# Patient Record
Sex: Female | Born: 1995
Health system: Southern US, Community
[De-identification: ages and names within clinical notes are randomized; demographics above are authoritative.]

## PROBLEM LIST (undated history)

## (undated) ENCOUNTER — Inpatient Hospital Stay (HOSPITAL_COMMUNITY): Payer: Self-pay

## (undated) ENCOUNTER — Ambulatory Visit (HOSPITAL_COMMUNITY): Admission: EM | Payer: No Typology Code available for payment source

## (undated) DIAGNOSIS — D649 Anemia, unspecified: Secondary | ICD-10-CM

## (undated) DIAGNOSIS — R87629 Unspecified abnormal cytological findings in specimens from vagina: Secondary | ICD-10-CM

## (undated) DIAGNOSIS — F329 Major depressive disorder, single episode, unspecified: Secondary | ICD-10-CM

## (undated) DIAGNOSIS — F1910 Other psychoactive substance abuse, uncomplicated: Secondary | ICD-10-CM

## (undated) DIAGNOSIS — F913 Oppositional defiant disorder: Secondary | ICD-10-CM

## (undated) DIAGNOSIS — F4310 Post-traumatic stress disorder, unspecified: Secondary | ICD-10-CM

## (undated) DIAGNOSIS — F419 Anxiety disorder, unspecified: Secondary | ICD-10-CM

## (undated) DIAGNOSIS — J302 Other seasonal allergic rhinitis: Secondary | ICD-10-CM

## (undated) DIAGNOSIS — F32A Depression, unspecified: Secondary | ICD-10-CM

## (undated) DIAGNOSIS — A630 Anogenital (venereal) warts: Secondary | ICD-10-CM

## (undated) HISTORY — DX: Major depressive disorder, single episode, unspecified: F32.9

## (undated) HISTORY — PX: DENTAL SURGERY: SHX609

## (undated) HISTORY — DX: Oppositional defiant disorder: F91.3

## (undated) HISTORY — DX: Unspecified abnormal cytological findings in specimens from vagina: R87.629

## (undated) HISTORY — DX: Anogenital (venereal) warts: A63.0

## (undated) HISTORY — DX: Anxiety disorder, unspecified: F41.9

## (undated) HISTORY — DX: Anemia, unspecified: D64.9

## (undated) HISTORY — DX: Depression, unspecified: F32.A

## (undated) NOTE — *Deleted (*Deleted)
NURSING PROGRESS NOTE  Mahlet Jergens [id number] Admission Data: 07/05/2020 12:54 AM Attending Provider: Earl Lagos, MD WUJ:[id number], Gennie Alma, MD (Inactive) Code Status: Full  Gabrielle Nguyen is a 51 y.o. female patient admitted from ED:  -No acute distress noted.  -No complaints of shortness of breath.  -No complaints of chest pain.   Cardiac Monitoring: Placed on a progressive monitor Cardiac monitor yields:sinus tachycardia.  Blood pressure 116/77, pulse 96, temperature 98.7 F (37.1 C), temperature source Oral, resp. rate (!) 23, height 5\' 7"  (1.702 m), weight 57.2 kg, SpO2 98 %, unknown if currently breastfeeding.   IV Fluids:  IV in place, occlusive dsg intact without redness, IV cath forearm right, condition patent and no redness lactated Ringer's.   Allergies:  Patient has no known allergies.  Past Medical History:   has a past medical history of Anemia, Anxiety, Anxiety, Asthma, Depression, HPV (human papilloma virus) anogenital infection, Oppositional defiant disorder, Seasonal allergies, and Vaginal Pap smear, abnormal.  Past Surgical History:   has a past surgical history that includes Dental surgery.  Social History:   reports that she quit smoking about 7 years ago. She has never used smokeless tobacco. She reports that she does not drink alcohol and does not use drugs.  Skin: intac  Patient/Family orientated to room. Information packet given to patient/family. Admission inpatient armband information verified with patient/family to include name and date of birth and placed on patient arm. Side rails up x 2, fall assessment and education completed with patient/family. Patient/family able to verbalize understanding of risk associated with falls and verbalized understanding to call for assistance before getting out of bed. Call light within reach. Patient/family able to voice and demonstrate understanding of unit orientation instructions.    Will continue to evaluate  and treat per MD orders.

---
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
## 2013-07-04 ENCOUNTER — Encounter (HOSPITAL_COMMUNITY): Payer: Self-pay | Admitting: Emergency Medicine

## 2013-07-04 ENCOUNTER — Emergency Department (HOSPITAL_COMMUNITY): Payer: BC Managed Care – PPO

## 2013-07-04 ENCOUNTER — Emergency Department (HOSPITAL_COMMUNITY)
Admission: EM | Admit: 2013-07-04 | Discharge: 2013-07-04 | Disposition: A | Discharge status: Home / Self Care | Payer: BC Managed Care – PPO | Attending: Emergency Medicine | Admitting: Emergency Medicine

## 2013-07-04 DIAGNOSIS — Y929 Unspecified place or not applicable: Secondary | ICD-10-CM | POA: Insufficient documentation

## 2013-07-04 DIAGNOSIS — Z79899 Other long term (current) drug therapy: Secondary | ICD-10-CM | POA: Insufficient documentation

## 2013-07-04 DIAGNOSIS — Z862 Personal history of diseases of the blood and blood-forming organs and certain disorders involving the immune mechanism: Secondary | ICD-10-CM | POA: Insufficient documentation

## 2013-07-04 DIAGNOSIS — J45909 Unspecified asthma, uncomplicated: Secondary | ICD-10-CM | POA: Insufficient documentation

## 2013-07-04 DIAGNOSIS — X58XXXA Exposure to other specified factors, initial encounter: Secondary | ICD-10-CM | POA: Insufficient documentation

## 2013-07-04 DIAGNOSIS — R5381 Other malaise: Secondary | ICD-10-CM | POA: Insufficient documentation

## 2013-07-04 DIAGNOSIS — IMO0002 Reserved for concepts with insufficient information to code with codable children: Secondary | ICD-10-CM | POA: Insufficient documentation

## 2013-07-04 DIAGNOSIS — R569 Unspecified convulsions: Secondary | ICD-10-CM

## 2013-07-04 DIAGNOSIS — R6883 Chills (without fever): Secondary | ICD-10-CM | POA: Insufficient documentation

## 2013-07-04 DIAGNOSIS — F411 Generalized anxiety disorder: Secondary | ICD-10-CM | POA: Insufficient documentation

## 2013-07-04 DIAGNOSIS — F17200 Nicotine dependence, unspecified, uncomplicated: Secondary | ICD-10-CM | POA: Insufficient documentation

## 2013-07-04 DIAGNOSIS — R209 Unspecified disturbances of skin sensation: Secondary | ICD-10-CM | POA: Insufficient documentation

## 2013-07-04 DIAGNOSIS — R51 Headache: Secondary | ICD-10-CM | POA: Insufficient documentation

## 2013-07-04 DIAGNOSIS — Y939 Activity, unspecified: Secondary | ICD-10-CM | POA: Insufficient documentation

## 2013-07-04 HISTORY — DX: Unspecified convulsions: R56.9

## 2013-07-04 LAB — CBC WITH DIFFERENTIAL/PLATELET
Basophils Absolute: 0.1 10*3/uL (ref 0.0–0.1)
Eosinophils Relative: 3 % (ref 0–5)
Lymphocytes Relative: 14 % (ref 12–46)
Lymphs Abs: 1 10*3/uL (ref 0.7–4.0)
MCV: 75 fL — ABNORMAL LOW (ref 78.0–100.0)
Neutro Abs: 5.7 10*3/uL (ref 1.7–7.7)
Neutrophils Relative %: 75 % (ref 43–77)
Platelets: 187 10*3/uL (ref 150–400)
RBC: 4.64 MIL/uL (ref 3.87–5.11)
WBC: 7.6 10*3/uL (ref 4.0–10.5)

## 2013-07-04 LAB — COMPREHENSIVE METABOLIC PANEL
ALT: 9 U/L (ref 0–35)
AST: 17 U/L (ref 0–37)
Alkaline Phosphatase: 46 U/L (ref 39–117)
CO2: 21 mEq/L (ref 19–32)
Chloride: 103 mEq/L (ref 96–112)
GFR calc Af Amer: >90 mL/min (ref >90)
GFR calc non Af Amer: >90 mL/min (ref >90)
Glucose, Bld: 81 mg/dL (ref 70–99)
Potassium: 3.6 mEq/L (ref 3.5–5.1)
Sodium: 137 mEq/L (ref 135–145)

## 2013-07-04 LAB — URINALYSIS, ROUTINE W REFLEX MICROSCOPIC
Bilirubin Urine: NEGATIVE
Glucose, UA: NEGATIVE mg/dL
Ketones, ur: 15 mg/dL — AB
Nitrite: NEGATIVE
Specific Gravity, Urine: 1.025 (ref 1.005–1.030)
Urobilinogen, UA: 0.2 mg/dL (ref 0.0–1.0)
pH: 5 (ref 5.0–8.0)

## 2013-07-04 LAB — ETHANOL: Alcohol, Ethyl (B): <11 mg/dL (ref 0–11)

## 2013-07-04 LAB — RAPID URINE DRUG SCREEN, HOSP PERFORMED: Barbiturates: NOT DETECTED

## 2013-07-04 LAB — URINE MICROSCOPIC-ADD ON

## 2013-07-04 LAB — PREGNANCY, URINE: Preg Test, Ur: NEGATIVE

## 2013-07-04 MED ORDER — SODIUM CHLORIDE 0.9 % IV BOLUS (SEPSIS)
1000.0000 mL | Freq: Once | INTRAVENOUS | Status: AC
  Administered 2013-07-04: 1000 mL via INTRAVENOUS

## 2013-07-04 MED ORDER — LORAZEPAM 2 MG/ML IJ SOLN
1.0000 mg | Freq: Once | INTRAMUSCULAR | Status: AC
  Administered 2013-07-04: 1 mg via INTRAVENOUS
  Filled 2013-07-04: qty 1

## 2013-07-04 NOTE — ED Notes (Signed)
Discharge instructions reviewed with pt. Pt verbalized understanding.   

## 2013-07-04 NOTE — ED Provider Notes (Signed)
CSN: [id number]     Arrival date & time 07/04/13  1450 History   First MD Initiated Contact with Patient 07/04/13 1458     Chief Complaint  Patient presents with  . Seizures   (Consider location/radiation/quality/duration/timing/severity/associated sxs/prior Treatment) The history is provided by the patient and a parent. No language interpreter was used.  Kessler Kopinski is an 17 year old female with past history of asthma, anxiety, depression presenting to the emergency department with allegedly seizure that occurred this afternoon. Patient was brought in by EMS, mother and father accompanied the patient. As per mother's report, reported that while patient and mother were out getting her nails done the patient reported that she felt to the "weird" and had to sit down. Mother reported that as a child was sitting down, mother reported that she noticed that her child started to get stiff in the body and started to convulse. Mother reported that as she was calling the patient's name the patient was not responding, reported that the patient was looking at the mother but not recognizing her. Mother reported that the convulsions and shakes last for approximately 2 minutes, no more than 3 minutes. Mother reported that patient had a post-ictal phase, reported that patient was disoriented, reported that as the patient was on the gerny she tried to buckle herself up. Patient reported that all she is remembers is going to sit down and then being in the EMS bed. Mother reported that patient is currently in her proper state of mind while in the ED setting, mother reported that patient is more like herself. Patient reported that she experienced a similar episode a couple of months ago where she was seen and assessed at [facility] and diagnosed with an anxiety attack. Patient reported that she does smoke marijuana, but has not smoked in a couple of days. Patient reported that she occasionally drinks beer. Patient reported  that she used to be on klonopin for many years, TID, but reported that she no longer has taken the medication in a couple of weeks - reported that she was taken off of it. Patient reported that she is now experiencing a headache, tingling sensations all over her body, and generalized weakness. Reported mid tongue numbness where she bit her tongue. Denied fall, head injuries, chest pain, shortness of breath, difficulty breathing, visual changes, nausea, vomiting, diarrhea, abdominal pain, fever, chills, sweats, neck pain, neck stiffness, cough, nasal congestion. PCP Dr. Regan Lemming    Mother reported that she had one episode of seizure when she was younger, was never diagnosed with epilepsy.  Past Medical History  Diagnosis Date  . Asthma   . Anxiety   . Oppositional defiant disorder   . Depression   . Seasonal allergies   . Anxiety   . Anemia    History reviewed. No pertinent past surgical history. Family History  Problem Relation Age of Onset  . Bipolar disorder Father   . Alcohol abuse Father   . Drug abuse Father   . Anxiety disorder Maternal Grandmother    History  Substance Use Topics  . Smoking status: Current Every Day Smoker  . Smokeless tobacco: Never Used  . Alcohol Use: 2.4 oz/week    4 Shots of liquor per week   OB History   Grav Para Term Preterm Abortions TAB SAB Ect Mult Living   0 0 0 0 0 0 0 0 0 0      Review of Systems  Constitutional: Positive for chills. Negative for fever.  Eyes:  Negative for visual disturbance.  Respiratory: Negative for chest tightness and shortness of breath.   Cardiovascular: Negative for chest pain.  Gastrointestinal: Negative for nausea, vomiting and abdominal pain.  Musculoskeletal: Negative for back pain, neck pain and neck stiffness.  Neurological: Positive for seizures, weakness and headaches.  All other systems reviewed and are negative.    Allergies  Review of patient's allergies indicates no known allergies.  Home  Medications   Current Outpatient Rx  Name  Route  Sig  Dispense  Refill  . acetaminophen (TYLENOL) 325 MG tablet   Oral   Take 650 mg by mouth every 6 (six) hours as needed. For headache          . albuterol (PROVENTIL HFA;VENTOLIN HFA) 108 (90 BASE) MCG/ACT inhaler   Inhalation   Inhale 2 puffs into the lungs every 6 (six) hours as needed. For shortness of breath          . albuterol (PROVENTIL) (2.5 MG/3ML) 0.083% nebulizer solution   Nebulization   Take 6 mLs (5 mg total) by nebulization every 6 (six) hours as needed for wheezing.   75 mL   12   . clonazePAM (KLONOPIN) 0.5 MG tablet   Oral   Take 0.5 mg by mouth 3 (three) times daily as needed for anxiety.         . diphenhydrAMINE (SOMINEX) 25 MG tablet   Oral   Take 25 mg by mouth at bedtime as needed for itching or sleep.         . drospirenone-ethinyl estradiol (YAZ,GIANVI,LORYNA) 3-0.02 MG tablet   Oral   Take 1 tablet by mouth daily.   1 Package   3   . hydrocortisone cream 1 %   Topical   Apply 1 application topically as needed. For itching/dry skin         . ibuprofen (ADVIL,MOTRIN) 200 MG tablet   Oral   Take 600 mg by mouth every 6 (six) hours as needed. For pain/fever           BP 105/54  Pulse 86  Temp(Src) 98.1 F (36.7 C) (Oral)  Resp 16  SpO2 99%  LMP 06/03/2013 Physical Exam  Nursing note and vitals reviewed. Constitutional: She is oriented to person, place, and time. She appears well-developed and well-nourished. No distress.  HENT:  Head: Normocephalic and atraumatic.  Mouth/Throat: Oropharynx is clear and moist. No oropharyngeal exudate.  Abrasion noted to the left side of tongue, tongue trauma identified  Eyes: Conjunctivae and EOM are normal. Pupils are equal, round, and reactive to light. Right eye exhibits no discharge. Left eye exhibits no discharge.  Neck: Normal range of motion. Neck supple. No tracheal deviation present.  Negative neck stiffness Negative nuchal  rigidity Negative cervical lymphadenopathy Full range of motion without difficulty Negative pain upon palpation to C-spine  Cardiovascular: Normal rate, regular rhythm and normal heart sounds.  Exam reveals no friction rub.   No murmur heard. Pulses:      Radial pulses are 2+ on the right side, and 2+ on the left side.       Dorsalis pedis pulses are 2+ on the right side, and 2+ on the left side.  Pulmonary/Chest: Effort normal and breath sounds normal. No respiratory distress. She has no wheezes. She has no rales.  Abdominal: Soft. Bowel sounds are normal. She exhibits no distension. There is no tenderness. There is no rebound.  Musculoskeletal: Normal range of motion.  Full range of motion to upper  and lower extremities bilaterally with no difficulty identified  Lymphadenopathy:    She has no cervical adenopathy.  Neurological: She is alert and oriented to person, place, and time. No cranial nerve deficit or sensory deficit. She exhibits normal muscle tone. Coordination normal. GCS eye subscore is 4. GCS verbal subscore is 5. GCS motor subscore is 6.  Cranial nerves II through XII grossly intact Strength 5+/5+ to upper and lower extremities bilaterally with resistance applied, equal distribution identified Sensation intact with differentiation to sharp and dull touch to upper and lower extremities bilaterally Follows commands Responds to questions appropriately Patient knows where she is, who she is, you, president, state, city, etc.  Patient is aware as to what went on today  Skin: Skin is warm and dry. No rash noted. She is not diaphoretic. No erythema.  Psychiatric: She has a normal mood and affect. Her behavior is normal. Thought content normal.    ED Course  Procedures (including critical care time)  This provider reviewed patient's chart. There are no charts identifying patient being assessed at [facility] for alleged seizure. Patient has been seen in behavioral health hospital  numerous times.  7:09 PM This provider had a long discussion with the family and patient regarding labs and imaging results. Discussed with patient regarding recreational medications that can lead to seizure-like activity if used can cause complications. Discussed with family regarding calcification on the CT scan - discussed that this is chronic and that this is less likely leading to the cause of the seizure. Discussed with patient to stay away from recreational drugs since she is petite can react with her body and lead to seizures. Recommended patient to follow-up with neurology and for an MRI and EEG to be performed.  Labs Review Labs Reviewed  CBC WITH DIFFERENTIAL - Abnormal; Notable for the following:    Hemoglobin 11.3 (*)    HCT 34.8 (*)    MCV 75.0 (*)    MCH 24.4 (*)    RDW 19.5 (*)    All other components within normal limits  URINE RAPID DRUG SCREEN (HOSP PERFORMED) - Abnormal; Notable for the following:    Benzodiazepines POSITIVE (*)    Amphetamines POSITIVE (*)    Tetrahydrocannabinol POSITIVE (*)    All other components within normal limits  URINALYSIS, ROUTINE W REFLEX MICROSCOPIC - Abnormal; Notable for the following:    APPearance CLOUDY (*)    Ketones, ur 15 (*)    Protein, ur 100 (*)    All other components within normal limits  URINE MICROSCOPIC-ADD ON - Abnormal; Notable for the following:    Squamous Epithelial / LPF MANY (*)    All other components within normal limits  COMPREHENSIVE METABOLIC PANEL  ETHANOL  PREGNANCY, URINE  GLUCOSE, CAPILLARY   Imaging Review No results found.  Results for orders placed during the hospital encounter of 07/04/13  CBC WITH DIFFERENTIAL      Result Value Range   WBC 7.6  4.0 - 10.5 K/uL   RBC 4.64  3.87 - 5.11 MIL/uL   Hemoglobin 11.3 (*) 12.0 - 15.0 g/dL   HCT 16.1 (*) 09.6 - 04.5 %   MCV 75.0 (*) 78.0 - 100.0 fL   MCH 24.4 (*) 26.0 - 34.0 pg   MCHC 32.5  30.0 - 36.0 g/dL   RDW 40.9 (*) 81.1 - 91.4 %   Platelets  187  150 - 400 K/uL   Neutrophils Relative % 75  43 - 77 %  Neutro Abs 5.7  1.7 - 7.7 K/uL   Lymphocytes Relative 14  12 - 46 %   Lymphs Abs 1.0  0.7 - 4.0 K/uL   Monocytes Relative 8  3 - 12 %   Monocytes Absolute 0.6  0.1 - 1.0 K/uL   Eosinophils Relative 3  0 - 5 %   Eosinophils Absolute 0.2  0.0 - 0.7 K/uL   Basophils Relative 1  0 - 1 %   Basophils Absolute 0.1  0.0 - 0.1 K/uL  COMPREHENSIVE METABOLIC PANEL      Result Value Range   Sodium 137  135 - 145 mEq/L   Potassium 3.6  3.5 - 5.1 mEq/L   Chloride 103  96 - 112 mEq/L   CO2 21  19 - 32 mEq/L   Glucose, Bld 81  70 - 99 mg/dL   BUN 14  6 - 23 mg/dL   Creatinine, Ser 4.54  0.50 - 1.10 mg/dL   Calcium 9.7  8.4 - 09.8 mg/dL   Total Protein 7.5  6.0 - 8.3 g/dL   Albumin 4.2  3.5 - 5.2 g/dL   AST 17  0 - 37 U/L   ALT 9  0 - 35 U/L   Alkaline Phosphatase 46  39 - 117 U/L   Total Bilirubin 0.3  0.3 - 1.2 mg/dL   GFR calc non Af Amer >90  >90 mL/min   GFR calc Af Amer >90  >90 mL/min  URINE RAPID DRUG SCREEN (HOSP PERFORMED)      Result Value Range   Opiates NONE DETECTED  NONE DETECTED   Cocaine NONE DETECTED  NONE DETECTED   Benzodiazepines POSITIVE (*) NONE DETECTED   Amphetamines POSITIVE (*) NONE DETECTED   Tetrahydrocannabinol POSITIVE (*) NONE DETECTED   Barbiturates NONE DETECTED  NONE DETECTED  ETHANOL      Result Value Range   Alcohol, Ethyl (B) <11  0 - 11 mg/dL  PREGNANCY, URINE      Result Value Range   Preg Test, Ur NEGATIVE  NEGATIVE  URINALYSIS, ROUTINE W REFLEX MICROSCOPIC      Result Value Range   Color, Urine YELLOW  YELLOW   APPearance CLOUDY (*) CLEAR   Specific Gravity, Urine 1.025  1.005 - 1.030   pH 5.0  5.0 - 8.0   Glucose, UA NEGATIVE  NEGATIVE mg/dL   Hgb urine dipstick NEGATIVE  NEGATIVE   Bilirubin Urine NEGATIVE  NEGATIVE   Ketones, ur 15 (*) NEGATIVE mg/dL   Protein, ur 119 (*) NEGATIVE mg/dL   Urobilinogen, UA 0.2  0.0 - 1.0 mg/dL   Nitrite NEGATIVE  NEGATIVE   Leukocytes, UA  NEGATIVE  NEGATIVE  URINE MICROSCOPIC-ADD ON      Result Value Range   Squamous Epithelial / LPF MANY (*) RARE   Bacteria, UA RARE  RARE  GLUCOSE, CAPILLARY      Result Value Range   Glucose-Capillary 82  70 - 99 mg/dL    EKG Interpretation     Ventricular Rate:  95 PR Interval:  147 QRS Duration: 99 QT Interval:  365 QTC Calculation: 459 R Axis:   -3 Text Interpretation:  Sinus rhythm No Injury No ectopy            MDM   1. Seizures    Patient presenting to the ED with alleged first time seizures. As per mother, patient was fine getting nails done and seizure episode occurred suddenly - where patient was at first stiff and  then had convulsions - as per mother. Mother reported that it lasted no longer than 3 minutes - reported that patient had a post-ictal phase afterwards. Patient reported that she had an episode a little while ago and stated that she was diagnosed as having an anxiety attack. Alert and oriented. Cranial nerves grossly intact. Strength 5+/5+ to upper and lower extremities bilaterally with equal distribution. Full ROM to upper and lower extremities bilaterally. Negative neurological deficits noted. Small abrasion to the right side of the tongue secondary to biting. Eye exam unremarkable.  EKG negative ischemic findings noted. CBG 82. Urine pregnancy negative. Urinalysis negative for infection - negative nitrites, negative leukocytes. CBC negative elevation in WBC - negative leukocytosis. Trend seen in low Hgb and Hct. CMP negative findings. Alcohol negative elevation. Chest xray no cardiopulmonary disease noted. Urine drug screen noted positive benzos, amphetamines, and cannabis. CT head suspected intra-axial left occipital brain calcification - no surrounding edema - possibly old finding. Discussed this with attending physician - attending reported that nothing acute, that okay for patient to be discharged and for patient to be followed with a MRI. Patient did  not have any seizures while in the ED setting. Patient controlled and doing well while in the ED setting. Labs and imaging unremarkable. Patient cleared for discharge. Discharged patient with Neurology follow-up - discussed with patient to get MRI and EEG performed. Discussed with patient to rest and stay hydrated. Discussed with patient to avoid recreational medications/drugs outside for these can stimulate and cause seizures. Discussed with patient to continue to monitor symptoms and if symptoms are to worsen or change to report back to the ED - strict return instructions given. Patient agreed to plan of care, understood, all questions answered.      Raymon Mutton, PA-C 07/07/13 [phone]

## 2013-07-04 NOTE — ED Notes (Signed)
Pt was at salon ans had a seizure that lasted about 2 minutes. Pt sts hx of seizure 3 months ago. Pt post ictal. Was a little confused about where she was. CBG 123. BP 122/70. Pulse 112. 100% on 2 L.

## 2013-07-04 NOTE — ED Notes (Signed)
Return from radiology

## 2013-07-04 NOTE — ED Notes (Signed)
CBG is 82. 

## 2013-07-04 NOTE — ED Notes (Signed)
Pt reports that appx 3 months ago she may have had a seizure. States she was seen in an ED, unsure of where, but recommended to follow up with [facility]. Pt has hx of anxiety, was taking rx Klonopin but was dx from it by PCP dt falsifying a rx. Pt's mom witnessed seizure today. States pt was standing when she became rigid. Mother guided pt to floor where pt started having "jerking" movement to all 4 extremities, tachypnea, and eye deviation. Pt now AO x4, slight drowsy. Injury to tongue noted. PERRLA.

## 2013-07-05 LAB — GLUCOSE, CAPILLARY: Glucose-Capillary: 82 mg/dL (ref 70–99)

## 2013-07-12 NOTE — ED Provider Notes (Signed)
Medical screening examination/treatment/procedure(s) were conducted as a shared visit with non-physician practitioner(s) and myself.  I personally evaluated the patient during the encounter.  EKG Interpretation     Ventricular Rate:  95 PR Interval:  147 QRS Duration: 99 QT Interval:  365 QTC Calculation: 459 R Axis:   -3 Text Interpretation:  Sinus rhythm No Injury No ectopy            Patient seen and evaluated. Has been very eloquently and classically describes a seizure with postictal period. Has normal exam here. Unprovoked seizure. Normal evaluation here. Plan will be seizure precautions. Discussed with her about not driving for clearance. Neurology followup.  All patient and husband's questions were answered.  Roney Marion, MD 07/12/13 [phone]

## 2014-02-08 ENCOUNTER — Emergency Department (HOSPITAL_COMMUNITY): Payer: BC Managed Care – PPO

## 2014-02-08 ENCOUNTER — Encounter (HOSPITAL_COMMUNITY): Payer: Self-pay | Admitting: Emergency Medicine

## 2014-02-08 ENCOUNTER — Emergency Department (HOSPITAL_COMMUNITY)
Admission: EM | Admit: 2014-02-08 | Discharge: 2014-02-08 | Disposition: A | Discharge status: Home / Self Care | Payer: BC Managed Care – PPO | Attending: Emergency Medicine | Admitting: Emergency Medicine

## 2014-02-08 DIAGNOSIS — Z862 Personal history of diseases of the blood and blood-forming organs and certain disorders involving the immune mechanism: Secondary | ICD-10-CM | POA: Insufficient documentation

## 2014-02-08 DIAGNOSIS — J159 Unspecified bacterial pneumonia: Secondary | ICD-10-CM | POA: Insufficient documentation

## 2014-02-08 DIAGNOSIS — F411 Generalized anxiety disorder: Secondary | ICD-10-CM | POA: Insufficient documentation

## 2014-02-08 DIAGNOSIS — J189 Pneumonia, unspecified organism: Secondary | ICD-10-CM

## 2014-02-08 DIAGNOSIS — F329 Major depressive disorder, single episode, unspecified: Secondary | ICD-10-CM | POA: Insufficient documentation

## 2014-02-08 DIAGNOSIS — Z79899 Other long term (current) drug therapy: Secondary | ICD-10-CM | POA: Insufficient documentation

## 2014-02-08 DIAGNOSIS — J45901 Unspecified asthma with (acute) exacerbation: Secondary | ICD-10-CM | POA: Insufficient documentation

## 2014-02-08 DIAGNOSIS — F3289 Other specified depressive episodes: Secondary | ICD-10-CM | POA: Insufficient documentation

## 2014-02-08 DIAGNOSIS — Z87891 Personal history of nicotine dependence: Secondary | ICD-10-CM | POA: Insufficient documentation

## 2014-02-08 DIAGNOSIS — F419 Anxiety disorder, unspecified: Secondary | ICD-10-CM

## 2014-02-08 MED ORDER — IBUPROFEN 200 MG PO TABS
600.0000 mg | ORAL_TABLET | Freq: Once | ORAL | Status: AC
  Administered 2014-02-08: 600 mg via ORAL
  Filled 2014-02-08: qty 3

## 2014-02-08 MED ORDER — ALPRAZOLAM 0.5 MG PO TABS: 0.5000 mg | ORAL_TABLET | Freq: Two times a day (BID) | ORAL | Status: DC | PRN

## 2014-02-08 MED ORDER — AZITHROMYCIN 250 MG PO TABS
250.0000 mg | ORAL_TABLET | Freq: Every day | ORAL | Status: DC
Start: 2014-02-08 — End: 2015-07-11

## 2014-02-08 MED ORDER — ALBUTEROL SULFATE HFA 108 (90 BASE) MCG/ACT IN AERS
2.0000 | INHALATION_SPRAY | Freq: Once | RESPIRATORY_TRACT | Status: AC
  Administered 2014-02-08: 2 via RESPIRATORY_TRACT
  Filled 2014-02-08: qty 6.7

## 2014-02-08 MED ORDER — ALPRAZOLAM 0.25 MG PO TABS
0.5000 mg | ORAL_TABLET | Freq: Once | ORAL | Status: AC
  Administered 2014-02-08: 0.5 mg via ORAL
  Filled 2014-02-08: qty 2

## 2014-02-08 NOTE — ED Provider Notes (Signed)
CSN: [id number]     Arrival date & time 02/08/14  1905 History   First MD Initiated Contact with Patient 02/08/14 1934     Chief Complaint  Patient presents with  . Fever  . Cough     (Consider location/radiation/quality/duration/timing/severity/associated sxs/prior Treatment) HPI 18 year old female presents with cough and shortness of breath over the last 3 days. She's been having rhinorrhea and muscle aches as well. She endorses a fever up to 102 was 2 nights ago. She called EMS and she is having trouble breathing. They gave her 1 albuterol treatment for wheezing. She feels somewhat improved after this. She relates a history of asthma. She's also concerned because she's been very anxious and has not had her Xanax in 3 days. She states she takes 0.5 mg Xanax twice a day. She's been unable to get it because of monetary issues. She endorses some mild tremors but denies seizures. She has intermittent chest pain when she coughs but otherwise no pain.  Past Medical History  Diagnosis Date  . Asthma   . Anxiety   . Oppositional defiant disorder   . Depression   . Seasonal allergies   . Anxiety   . Anemia    History reviewed. No pertinent past surgical history. Family History  Problem Relation Age of Onset  . Bipolar disorder Father   . Alcohol abuse Father   . Drug abuse Father   . Anxiety disorder Maternal Grandmother    History  Substance Use Topics  . Smoking status: Former Smoker    Quit date: 02/08/2013  . Smokeless tobacco: Never Used  . Alcohol Use: No   OB History   Grav Para Term Preterm Abortions TAB SAB Ect Mult Living   0 0 0 0 0 0 0 0 0 0      Review of Systems  Constitutional: Positive for fever and chills.  HENT: Positive for congestion and rhinorrhea.   Respiratory: Positive for cough, shortness of breath and wheezing.   Cardiovascular: Positive for chest pain. Negative for leg swelling.  Gastrointestinal: Negative for vomiting.  Musculoskeletal: Positive  for myalgias.  Neurological: Positive for tremors. Negative for seizures and headaches.  All other systems reviewed and are negative.     Allergies  Review of patient's allergies indicates no known allergies.  Home Medications   Prior to Admission medications   Medication Sig Start Date End Date Taking? Authorizing Provider  acetaminophen (TYLENOL) 325 MG tablet Take 650 mg by mouth every 6 (six) hours as needed. For headache     Historical Provider, MD  albuterol (PROVENTIL HFA;VENTOLIN HFA) 108 (90 BASE) MCG/ACT inhaler Inhale 2 puffs into the lungs every 6 (six) hours as needed. For shortness of breath     Historical Provider, MD  albuterol (PROVENTIL) (2.5 MG/3ML) 0.083% nebulizer solution Take 6 mLs (5 mg total) by nebulization every 6 (six) hours as needed for wheezing. 10/28/12   Shari A Upstill, PA-C  clonazePAM (KLONOPIN) 0.5 MG tablet Take 0.5 mg by mouth 3 (three) times daily as needed for anxiety.    Historical Provider, MD  diphenhydrAMINE (SOMINEX) 25 MG tablet Take 25 mg by mouth at bedtime as needed for itching or sleep.    Historical Provider, MD  drospirenone-ethinyl estradiol (YAZ,GIANVI,LORYNA) 3-0.02 MG tablet Take 1 tablet by mouth daily. 07/15/12   Esmeralda ArthurSandra A Rivard, MD  hydrocortisone cream 1 % Apply 1 application topically as needed. For itching/dry skin    Historical Provider, MD  ibuprofen (ADVIL,MOTRIN) 200 MG tablet Take  600 mg by mouth every 6 (six) hours as needed. For pain/fever     Historical Provider, MD   BP 114/55  Pulse 91  Temp(Src) 98.7 F (37.1 C) (Oral)  Resp 18  Ht 5\' 7"  (1.702 m)  Wt 116 lb (52.617 kg)  BMI 18.16 kg/m2  SpO2 95%  LMP 02/08/2014 Physical Exam  Nursing note and vitals reviewed. Constitutional: She is oriented to person, place, and time. She appears well-developed and well-nourished.  HENT:  Head: Normocephalic and atraumatic.  Right Ear: External ear normal.  Left Ear: External ear normal.  Nose: Nose normal.  Eyes: Right  eye exhibits no discharge. Left eye exhibits no discharge.  Cardiovascular: Normal rate, regular rhythm and normal heart sounds.   Pulmonary/Chest: Effort normal and breath sounds normal. She has no wheezes.  Mild crackles right lower lung field  Abdominal: Soft. There is no tenderness.  Neurological: She is alert and oriented to person, place, and time.  Skin: Skin is warm and dry.    ED Course  Procedures (including critical care time) Labs Review Labs Reviewed - No data to display  Imaging Review Dg Chest 2 View  02/08/2014   CLINICAL DATA:  Fever and cough.  Hemoptysis.  Asthma.  EXAM: CHEST  2 VIEW  COMPARISON:  07/04/2013  FINDINGS: COPD with hyperinflation. New area of patchy density right middle lobe, probable pneumonia. This was not present previously. Followup until clearing suggested. Left lung is clear. No effusion.  Nodular density right lung base most consistent with a nipple shadow  IMPRESSION: Right middle lobe infiltrate, consistent with pneumonia. Follow-up until clearing is recommended.   Electronically Signed   By: Marlan Palau M.D.   On: 02/08/2014 21:04     EKG Interpretation None      MDM   Final diagnoses:  Community acquired pneumonia  Anxiety    Patient given Xanax years she's not had it in 3 days. No signs of withdrawal seizures were clinically significant withdrawal. Her heart rate is normal here. X-ray concerning for early right lower lobe pneumonia. She has no significant wheezing at this time or hypoxia. No tachypnea. I feel she is stable for outpatient treatment with oral antibiotics. She's requesting a refill of her Xanax, I will give her enough for a few days to ensure no withdrawals she states she can followup with her PCP to get more long-term prescription. Discussed return precautions.    Audree Camel, MD 02/09/14 [phone]

## 2014-02-08 NOTE — ED Notes (Signed)
Per EMS- pt present with cough/fever for 3 days with wheezing. Pt was given 1 5mg  albuterol treatment in route. Pt shows ST on monitor. Pt reports fever getting as high as 102.1 yesterday. Denies ay PTA meds.

## 2014-02-08 NOTE — ED Notes (Signed)
MD at bedside. 

## 2014-09-30 ENCOUNTER — Encounter (HOSPITAL_COMMUNITY): Payer: Self-pay | Admitting: *Deleted

## 2014-09-30 ENCOUNTER — Emergency Department (HOSPITAL_COMMUNITY): Payer: BLUE CROSS/BLUE SHIELD

## 2014-09-30 ENCOUNTER — Emergency Department (HOSPITAL_COMMUNITY)
Admission: EM | Admit: 2014-09-30 | Discharge: 2014-10-01 | Disposition: A | Discharge status: Home / Self Care | Payer: BLUE CROSS/BLUE SHIELD | Attending: Emergency Medicine | Admitting: Emergency Medicine

## 2014-09-30 DIAGNOSIS — Z862 Personal history of diseases of the blood and blood-forming organs and certain disorders involving the immune mechanism: Secondary | ICD-10-CM | POA: Insufficient documentation

## 2014-09-30 DIAGNOSIS — Z792 Long term (current) use of antibiotics: Secondary | ICD-10-CM | POA: Diagnosis not present

## 2014-09-30 DIAGNOSIS — G8929 Other chronic pain: Secondary | ICD-10-CM | POA: Insufficient documentation

## 2014-09-30 DIAGNOSIS — X58XXXA Exposure to other specified factors, initial encounter: Secondary | ICD-10-CM | POA: Diagnosis not present

## 2014-09-30 DIAGNOSIS — Z3202 Encounter for pregnancy test, result negative: Secondary | ICD-10-CM | POA: Diagnosis not present

## 2014-09-30 DIAGNOSIS — F329 Major depressive disorder, single episode, unspecified: Secondary | ICD-10-CM | POA: Insufficient documentation

## 2014-09-30 DIAGNOSIS — Y9289 Other specified places as the place of occurrence of the external cause: Secondary | ICD-10-CM | POA: Insufficient documentation

## 2014-09-30 DIAGNOSIS — Z793 Long term (current) use of hormonal contraceptives: Secondary | ICD-10-CM | POA: Diagnosis not present

## 2014-09-30 DIAGNOSIS — F419 Anxiety disorder, unspecified: Secondary | ICD-10-CM | POA: Diagnosis not present

## 2014-09-30 DIAGNOSIS — J45909 Unspecified asthma, uncomplicated: Secondary | ICD-10-CM | POA: Insufficient documentation

## 2014-09-30 DIAGNOSIS — Y998 Other external cause status: Secondary | ICD-10-CM | POA: Diagnosis not present

## 2014-09-30 DIAGNOSIS — Z79899 Other long term (current) drug therapy: Secondary | ICD-10-CM | POA: Insufficient documentation

## 2014-09-30 DIAGNOSIS — T401X1A Poisoning by heroin, accidental (unintentional), initial encounter: Secondary | ICD-10-CM | POA: Insufficient documentation

## 2014-09-30 DIAGNOSIS — Z87891 Personal history of nicotine dependence: Secondary | ICD-10-CM | POA: Diagnosis not present

## 2014-09-30 DIAGNOSIS — Y9389 Activity, other specified: Secondary | ICD-10-CM | POA: Diagnosis not present

## 2014-09-30 LAB — CBC WITH DIFFERENTIAL/PLATELET
BASOS ABS: 0 10*3/uL (ref 0.0–0.1)
Basophils Relative: 0 % (ref 0–1)
EOS PCT: 5 % (ref 0–5)
Eosinophils Absolute: 0.4 10*3/uL (ref 0.0–0.7)
HEMATOCRIT: 32.4 % — AB (ref 36.0–46.0)
Hemoglobin: 10.4 g/dL — ABNORMAL LOW (ref 12.0–15.0)
LYMPHS PCT: 18 % (ref 12–46)
Lymphs Abs: 1.4 10*3/uL (ref 0.7–4.0)
MCH: 25.1 pg — AB (ref 26.0–34.0)
MCHC: 32.1 g/dL (ref 30.0–36.0)
MCV: 78.3 fL (ref 78.0–100.0)
Monocytes Absolute: 0.4 10*3/uL (ref 0.1–1.0)
Monocytes Relative: 5 % (ref 3–12)
NEUTROS ABS: 5.7 10*3/uL (ref 1.7–7.7)
Neutrophils Relative %: 72 % (ref 43–77)
Platelets: 167 10*3/uL (ref 150–400)
RBC: 4.14 MIL/uL (ref 3.87–5.11)
RDW: 17.1 % — AB (ref 11.5–15.5)
WBC: 7.9 10*3/uL (ref 4.0–10.5)

## 2014-09-30 LAB — URINALYSIS, ROUTINE W REFLEX MICROSCOPIC
Bilirubin Urine: NEGATIVE
Glucose, UA: NEGATIVE mg/dL
Hgb urine dipstick: NEGATIVE
KETONES UR: NEGATIVE mg/dL
Leukocytes, UA: NEGATIVE
NITRITE: NEGATIVE
Protein, ur: NEGATIVE mg/dL
SPECIFIC GRAVITY, URINE: 1.009 (ref 1.005–1.030)
UROBILINOGEN UA: 0.2 mg/dL (ref 0.0–1.0)
pH: 5 (ref 5.0–8.0)

## 2014-09-30 LAB — SALICYLATE LEVEL: Salicylate Lvl: <4 mg/dL (ref 2.8–20.0)

## 2014-09-30 LAB — BASIC METABOLIC PANEL
ANION GAP: 7 (ref 5–15)
BUN: 7 mg/dL (ref 6–23)
CO2: 26 mmol/L (ref 19–32)
CREATININE: 0.72 mg/dL (ref 0.50–1.10)
Calcium: 8.9 mg/dL (ref 8.4–10.5)
Chloride: 104 mmol/L (ref 96–112)
GFR calc non Af Amer: >90 mL/min (ref >90)
Glucose, Bld: 65 mg/dL — ABNORMAL LOW (ref 70–99)
POTASSIUM: 3.5 mmol/L (ref 3.5–5.1)
SODIUM: 137 mmol/L (ref 135–145)

## 2014-09-30 LAB — POC URINE PREG, ED: PREG TEST UR: NEGATIVE

## 2014-09-30 LAB — I-STAT CHEM 8, ED
BUN: 6 mg/dL (ref 6–23)
CHLORIDE: 103 mmol/L (ref 96–112)
Calcium, Ion: 1.22 mmol/L (ref 1.12–1.23)
Creatinine, Ser: 0.7 mg/dL (ref 0.50–1.10)
Glucose, Bld: 62 mg/dL — ABNORMAL LOW (ref 70–99)
HCT: 35 % — ABNORMAL LOW (ref 36.0–46.0)
Hemoglobin: 11.9 g/dL — ABNORMAL LOW (ref 12.0–15.0)
Potassium: 3.5 mmol/L (ref 3.5–5.1)
Sodium: 139 mmol/L (ref 135–145)
TCO2: 22 mmol/L (ref 0–100)

## 2014-09-30 LAB — RAPID URINE DRUG SCREEN, HOSP PERFORMED
Amphetamines: NOT DETECTED
BARBITURATES: NOT DETECTED
Benzodiazepines: POSITIVE — AB
COCAINE: NOT DETECTED
OPIATES: POSITIVE — AB
TETRAHYDROCANNABINOL: POSITIVE — AB

## 2014-09-30 LAB — ACETAMINOPHEN LEVEL: Acetaminophen (Tylenol), Serum: <10 ug/mL — ABNORMAL LOW (ref 10–30)

## 2014-09-30 LAB — ETHANOL: Alcohol, Ethyl (B): <5 mg/dL (ref 0–9)

## 2014-09-30 MED ORDER — NALOXONE HCL 0.4 MG/0.4ML IJ SOAJ: 0.4000 mg | INTRAMUSCULAR | Status: DC | PRN

## 2014-09-30 NOTE — ED Notes (Signed)
Pt was found by grandmother slumped over steering wheel.  When EMS arrived pt was apneic but had a strong pulse.  Pt was given 1 mg narcan IN by bls. When ALS arrived pt had rr of 8 and was given 1 mg IV narcan.  Pt now alert and speaking.  Stated snorted heroine for 1st time.

## 2014-09-30 NOTE — ED Notes (Signed)
Patient ambulated to the BR with 2 assist.  While in the BR, "spit" out clear mucous.

## 2014-09-30 NOTE — Discharge Instructions (Signed)
Accidental Overdose  A drug overdose occurs when a chemical substance (drug or medication) is used in amounts large enough to overcome a person. This may result in severe illness or death. This is a type of poisoning. Accidental overdoses of medications or other substances come from a variety of reasons. When this happens accidentally, it is often because the person taking the substance does not know enough about what they have taken. Drugs which commonly cause overdose deaths are alcohol, psychotropic medications (medications which affect the mind), pain medications, illegal drugs (street drugs) such as cocaine and heroin, and multiple drugs taken at the same time. It may result from careless behavior (such as over-indulging at a party). Other causes of overdose may include multiple drug use, a lapse in memory, or drug use after a period of no drug use.   Sometimes overdosing occurs because a person cannot remember if they have taken their medication.   A common unintentional overdose in young children involves multi-vitamins containing iron. Iron is a part of the hemoglobin molecule in blood. It is used to transport oxygen to living cells. When taken in small amounts, iron allows the body to restock hemoglobin. In large amounts, it causes problems in the body. If this overdose is not treated, it can lead to death.  Never take medicines that show signs of tampering or do not seem quite right. Never take medicines in the dark or in poor lighting. Read the label and check each dose of medicine before you take it. When adults are poisoned, it happens most often through carelessness or lack of information. Taking medicines in the dark or taking medicine prescribed for someone else to treat the same type of problem is a dangerous practice.  SYMPTOMS   Symptoms of overdose depend on the medication and amount taken. They can vary from over-activity with stimulant over-dosage, to sleepiness from depressants such as  alcohol, narcotics and tranquilizers. Confusion, dizziness, nausea and vomiting may be present. If problems are severe enough coma and death may result.  DIAGNOSIS   Diagnosis and management are generally straightforward if the drug is known. Otherwise it is more difficult. At times, certain symptoms and signs exhibited by the patient, or blood tests, can reveal the drug in question.   TREATMENT   In an emergency department, most patients can be treated with supportive measures. Antidotes may be available if there has been an overdose of opioids or benzodiazepines. A rapid improvement will often occur if this is the cause of overdose.  At home or away from medical care:   There may be no immediate problems or warning signs in children.   Not everything works well in all cases of poisoning.   Take immediate action. Poisons may act quickly.   If you think someone has swallowed medicine or a household product, and the person is unconscious, having seizures (convulsions), or is not breathing, immediately call for an ambulance.  IF a person is conscious and appears to be doing OK but has swallowed a poison:   Do not wait to see what effect the poison will have. Immediately call a poison control center (listed in the white pages of your telephone book under "Poison Control" or inside the front cover with other emergency numbers). Some poison control centers have TTY capability for the deaf. Check with your local center if you or someone in your family requires this service.   Keep the container so you can read the label on the product for ingredients.     Describe what, when, and how much was taken and the age and condition of the person poisoned. Inform them if the person is vomiting, choking, drowsy, shows a change in color or temperature of skin, is conscious or unconscious, or is convulsing.   Do not cause vomiting unless instructed by medical personnel. Do not induce vomiting or force liquids into a person who  is convulsing, unconscious, or very drowsy.  Stay calm and in control.    Activated charcoal also is sometimes used in certain types of poisoning and you may wish to add a supply to your emergency medicines. It is available without a prescription. Call a poison control center before using this medication.  PREVENTION   Thousands of children die every year from unintentional poisoning. This may be from household chemicals, poisoning from carbon monoxide in a car, taking their parent's medications, or simply taking a few iron pills or vitamins with iron. Poisoning comes from unexpected sources.   Store medicines out of the sight and reach of children, preferably in a locked cabinet. Do not keep medications in a food cabinet. Always store your medicines in a secure place. Get rid of expired medications.   If you have children living with you or have them as occasional guests, you should have child-resistant caps on your medicine containers. Keep everything out of reach. Child proof your home.   If you are called to the telephone or to answer the door while you are taking a medicine, take the container with you or put the medicine out of the reach of small children.   Do not take your medication in front of children. Do not tell your child how good a medication is and how good it is for them. They may get the idea it is more of a treat.   If you are an adult and have accidentally taken an overdose, you need to consider how this happened and what can be done to prevent it from happening again. If this was from a street drug or alcohol, determine if there is a problem that needs addressing. If you are not sure a problems exists, it is easy to talk to a professional and ask them if they think you have a problem. It is better to handle this problem in this way before it happens again and has a much worse consequence.  Document Released: 11/02/2004 Document Revised: 11/11/2011 Document Reviewed: 04/10/2009  [employer]  Patient Information 2015 [employer]. This information is not intended to replace advice given to you by your health care provider. Make sure you discuss any questions you have with your health care provider.

## 2014-10-01 NOTE — ED Provider Notes (Signed)
CSN: [id number]     Arrival date & time 09/30/14  1859 History   First MD Initiated Contact with Patient 09/30/14 1917     Chief Complaint  Patient presents with  . Drug Overdose     (Consider location/radiation/quality/duration/timing/severity/associated sxs/prior Treatment) Patient is a 19 y.o. female presenting with Overdose. The history is provided by the EMS personnel, the patient, a parent, a relative, a friend and the police. No language interpreter was used.  Drug Overdose This is a new problem. The current episode started today. The problem occurs constantly. The problem has been resolved. Pertinent negatives include no anorexia, chest pain, coughing, fatigue, fever, headaches, neck pain, swollen glands, vomiting or weakness. Nothing aggravates the symptoms. Treatments tried: Narcan. The treatment provided significant relief.    Past Medical History  Diagnosis Date  . Asthma   . Anxiety   . Oppositional defiant disorder   . Depression   . Seasonal allergies   . Anxiety   . Anemia    History reviewed. No pertinent past surgical history. Family History  Problem Relation Age of Onset  . Bipolar disorder Father   . Alcohol abuse Father   . Drug abuse Father   . Anxiety disorder Maternal Grandmother    History  Substance Use Topics  . Smoking status: Former Smoker    Quit date: 02/08/2013  . Smokeless tobacco: Never Used  . Alcohol Use: No   OB History    Gravida Para Term Preterm AB TAB SAB Ectopic Multiple Living       Review of Systems  Constitutional: Negative for fever and fatigue.  Respiratory: Negative for cough and shortness of breath.   Cardiovascular: Negative for chest pain and leg swelling.  Gastrointestinal: Negative for vomiting, blood in stool and anorexia.  Genitourinary: Negative for dysuria, urgency and frequency.  Musculoskeletal: Negative for back pain and neck pain.  Neurological: Negative for syncope, weakness and  headaches.       Somnolence  Psychiatric/Behavioral: Negative for confusion, self-injury and agitation. The patient is not hyperactive.   All other systems reviewed and are negative.     Allergies  Review of patient's allergies indicates no known allergies.  Home Medications   Prior to Admission medications   Medication Sig Start Date End Date Taking? Authorizing Provider  albuterol (PROVENTIL HFA;VENTOLIN HFA) 108 (90 BASE) MCG/ACT inhaler Inhale 2 puffs into the lungs every 6 (six) hours as needed. For shortness of breath    Yes Historical Provider, MD  albuterol (PROVENTIL) (2.5 MG/3ML) 0.083% nebulizer solution Take 6 mLs (5 mg total) by nebulization every 6 (six) hours as needed for wheezing. 10/28/12  Yes Shari A Upstill, PA-C  ALPRAZolam (XANAX) 0.5 MG tablet Take 1 tablet (0.5 mg total) by mouth 2 (two) times daily as needed for anxiety. 02/08/14  Yes Audree Camel, MD  escitalopram (LEXAPRO) 20 MG tablet Take 30 mg by mouth daily. 02/24/14  Yes Historical Provider, MD  ibuprofen (ADVIL,MOTRIN) 200 MG tablet Take 600 mg by mouth every 6 (six) hours as needed. For pain/fever    Yes Historical Provider, MD  azithromycin (ZITHROMAX) 250 MG tablet Take 1 tablet (250 mg total) by mouth daily. Take first 2 tablets together, then 1 every day until finished. Patient not taking: Reported on 09/30/2014 02/08/14   Audree Camel, MD  drospirenone-ethinyl estradiol Pierre Bali) 3-0.02 MG tablet Take 1 tablet by mouth daily. Patient not taking: Reported on 09/30/2014 07/15/12  Silverio Lay, MD  Naloxone HCl (EVZIO) 0.4 MG/0.4ML SOAJ Inject 0.4 mg as directed as needed (opioid overdose). 09/30/14   Bethann Berkshire, MD   BP 102/63 mmHg  Pulse 87  Temp(Src) 98 F (36.7 C) (Axillary)  Resp 19  Ht 5\' 6"  (1.676 m)  Wt 123 lb (55.792 kg)  BMI 19.86 kg/m2  SpO2 96%  LMP 09/28/2014 Physical Exam  Constitutional: She is oriented to person, place, and time. She appears well-developed and  well-nourished. She appears lethargic. No distress.  HENT:  Head: Normocephalic and atraumatic.  Eyes: Pupils are equal, round, and reactive to light.  Neck: Normal range of motion.  Cardiovascular: Normal rate, regular rhythm, normal heart sounds and intact distal pulses.   Pulmonary/Chest: Effort normal. No respiratory distress. She has no wheezes. She exhibits no tenderness.  Abdominal: Soft. Bowel sounds are normal. She exhibits no distension. There is no tenderness. There is no rebound and no guarding.  Neurological: She is oriented to person, place, and time. She has normal strength. She appears lethargic. No cranial nerve deficit or sensory deficit. She exhibits normal muscle tone. Coordination and gait normal.  Patient lethargic and easily arouse able.  Moving all extremities equally.  Confused and having difficulty following commands.    Skin: Skin is warm and dry.  Nursing note and vitals reviewed.   ED Course  Procedures (including critical care time) Labs Review Labs Reviewed  ACETAMINOPHEN LEVEL - Abnormal; Notable for the following:    Acetaminophen (Tylenol), Serum <10.0 (*)    All other components within normal limits  URINE RAPID DRUG SCREEN (HOSP PERFORMED) - Abnormal; Notable for the following:    Opiates POSITIVE (*)    Benzodiazepines POSITIVE (*)    Tetrahydrocannabinol POSITIVE (*)    All other components within normal limits  CBC WITH DIFFERENTIAL/PLATELET - Abnormal; Notable for the following:    Hemoglobin 10.4 (*)    HCT 32.4 (*)    MCH 25.1 (*)    RDW 17.1 (*)    All other components within normal limits  BASIC METABOLIC PANEL - Abnormal; Notable for the following:    Glucose, Bld 65 (*)    All other components within normal limits  I-STAT CHEM 8, ED - Abnormal; Notable for the following:    Glucose, Bld 62 (*)    Hemoglobin 11.9 (*)    HCT 35.0 (*)    All other components within normal limits  SALICYLATE LEVEL  ETHANOL  URINALYSIS, ROUTINE W  REFLEX MICROSCOPIC  RAPID HIV SCREEN ([facility])  HEPATITIS PANEL, ACUTE  POC URINE PREG, ED    Imaging Review Dg Chest Portable 1 View  09/30/2014   CLINICAL DATA:  Pt was found by grandmother slumped over steering wheel. When EMS arrived pt was apneic but had a strong pulse. Pt was given 1 mg narcan IN by bls. When ALS arrived pt had rr of 8 and was given 1 mg IV narcan. Pt now alert and speaking. Stated snorted heroine for 1st time. Pt has hx of asthma, anxiety, anemia. Past-Smoker.  EXAM: PORTABLE CHEST - 1 VIEW  COMPARISON:  02/08/2014  FINDINGS: Normal heart, mediastinum hila. Clear lungs. No pleural effusion or pneumothorax. Bony thorax is unremarkable.  IMPRESSION: No active disease.   Electronically Signed   By: Amie Portland M.D.   On: 09/30/2014 19:36     EKG Interpretation None      MDM   Final diagnoses:  Heroin overdose, accidental or unintentional, initial encounter  Patient is a 19 year old Caucasian female with pertinent past medical history of anxiety and chronic pain who comes to the emergency department today after an accidental heroin overdose. Physical exam as above. Patient was treated with Narcan en route to the emergency department with significant improvement in her symptoms. Initial workup included POC pregnancy, UDS, UA, i-STAT Chem-8, Tylenol, salicylate, alcohol, CBC, BMP, chest x-ray, and an EKG. Chest x-ray was unremarkable with no consolidations. As result I doubt post Narcan pulmonary edema. BMP was unremarkable. CBC was unremarkable. Alcohol was negative. Tylenol was negative. Salycilate was negative. UA unremarkable. UDS positive for opiates, benzodiazepines and marijuana. POC pregnancy negative. Patient has a complex social situation with disagreements with her mother and grandmother. The social situation was discussed with the patient and she denied that her overdose was a suicide attempt. She stated that it was an accidental overdose. Patient's grandmother,  mother, best friend and roommate do not have any concerns that this was a suicide attempt at this time. Patient was offered rehabilitation and states that she has been in rehabilitation in the past and does not want to go back at this time. As a result, I feel the patient is stable for discharge at this time she was observed in the emergency department for 3 hours with no apnea or altered mental status. Patient was discharged to the care of her best friend and her grandmother. Labs and imaging reviewed by myself and considered in medical decision-making. Imaging was interpreted by radiology. Care was discussed with my attending Dr. Radford Pax.     Bethann Berkshire, MD 10/01/14 1504  Nelia Shi, MD 10/02/14 [phone]

## 2015-05-23 LAB — OB RESULTS CONSOLE ABO/RH: RH TYPE: POSITIVE

## 2015-05-23 LAB — OB RESULTS CONSOLE GC/CHLAMYDIA
Chlamydia: NEGATIVE
Gonorrhea: NEGATIVE

## 2015-05-23 LAB — OB RESULTS CONSOLE ANTIBODY SCREEN: Antibody Screen: NEGATIVE

## 2015-05-23 LAB — OB RESULTS CONSOLE HEPATITIS B SURFACE ANTIGEN: Hepatitis B Surface Ag: NEGATIVE

## 2015-05-23 LAB — OB RESULTS CONSOLE RUBELLA ANTIBODY, IGM: RUBELLA: IMMUNE

## 2015-05-23 LAB — OB RESULTS CONSOLE RPR: RPR: NONREACTIVE

## 2015-05-23 LAB — OB RESULTS CONSOLE HIV ANTIBODY (ROUTINE TESTING): HIV: NONREACTIVE

## 2015-07-11 ENCOUNTER — Inpatient Hospital Stay (HOSPITAL_COMMUNITY)
Admission: AD | Admit: 2015-07-11 | Discharge: 2015-07-11 | Disposition: A | Discharge status: Home / Self Care | Payer: BLUE CROSS/BLUE SHIELD | Source: Ambulatory Visit | Attending: Obstetrics and Gynecology | Admitting: Obstetrics and Gynecology

## 2015-07-11 ENCOUNTER — Encounter (HOSPITAL_COMMUNITY): Payer: Self-pay

## 2015-07-11 DIAGNOSIS — O26892 Other specified pregnancy related conditions, second trimester: Secondary | ICD-10-CM | POA: Diagnosis not present

## 2015-07-11 DIAGNOSIS — Z87891 Personal history of nicotine dependence: Secondary | ICD-10-CM | POA: Diagnosis not present

## 2015-07-11 DIAGNOSIS — Z3A22 22 weeks gestation of pregnancy: Secondary | ICD-10-CM | POA: Insufficient documentation

## 2015-07-11 DIAGNOSIS — O4692 Antepartum hemorrhage, unspecified, second trimester: Secondary | ICD-10-CM | POA: Diagnosis present

## 2015-07-11 DIAGNOSIS — K625 Hemorrhage of anus and rectum: Secondary | ICD-10-CM | POA: Insufficient documentation

## 2015-07-11 LAB — URINALYSIS, ROUTINE W REFLEX MICROSCOPIC
Bilirubin Urine: NEGATIVE
Glucose, UA: NEGATIVE mg/dL
Hgb urine dipstick: NEGATIVE
Ketones, ur: NEGATIVE mg/dL
Leukocytes, UA: NEGATIVE
NITRITE: NEGATIVE
Protein, ur: NEGATIVE mg/dL
UROBILINOGEN UA: 0.2 mg/dL (ref 0.0–1.0)
pH: 6 (ref 5.0–8.0)

## 2015-07-11 LAB — WET PREP, GENITAL
Clue Cells Wet Prep HPF POC: NONE SEEN
TRICH WET PREP: NONE SEEN
YEAST WET PREP: NONE SEEN

## 2015-07-11 LAB — CBC
HEMATOCRIT: 28 % — AB (ref 36.0–46.0)
HEMOGLOBIN: 9 g/dL — AB (ref 12.0–15.0)
MCH: 26.9 pg (ref 26.0–34.0)
MCHC: 32.1 g/dL (ref 30.0–36.0)
MCV: 83.8 fL (ref 78.0–100.0)
Platelets: 188 10*3/uL (ref 150–400)
RBC: 3.34 MIL/uL — ABNORMAL LOW (ref 3.87–5.11)
RDW: 16.7 % — AB (ref 11.5–15.5)
WBC: 9.1 10*3/uL (ref 4.0–10.5)

## 2015-07-11 NOTE — MAU Provider Note (Signed)
History     CSN: [id number]  Arrival date and time: 07/11/15 2050   First Provider Initiated Contact with Patient 07/11/15 2133         Chief Complaint  Patient presents with  . Vaginal Bleeding   HPI  Gabrielle Nguyen is a 19 y.o. G1P0000 at [redacted]w[redacted]d who presents for vaginal bleeding.  States had bowel movement tonight at 830 pm & noticed bright red blood in toilet & on toilet paper. Unsure if it was from vagina or rectum. Denies constipation or diarrhea. States it was a normal BM & she did not have to strain.  Some abdominal pressure immediately prior to BM. Denies abdominal pain now.  Some increase in thick, white vaginal discharge today. Denies itching, odor, or irritation.  Positive fetal movement.  Denies LOF.  Denies complications with this pregnancy.  Last intercourse 2 days ago.   OB History    Gravida Para Term Preterm AB TAB SAB Ectopic Multiple Living        Past Medical History  Diagnosis Date  . Asthma   . Anxiety   . Oppositional defiant disorder   . Depression   . Seasonal allergies   . Anxiety   . Anemia     Past Surgical History  Procedure Laterality Date  . Dental surgery      Family History  Problem Relation Age of Onset  . Bipolar disorder Father   . Alcohol abuse Father   . Drug abuse Father   . Anxiety disorder Maternal Grandmother     Social History  Substance Use Topics  . Smoking status: Former Smoker    Quit date: 02/08/2013  . Smokeless tobacco: Never Used  . Alcohol Use: No    Allergies: No Known Allergies  Prescriptions prior to admission  Medication Sig Dispense Refill Last Dose  . albuterol (PROVENTIL HFA;VENTOLIN HFA) 108 (90 BASE) MCG/ACT inhaler Inhale 2 puffs into the lungs every 6 (six) hours as needed. For shortness of breath    07/10/2015 at Unknown time  . albuterol (PROVENTIL) (2.5 MG/3ML) 0.083% nebulizer solution Take 6 mLs (5 mg total) by nebulization every 6 (six) hours as needed for  wheezing. 75 mL 12 Past Week at Unknown time  . ALPRAZolam (XANAX) 0.5 MG tablet Take 1 tablet (0.5 mg total) by mouth 2 (two) times daily as needed for anxiety. 10 tablet 0 Past Month at Unknown time  . ondansetron (ZOFRAN) 8 MG tablet Take by mouth every 8 (eight) hours as needed for nausea or vomiting.   07/10/2015 at Unknown time  . escitalopram (LEXAPRO) 20 MG tablet Take 30 mg by mouth daily.   09/30/2014 at Unknown time  . Naloxone HCl (EVZIO) 0.4 MG/0.4ML SOAJ Inject 0.4 mg as directed as needed (opioid overdose). 0.4 mL 0   . Prenatal Vit-Fe Fumarate-FA (PREPLUS) 27-1 MG TABS TK 1 T PO  QD  1   . [DISCONTINUED] azithromycin (ZITHROMAX) 250 MG tablet Take 1 tablet (250 mg total) by mouth daily. Take first 2 tablets together, then 1 every day until finished. (Patient not taking: Reported on 09/30/2014) 6 tablet 0 Not Taking at Unknown time  . [DISCONTINUED] drospirenone-ethinyl estradiol (YAZ,GIANVI,LORYNA) 3-0.02 MG tablet Take 1 tablet by mouth daily. (Patient not taking: Reported on 09/30/2014) 1 Package 3 Not Taking at Unknown time  . [DISCONTINUED] ibuprofen (ADVIL,MOTRIN) 200 MG tablet Take 600 mg by mouth every 6 (six) hours as needed. For pain/fever  Past Month at Unknown time    Review of Systems  Constitutional: Negative.   Gastrointestinal: Positive for blood in stool (? unsure). Negative for nausea, vomiting, abdominal pain, diarrhea and constipation.  Genitourinary: Negative for dysuria.       Vaginal bleeding vs rectal bleeding   Physical Exam   Blood pressure 103/61, pulse 82, temperature 97.8 F (36.6 C), temperature source Oral, resp. rate 18, height 5\' 5"  (1.651 m), weight 134 lb 12.8 oz (61.145 kg), last menstrual period 09/28/2014, SpO2 100 %.  Physical Exam  Nursing note and vitals reviewed. Constitutional: She is oriented to person, place, and time. She appears well-developed and well-nourished. No distress.  HENT:  Head: Normocephalic and atraumatic.  Eyes:  Conjunctivae are normal. Right eye exhibits no discharge. Left eye exhibits no discharge. No scleral icterus.  Neck: Normal range of motion.  Cardiovascular: Normal rate, regular rhythm and normal heart sounds.   No murmur heard. Respiratory: Effort normal and breath sounds normal. No respiratory distress. She has no wheezes.  GI: Soft. Bowel sounds are normal. She exhibits distension (appropriate for gestation). There is no tenderness.  Genitourinary: Vagina normal. Rectal exam shows no external hemorrhoid and no tenderness. Cervix exhibits discharge (moderate amount of thick white discharge). Cervix exhibits no motion tenderness and no friability.  Cervix closed No blood in vaginal canal Small amount of blood on digital rectal exam  Neurological: She is alert and oriented to person, place, and time.  Skin: Skin is warm and dry. She is not diaphoretic.  Psychiatric: She has a normal mood and affect. Her behavior is normal. Judgment and thought content normal.   FHT 145 per doppler   Dilation: Closed Effacement (%): Thick Cervical Position: [place]: -2 Exam by:: Judeth Horn NP  MAU Course  Procedures Results for orders placed or performed during the hospital encounter of 07/11/15 (from the past 24 hour(s))  Urinalysis, Routine w reflex microscopic (not at [facility])     Status: Abnormal   Collection Time: 07/11/15  9:12 PM  Result Value Ref Range   Color, Urine YELLOW YELLOW   APPearance CLEAR CLEAR   Specific Gravity, Urine >1.030 (H) 1.005 - 1.030   pH 6.0 5.0 - 8.0   Glucose, UA NEGATIVE NEGATIVE mg/dL   Hgb urine dipstick NEGATIVE NEGATIVE   Bilirubin Urine NEGATIVE NEGATIVE   Ketones, ur NEGATIVE NEGATIVE mg/dL   Protein, ur NEGATIVE NEGATIVE mg/dL   Urobilinogen, UA 0.2 0.0 - 1.0 mg/dL   Nitrite NEGATIVE NEGATIVE   Leukocytes, UA NEGATIVE NEGATIVE  CBC     Status: Abnormal   Collection Time: 07/11/15  9:53 PM  Result Value Ref Range   WBC 9.1 4.0 - 10.5 K/uL    RBC 3.34 (L) 3.87 - 5.11 MIL/uL   Hemoglobin 9.0 (L) 12.0 - 15.0 g/dL   HCT 79.4 (L) 80.1 - 65.5 %   MCV 83.8 78.0 - 100.0 fL   MCH 26.9 26.0 - 34.0 pg   MCHC 32.1 30.0 - 36.0 g/dL   RDW 37.4 (H) 82.7 - 07.8 %   Platelets 188 150 - 400 K/uL  Wet prep, genital     Status: Abnormal   Collection Time: 07/11/15 10:01 PM  Result Value Ref Range   Yeast Wet Prep HPF POC NONE SEEN NONE SEEN   Trich, Wet Prep NONE SEEN NONE SEEN   Clue Cells Wet Prep HPF POC NONE SEEN NONE SEEN   WBC, Wet Prep HPF POC FEW (A) NONE SEEN  MDM Per patient she is AB negative & received "shot for blood type" at beginning of pregnancy; per prenatal record pt is A positive. Will collect ABO/RH to confirm FHT 145 per doppler Rectal bleeding noted on digital rectal exam. In absence of pain or diarrhea; likely cause is internal hemorrhoid.  2215- S/w Dr. Arelia Sneddon. Notified of exam & rectal bleeding. Ok to discharge home Assessment and Plan  A: 1. Rectal bleeding    P: Discharge home Discussed reasons to return to MAU Increase fiber & fluid intake If rectal bleeding continues or worsen go to PCP or ED  Judeth Horn, NP  07/11/2015, 9:33 PM

## 2015-07-11 NOTE — MAU Note (Signed)
Pt states that she started bleeding after a bowel movement about an hour ago. Thinks that it may have been vaginal bleeding but unsure. Pt not wearing a pad now. Having an increase amount of white, thick discharge but no odor and does not it. Having some pressure, but no pain. +FM.

## 2015-07-11 NOTE — Discharge Instructions (Signed)
Gastrointestinal Bleeding Gastrointestinal bleeding is bleeding somewhere along the path that food travels through the body (digestive tract). This path is anywhere between the mouth and the opening of the butt (anus). You may have blood in your throw up (vomit) or in your poop (stools). If there is a lot of bleeding, you may need to stay in the hospital. HOME CARE  Only take medicine as told by your doctor.  Eat foods with fiber such as whole grains, fruits, and vegetables. You can also try eating 1 to 3 prunes a day.  Drink enough fluids to keep your pee (urine) clear or pale yellow. GET HELP RIGHT AWAY IF:   Your bleeding gets worse.  You feel dizzy, weak, or you pass out (faint).  You have bad cramps in your back or belly (abdomen).  You have large blood clumps (clots) in your poop.  Your problems are getting worse. MAKE SURE YOU:   Understand these instructions.  Will watch your condition.  Will get help right away if you are not doing well or get worse.   This information is not intended to replace advice given to you by your health care provider. Make sure you discuss any questions you have with your health care provider.   Document Released: 05/28/2008 Document Revised: 08/05/2012 Document Reviewed: 02/06/2015 Elsevier Interactive Patient Education 2016 [employer].     Preterm Labor Information Preterm labor is when labor starts before you are [redacted] weeks pregnant. The normal length of pregnancy is 39 to 41 weeks.  CAUSES  The cause of preterm labor is not often known. The most common known cause is infection. RISK FACTORS  Having a history of preterm labor.  Having your water break before it should.  Having a placenta that covers the opening of the cervix.  Having a placenta that breaks away from the uterus.  Having a cervix that is too weak to hold the baby in the uterus.  Having too much fluid in the amniotic sac.  Taking drugs or smoking while  pregnant.  Not gaining enough weight while pregnant.  Being younger than 43 and older than 21 years old.  Having a low income.  Being African American. SYMPTOMS  Period-like cramps, belly (abdominal) pain, or back pain.  Contractions that are regular, as often as six in an hour. They may be mild or painful.  Contractions that start at the top of the belly. They then move to the lower belly and back.  Lower belly pressure that seems to get stronger.  Bleeding from the vagina.  Fluid leaking from the vagina. TREATMENT  Treatment depends on:  Your condition.  The condition of your baby.  How many weeks pregnant you are. Your doctor may have you:  Take medicine to stop contractions.  Stay in bed except to use the restroom (bed rest).  Stay in the hospital. WHAT SHOULD YOU DO IF YOU THINK YOU ARE IN PRETERM LABOR? Call your doctor right away. You need to go to the hospital right away.  HOW CAN YOU PREVENT PRETERM LABOR IN FUTURE PREGNANCIES?  Stop smoking, if you smoke.  Maintain healthy weight gain.  Do not take drugs or be around chemicals that are not needed.  Tell your doctor if you think you have an infection.  Tell your doctor if you had a preterm labor before.   This information is not intended to replace advice given to you by your health care provider. Make sure you discuss any questions you  have with your health care provider.   Document Released: 11/15/2008 Document Revised: 01/03/2015 Document Reviewed: 09/21/2012 Elsevier Interactive Patient Education [employer]

## 2015-07-12 LAB — ABO/RH: ABO/RH(D): A POS

## 2015-09-03 NOTE — L&D Delivery Note (Signed)
Delivery Note  SVD viable female Apgars 9,9 over 2nd degree ML laceration.  Placenta delivered spontaneously intact with 3VC. Repair with 2-0 Chromic with good support and hemostasis noted and R/V exam confirms.  PH art was sent.  [place] cord blood was not done.  Mother and baby were doing well.  EBL 200c  Candice Camp, MD

## 2015-10-27 ENCOUNTER — Encounter (HOSPITAL_COMMUNITY): Payer: Self-pay

## 2015-10-27 ENCOUNTER — Inpatient Hospital Stay (HOSPITAL_COMMUNITY)
Admission: EM | Admit: 2015-10-27 | Discharge: 2015-10-28 | Disposition: A | Discharge status: Home / Self Care | Payer: BLUE CROSS/BLUE SHIELD | Source: Ambulatory Visit | Attending: Obstetrics and Gynecology | Admitting: Obstetrics and Gynecology

## 2015-10-27 DIAGNOSIS — Z3493 Encounter for supervision of normal pregnancy, unspecified, third trimester: Secondary | ICD-10-CM | POA: Diagnosis not present

## 2015-10-27 LAB — AMNISURE RUPTURE OF MEMBRANE (ROM) NOT AT ARMC: AMNISURE: NEGATIVE

## 2015-10-27 NOTE — MAU Note (Signed)
Pt presents complaining of contractions and possible SROM at 1020. Denies bleeding. States she had a big gush of clear fluid. Reports good fetal movement.

## 2015-10-28 NOTE — Progress Notes (Signed)
Notified of pt arrival in MAU and exam with negative amnisure. Will discharge home with reassurance

## 2015-10-28 NOTE — Discharge Instructions (Signed)
Third Trimester of Pregnancy °The third trimester is from week 29 through week 42, months 7 through 9. The third trimester is a time when the fetus is growing rapidly. At the end of the ninth month, the fetus is about 20 inches in length and weighs 6-10 pounds.  °BODY CHANGES °Your body goes through many changes during pregnancy. The changes vary from woman to woman.  °· Your weight will continue to increase. You can expect to gain 25-35 pounds (11-16 kg) by the end of the pregnancy. °· You may begin to get stretch marks on your hips, abdomen, and breasts. °· You may urinate more often because the fetus is moving lower into your pelvis and pressing on your bladder. °· You may develop or continue to have heartburn as a result of your pregnancy. °· You may develop constipation because certain hormones are causing the muscles that push waste through your intestines to slow down. °· You may develop hemorrhoids or swollen, bulging veins (varicose veins). °· You may have pelvic pain because of the weight gain and pregnancy hormones relaxing your joints between the bones in your pelvis. Backaches may result from overexertion of the muscles supporting your posture. °· You may have changes in your hair. These can include thickening of your hair, rapid growth, and changes in texture. Some women also have hair loss during or after pregnancy, or hair that feels dry or thin. Your hair will most likely return to normal after your baby is born. °· Your breasts will continue to grow and be tender. A yellow discharge may leak from your breasts called colostrum. °· Your belly button may stick out. °· You may feel short of breath because of your expanding uterus. °· You may notice the fetus "dropping," or moving lower in your abdomen. °· You may have a bloody mucus discharge. This usually occurs a few days to a week before labor begins. °· Your cervix becomes thin and soft (effaced) near your due date. °WHAT TO EXPECT AT YOUR PRENATAL  EXAMS  °You will have prenatal exams every 2 weeks until week 36. Then, you will have weekly prenatal exams. During a routine prenatal visit: °· You will be weighed to make sure you and the fetus are growing normally. °· Your blood pressure is taken. °· Your abdomen will be measured to track your baby's growth. °· The fetal heartbeat will be listened to. °· Any test results from the previous visit will be discussed. °· You may have a cervical check near your due date to see if you have effaced. °At around 36 weeks, your caregiver will check your cervix. At the same time, your caregiver will also perform a test on the secretions of the vaginal tissue. This test is to determine if a type of bacteria, Group B streptococcus, is present. Your caregiver will explain this further. °Your caregiver may ask you: °· What your birth plan is. °· How you are feeling. °· If you are feeling the baby move. °· If you have had any abnormal symptoms, such as leaking fluid, bleeding, severe headaches, or abdominal cramping. °· If you are using any tobacco products, including cigarettes, chewing tobacco, and electronic cigarettes. °· If you have any questions. °Other tests or screenings that may be performed during your third trimester include: °· Blood tests that check for low iron levels (anemia). °· Fetal testing to check the health, activity level, and growth of the fetus. Testing is done if you have certain medical conditions or if   there are problems during the pregnancy. °· HIV (human immunodeficiency virus) testing. If you are at high risk, you may be screened for HIV during your third trimester of pregnancy. °FALSE LABOR °You may feel small, irregular contractions that eventually go away. These are called Braxton Hicks contractions, or false labor. Contractions may last for hours, days, or even weeks before true labor sets in. If contractions come at regular intervals, intensify, or become painful, it is best to be seen by your  caregiver.  °SIGNS OF LABOR  °· Menstrual-like cramps. °· Contractions that are 5 minutes apart or less. °· Contractions that start on the top of the uterus and spread down to the lower abdomen and back. °· A sense of increased pelvic pressure or back pain. °· A watery or bloody mucus discharge that comes from the vagina. °If you have any of these signs before the 37th week of pregnancy, call your caregiver right away. You need to go to the hospital to get checked immediately. °HOME CARE INSTRUCTIONS  °· Avoid all smoking, herbs, alcohol, and unprescribed drugs. These chemicals affect the formation and growth of the baby. °· Do not use any tobacco products, including cigarettes, chewing tobacco, and electronic cigarettes. If you need help quitting, ask your health care provider. You may receive counseling support and other resources to help you quit. °· Follow your caregiver's instructions regarding medicine use. There are medicines that are either safe or unsafe to take during pregnancy. °· Exercise only as directed by your caregiver. Experiencing uterine cramps is a good sign to stop exercising. °· Continue to eat regular, healthy meals. °· Wear a good support bra for breast tenderness. °· Do not use hot tubs, steam rooms, or saunas. °· Wear your seat belt at all times when driving. °· Avoid raw meat, uncooked cheese, cat litter boxes, and soil used by cats. These carry germs that can cause birth defects in the baby. °· Take your prenatal vitamins. °· Take 1500-2000 mg of calcium daily starting at the 20th week of pregnancy until you deliver your baby. °· Try taking a stool softener (if your caregiver approves) if you develop constipation. Eat more high-fiber foods, such as fresh vegetables or fruit and whole grains. Drink plenty of fluids to keep your urine clear or pale yellow. °· Take warm sitz baths to soothe any pain or discomfort caused by hemorrhoids. Use hemorrhoid cream if your caregiver approves. °· If  you develop varicose veins, wear support hose. Elevate your feet for 15 minutes, 3-4 times a day. Limit salt in your diet. °· Avoid heavy lifting, wear low heal shoes, and practice good posture. °· Rest a lot with your legs elevated if you have leg cramps or low back pain. °· Visit your dentist if you have not gone during your pregnancy. Use a soft toothbrush to brush your teeth and be gentle when you floss. °· A sexual relationship may be continued unless your caregiver directs you otherwise. °· Do not travel far distances unless it is absolutely necessary and only with the approval of your caregiver. °· Take prenatal classes to understand, practice, and ask questions about the labor and delivery. °· Make a trial run to the hospital. °· Pack your hospital bag. °· Prepare the baby's nursery. °· Continue to go to all your prenatal visits as directed by your caregiver. °SEEK MEDICAL CARE IF: °· You are unsure if you are in labor or if your water has broken. °· You have dizziness. °· You have   mild pelvic cramps, pelvic pressure, or nagging pain in your abdominal area. °· You have persistent nausea, vomiting, or diarrhea. °· You have a bad smelling vaginal discharge. °· You have pain with urination. °SEEK IMMEDIATE MEDICAL CARE IF:  °· You have a fever. °· You are leaking fluid from your vagina. °· You have spotting or bleeding from your vagina. °· You have severe abdominal cramping or pain. °· You have rapid weight loss or gain. °· You have shortness of breath with chest pain. °· You notice sudden or extreme swelling of your face, hands, ankles, feet, or legs. °· You have not felt your baby move in over an hour. °· You have severe headaches that do not go away with medicine. °· You have vision changes. °  °This information is not intended to replace advice given to you by your health care provider. Make sure you discuss any questions you have with your health care provider. °  °Document Released: 08/13/2001 Document  Revised: 09/09/2014 Document Reviewed: 10/20/2012 °[employer] Interactive Patient Education ©2016 [employer] °Fetal Movement Counts °Patient Name: __________________________________________________ Patient Due Date: ____________________ °Performing a fetal movement count is highly recommended in high-risk pregnancies, but it is good for every pregnant woman to do. Your health care provider may ask you to start counting fetal movements at 28 weeks of the pregnancy. Fetal movements often increase: °· After eating a full meal. °· After physical activity. °· After eating or drinking something sweet or cold. °· At rest. °Pay attention to when you feel the baby is most active. This will help you notice a pattern of your baby's sleep and wake cycles and what factors contribute to an increase in fetal movement. It is important to perform a fetal movement count at the same time each day when your baby is normally most active.  °HOW TO COUNT FETAL MOVEMENTS °· Find a quiet and comfortable area to sit or lie down on your left side. Lying on your left side provides the best blood and oxygen circulation to your baby. °· Write down the day and time on a sheet of paper or in a journal. °· Start counting kicks, flutters, swishes, rolls, or jabs in a 2-hour period. You should feel at least 10 movements within 2 hours. °· If you do not feel 10 movements in 2 hours, wait 2-3 hours and count again. Look for a change in the pattern or not enough counts in 2 hours. °SEEK MEDICAL CARE IF: °· You feel less than 10 counts in 2 hours, tried twice. °· There is no movement in over an hour. °· The pattern is changing or taking longer each day to reach 10 counts in 2 hours. °· You feel the baby is not moving as he or she usually does. °Date: ____________ Movements: ____________ Start time: ____________ Finish time: ____________  °Date: ____________ Movements: ____________ Start time: ____________ Finish time: ____________ °Date: ____________  Movements: ____________ Start time: ____________ Finish time: ____________ °Date: ____________ Movements: ____________ Start time: ____________ Finish time: ____________ °Date: ____________ Movements: ____________ Start time: ____________ Finish time: ____________ °Date: ____________ Movements: ____________ Start time: ____________ Finish time: ____________ °Date: ____________ Movements: ____________ Start time: ____________ Finish time: ____________ °Date: ____________ Movements: ____________ Start time: ____________ Finish time: ____________  °Date: ____________ Movements: ____________ Start time: ____________ Finish time: ____________ °Date: ____________ Movements: ____________ Start time: ____________ Finish time: ____________ °Date: ____________ Movements: ____________ Start time: ____________ Finish time: ____________ °Date: ____________ Movements: ____________ Start time: ____________ Finish time: ____________ °Date:   ____________ Movements: ____________ Start time: ____________ Finish time: ____________ °Date: ____________ Movements: ____________ Start time: ____________ Finish time: ____________ °Date: ____________ Movements: ____________ Start time: ____________ Finish time: ____________  °Date: ____________ Movements: ____________ Start time: ____________ Finish time: ____________ °Date: ____________ Movements: ____________ Start time: ____________ Finish time: ____________ °Date: ____________ Movements: ____________ Start time: ____________ Finish time: ____________ °Date: ____________ Movements: ____________ Start time: ____________ Finish time: ____________ °Date: ____________ Movements: ____________ Start time: ____________ Finish time: ____________ °Date: ____________ Movements: ____________ Start time: ____________ Finish time: ____________ °Date: ____________ Movements: ____________ Start time: ____________ Finish time: ____________  °Date: ____________ Movements: ____________ Start time:  ____________ Finish time: ____________ °Date: ____________ Movements: ____________ Start time: ____________ Finish time: ____________ °Date: ____________ Movements: ____________ Start time: ____________ Finish time: ____________ °Date: ____________ Movements: ____________ Start time: ____________ Finish time: ____________ °Date: ____________ Movements: ____________ Start time: ____________ Finish time: ____________ °Date: ____________ Movements: ____________ Start time: ____________ Finish time: ____________ °Date: ____________ Movements: ____________ Start time: ____________ Finish time: ____________  °Date: ____________ Movements: ____________ Start time: ____________ Finish time: ____________ °Date: ____________ Movements: ____________ Start time: ____________ Finish time: ____________ °Date: ____________ Movements: ____________ Start time: ____________ Finish time: ____________ °Date: ____________ Movements: ____________ Start time: ____________ Finish time: ____________ °Date: ____________ Movements: ____________ Start time: ____________ Finish time: ____________ °Date: ____________ Movements: ____________ Start time: ____________ Finish time: ____________ °Date: ____________ Movements: ____________ Start time: ____________ Finish time: ____________  °Date: ____________ Movements: ____________ Start time: ____________ Finish time: ____________ °Date: ____________ Movements: ____________ Start time: ____________ Finish time: ____________ °Date: ____________ Movements: ____________ Start time: ____________ Finish time: ____________ °Date: ____________ Movements: ____________ Start time: ____________ Finish time: ____________ °Date: ____________ Movements: ____________ Start time: ____________ Finish time: ____________ °Date: ____________ Movements: ____________ Start time: ____________ Finish time: ____________ °Date: ____________ Movements: ____________ Start time: ____________ Finish time: ____________  °Date:  ____________ Movements: ____________ Start time: ____________ Finish time: ____________ °Date: ____________ Movements: ____________ Start time: ____________ Finish time: ____________ °Date: ____________ Movements: ____________ Start time: ____________ Finish time: ____________ °Date: ____________ Movements: ____________ Start time: ____________ Finish time: ____________ °Date: ____________ Movements: ____________ Start time: ____________ Finish time: ____________ °Date: ____________ Movements: ____________ Start time: ____________ Finish time: ____________ °Date: ____________ Movements: ____________ Start time: ____________ Finish time: ____________  °Date: ____________ Movements: ____________ Start time: ____________ Finish time: ____________ °Date: ____________ Movements: ____________ Start time: ____________ Finish time: ____________ °Date: ____________ Movements: ____________ Start time: ____________ Finish time: ____________ °Date: ____________ Movements: ____________ Start time: ____________ Finish time: ____________ °Date: ____________ Movements: ____________ Start time: ____________ Finish time: ____________ °Date: ____________ Movements: ____________ Start time: ____________ Finish time: ____________ °  °This information is not intended to replace advice given to you by your health care provider. Make sure you discuss any questions you have with your health care provider. °  °Document Released: 09/18/2006 Document Revised: 09/09/2014 Document Reviewed: 06/15/2012 °[employer] Interactive Patient Education ©2016 [employer] °Braxton Hicks Contractions °Contractions of the uterus can occur throughout pregnancy. Contractions are not always a sign that you are in labor.  °WHAT ARE BRAXTON HICKS CONTRACTIONS?  °Contractions that occur before labor are called Braxton Hicks contractions, or false labor. Toward the end of pregnancy (32-34 weeks), these contractions can develop more often and may become more  forceful. This is not true labor because these contractions do not result in opening (dilatation) and thinning of the cervix. They are sometimes difficult to tell apart from true labor because these contractions can be forceful and people have different pain tolerances. You should   not feel embarrassed if you go to the hospital with false labor. Sometimes, the only way to tell if you are in true labor is for your health care provider to look for changes in the cervix. °If there are no prenatal problems or other health problems associated with the pregnancy, it is completely safe to be sent home with false labor and await the onset of true labor. °HOW CAN YOU TELL THE DIFFERENCE BETWEEN TRUE AND FALSE LABOR? °False Labor °· The contractions of false labor are usually shorter and not as hard as those of true labor.   °· The contractions are usually irregular.   °· The contractions are often felt in the front of the lower abdomen and in the groin.   °· The contractions may go away when you walk around or change positions while lying down.   °· The contractions get weaker and are shorter lasting as time goes on.   °· The contractions do not usually become progressively stronger, regular, and closer together as with true labor.   °True Labor °· Contractions in true labor last 30-70 seconds, become very regular, usually become more intense, and increase in frequency.   °· The contractions do not go away with walking.   °· The discomfort is usually felt in the top of the uterus and spreads to the lower abdomen and low back.   °· True labor can be determined by your health care provider with an exam. This will show that the cervix is dilating and getting thinner.   °WHAT TO REMEMBER °· Keep up with your usual exercises and follow other instructions given by your health care provider.   °· Take medicines as directed by your health care provider.   °· Keep your regular prenatal appointments.   °· Eat and drink lightly if you  think you are going into labor.   °· If Braxton Hicks contractions are making you uncomfortable:   °· Change your position from lying down or resting to walking, or from walking to resting.   °· Sit and rest in a tub of warm water.   °· Drink 2-3 glasses of water. Dehydration may cause these contractions.   °· Do slow and deep breathing several times an hour.   °WHEN SHOULD I SEEK IMMEDIATE MEDICAL CARE? °Seek immediate medical care if: °· Your contractions become stronger, more regular, and closer together.   °· You have fluid leaking or gushing from your vagina.   °· You have a fever.   °· You pass blood-tinged mucus.   °· You have vaginal bleeding.   °· You have continuous abdominal pain.   °· You have low back pain that you never had before.   °· You feel your baby's head pushing down and causing pelvic pressure.   °· Your baby is not moving as much as it used to.   °  °This information is not intended to replace advice given to you by your health care provider. Make sure you discuss any questions you have with your health care provider. °  °Document Released: 08/19/2005 Document Revised: 08/24/2013 Document Reviewed: 05/31/2013 °[employer] Interactive Patient Education ©2016 [employer] ° °

## 2015-10-31 ENCOUNTER — Encounter (HOSPITAL_COMMUNITY): Payer: Self-pay | Admitting: *Deleted

## 2015-10-31 ENCOUNTER — Inpatient Hospital Stay (HOSPITAL_COMMUNITY)
Admission: AD | Admit: 2015-10-31 | Discharge: 2015-10-31 | Disposition: A | Discharge status: Home / Self Care | Payer: BLUE CROSS/BLUE SHIELD | Source: Ambulatory Visit | Attending: Obstetrics and Gynecology | Admitting: Obstetrics and Gynecology

## 2015-10-31 DIAGNOSIS — Z3493 Encounter for supervision of normal pregnancy, unspecified, third trimester: Secondary | ICD-10-CM | POA: Diagnosis not present

## 2015-10-31 MED ORDER — PROMETHAZINE HCL 25 MG/ML IJ SOLN
12.5000 mg | Freq: Once | INTRAMUSCULAR | Status: AC
  Administered 2015-10-31: 12.5 mg via INTRAMUSCULAR
  Filled 2015-10-31: qty 1

## 2015-10-31 MED ORDER — BUTORPHANOL TARTRATE 1 MG/ML IJ SOLN
1.0000 mg | Freq: Once | INTRAMUSCULAR | Status: AC
  Administered 2015-10-31: 1 mg via INTRAMUSCULAR
  Filled 2015-10-31: qty 1

## 2015-10-31 NOTE — MAU Note (Signed)
Pt states she's been having contractions for 2 days.  Sent from the office today.  Contractions every 15 minutes.  Pain in constant in lower back which has progressed for a few weeks.  Denies vaginal bleeding.  Good fetal movement.

## 2015-10-31 NOTE — Discharge Instructions (Signed)
Fetal Movement Counts  Patient Name: __________________________________________________ Patient Due Date: ____________________  Performing a fetal movement count is highly recommended in high-risk pregnancies, but it is good for every pregnant woman to do. Your health care provider may ask you to start counting fetal movements at 28 weeks of the pregnancy. Fetal movements often increase:  · After eating a full meal.  · After physical activity.  · After eating or drinking something sweet or cold.  · At rest.  Pay attention to when you feel the baby is most active. This will help you notice a pattern of your baby's sleep and wake cycles and what factors contribute to an increase in fetal movement. It is important to perform a fetal movement count at the same time each day when your baby is normally most active.   HOW TO COUNT FETAL MOVEMENTS  1. Find a quiet and comfortable area to sit or lie down on your left side. Lying on your left side provides the best blood and oxygen circulation to your baby.  2. Write down the day and time on a sheet of paper or in a journal.  3. Start counting kicks, flutters, swishes, rolls, or jabs in a 2-hour period. You should feel at least 10 movements within 2 hours.  4. If you do not feel 10 movements in 2 hours, wait 2-3 hours and count again. Look for a change in the pattern or not enough counts in 2 hours.  SEEK MEDICAL CARE IF:  · You feel less than 10 counts in 2 hours, tried twice.  · There is no movement in over an hour.  · The pattern is changing or taking longer each day to reach 10 counts in 2 hours.  · You feel the baby is not moving as he or she usually does.  Date: ____________ Movements: ____________ Start time: ____________ Finish time: ____________   Date: ____________ Movements: ____________ Start time: ____________ Finish time: ____________  Date: ____________ Movements: ____________ Start time: ____________ Finish time: ____________  Date: ____________ Movements:  ____________ Start time: ____________ Finish time: ____________  Date: ____________ Movements: ____________ Start time: ____________ Finish time: ____________  Date: ____________ Movements: ____________ Start time: ____________ Finish time: ____________  Date: ____________ Movements: ____________ Start time: ____________ Finish time: ____________  Date: ____________ Movements: ____________ Start time: ____________ Finish time: ____________   Date: ____________ Movements: ____________ Start time: ____________ Finish time: ____________  Date: ____________ Movements: ____________ Start time: ____________ Finish time: ____________  Date: ____________ Movements: ____________ Start time: ____________ Finish time: ____________  Date: ____________ Movements: ____________ Start time: ____________ Finish time: ____________  Date: ____________ Movements: ____________ Start time: ____________ Finish time: ____________  Date: ____________ Movements: ____________ Start time: ____________ Finish time: ____________  Date: ____________ Movements: ____________ Start time: ____________ Finish time: ____________   Date: ____________ Movements: ____________ Start time: ____________ Finish time: ____________  Date: ____________ Movements: ____________ Start time: ____________ Finish time: ____________  Date: ____________ Movements: ____________ Start time: ____________ Finish time: ____________  Date: ____________ Movements: ____________ Start time: ____________ Finish time: ____________  Date: ____________ Movements: ____________ Start time: ____________ Finish time: ____________  Date: ____________ Movements: ____________ Start time: ____________ Finish time: ____________  Date: ____________ Movements: ____________ Start time: ____________ Finish time: ____________   Date: ____________ Movements: ____________ Start time: ____________ Finish time: ____________  Date: ____________ Movements: ____________ Start time: ____________ Finish  time: ____________  Date: ____________ Movements: ____________ Start time: ____________ Finish time: ____________  Date: ____________ Movements: ____________ Start time:   ____________ Finish time: ____________  Date: ____________ Movements: ____________ Start time: ____________ Finish time: ____________  Date: ____________ Movements: ____________ Start time: ____________ Finish time: ____________  Date: ____________ Movements: ____________ Start time: ____________ Finish time: ____________   Date: ____________ Movements: ____________ Start time: ____________ Finish time: ____________  Date: ____________ Movements: ____________ Start time: ____________ Finish time: ____________  Date: ____________ Movements: ____________ Start time: ____________ Finish time: ____________  Date: ____________ Movements: ____________ Start time: ____________ Finish time: ____________  Date: ____________ Movements: ____________ Start time: ____________ Finish time: ____________  Date: ____________ Movements: ____________ Start time: ____________ Finish time: ____________  Date: ____________ Movements: ____________ Start time: ____________ Finish time: ____________   Date: ____________ Movements: ____________ Start time: ____________ Finish time: ____________  Date: ____________ Movements: ____________ Start time: ____________ Finish time: ____________  Date: ____________ Movements: ____________ Start time: ____________ Finish time: ____________  Date: ____________ Movements: ____________ Start time: ____________ Finish time: ____________  Date: ____________ Movements: ____________ Start time: ____________ Finish time: ____________  Date: ____________ Movements: ____________ Start time: ____________ Finish time: ____________  Date: ____________ Movements: ____________ Start time: ____________ Finish time: ____________   Date: ____________ Movements: ____________ Start time: ____________ Finish time: ____________  Date: ____________  Movements: ____________ Start time: ____________ Finish time: ____________  Date: ____________ Movements: ____________ Start time: ____________ Finish time: ____________  Date: ____________ Movements: ____________ Start time: ____________ Finish time: ____________  Date: ____________ Movements: ____________ Start time: ____________ Finish time: ____________  Date: ____________ Movements: ____________ Start time: ____________ Finish time: ____________  Date: ____________ Movements: ____________ Start time: ____________ Finish time: ____________   Date: ____________ Movements: ____________ Start time: ____________ Finish time: ____________  Date: ____________ Movements: ____________ Start time: ____________ Finish time: ____________  Date: ____________ Movements: ____________ Start time: ____________ Finish time: ____________  Date: ____________ Movements: ____________ Start time: ____________ Finish time: ____________  Date: ____________ Movements: ____________ Start time: ____________ Finish time: ____________  Date: ____________ Movements: ____________ Start time: ____________ Finish time: ____________     This information is not intended to replace advice given to you by your health care provider. Make sure you discuss any questions you have with your health care provider.     Document Released: 09/18/2006 Document Revised: 09/09/2014 Document Reviewed: 06/15/2012  Elsevier Interactive Patient Education ©2016 [employer]

## 2015-11-10 ENCOUNTER — Encounter (HOSPITAL_COMMUNITY): Payer: Self-pay | Admitting: *Deleted

## 2015-11-10 ENCOUNTER — Telehealth (HOSPITAL_COMMUNITY): Payer: Self-pay | Admitting: *Deleted

## 2015-11-10 LAB — OB RESULTS CONSOLE GBS: STREP GROUP B AG: NEGATIVE

## 2015-11-10 NOTE — Telephone Encounter (Signed)
Preadmission screen  

## 2015-11-14 ENCOUNTER — Encounter (HOSPITAL_COMMUNITY): Payer: Self-pay

## 2015-11-14 ENCOUNTER — Inpatient Hospital Stay (HOSPITAL_COMMUNITY)
Admission: RE | Admit: 2015-11-14 | Discharge: 2015-11-16 | DRG: 775 | Disposition: A | Discharge status: Home / Self Care | Payer: BLUE CROSS/BLUE SHIELD | Source: Ambulatory Visit | Attending: Obstetrics and Gynecology | Admitting: Obstetrics and Gynecology

## 2015-11-14 ENCOUNTER — Inpatient Hospital Stay (HOSPITAL_COMMUNITY): Payer: BLUE CROSS/BLUE SHIELD | Admitting: Anesthesiology

## 2015-11-14 DIAGNOSIS — J45909 Unspecified asthma, uncomplicated: Secondary | ICD-10-CM | POA: Diagnosis present

## 2015-11-14 DIAGNOSIS — Z87891 Personal history of nicotine dependence: Secondary | ICD-10-CM | POA: Diagnosis not present

## 2015-11-14 DIAGNOSIS — O9952 Diseases of the respiratory system complicating childbirth: Secondary | ICD-10-CM | POA: Diagnosis present

## 2015-11-14 DIAGNOSIS — O480 Post-term pregnancy: Secondary | ICD-10-CM | POA: Diagnosis present

## 2015-11-14 DIAGNOSIS — Z811 Family history of alcohol abuse and dependence: Secondary | ICD-10-CM | POA: Diagnosis not present

## 2015-11-14 DIAGNOSIS — Z3A40 40 weeks gestation of pregnancy: Secondary | ICD-10-CM | POA: Diagnosis not present

## 2015-11-14 DIAGNOSIS — Z818 Family history of other mental and behavioral disorders: Secondary | ICD-10-CM

## 2015-11-14 LAB — CBC
HCT: 29.8 % — ABNORMAL LOW (ref 36.0–46.0)
Hemoglobin: 9.1 g/dL — ABNORMAL LOW (ref 12.0–15.0)
MCH: 23.6 pg — AB (ref 26.0–34.0)
MCHC: 30.5 g/dL (ref 30.0–36.0)
MCV: 77.2 fL — ABNORMAL LOW (ref 78.0–100.0)
PLATELETS: 175 10*3/uL (ref 150–400)
RBC: 3.86 MIL/uL — ABNORMAL LOW (ref 3.87–5.11)
RDW: 19.3 % — AB (ref 11.5–15.5)
WBC: 9.4 10*3/uL (ref 4.0–10.5)

## 2015-11-14 LAB — TYPE AND SCREEN
ABO/RH(D): A POS
Antibody Screen: NEGATIVE

## 2015-11-14 LAB — RPR: RPR Ser Ql: NONREACTIVE

## 2015-11-14 MED ORDER — BUPIVACAINE HCL (PF) 0.25 % IJ SOLN
INTRAMUSCULAR | Status: DC | PRN
  Administered 2015-11-14: 8 mL via EPIDURAL

## 2015-11-14 MED ORDER — PRENATAL MULTIVITAMIN CH
1.0000 | ORAL_TABLET | Freq: Every day | ORAL | Status: DC
  Administered 2015-11-15: 1 via ORAL
  Filled 2015-11-14: qty 1

## 2015-11-14 MED ORDER — SENNOSIDES-DOCUSATE SODIUM 8.6-50 MG PO TABS
2.0000 | ORAL_TABLET | ORAL | Status: DC
  Administered 2015-11-15 – 2015-11-16 (×2): 2 via ORAL
  Filled 2015-11-14 (×2): qty 2

## 2015-11-14 MED ORDER — OXYCODONE-ACETAMINOPHEN 5-325 MG PO TABS: 1.0000 | ORAL_TABLET | ORAL | Status: DC | PRN

## 2015-11-14 MED ORDER — LIDOCAINE HCL (PF) 1 % IJ SOLN
30.0000 mL | INTRAMUSCULAR | Status: DC | PRN
  Filled 2015-11-14: qty 30

## 2015-11-14 MED ORDER — BENZOCAINE-MENTHOL 20-0.5 % EX AERO
1.0000 | INHALATION_SPRAY | CUTANEOUS | Status: DC | PRN
Start: 2015-11-14 — End: 2015-11-16
  Administered 2015-11-15: 1 via TOPICAL
  Filled 2015-11-14 (×2): qty 56

## 2015-11-14 MED ORDER — OXYTOCIN 10 UNIT/ML IJ SOLN
1.0000 m[IU]/min | INTRAMUSCULAR | Status: DC
  Administered 2015-11-14: 2 m[IU]/min via INTRAVENOUS
  Filled 2015-11-14: qty 10

## 2015-11-14 MED ORDER — LACTATED RINGERS IV SOLN
500.0000 mL | Freq: Once | INTRAVENOUS | Status: AC
  Administered 2015-11-14: 500 mL via INTRAVENOUS

## 2015-11-14 MED ORDER — MEDROXYPROGESTERONE ACETATE 150 MG/ML IM SUSP: 150.0000 mg | INTRAMUSCULAR | Status: DC | PRN

## 2015-11-14 MED ORDER — ONDANSETRON HCL 4 MG/2ML IJ SOLN: 4.0000 mg | Freq: Four times a day (QID) | INTRAMUSCULAR | Status: DC | PRN

## 2015-11-14 MED ORDER — EPHEDRINE 5 MG/ML INJ
10.0000 mg | INTRAVENOUS | Status: DC | PRN
  Filled 2015-11-14: qty 2

## 2015-11-14 MED ORDER — WITCH HAZEL-GLYCERIN EX PADS: 1.0000 "application " | MEDICATED_PAD | CUTANEOUS | Status: DC | PRN

## 2015-11-14 MED ORDER — ZOLPIDEM TARTRATE 5 MG PO TABS: 5.0000 mg | ORAL_TABLET | Freq: Every evening | ORAL | Status: DC | PRN

## 2015-11-14 MED ORDER — FENTANYL CITRATE (PF) 1000 MCG/20ML IJ SOLN
INTRAMUSCULAR | Status: DC
  Administered 2015-11-14: 14:00:00 via EPIDURAL
  Filled 2015-11-14: qty 10

## 2015-11-14 MED ORDER — OXYCODONE-ACETAMINOPHEN 5-325 MG PO TABS
2.0000 | ORAL_TABLET | ORAL | Status: DC | PRN
Start: 2015-11-14 — End: 2015-11-16
  Administered 2015-11-15 – 2015-11-16 (×5): 2 via ORAL
  Filled 2015-11-14 (×5): qty 2

## 2015-11-14 MED ORDER — OXYCODONE-ACETAMINOPHEN 5-325 MG PO TABS
2.0000 | ORAL_TABLET | ORAL | Status: DC | PRN
  Administered 2015-11-14: 2 via ORAL
  Filled 2015-11-14: qty 2

## 2015-11-14 MED ORDER — OXYTOCIN BOLUS FROM INFUSION
500.0000 mL | INTRAVENOUS | Status: DC
  Administered 2015-11-14: 500 mL via INTRAVENOUS

## 2015-11-14 MED ORDER — PHENYLEPHRINE 40 MCG/ML (10ML) SYRINGE FOR IV PUSH (FOR BLOOD PRESSURE SUPPORT)
80.0000 ug | PREFILLED_SYRINGE | INTRAVENOUS | Status: DC | PRN
  Filled 2015-11-14: qty 20
  Filled 2015-11-14: qty 2

## 2015-11-14 MED ORDER — TERBUTALINE SULFATE 1 MG/ML IJ SOLN
0.2500 mg | Freq: Once | INTRAMUSCULAR | Status: DC | PRN
  Filled 2015-11-14: qty 1

## 2015-11-14 MED ORDER — ONDANSETRON HCL 4 MG/2ML IJ SOLN
4.0000 mg | INTRAMUSCULAR | Status: DC | PRN
Start: 2015-11-14 — End: 2015-11-16

## 2015-11-14 MED ORDER — TETANUS-DIPHTH-ACELL PERTUSSIS 5-2.5-18.5 LF-MCG/0.5 IM SUSP: 0.5000 mL | Freq: Once | INTRAMUSCULAR | Status: DC

## 2015-11-14 MED ORDER — LACTATED RINGERS IV SOLN: 500.0000 mL | INTRAVENOUS | Status: DC | PRN

## 2015-11-14 MED ORDER — ONDANSETRON HCL 4 MG PO TABS: 4.0000 mg | ORAL_TABLET | ORAL | Status: DC | PRN

## 2015-11-14 MED ORDER — MISOPROSTOL 25 MCG QUARTER TABLET
25.0000 ug | ORAL_TABLET | ORAL | Status: DC | PRN
  Administered 2015-11-14: 25 ug via VAGINAL
  Filled 2015-11-14: qty 0.25
  Filled 2015-11-14: qty 1

## 2015-11-14 MED ORDER — IBUPROFEN 600 MG PO TABS
600.0000 mg | ORAL_TABLET | Freq: Four times a day (QID) | ORAL | Status: DC
  Administered 2015-11-14 – 2015-11-16 (×7): 600 mg via ORAL
  Filled 2015-11-14 (×7): qty 1

## 2015-11-14 MED ORDER — DIPHENHYDRAMINE HCL 25 MG PO CAPS: 25.0000 mg | ORAL_CAPSULE | Freq: Four times a day (QID) | ORAL | Status: DC | PRN

## 2015-11-14 MED ORDER — CITRIC ACID-SODIUM CITRATE 334-500 MG/5ML PO SOLN
30.0000 mL | ORAL | Status: DC | PRN
Start: 2015-11-14 — End: 2015-11-14

## 2015-11-14 MED ORDER — LACTATED RINGERS IV SOLN
INTRAVENOUS | Status: DC
  Administered 2015-11-14 (×2): via INTRAVENOUS

## 2015-11-14 MED ORDER — MEASLES, MUMPS & RUBELLA VAC ~~LOC~~ INJ
0.5000 mL | INJECTION | Freq: Once | SUBCUTANEOUS | Status: DC
  Filled 2015-11-14: qty 0.5

## 2015-11-14 MED ORDER — SIMETHICONE 80 MG PO CHEW: 80.0000 mg | CHEWABLE_TABLET | ORAL | Status: DC | PRN

## 2015-11-14 MED ORDER — OXYTOCIN 10 UNIT/ML IJ SOLN: 2.5000 [IU]/h | INTRAVENOUS | Status: DC

## 2015-11-14 MED ORDER — DIBUCAINE 1 % RE OINT
1.0000 | TOPICAL_OINTMENT | RECTAL | Status: DC | PRN
Start: 2015-11-14 — End: 2015-11-16

## 2015-11-14 MED ORDER — FENTANYL 2.5 MCG/ML BUPIVACAINE 1/10 % EPIDURAL INFUSION (WH - ANES)
14.0000 mL/h | INTRAMUSCULAR | Status: DC | PRN
  Administered 2015-11-14: 14 mL/h via EPIDURAL
  Filled 2015-11-14: qty 125

## 2015-11-14 MED ORDER — BUTORPHANOL TARTRATE 1 MG/ML IJ SOLN
1.0000 mg | INTRAMUSCULAR | Status: DC | PRN
Start: 2015-11-14 — End: 2015-11-14

## 2015-11-14 MED ORDER — LANOLIN HYDROUS EX OINT: TOPICAL_OINTMENT | CUTANEOUS | Status: DC | PRN

## 2015-11-14 MED ORDER — ACETAMINOPHEN 325 MG PO TABS: 650.0000 mg | ORAL_TABLET | ORAL | Status: DC | PRN

## 2015-11-14 MED ORDER — PHENYLEPHRINE 40 MCG/ML (10ML) SYRINGE FOR IV PUSH (FOR BLOOD PRESSURE SUPPORT)
80.0000 ug | PREFILLED_SYRINGE | INTRAVENOUS | Status: DC | PRN
  Filled 2015-11-14: qty 2

## 2015-11-14 MED ORDER — ALBUTEROL SULFATE (2.5 MG/3ML) 0.083% IN NEBU: 5.0000 mg | INHALATION_SOLUTION | Freq: Four times a day (QID) | RESPIRATORY_TRACT | Status: DC | PRN

## 2015-11-14 MED ORDER — ALBUTEROL SULFATE HFA 108 (90 BASE) MCG/ACT IN AERS: 2.0000 | INHALATION_SPRAY | Freq: Four times a day (QID) | RESPIRATORY_TRACT | Status: DC | PRN

## 2015-11-14 MED ORDER — LIDOCAINE HCL (PF) 1 % IJ SOLN
INTRAMUSCULAR | Status: DC | PRN
  Administered 2015-11-14 (×2): 5 mL

## 2015-11-14 MED ORDER — DIPHENHYDRAMINE HCL 50 MG/ML IJ SOLN
12.5000 mg | INTRAMUSCULAR | Status: DC | PRN
Start: 2015-11-14 — End: 2015-11-14

## 2015-11-14 MED ORDER — PROMETHAZINE HCL 25 MG/ML IJ SOLN: 12.5000 mg | Freq: Four times a day (QID) | INTRAMUSCULAR | Status: DC | PRN

## 2015-11-14 NOTE — Progress Notes (Signed)
Cytotec removed at 0405 due to indeterminate FHR and a possibility of irregular FHR decelerations. 500 ml bolus given and oxygen applied. MD informed and reviewed patient strip. Patient and FHR is currently stable.
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
## 2015-11-14 NOTE — Anesthesia Procedure Notes (Signed)
Epidural Patient location during procedure: OB  Staffing Anesthesiologist: Japheth Diekman Performed by: anesthesiologist   Preanesthetic Checklist Completed: patient identified, site marked, surgical consent, pre-op evaluation, timeout performed, IV checked, risks and benefits discussed and monitors and equipment checked  Epidural Patient position: sitting Prep: DuraPrep Patient monitoring: heart rate, continuous pulse ox and blood pressure Approach: midline Location: L3-L4 Injection technique: LOR saline  Needle:  Needle type: Tuohy  Needle gauge: 17 G Needle length: 9 cm and 9 Needle insertion depth: 5 cm Catheter type: closed end flexible Catheter size: 20 Guage Catheter at skin depth: 9 cm Test dose: negative  Assessment Events: blood not aspirated, injection not painful, no injection resistance, negative IV test and no paresthesia  Additional Notes Patient identified. Risks/Benefits/Options discussed with patient including but not limited to bleeding, infection, nerve damage, paralysis, failed block, incomplete pain control, headache, blood pressure changes, nausea, vomiting, reactions to medication both or allergic, itching and postpartum back pain. Confirmed with bedside nurse the patient's most recent platelet count. Confirmed with patient that they are not currently taking any anticoagulation, have any bleeding history or any family history of bleeding disorders. Patient expressed understanding and wished to proceed. All questions were answered. Sterile technique was used throughout the entire procedure. Please see nursing notes for vital signs. Test dose was given through epidural needle and negative prior to continuing to dose epidural or start infusion. Warning signs of high block given to the patient including shortness of breath, tingling/numbness in hands, complete motor block, or any concerning symptoms with instructions to call for help. Patient was given  instructions on fall risk and not to get out of bed. All questions and concerns addressed with instructions to call with any issues.   

## 2015-11-14 NOTE — Anesthesia Preprocedure Evaluation (Signed)
Anesthesia Evaluation  Patient identified by MRN, date of birth, ID band Patient awake    Reviewed: Allergy & Precautions, H&P , NPO status , Patient's Chart, lab work & pertinent test results  History of Anesthesia Complications Negative for: history of anesthetic complications  Airway Mallampati: II  TM Distance: >3 FB Neck ROM: full    Dental no notable dental hx. (+) Teeth Intact   Pulmonary neg pulmonary ROS, former smoker,    Pulmonary exam normal breath sounds clear to auscultation       Cardiovascular negative cardio ROS Normal cardiovascular exam Rhythm:regular Rate:Normal     Neuro/Psych negative neurological ROS  negative psych ROS   GI/Hepatic negative GI ROS, Neg liver ROS,   Endo/Other  negative endocrine ROS  Renal/GU negative Renal ROS  negative genitourinary   Musculoskeletal   Abdominal   Peds  Hematology negative hematology ROS (+)   Anesthesia Other Findings   Reproductive/Obstetrics (+) Pregnancy                             Anesthesia Physical Anesthesia Plan  ASA: II  Anesthesia Plan: Epidural   Post-op Pain Management:    Induction:   Airway Management Planned:   Additional Equipment:   Intra-op Plan:   Post-operative Plan:   Informed Consent: I have reviewed the patients History and Physical, chart, labs and discussed the procedure including the risks, benefits and alternatives for the proposed anesthesia with the patient or authorized representative who has indicated his/her understanding and acceptance.     Plan Discussed with:   Anesthesia Plan Comments:         Anesthesia Quick Evaluation  

## 2015-11-14 NOTE — Consults (Signed)
  Anesthesia Pain Consult Note  Patient: Gabrielle Nguyen, 20 y.o., female  Consult Requested by: Candice Camp, MD  Reason for Consult: CRNA Pain Management Rounds  Level of Consciousness: alert  Pain: 0 /10 Pain goal: 3.5  Last Vitals:  Filed Vitals:   11/14/15 0505 11/14/15 0510  BP:    Pulse: 78 76  Temp:    Resp:      Plan: Epidural infusion for pain control.  Landrum Carbonell 11/14/2015

## 2015-11-15 LAB — CBC
HCT: 25 % — ABNORMAL LOW (ref 36.0–46.0)
HEMATOCRIT: 27.1 % — AB (ref 36.0–46.0)
HEMOGLOBIN: 7.4 g/dL — AB (ref 12.0–15.0)
HEMOGLOBIN: 8.1 g/dL — AB (ref 12.0–15.0)
MCH: 23.3 pg — AB (ref 26.0–34.0)
MCH: 23.9 pg — ABNORMAL LOW (ref 26.0–34.0)
MCHC: 29.6 g/dL — ABNORMAL LOW (ref 30.0–36.0)
MCHC: 29.9 g/dL — ABNORMAL LOW (ref 30.0–36.0)
MCV: 78.9 fL (ref 78.0–100.0)
MCV: 79.9 fL (ref 78.0–100.0)
Platelets: 147 10*3/uL — ABNORMAL LOW (ref 150–400)
Platelets: 166 10*3/uL (ref 150–400)
RBC: 3.17 MIL/uL — AB (ref 3.87–5.11)
RBC: 3.39 MIL/uL — AB (ref 3.87–5.11)
RDW: 19.5 % — ABNORMAL HIGH (ref 11.5–15.5)
RDW: 19.7 % — ABNORMAL HIGH (ref 11.5–15.5)
WBC: 8.6 10*3/uL (ref 4.0–10.5)
WBC: 7.6 10*3/uL (ref 4.0–10.5)

## 2015-11-15 MED ORDER — FERROUS SULFATE 325 (65 FE) MG PO TABS
325.0000 mg | ORAL_TABLET | Freq: Three times a day (TID) | ORAL | Status: DC
  Administered 2015-11-16: 325 mg via ORAL
  Filled 2015-11-15 (×3): qty 1

## 2015-11-15 NOTE — Progress Notes (Signed)
[name] was referred for history of depression/anxiety.  Referral is screened out by Clinical Social Worker because none of the following criteria appear to apply: -History of anxiety/depression during this pregnancy, or of post-partum depression. - Diagnosis of anxiety and/or depression within last 3 years (since 2012, with no concerns during the pregnancy) or -[name]'s symptoms are currently being treated with medication and/or therapy.  Please contact the Clinical Social Worker if needs arise or upon [name] request.

## 2015-11-15 NOTE — Lactation Note (Signed)
This note was copied from a baby's chart. Lactation Consultation Note  Patient Name: Gabrielle Nguyen Today's Date: 11/15/2015 Reason for consult: Initial assessment Baby at 18 hr of life and mom reports bf is going well. Denies breast or nipple pain. Demonstrated manual expression, colostrum noted bilaterally, spoon in room. Discussed baby behavior, feeding frequency, baby belly size, voids, wt loss, breast changes, and nipple care. Given lactation handouts. Aware of OP services and support group.     Maternal Data Has patient been taught Hand Expression?: Yes Does the patient have breastfeeding experience prior to this delivery?: No  Feeding Feeding Type: Breast Fed Length of feed: 20 min  LATCH Score/Interventions                      Lactation Tools Discussed/Used WIC Program: Yes   Consult Status Consult Status: Follow-up Date: 11/16/15 Follow-up type: In-patient    [name] 11/15/2015, 11:03 AM

## 2015-11-15 NOTE — Anesthesia Postprocedure Evaluation (Signed)
Anesthesia Post Note  Patient: Gabrielle Nguyen  Procedure(s) Performed: * No procedures listed *  Patient location during evaluation: Mother Baby Anesthesia Type: Epidural Level of consciousness: awake and alert Pain management: pain level controlled Vital Signs Assessment: post-procedure vital signs reviewed and stable Respiratory status: spontaneous breathing, nonlabored ventilation and respiratory function stable Cardiovascular status: stable Postop Assessment: no headache, no backache and epidural receding Anesthetic complications: no    Last Vitals:  Filed Vitals:   11/15/15 0000 11/15/15 0632  BP: 106/58 119/69  Pulse: 68 89  Temp: 36.5 C 36.9 C  Resp: 18 16    Last Pain:  Filed Vitals:   11/15/15 0711  PainSc: 8                  Khari Mally

## 2015-11-15 NOTE — Progress Notes (Signed)
Post Partum Day 1 Subjective: up ad lib, voiding, tolerating PO, + flatus and complains of abd cramping  Objective: Blood pressure 119/69, pulse 89, temperature 98.5 F (36.9 C), temperature source Oral, resp. rate 16, height 5\' 7"  (1.702 m), weight 159 lb (72.122 kg), last menstrual period 09/28/2014, SpO2 99 %, unknown if currently breastfeeding.  Physical Exam:  General: alert, cooperative and moderate distress Lochia: appropriate Uterine Fundus: firm  U-2 Incision: healing well, no evidence of hematoma, perineum and labia soft no ecchymosis DVT Evaluation: No evidence of DVT seen on physical exam. Negative Homan's sign. No cords or calf tenderness. No significant calf/ankle edema.   Recent Labs  11/14/15 0200 11/15/15 0517  HGB 9.1* 7.4*  HCT 29.8* 25.0*    Assessment/Plan: Plan for discharge tomorrow  Cbc @ 1 pm   LOS: 1 day   Latalia Etzler G 11/15/2015, 8:30 AM

## 2015-11-16 ENCOUNTER — Ambulatory Visit: Payer: Self-pay

## 2015-11-16 MED ORDER — OXYCODONE-ACETAMINOPHEN 5-325 MG PO TABS: 1.0000 | ORAL_TABLET | ORAL | Status: DC | PRN

## 2015-11-16 MED ORDER — IBUPROFEN 600 MG PO TABS: 600.0000 mg | ORAL_TABLET | Freq: Four times a day (QID) | ORAL | Status: DC

## 2015-11-16 NOTE — Discharge Summary (Signed)
Obstetric Discharge Summary Reason for Admission: induction of labor Prenatal Procedures: ultrasound Intrapartum Procedures: spontaneous vaginal delivery Postpartum Procedures: none Complications-Operative and Postpartum: 2 degree perineal laceration HEMOGLOBIN  Date Value Ref Range Status  11/15/2015 8.1* 12.0 - 15.0 g/dL Final   HCT  Date Value Ref Range Status  11/15/2015 27.1* 36.0 - 46.0 % Final    Physical Exam:  General: alert and cooperative Lochia: appropriate Uterine Fundus: firm Incision: healing well DVT Evaluation: No evidence of DVT seen on physical exam. Negative Homan's sign. No cords or calf tenderness. No significant calf/ankle edema.  Discharge Diagnoses: Term Pregnancy-delivered  Discharge Information: Date: 11/16/2015 Activity: pelvic rest Diet: routine Medications: PNV, Ibuprofen, Iron and Percocet Condition: stable Instructions: refer to practice specific booklet Discharge to: home   Newborn Data: Live born female  Birth Weight: 6 lb 10.2 oz (3011 g) APGAR: 9, 9  Home with mother.  CURTIS,CAROL G 11/16/2015, 8:21 AM

## 2015-11-16 NOTE — Lactation Note (Signed)
This note was copied from a baby's chart. Lactation Consultation Note; Mother request more comfort gels. She states that she is very sore. Observed that mother has bilateral positional strips.  Mother plans to take a break from breastfeeding for several feedings. She was given a hand pump but is afraid to try due to pain at this point.  Infant was fed by LC using a gloved finger and curved tip syringe 15 ml of formula. Parents were taught to finger feed.  Mother states she has Dr Manson Passey bottles at home that she plans to use.  Advised mother to post pump every 2-3 hours for 15 mins on each breast. Mother advised to continue to breastfeed infant on cue when her nipples heal and 8-12 times in 24 hours . Mother advised in treatment to prevent severe engorgement. Mother to follow up with [facility] in out patient dept.  Patient Name: Gabrielle Nguyen [id number] Date: 11/16/2015 Reason for consult: Follow-up assessment   Maternal Data    Feeding Feeding Type: Formula  LATCH Score/Interventions                      Lactation Tools Discussed/Used     Consult Status Consult Status: Complete    Michel Bickers 11/16/2015, 11:43 AM

## 2018-01-07 DIAGNOSIS — F4310 Post-traumatic stress disorder, unspecified: Secondary | ICD-10-CM | POA: Insufficient documentation

## 2019-09-03 NOTE — L&D Delivery Note (Signed)
Patient delivered via NSVD Viable female apgars 9 and 9  Placenta delivered spontaneously and 3 vessel cord  Mild shoulder dystocia corrected with McRoberts maneuver No lacerations EBL 300 cc Transfer to [facility] secondary to no prenatal care

## 2019-11-15 ENCOUNTER — Telehealth: Payer: Self-pay | Admitting: Family Medicine

## 2019-11-15 NOTE — Telephone Encounter (Signed)
Attempted to reach patient to get information needed to get her scheduled in our office for prenatal care. Left a voicemail message for her to call the office.

## 2019-11-26 ENCOUNTER — Encounter (HOSPITAL_COMMUNITY): Payer: Self-pay | Admitting: *Deleted

## 2019-11-26 ENCOUNTER — Inpatient Hospital Stay (HOSPITAL_COMMUNITY)
Admission: AD | Admit: 2019-11-26 | Discharge: 2019-11-28 | DRG: 807 | Disposition: A | Discharge status: Home / Self Care | Payer: Medicaid Other | Attending: Family Medicine | Admitting: Family Medicine

## 2019-11-26 DIAGNOSIS — O99344 Other mental disorders complicating childbirth: Secondary | ICD-10-CM | POA: Diagnosis present

## 2019-11-26 DIAGNOSIS — Z20822 Contact with and (suspected) exposure to covid-19: Secondary | ICD-10-CM | POA: Diagnosis present

## 2019-11-26 DIAGNOSIS — F410 Panic disorder [episodic paroxysmal anxiety] without agoraphobia: Secondary | ICD-10-CM | POA: Diagnosis present

## 2019-11-26 DIAGNOSIS — O0930 Supervision of pregnancy with insufficient antenatal care, unspecified trimester: Secondary | ICD-10-CM

## 2019-11-26 DIAGNOSIS — Z87891 Personal history of nicotine dependence: Secondary | ICD-10-CM

## 2019-11-26 DIAGNOSIS — R825 Elevated urine levels of drugs, medicaments and biological substances: Secondary | ICD-10-CM

## 2019-11-26 DIAGNOSIS — F329 Major depressive disorder, single episode, unspecified: Secondary | ICD-10-CM | POA: Diagnosis present

## 2019-11-26 DIAGNOSIS — Z3A42 42 weeks gestation of pregnancy: Secondary | ICD-10-CM

## 2019-11-26 DIAGNOSIS — O26893 Other specified pregnancy related conditions, third trimester: Secondary | ICD-10-CM | POA: Diagnosis present

## 2019-11-26 LAB — COMPREHENSIVE METABOLIC PANEL
ALT: 9 U/L (ref 0–44)
AST: 21 U/L (ref 15–41)
Albumin: 2.7 g/dL — ABNORMAL LOW (ref 3.5–5.0)
Alkaline Phosphatase: 123 U/L (ref 38–126)
Anion gap: 10 (ref 5–15)
BUN: 6 mg/dL (ref 6–20)
CO2: 20 mmol/L — ABNORMAL LOW (ref 22–32)
Calcium: 8.7 mg/dL — ABNORMAL LOW (ref 8.9–10.3)
Chloride: 104 mmol/L (ref 98–111)
Creatinine, Ser: 0.62 mg/dL (ref 0.44–1.00)
GFR calc Af Amer: >60 mL/min (ref >60)
GFR calc non Af Amer: >60 mL/min (ref >60)
Glucose, Bld: 109 mg/dL — ABNORMAL HIGH (ref 70–99)
Potassium: 3.4 mmol/L — ABNORMAL LOW (ref 3.5–5.1)
Sodium: 134 mmol/L — ABNORMAL LOW (ref 135–145)
Total Bilirubin: 0.3 mg/dL (ref 0.3–1.2)
Total Protein: 5.8 g/dL — ABNORMAL LOW (ref 6.5–8.1)

## 2019-11-26 LAB — CBC
HCT: 28.4 % — ABNORMAL LOW (ref 36.0–46.0)
Hemoglobin: 8.4 g/dL — ABNORMAL LOW (ref 12.0–15.0)
MCH: 23.1 pg — ABNORMAL LOW (ref 26.0–34.0)
MCHC: 29.6 g/dL — ABNORMAL LOW (ref 30.0–36.0)
MCV: 78.2 fL — ABNORMAL LOW (ref 80.0–100.0)
Platelets: 115 10*3/uL — ABNORMAL LOW (ref 150–400)
RBC: 3.63 MIL/uL — ABNORMAL LOW (ref 3.87–5.11)
RDW: 16 % — ABNORMAL HIGH (ref 11.5–15.5)
WBC: 9 10*3/uL (ref 4.0–10.5)
nRBC: 0.3 % — ABNORMAL HIGH (ref 0.0–0.2)

## 2019-11-26 LAB — RAPID URINE DRUG SCREEN, HOSP PERFORMED
Amphetamines: NOT DETECTED
Barbiturates: NOT DETECTED
Benzodiazepines: POSITIVE — AB
Cocaine: NOT DETECTED
Opiates: POSITIVE — AB
Tetrahydrocannabinol: NOT DETECTED

## 2019-11-26 LAB — HEMOGLOBIN A1C
Hgb A1c MFr Bld: 5.8 % — ABNORMAL HIGH (ref 4.8–5.6)
Mean Plasma Glucose: 119.76 mg/dL

## 2019-11-26 LAB — HIV ANTIBODY (ROUTINE TESTING W REFLEX): HIV Screen 4th Generation wRfx: NONREACTIVE

## 2019-11-26 LAB — URINALYSIS, MICROSCOPIC (REFLEX): RBC / HPF: >50 RBC/hpf (ref 0–5)

## 2019-11-26 LAB — URINALYSIS, ROUTINE W REFLEX MICROSCOPIC

## 2019-11-26 LAB — HEPATITIS B SURFACE ANTIGEN: Hepatitis B Surface Ag: NONREACTIVE

## 2019-11-26 LAB — ABO/RH: ABO/RH(D): A POS

## 2019-11-26 LAB — TYPE AND SCREEN
ABO/RH(D): A POS
Antibody Screen: NEGATIVE

## 2019-11-26 LAB — SARS CORONAVIRUS 2 (TAT 6-24 HRS): SARS Coronavirus 2: NEGATIVE

## 2019-11-26 LAB — HEPATITIS C ANTIBODY: HCV Ab: NONREACTIVE

## 2019-11-26 MED ORDER — BENZOCAINE-MENTHOL 20-0.5 % EX AERO
1.0000 "application " | INHALATION_SPRAY | CUTANEOUS | Status: DC | PRN
  Administered 2019-11-28: 1 via TOPICAL
  Filled 2019-11-26: qty 56

## 2019-11-26 MED ORDER — IBUPROFEN 600 MG PO TABS
600.0000 mg | ORAL_TABLET | Freq: Four times a day (QID) | ORAL | Status: DC
  Administered 2019-11-26 – 2019-11-28 (×9): 600 mg via ORAL
  Filled 2019-11-26 (×9): qty 1

## 2019-11-26 MED ORDER — DIPHENHYDRAMINE HCL 25 MG PO CAPS: 25.0000 mg | ORAL_CAPSULE | Freq: Four times a day (QID) | ORAL | Status: DC | PRN

## 2019-11-26 MED ORDER — OXYTOCIN 10 UNIT/ML IJ SOLN
10.0000 [IU] | Freq: Once | INTRAMUSCULAR | Status: AC
  Administered 2019-11-26: 10 [IU] via INTRAMUSCULAR

## 2019-11-26 MED ORDER — SENNOSIDES-DOCUSATE SODIUM 8.6-50 MG PO TABS
2.0000 | ORAL_TABLET | ORAL | Status: DC
  Administered 2019-11-27 (×2): 2 via ORAL
  Filled 2019-11-26 (×2): qty 2

## 2019-11-26 MED ORDER — DIBUCAINE (PERIANAL) 1 % EX OINT: 1.0000 "application " | TOPICAL_OINTMENT | CUTANEOUS | Status: DC | PRN

## 2019-11-26 MED ORDER — TETANUS-DIPHTH-ACELL PERTUSSIS 5-2.5-18.5 LF-MCG/0.5 IM SUSP: 0.5000 mL | Freq: Once | INTRAMUSCULAR | Status: DC

## 2019-11-26 MED ORDER — ONDANSETRON HCL 4 MG PO TABS: 4.0000 mg | ORAL_TABLET | ORAL | Status: DC | PRN

## 2019-11-26 MED ORDER — WITCH HAZEL-GLYCERIN EX PADS: 1.0000 "application " | MEDICATED_PAD | CUTANEOUS | Status: DC | PRN

## 2019-11-26 MED ORDER — OXYCODONE HCL 5 MG PO TABS: 5.0000 mg | ORAL_TABLET | ORAL | Status: DC | PRN

## 2019-11-26 MED ORDER — OXYTOCIN 10 UNIT/ML IJ SOLN
INTRAMUSCULAR | Status: AC
  Filled 2019-11-26: qty 1

## 2019-11-26 MED ORDER — SIMETHICONE 80 MG PO CHEW: 80.0000 mg | CHEWABLE_TABLET | ORAL | Status: DC | PRN

## 2019-11-26 MED ORDER — PRENATAL MULTIVITAMIN CH
1.0000 | ORAL_TABLET | Freq: Every day | ORAL | Status: DC
  Administered 2019-11-26 – 2019-11-28 (×3): 1 via ORAL
  Filled 2019-11-26 (×3): qty 1

## 2019-11-26 MED ORDER — ACETAMINOPHEN 325 MG PO TABS
650.0000 mg | ORAL_TABLET | ORAL | Status: DC | PRN
  Administered 2019-11-26 – 2019-11-28 (×5): 650 mg via ORAL
  Filled 2019-11-26 (×5): qty 2

## 2019-11-26 MED ORDER — ERYTHROMYCIN 5 MG/GM OP OINT
TOPICAL_OINTMENT | OPHTHALMIC | Status: AC
  Filled 2019-11-26: qty 1

## 2019-11-26 MED ORDER — ONDANSETRON HCL 4 MG/2ML IJ SOLN: 4.0000 mg | INTRAMUSCULAR | Status: DC | PRN

## 2019-11-26 MED ORDER — ZOLPIDEM TARTRATE 5 MG PO TABS: 5.0000 mg | ORAL_TABLET | Freq: Every evening | ORAL | Status: DC | PRN

## 2019-11-26 MED ORDER — FERROUS SULFATE 325 (65 FE) MG PO TABS
325.0000 mg | ORAL_TABLET | ORAL | Status: DC
  Filled 2019-11-26 (×2): qty 1

## 2019-11-26 MED ORDER — OXYCODONE HCL 5 MG PO TABS
10.0000 mg | ORAL_TABLET | ORAL | Status: DC | PRN
  Administered 2019-11-26: 10 mg via ORAL
  Filled 2019-11-26: qty 2

## 2019-11-26 MED ORDER — COCONUT OIL OIL: 1.0000 "application " | TOPICAL_OIL | Status: DC | PRN
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
## 2019-11-26 NOTE — Progress Notes (Addendum)
CSW acknowledges consult for no PNC as well as other social needs. CSW spoke with Pediatrician and [facility] CPS. CSW was advised by [facility] CPS Intake that [name] currently has no open case with the agency and that the  last case is  from 2019 which was screened out. CSW was made aware by CPS intake that [name]'s oldest child stays with PGP's, but [name] has the ability to go and pick child up whenever desired (Per CPS Intake, it is not documented in their system that [name] lost custody of child). CSW understanding of this and will leave handoff for weekend CSW to follow up with [name] to gather more details on need.     Claude Manges Marcus Schwandt, MSW, LCSW [facility] [place] [phone]

## 2019-11-26 NOTE — MAU Note (Signed)
Pt presents EMS with contractions. Pt complete on arrival
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
## 2019-11-26 NOTE — Discharge Instructions (Signed)

## 2019-11-26 NOTE — Progress Notes (Signed)
RN was in room assisting infant to feed. When RN entered room initially, RN observed [name] with eyes closed and almost alseep, holding a bottle in infants mouth, laying on bed next to her, with blanket covering infants face from side over. RN woke [name] easily and offered assistance. Infant easily fed 12 mL of formula. [name] drowsy during this time. When RN was placing infant in crib to leave, [name] asked for infant back. RN educated [name] on safe sleep again (was initially done at admission), and [name] assured RN she would not sleep with infant in bed and FOB would be back in room soon. RN also saw what appeared to be a vape pen next to [name] on bed when she was handing infant to her. At this time, [name] checking infant's face for blanket coverage and position. FOB entered room when RN was exiting.

## 2019-11-26 NOTE — Progress Notes (Signed)
Care assumed from Dr. Vincente Poli. No PNC, precipitous delivery in MAU. She wishes to breastfeed and wants POPs for birth control.  Dr. Shawnie Pons made aware. Will obtain OB panel GBS unknown, no prophylaxis given SW consult for no prenatal care Routine postpartum orders and admission to [facility] service  Marlowe Alt, DO OB Fellow, Faculty Practice 11/26/2019 10:58 AM

## 2019-11-26 NOTE — Discharge Summary (Signed)
Postpartum Discharge Summary     Patient Name: Gabrielle Nguyen DOB: [birth year] MRN: [id number]  Date of admission: 11/26/2019 Delivering Provider: Dian Queen   Date of discharge: 11/28/2019  Admitting diagnosis: Normal labor and delivery [O80] Intrauterine pregnancy: [redacted]w[redacted]d    Secondary diagnosis:  Principal Problem:   Normal labor and delivery Active Problems:   MDD (major depressive disorder)   Panic disorder   Positive urine drug screen   No prenatal care in current pregnancy  Additional problems: None     Discharge diagnosis: Term Pregnancy Delivered                                                                                                Post partum procedures:None  Augmentation: None  Complications: None  Hospital course:  Onset of Labor With Vaginal Delivery     24y.o. yo [id number] admitted in Active Labor on 11/26/2019. Patient had an uncomplicated labor course as follows:   She arrived to MAU complete and +2 station, she delivered precipitously shortly after.  Membrane Rupture Time/Date: 9:30 AM ,11/26/2019   Intrapartum Procedures: Episiotomy: None [1]                                         Lacerations:  None [1]  Patient had a delivery of a Viable infant. 11/26/2019  Information for the patient's newborn:  MaJosselin, [place]0[[id number]]Delivery Method: Vag-Spont     Patient had an uncomplicated postpartum course. GBS status was unknown at time of delivery. SW consulted and CPS will follow outpatient as needed. Micronor prescribed on discharge. She is ambulating, tolerating a regular diet, passing flatus, and urinating well. Patient is discharged home in stable condition on 11/28/19.  Delivery time: 10:04 AM   Magnesium Sulfate received: No BMZ received: No Rhophylac:N/A MMR:No Transfusion:No  Physical exam  Vitals:   11/27/19 0900 11/27/19 1407 11/27/19 2222 11/28/19 0537  BP: 98/62 97/60 130/72 98/60  Pulse: 61 72 100 83   Resp: '16 15 16 16  ' Temp: 98.4 F (36.9 C) 98.2 F (36.8 C) 98.1 F (36.7 C) 98.4 F (36.9 C)  TempSrc: Oral Oral Oral Oral  SpO2:  99% 99%   Weight:      Height:       General: alert, cooperative and no distress Lochia: appropriate Uterine Fundus: firm Incision: N/A DVT Evaluation: No evidence of DVT seen on physical exam. Labs: Lab Results  Component Value Date   WBC 9.4 11/27/2019   HGB 7.8 (L) 11/27/2019   HCT 25.6 (L) 11/27/2019   MCV 78.8 (L) 11/27/2019   PLT 129 (L) 11/27/2019   CMP Latest Ref Rng & Units 11/26/2019  Glucose 70 - 99 mg/dL 109(H)  BUN 6 - 20 mg/dL 6  Creatinine 0.44 - 1.00 mg/dL 0.62  Sodium 135 - 145 mmol/L 134(L)  Potassium 3.5 - 5.1 mmol/L 3.4(L)  Chloride 98 - 111 mmol/L 104  CO2 22 - 32 mmol/L 20(L)  Calcium  8.9 - 10.3 mg/dL 8.7(L)  Total Protein 6.5 - 8.1 g/dL 5.8(L)  Total Bilirubin 0.3 - 1.2 mg/dL 0.3  Alkaline Phos 38 - 126 U/L 123  AST 15 - 41 U/L 21  ALT 0 - 44 U/L 9   Edinburgh Score: Edinburgh Postnatal Depression Scale Screening Tool 11/27/2019  I have been able to laugh and see the funny side of things. 0  I have looked forward with enjoyment to things. 0  I have blamed myself unnecessarily when things went wrong. 3  I have been anxious or worried for no good reason. 3  I have felt scared or panicky for no good reason. 1  Things have been getting on top of me. 0  I have been so unhappy that I have had difficulty sleeping. 0  I have felt sad or miserable. 0  I have been so unhappy that I have been crying. 0  The thought of harming myself has occurred to me. 0  Edinburgh Postnatal Depression Scale Total 7    Discharge instruction: per After Visit Summary and "Baby and Me Booklet".  After visit meds:  Allergies as of 11/28/2019   No Known Allergies     Medication List    STOP taking these medications   oxyCODONE-acetaminophen 5-325 MG tablet Commonly known as: PERCOCET/ROXICET   Slow Release Iron 45 MG Tbcr Generic  drug: Ferrous Sulfate Dried     TAKE these medications   albuterol 108 (90 Base) MCG/ACT inhaler Commonly known as: VENTOLIN HFA Inhale 2 puffs into the lungs every 6 (six) hours as needed. For shortness of breath   albuterol (2.5 MG/3ML) 0.083% nebulizer solution Commonly known as: PROVENTIL Take 6 mLs (5 mg total) by nebulization every 6 (six) hours as needed for wheezing.   APAP 325 MG tablet Take 2 tablets (650 mg total) by mouth every 6 (six) hours as needed for mild pain or headache. What changed:   medication strength  how much to take   ferrous sulfate 325 (65 FE) MG tablet Take 1 tablet (325 mg total) by mouth every other day.   ibuprofen 600 MG tablet Commonly known as: ADVIL Take 1 tablet (600 mg total) by mouth every 6 (six) hours as needed. What changed:   when to take this  reasons to take this   norethindrone 0.35 MG tablet Commonly known as: MICRONOR Take 1 tablet (0.35 mg total) by mouth daily.   prenatal multivitamin Tabs tablet Take 1 tablet by mouth daily at 12 noon.       Diet: routine diet  Activity: Advance as tolerated. Pelvic rest for 6 weeks.   Outpatient follow up:6 weeks at [facility] Follow up Appt:No future appointments. Follow up Visit: Please schedule this patient for Postpartum visit in: 6 weeks with the following provider: Any provider In-Person For C/S patients schedule nurse incision check in weeks 2 weeks: no Low risk pregnancy complicated by: no prenatal care Delivery mode:  SVD Anticipated Birth Control:  POPs PP Procedures needed: None  Schedule Integrated BH visit: yes   Newborn Data: Live born female  Birth Weight: 8 lb 6.9 oz (3825 g) APGAR: 8, 9  Newborn Delivery   Birth date/time: 11/26/2019 10:04:00 Delivery type: Vaginal, Spontaneous      Baby Feeding: Breast Disposition:home with mother   11/28/2019 Chauncey Mann, MD

## 2019-11-27 LAB — CBC
HCT: 25.6 % — ABNORMAL LOW (ref 36.0–46.0)
Hemoglobin: 7.8 g/dL — ABNORMAL LOW (ref 12.0–15.0)
MCH: 24 pg — ABNORMAL LOW (ref 26.0–34.0)
MCHC: 30.5 g/dL (ref 30.0–36.0)
MCV: 78.8 fL — ABNORMAL LOW (ref 80.0–100.0)
Platelets: 129 10*3/uL — ABNORMAL LOW (ref 150–400)
RBC: 3.25 MIL/uL — ABNORMAL LOW (ref 3.87–5.11)
RDW: 16.1 % — ABNORMAL HIGH (ref 11.5–15.5)
WBC: 9.4 10*3/uL (ref 4.0–10.5)
nRBC: 0 % (ref 0.0–0.2)

## 2019-11-27 LAB — RPR: RPR Ser Ql: NONREACTIVE

## 2019-11-27 LAB — RUBELLA ANTIBODY, IGM: Rubella IgM: <20 AU/mL (ref 0.0–19.9)

## 2019-11-27 NOTE — Progress Notes (Addendum)
POSTPARTUM PROGRESS NOTE  Post Partum Day 1  Subjective:  Gabrielle Nguyen is a 24 y.o. [id number] s/p SVD at [redacted]w[redacted]d.  She reports she is doing well. No acute events overnight. She denies any problems with ambulating, voiding or po intake. Denies nausea or vomiting.  Pain is well controlled.  Lochia is appropriate and improving.  Objective: Blood pressure (!) 100/59, pulse (!) 54, temperature 98.3 F (36.8 C), temperature source Oral, resp. rate 18, height 5\' 6"  (1.676 m), weight 67.1 kg, last menstrual period 01/30/2019, SpO2 97 %, unknown if currently breastfeeding.  Physical Exam:  General: alert, cooperative and no distress Chest: no respiratory distress Heart:regular rate, distal pulses intact Abdomen: soft, nontender Uterine Fundus: firm, appropriately tender DVT Evaluation: No calf swelling or tenderness Extremities: no edema Skin: warm, dry  Recent Labs    11/26/19 1038  HGB 8.4*  HCT 28.4*    Assessment/Plan: Gabrielle Nguyen is a 24 y.o. 22 s/p SVD at [redacted]w[redacted]d   PPD#1 - Doing well. Continue routine postpartum care.  Contraception: POP's Feeding: Breast No PNC: SW has spoken with pediatrician and [facility] CPS; [name] has no open cases with CPS. SW plans to talk to mom again to gather more details on needs. Dispo: Plan for discharge tomorrow.   LOS: 1 day    [redacted]w[redacted]d MD, PGY-1 OBGYN Faculty Teaching Service  11/27/2019, 5:07 AM   I saw and evaluated the patient. I agree with the findings and the plan of care as documented in the resident's note. UDS with benzos/opiods. OB panel reviewed and WNL. A1c 5.8; f/u outpatient. DC tomorrow.   11/29/2019, MD [facility] Family Medicine Fellow, [facility] for [facility]

## 2019-11-27 NOTE — Progress Notes (Signed)
CSW made [place] CPS report for infant's positive UDS for opiates. CPS Investigator D. Wilder visited [name] at bedside for assessment. D. Wilder informed CSW there are barriers to discharge. D. Wilder and CSW met with infant's RN to determine discharge timeline. After calling doctor, RN confirmed baby would not be discharged until Monday. D. Wilder stated plans to update CSW with safe discharge plan.     D. , MSW, LCSWA Clinical Social Worker [phone] 
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
## 2019-11-28 ENCOUNTER — Encounter (HOSPITAL_COMMUNITY): Payer: Self-pay | Admitting: Family Medicine

## 2019-11-28 ENCOUNTER — Other Ambulatory Visit: Payer: Self-pay

## 2019-11-28 MED ORDER — NORETHINDRONE 0.35 MG PO TABS: 1.0000 | ORAL_TABLET | Freq: Every day | ORAL | 11 refills | Status: DC

## 2019-11-28 MED ORDER — IBUPROFEN 600 MG PO TABS: 600.0000 mg | ORAL_TABLET | Freq: Four times a day (QID) | ORAL | 1 refills | Status: DC | PRN

## 2019-11-28 MED ORDER — APAP 325 MG PO TABS: 650.0000 mg | ORAL_TABLET | Freq: Four times a day (QID) | ORAL | 0 refills | Status: DC | PRN

## 2019-11-28 MED ORDER — FERROUS SULFATE 325 (65 FE) MG PO TABS: 325.0000 mg | ORAL_TABLET | ORAL | 0 refills | Status: DC

## 2019-11-28 NOTE — Lactation Note (Addendum)
This note was copied from a baby's chart. Lactation Consultation Note  Patient Name: Gabrielle Nguyen Today's Date: 11/28/2019  P2, 41 hour term female infant -3% weight loss. Infant with positive  UDS for opiates.  Per mom, she breastfeed infant 5 times, but there is no documentation of any breastfeeding events , infant is biting at breast, she mostly have been formula feeding. Per mom, she breastfed her 26 year old son for 8 months. Mom has DEBP in room with Kit but has no started using  pump yet. Per mom, she is in a lot of pain, mom was attempting to latch infant at breast, but started crying  due to  stomach pain, saying she delivered infant naturally and has endometriosis. LC alerted RN and mom was given tylenol and heating packs. Mom will attempt to use DEBP every 3 hours for 15 minutes, due infant cuing and mom with abdominal pain, dad gave infant formula at this time. Mom knows to call RN or LC if she decides to latch infant at breast and needs assistance with latch.       Maternal Data    Feeding    LATCH Score                   Interventions    Lactation Tools Discussed/Used     Consult Status        11/28/2019, 3:04 AM    

## 2019-11-29 ENCOUNTER — Ambulatory Visit: Payer: Self-pay

## 2019-11-29 NOTE — Lactation Note (Addendum)
This note was copied from a baby's chart. Lactation Consultation Note  Patient Name: Gabrielle Nguyen Today's Date: 11/29/2019   Verline Lema, RN let me know that Mom was engorged & missing an essential piece for her to use the DEBP. I met Mom & confirmed the same. I brought in a new DEBP kit & got Mom pumping with size 27 flanges, which were a good fit, initially.   Mom reports she has been engorged since last night but was able to leak from both breasts, until recently. I assisted with turning up the suction to Mom's tolerance & washing her pump parts.   When I returned with ice for Mom's breasts, the "initiation" cycle had completed & Mom had turned it back on b/c she wanted to try and relieve her discomfort more. At that point, her nipples had enlarged & she needed a larger flange size. I provided her with size 30 flanges which accommodated her nipples well, but it made little difference to the amount being expressed. I persuaded Mom to stop pumping and to use this opportunity to apply the ice pack to different portions of her breasts. The total amount expressed was 15 mL.   I brought in shells in the hopes that they would help Mom to leak some, but the base of her nipple was at risk for being chafed due to the increase in nipple size from the extended pumping. I explained how to use them & told Mom she could wear them once her nipple size decreased some.    I advised Mom not to pump for longer than 15-20 minutes. I told Mom that she could take acetaminophen & ibuprofen (if OK'd by her OB) for engorgement. Mom verbalized it was OK for her to take IB. The FOB will go out and get some. Note: Mom took 2 tablets of acetaminophen with caffeine this morning at 0700.  Matthias Hughs [facility] 11/29/2019, 11:50 AM

## 2020-07-03 ENCOUNTER — Other Ambulatory Visit: Payer: Self-pay

## 2020-07-03 ENCOUNTER — Encounter (HOSPITAL_COMMUNITY): Payer: Self-pay

## 2020-07-03 ENCOUNTER — Inpatient Hospital Stay (HOSPITAL_COMMUNITY)
Admission: EM | Admit: 2020-07-03 | Discharge: 2020-07-06 | DRG: 897 | Disposition: A | Discharge status: Home / Self Care | Payer: Medicaid Other | Attending: Internal Medicine | Admitting: Internal Medicine

## 2020-07-03 DIAGNOSIS — R112 Nausea with vomiting, unspecified: Secondary | ICD-10-CM | POA: Diagnosis present

## 2020-07-03 DIAGNOSIS — F410 Panic disorder [episodic paroxysmal anxiety] without agoraphobia: Secondary | ICD-10-CM | POA: Diagnosis present

## 2020-07-03 DIAGNOSIS — F1123 Opioid dependence with withdrawal: Principal | ICD-10-CM | POA: Diagnosis present

## 2020-07-03 DIAGNOSIS — Z87891 Personal history of nicotine dependence: Secondary | ICD-10-CM

## 2020-07-03 DIAGNOSIS — F4310 Post-traumatic stress disorder, unspecified: Secondary | ICD-10-CM | POA: Diagnosis present

## 2020-07-03 DIAGNOSIS — E872 Acidosis, unspecified: Secondary | ICD-10-CM

## 2020-07-03 DIAGNOSIS — F1193 Opioid use, unspecified with withdrawal: Secondary | ICD-10-CM

## 2020-07-03 DIAGNOSIS — F329 Major depressive disorder, single episode, unspecified: Secondary | ICD-10-CM | POA: Diagnosis present

## 2020-07-03 DIAGNOSIS — N179 Acute kidney failure, unspecified: Secondary | ICD-10-CM | POA: Diagnosis present

## 2020-07-03 DIAGNOSIS — E860 Dehydration: Secondary | ICD-10-CM | POA: Diagnosis present

## 2020-07-03 DIAGNOSIS — E876 Hypokalemia: Secondary | ICD-10-CM | POA: Diagnosis present

## 2020-07-03 DIAGNOSIS — Z20822 Contact with and (suspected) exposure to covid-19: Secondary | ICD-10-CM | POA: Diagnosis present

## 2020-07-03 LAB — COMPREHENSIVE METABOLIC PANEL
ALT: 13 U/L (ref 0–44)
AST: 28 U/L (ref 15–41)
Albumin: 4.7 g/dL (ref 3.5–5.0)
Alkaline Phosphatase: 89 U/L (ref 38–126)
Anion gap: 24 — ABNORMAL HIGH (ref 5–15)
BUN: 19 mg/dL (ref 6–20)
CO2: 18 mmol/L — ABNORMAL LOW (ref 22–32)
Calcium: 10.1 mg/dL (ref 8.9–10.3)
Chloride: 99 mmol/L (ref 98–111)
Creatinine, Ser: 1.37 mg/dL — ABNORMAL HIGH (ref 0.44–1.00)
GFR, Estimated: 55 mL/min — ABNORMAL LOW (ref >60)
Glucose, Bld: 94 mg/dL (ref 70–99)
Potassium: 2.9 mmol/L — ABNORMAL LOW (ref 3.5–5.1)
Sodium: 141 mmol/L (ref 135–145)
Total Bilirubin: 1.4 mg/dL — ABNORMAL HIGH (ref 0.3–1.2)
Total Protein: 9.1 g/dL — ABNORMAL HIGH (ref 6.5–8.1)

## 2020-07-03 LAB — CBC
HCT: 39.9 % (ref 36.0–46.0)
Hemoglobin: 12.7 g/dL (ref 12.0–15.0)
MCH: 26.3 pg (ref 26.0–34.0)
MCHC: 31.8 g/dL (ref 30.0–36.0)
MCV: 82.8 fL (ref 80.0–100.0)
Platelets: 344 10*3/uL (ref 150–400)
RBC: 4.82 MIL/uL (ref 3.87–5.11)
RDW: 19.1 % — ABNORMAL HIGH (ref 11.5–15.5)
WBC: 13.1 10*3/uL — ABNORMAL HIGH (ref 4.0–10.5)
nRBC: 0 % (ref 0.0–0.2)

## 2020-07-03 LAB — I-STAT BETA HCG BLOOD, ED (MC, WL, AP ONLY): I-stat hCG, quantitative: 27.3 m[IU]/mL — ABNORMAL HIGH (ref <5)

## 2020-07-03 LAB — ETHANOL: Alcohol, Ethyl (B): <10 mg/dL (ref <10)

## 2020-07-03 NOTE — ED Triage Notes (Signed)
Pt BIB [facility] for withdrawal from Opioids, pt unable to get to her Methadone since yesterday. Pt also "over using" her netty pot the last 2 weeks, also low grade fever, 100.6. Pt was a heavy narcotic abuser. Pt c/o generalized body aches  BP 136/70, SBP 117 HR 125 RR 24

## 2020-07-03 NOTE — ED Notes (Signed)
EMS IV removed per Rn request.

## 2020-07-04 DIAGNOSIS — F1123 Opioid dependence with withdrawal: Secondary | ICD-10-CM | POA: Diagnosis not present

## 2020-07-04 DIAGNOSIS — R112 Nausea with vomiting, unspecified: Secondary | ICD-10-CM | POA: Diagnosis not present

## 2020-07-04 DIAGNOSIS — F410 Panic disorder [episodic paroxysmal anxiety] without agoraphobia: Secondary | ICD-10-CM

## 2020-07-04 DIAGNOSIS — N179 Acute kidney failure, unspecified: Secondary | ICD-10-CM | POA: Diagnosis not present

## 2020-07-04 LAB — RAPID URINE DRUG SCREEN, HOSP PERFORMED
Amphetamines: NOT DETECTED
Barbiturates: NOT DETECTED
Benzodiazepines: POSITIVE — AB
Cocaine: NOT DETECTED
Opiates: POSITIVE — AB
Tetrahydrocannabinol: NOT DETECTED

## 2020-07-04 LAB — I-STAT VENOUS BLOOD GAS, ED
Acid-base deficit: 8 mmol/L — ABNORMAL HIGH (ref 0.0–2.0)
Bicarbonate: 14.7 mmol/L — ABNORMAL LOW (ref 20.0–28.0)
Calcium, Ion: 1.08 mmol/L — ABNORMAL LOW (ref 1.15–1.40)
HCT: 31 % — ABNORMAL LOW (ref 36.0–46.0)
Hemoglobin: 10.5 g/dL — ABNORMAL LOW (ref 12.0–15.0)
O2 Saturation: 85 %
Potassium: 3 mmol/L — ABNORMAL LOW (ref 3.5–5.1)
Sodium: 145 mmol/L (ref 135–145)
TCO2: 15 mmol/L — ABNORMAL LOW (ref 22–32)
pCO2, Ven: 22.7 mmHg — ABNORMAL LOW (ref 44.0–60.0)
pH, Ven: 7.418 (ref 7.250–7.430)
pO2, Ven: 47 mmHg — ABNORMAL HIGH (ref 32.0–45.0)

## 2020-07-04 LAB — CBC WITH DIFFERENTIAL/PLATELET
Abs Immature Granulocytes: 0.05 10*3/uL (ref 0.00–0.07)
Basophils Absolute: 0 10*3/uL (ref 0.0–0.1)
Basophils Relative: 0 %
Eosinophils Absolute: 0 10*3/uL (ref 0.0–0.5)
Eosinophils Relative: 0 %
HCT: 32 % — ABNORMAL LOW (ref 36.0–46.0)
Hemoglobin: 10.1 g/dL — ABNORMAL LOW (ref 12.0–15.0)
Immature Granulocytes: 1 %
Lymphocytes Relative: 9 %
Lymphs Abs: 0.9 10*3/uL (ref 0.7–4.0)
MCH: 26.4 pg (ref 26.0–34.0)
MCHC: 31.6 g/dL (ref 30.0–36.0)
MCV: 83.8 fL (ref 80.0–100.0)
Monocytes Absolute: 0.6 10*3/uL (ref 0.1–1.0)
Monocytes Relative: 6 %
Neutro Abs: 8.1 10*3/uL — ABNORMAL HIGH (ref 1.7–7.7)
Neutrophils Relative %: 84 %
Platelets: 257 10*3/uL (ref 150–400)
RBC: 3.82 MIL/uL — ABNORMAL LOW (ref 3.87–5.11)
RDW: 19 % — ABNORMAL HIGH (ref 11.5–15.5)
WBC: 9.6 10*3/uL (ref 4.0–10.5)
nRBC: 0 % (ref 0.0–0.2)

## 2020-07-04 LAB — MAGNESIUM: Magnesium: 2.1 mg/dL (ref 1.7–2.4)

## 2020-07-04 LAB — LACTIC ACID, PLASMA: Lactic Acid, Venous: 0.7 mmol/L (ref 0.5–1.9)

## 2020-07-04 LAB — BASIC METABOLIC PANEL
Anion gap: 21 — ABNORMAL HIGH (ref 5–15)
BUN: 21 mg/dL — ABNORMAL HIGH (ref 6–20)
CO2: 12 mmol/L — ABNORMAL LOW (ref 22–32)
Calcium: 8.6 mg/dL — ABNORMAL LOW (ref 8.9–10.3)
Chloride: 108 mmol/L (ref 98–111)
Creatinine, Ser: 1.12 mg/dL — ABNORMAL HIGH (ref 0.44–1.00)
GFR, Estimated: >60 mL/min (ref >60)
Glucose, Bld: 82 mg/dL (ref 70–99)
Potassium: 3.2 mmol/L — ABNORMAL LOW (ref 3.5–5.1)
Sodium: 141 mmol/L (ref 135–145)

## 2020-07-04 LAB — PHOSPHORUS: Phosphorus: 3.5 mg/dL (ref 2.5–4.6)

## 2020-07-04 LAB — POC URINE PREG, ED: Preg Test, Ur: NEGATIVE

## 2020-07-04 LAB — TSH: TSH: 0.637 u[IU]/mL (ref 0.350–4.500)

## 2020-07-04 LAB — RESPIRATORY PANEL BY RT PCR (FLU A&B, COVID)
Influenza A by PCR: NEGATIVE
Influenza B by PCR: NEGATIVE
SARS Coronavirus 2 by RT PCR: NEGATIVE

## 2020-07-04 LAB — LIPASE, BLOOD: Lipase: 26 U/L (ref 11–51)

## 2020-07-04 MED ORDER — FOLIC ACID 1 MG PO TABS
1.0000 mg | ORAL_TABLET | Freq: Every day | ORAL | Status: DC
  Administered 2020-07-04 – 2020-07-06 (×3): 1 mg via ORAL
  Filled 2020-07-04 (×3): qty 1

## 2020-07-04 MED ORDER — SODIUM CHLORIDE 0.9 % IV BOLUS
1000.0000 mL | Freq: Once | INTRAVENOUS | Status: AC
  Administered 2020-07-04: 1000 mL via INTRAVENOUS

## 2020-07-04 MED ORDER — LACTATED RINGERS IV BOLUS
1000.0000 mL | Freq: Once | INTRAVENOUS | Status: AC
  Administered 2020-07-04: 1000 mL via INTRAVENOUS

## 2020-07-04 MED ORDER — METHADONE HCL 10 MG PO TABS
130.0000 mg | ORAL_TABLET | Freq: Every day | ORAL | Status: DC
  Administered 2020-07-04: 40 mg via ORAL
  Filled 2020-07-04 (×2): qty 13

## 2020-07-04 MED ORDER — ALPRAZOLAM 0.25 MG PO TABS: 1.0000 mg | ORAL_TABLET | Freq: Three times a day (TID) | ORAL | Status: DC | PRN

## 2020-07-04 MED ORDER — ENOXAPARIN SODIUM 40 MG/0.4ML ~~LOC~~ SOLN
40.0000 mg | Freq: Every day | SUBCUTANEOUS | Status: DC
  Administered 2020-07-04 – 2020-07-06 (×3): 40 mg via SUBCUTANEOUS
  Filled 2020-07-04 (×3): qty 0.4

## 2020-07-04 MED ORDER — LACTATED RINGERS IV SOLN: INTRAVENOUS | Status: DC

## 2020-07-04 MED ORDER — ONDANSETRON HCL 4 MG/2ML IJ SOLN
4.0000 mg | Freq: Once | INTRAMUSCULAR | Status: AC
  Administered 2020-07-04: 4 mg via INTRAVENOUS
  Filled 2020-07-04: qty 2

## 2020-07-04 MED ORDER — ACETAMINOPHEN 325 MG PO TABS
650.0000 mg | ORAL_TABLET | Freq: Four times a day (QID) | ORAL | Status: DC | PRN
  Filled 2020-07-04: qty 2

## 2020-07-04 MED ORDER — ONDANSETRON HCL 4 MG/2ML IJ SOLN
4.0000 mg | Freq: Four times a day (QID) | INTRAMUSCULAR | Status: DC | PRN
  Administered 2020-07-04 – 2020-07-05 (×3): 4 mg via INTRAVENOUS
  Filled 2020-07-04 (×4): qty 2

## 2020-07-04 MED ORDER — ACETAMINOPHEN 650 MG RE SUPP: 650.0000 mg | Freq: Four times a day (QID) | RECTAL | Status: DC | PRN

## 2020-07-04 MED ORDER — POTASSIUM CHLORIDE 20 MEQ PO PACK
40.0000 meq | PACK | Freq: Once | ORAL | Status: AC
  Administered 2020-07-04: 40 meq via ORAL
  Filled 2020-07-04: qty 2

## 2020-07-04 MED ORDER — THIAMINE HCL 100 MG/ML IJ SOLN
100.0000 mg | Freq: Every day | INTRAMUSCULAR | Status: DC
  Administered 2020-07-04: 100 mg via INTRAVENOUS
  Filled 2020-07-04: qty 2

## 2020-07-04 MED ORDER — POTASSIUM CHLORIDE CRYS ER 20 MEQ PO TBCR: 40.0000 meq | EXTENDED_RELEASE_TABLET | Freq: Once | ORAL | Status: DC

## 2020-07-04 MED ORDER — ACETAMINOPHEN 500 MG PO TABS
1000.0000 mg | ORAL_TABLET | Freq: Once | ORAL | Status: AC
  Administered 2020-07-04: 500 mg via ORAL
  Filled 2020-07-04: qty 2

## 2020-07-04 MED ORDER — ADULT MULTIVITAMIN W/MINERALS CH
1.0000 | ORAL_TABLET | Freq: Every day | ORAL | Status: DC
  Administered 2020-07-04 – 2020-07-06 (×3): 1 via ORAL
  Filled 2020-07-04 (×3): qty 1

## 2020-07-04 MED ORDER — THIAMINE HCL 100 MG PO TABS
100.0000 mg | ORAL_TABLET | Freq: Every day | ORAL | Status: DC
  Administered 2020-07-05 – 2020-07-06 (×2): 100 mg via ORAL
  Filled 2020-07-04 (×2): qty 1

## 2020-07-04 MED ORDER — ALBUTEROL SULFATE (2.5 MG/3ML) 0.083% IN NEBU: 5.0000 mg | INHALATION_SOLUTION | Freq: Four times a day (QID) | RESPIRATORY_TRACT | Status: DC | PRN

## 2020-07-04 MED ORDER — POTASSIUM CHLORIDE 20 MEQ/15ML (10%) PO SOLN: 40.0000 meq | Freq: Once | ORAL | Status: DC

## 2020-07-04 MED ORDER — PROMETHAZINE HCL 25 MG/ML IJ SOLN
12.5000 mg | Freq: Once | INTRAMUSCULAR | Status: AC
  Administered 2020-07-04: 12.5 mg via INTRAVENOUS
  Filled 2020-07-04: qty 1

## 2020-07-04 MED ORDER — ONDANSETRON HCL 4 MG PO TABS: 4.0000 mg | ORAL_TABLET | Freq: Four times a day (QID) | ORAL | Status: DC | PRN

## 2020-07-04 MED ORDER — POTASSIUM CHLORIDE 10 MEQ/100ML IV SOLN
10.0000 meq | Freq: Once | INTRAVENOUS | Status: AC
  Administered 2020-07-04: 10 meq via INTRAVENOUS
  Filled 2020-07-04: qty 100

## 2020-07-04 NOTE — Progress Notes (Signed)
UPDATE NOTE:  Handoff received from Dr. Nedra Hai. 24 yo female with history of substance use disorder (on methadone) who presented to [facility] on 07/04/20 for n/v and was found to be in opioid withdrawal. She was admitted to our service for inability to tolerate po's and hypokalemia. She had noted to Dr. Nedra Hai that she was in withdrawal due to being unable to afford methadone since 10/31. Last heroin use was a couple days prior to admission via snorting.   I saw pt at bedside to discuss alternative treatment options. She appeared acutely ill and in active withdrawal. I briefly reviewed her issues with obtaining methadone. She notes that she is paying $25/day for methadone which is not financially feasible for her.  We discussed an alternative treatment for OUD and opioid withdrawal with suboxone. She denies prior suboxone use. I did also explain that this could be continued on an outpatient basis with [facility] providers.  Pt expressed interest and would like to proceed with starting suboxone this hospitalization.  Of note, upon entering the room, pt was in the process of taking her first dose of methadone as ordered on admission. Bedside RN notes that she had taken 40mg .  Plan: Will hold off on starting suboxone today as she has already taken some methadone, increasing risk for worsening withdrawal sympmtoms if taken in conjunction with suboxone. Instructed nurse to give the complete methadone dose for today as ordered by Dr. . --Will give first suboxone dose once she is at least 24h out from last opioid intake and COW >14. Time at my visit with her, when she was receiving her methadone, was approx 1430 this afternoon.  --Plan discussed with pt. She expressed understanding and was in agreement.  Suboxone initiation plan and follow up discussed with Dr. Nedra Hai. Greatly appreciate her assistance with this.  Criselda Peaches, MD Internal Medicine Resident  PGY-2 [facility] Pager: [phone] 07/04/2020 7:27 PM
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
## 2020-07-04 NOTE — ED Notes (Signed)
Result for POC preg urine is not showing, tech went into mini lab and spoke with phlebotomy who showed me her test was negative
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
## 2020-07-05 ENCOUNTER — Encounter (HOSPITAL_COMMUNITY): Payer: Self-pay | Admitting: Internal Medicine

## 2020-07-05 DIAGNOSIS — F1123 Opioid dependence with withdrawal: Secondary | ICD-10-CM | POA: Diagnosis present

## 2020-07-05 DIAGNOSIS — F1193 Opioid use, unspecified with withdrawal: Secondary | ICD-10-CM | POA: Diagnosis present

## 2020-07-05 DIAGNOSIS — Z20822 Contact with and (suspected) exposure to covid-19: Secondary | ICD-10-CM | POA: Diagnosis present

## 2020-07-05 DIAGNOSIS — F329 Major depressive disorder, single episode, unspecified: Secondary | ICD-10-CM | POA: Diagnosis present

## 2020-07-05 DIAGNOSIS — E872 Acidosis: Secondary | ICD-10-CM | POA: Diagnosis not present

## 2020-07-05 DIAGNOSIS — E876 Hypokalemia: Secondary | ICD-10-CM | POA: Diagnosis present

## 2020-07-05 DIAGNOSIS — F410 Panic disorder [episodic paroxysmal anxiety] without agoraphobia: Secondary | ICD-10-CM | POA: Diagnosis present

## 2020-07-05 DIAGNOSIS — N179 Acute kidney failure, unspecified: Secondary | ICD-10-CM | POA: Diagnosis present

## 2020-07-05 DIAGNOSIS — F39 Unspecified mood [affective] disorder: Secondary | ICD-10-CM

## 2020-07-05 DIAGNOSIS — E860 Dehydration: Secondary | ICD-10-CM | POA: Diagnosis present

## 2020-07-05 DIAGNOSIS — Z87891 Personal history of nicotine dependence: Secondary | ICD-10-CM | POA: Diagnosis not present

## 2020-07-05 DIAGNOSIS — F4310 Post-traumatic stress disorder, unspecified: Secondary | ICD-10-CM | POA: Diagnosis present

## 2020-07-05 LAB — COMPREHENSIVE METABOLIC PANEL
ALT: 12 U/L (ref 0–44)
AST: 28 U/L (ref 15–41)
Albumin: 3.7 g/dL (ref 3.5–5.0)
Alkaline Phosphatase: 59 U/L (ref 38–126)
Anion gap: 15 (ref 5–15)
BUN: 9 mg/dL (ref 6–20)
CO2: 18 mmol/L — ABNORMAL LOW (ref 22–32)
Calcium: 9.7 mg/dL (ref 8.9–10.3)
Chloride: 111 mmol/L (ref 98–111)
Creatinine, Ser: 0.84 mg/dL (ref 0.44–1.00)
GFR, Estimated: >60 mL/min (ref >60)
Glucose, Bld: 82 mg/dL (ref 70–99)
Potassium: 3.3 mmol/L — ABNORMAL LOW (ref 3.5–5.1)
Sodium: 144 mmol/L (ref 135–145)
Total Bilirubin: 0.7 mg/dL (ref 0.3–1.2)
Total Protein: 7.1 g/dL (ref 6.5–8.1)

## 2020-07-05 LAB — GLUCOSE, CAPILLARY: Glucose-Capillary: 88 mg/dL (ref 70–99)

## 2020-07-05 LAB — CBC
HCT: 31 % — ABNORMAL LOW (ref 36.0–46.0)
Hemoglobin: 10.1 g/dL — ABNORMAL LOW (ref 12.0–15.0)
MCH: 26.9 pg (ref 26.0–34.0)
MCHC: 32.6 g/dL (ref 30.0–36.0)
MCV: 82.7 fL (ref 80.0–100.0)
Platelets: 220 10*3/uL (ref 150–400)
RBC: 3.75 MIL/uL — ABNORMAL LOW (ref 3.87–5.11)
RDW: 19.7 % — ABNORMAL HIGH (ref 11.5–15.5)
WBC: 9.5 10*3/uL (ref 4.0–10.5)
nRBC: 0 % (ref 0.0–0.2)

## 2020-07-05 LAB — PHOSPHORUS: Phosphorus: 2.3 mg/dL — ABNORMAL LOW (ref 2.5–4.6)

## 2020-07-05 LAB — MAGNESIUM: Magnesium: 2.1 mg/dL (ref 1.7–2.4)

## 2020-07-05 MED ORDER — POTASSIUM & SODIUM PHOSPHATES 280-160-250 MG PO PACK
2.0000 | PACK | Freq: Three times a day (TID) | ORAL | Status: AC
  Administered 2020-07-05: 2 via ORAL
  Filled 2020-07-05 (×3): qty 2

## 2020-07-05 MED ORDER — SERTRALINE HCL 100 MG PO TABS
100.0000 mg | ORAL_TABLET | Freq: Every day | ORAL | Status: DC
  Administered 2020-07-05 – 2020-07-06 (×2): 100 mg via ORAL
  Filled 2020-07-05 (×2): qty 1

## 2020-07-05 MED ORDER — BUPRENORPHINE HCL-NALOXONE HCL 2-0.5 MG SL SUBL
1.0000 | SUBLINGUAL_TABLET | SUBLINGUAL | Status: AC
  Administered 2020-07-05 (×2): 1 via SUBLINGUAL
  Filled 2020-07-05 (×2): qty 1

## 2020-07-05 MED ORDER — POTASSIUM CHLORIDE 20 MEQ PO PACK
40.0000 meq | PACK | Freq: Once | ORAL | Status: AC
  Administered 2020-07-05: 40 meq via ORAL
  Filled 2020-07-05: qty 2

## 2020-07-05 MED ORDER — BUPRENORPHINE HCL-NALOXONE HCL 8-2 MG SL SUBL
1.0000 | SUBLINGUAL_TABLET | Freq: Two times a day (BID) | SUBLINGUAL | Status: DC
  Administered 2020-07-05 – 2020-07-06 (×2): 1 via SUBLINGUAL
  Filled 2020-07-05 (×3): qty 1

## 2020-07-05 MED ORDER — POTASSIUM CHLORIDE CRYS ER 20 MEQ PO TBCR: 40.0000 meq | EXTENDED_RELEASE_TABLET | Freq: Two times a day (BID) | ORAL | Status: DC

## 2020-07-05 NOTE — Progress Notes (Signed)
Md Amponsah notified me that the patient is on every 6 hour CIWA assessments instead of hourly. Patient was given her Suboxone. Will continue to monitor closely for safety.

## 2020-07-05 NOTE — Progress Notes (Signed)
Subjective:   Ms.Butikofer is a 24 year old female with past medical history of opioid use disorder (on methadone) and anxiety presenting with intractable nausea and vomiting due to opioid withdrawal.  Overnight, patient was having worsening withdrawal symptoms of chills, nausea, diaphoresis, difficulty sleeping, and myalgias. CIWA 26 and COWS 15. Patient received zofran for nausea and suboxone 2-0.5mg  1 tablet Q2H x2 doses was given. Following suboxone administration, patient's COWS down to 7 and she reported feeling better with resolution of her headache and nausea.  This morning, patient states that she is feeling better. She complains of mild, tolerable, generalized pain. She denies N/V, trouble eating, or diaphoresis. She agrees to the plan to continue with suboxone and follow-up with our clinic for further management of her condition and medication.  Objective:  Vital signs in last 24 hours: Vitals:   07/04/20 2250 07/05/20 0200 07/05/20 0330 07/05/20 0829  BP: 116/77 (!) 124/91 117/74 127/71  Pulse: 96 97 97 (!) 105  Resp: (!) 23 (!) 23 20 20   Temp: 98.7 F (37.1 C)  98.5 F (36.9 C) 98.4 F (36.9 C)  TempSrc: Oral  Oral Oral  SpO2: 98% 100% 98% 98%  Weight:   56.2 kg   Height:      SpO2: 98 %  Intake/Output Summary (Last 24 hours) at 07/05/2020 1056 Last data filed at 07/05/2020 0626 Gross per 24 hour  Intake 1700 ml  Output 600 ml  Net 1100 ml   Filed Weights   07/04/20 2214 07/05/20 0330  Weight: 57.2 kg 56.2 kg   Physical Exam Vitals and nursing note reviewed. Exam conducted with a chaperone present.  Constitutional:      General: She is not in acute distress.    Appearance: Normal appearance. She is not toxic-appearing.  HENT:     Head: Normocephalic and atraumatic.  Eyes:     Extraocular Movements: Extraocular movements intact.     Conjunctiva/sclera: Conjunctivae normal.  Cardiovascular:     Rate and Rhythm: Normal rate and regular rhythm.     Pulses:  Normal pulses.     Heart sounds: Normal heart sounds.  Pulmonary:     Effort: Pulmonary effort is normal.     Breath sounds: Normal breath sounds.  Abdominal:     General: Abdomen is flat. Bowel sounds are normal.     Palpations: Abdomen is soft.     Tenderness: There is no abdominal tenderness.  Musculoskeletal:        General: Normal range of motion.     Cervical back: Normal range of motion and neck supple.     Right lower leg: No edema.     Left lower leg: No edema.  Skin:    General: Skin is warm and dry.     Capillary Refill: Capillary refill takes less than 2 seconds.  Neurological:     General: No focal deficit present.     Mental Status: Mental status is at baseline.    CBC Latest Ref Rng & Units 07/05/2020 07/04/2020 07/04/2020  WBC 4.0 - 10.5 K/uL 9.5 - 9.6  Hemoglobin 12.0 - 15.0 g/dL 10.1(L) 10.5(L) 10.1(L)  Hematocrit 36 - 46 % 31.0(L) 31.0(L) 32.0(L)  Platelets 150 - 400 K/uL 220 - 257   CMP Latest Ref Rng & Units 07/05/2020 07/04/2020 07/04/2020  Glucose 70 - 99 mg/dL 82 82 -  BUN 6 - 20 mg/dL 9 13/10/2019) -  Creatinine 03(E - 1.00 mg/dL 0.92 3.30) -  Sodium 135 - 145 mmol/L 144  141 145  Potassium 3.5 - 5.1 mmol/L 3.3(L) 3.2(L) 3.0(L)  Chloride 98 - 111 mmol/L 111 108 -  CO2 22 - 32 mmol/L 18(L) 12(L) -  Calcium 8.9 - 10.3 mg/dL 9.7 8.5(I) -  Total Protein 6.5 - 8.1 g/dL 7.1 - -  Total Bilirubin 0.3 - 1.2 mg/dL 0.7 - -  Alkaline Phos 38 - 126 U/L 59 - -  AST 15 - 41 U/L 28 - -  ALT 0 - 44 U/L 12 - -   Magnesium - 2.1 Phosphorous - 2.3  Assessment/Plan:  Active Problems:   Intractable nausea and vomiting  Ms. Darwin Guastella is a 24 year old female with past medical history of opioid use disorder (on methadone) and anxiety who presented with intractable nausea and vomiting in the setting of opioid withdrawal.  #Opioid Use Disorder, chronic #Opioid Withdrawal, active Patient previously prescribed methadone 130mg  daily, however due to an inability to afford  her clinic visits and prescription, she relapsed and began using heroin. Following recreational use, she presented to the ED days later with significant symptoms suggestive of opioid withdrawal including nausea, vomiting, myalgias, diaphoresis, and malaise. Initially received methadone on day 1 and transitioned to suboxone overnight. She feels well this morning and is hemodynamically stable. She would benefit from continued suboxone administration and follow-up with our clinic as this plan would be affordable and safe for the patient.  -Escalate Suboxone -Zofran PRN -COWS monitoring  #Electrolyte derangements (hypokalemia, hypophosphatemia), active #Starvation ketoacidosis, active Patient's BMP today improved from prior, however electrolyte derangements still present. Potassium 3.3, Phosphorous 2.3, bicarbonate 18 and anion gap 15. Electrolyte derangements and anion gap metabolic acidosis likely secondary to starvation ketoacidosis in the setting of significant nausea and vomiting limiting oral intake. Her appetite is improving significantly with management of her opioid withdrawal. -Discontinue IVF -Replete potassium and phosphorous -Encourage advancement of diet as tolerated -Daily CMP, Mg, Phos  #Panic Disorder / Anxiety, chronic On xanax 1mg  TID prn at home, however it has been over one week since last dose. -Holding home benzodiazepines for now  #MDD, chronic On sertraline 100mg  daily at home. Currently denies suicidal / homicidal ideation. -Resume home sertraline 100mg  daily  #VTE ppx: Lovenox #Diet: Clears #IVF: LR at 165mL/hr #Code status: Full code #Bowel regimen: none   , MD 07/05/2020, 10:56 AM Pager: [phone] After 5pm on weekdays and 1pm on weekends: On Call pager [phone]

## 2020-07-05 NOTE — Plan of Care (Signed)
  Problem: Activity: Goal: Risk for activity intolerance will decrease Outcome: Progressing   Problem: Coping: Goal: Level of anxiety will decrease Outcome: Progressing   Problem: Safety: Goal: Ability to remain free from injury will improve Outcome: Progressing   

## 2020-07-05 NOTE — Progress Notes (Signed)
Patient recent COWS is 7. She verbalizes feeling better. No longer has a headache or nausea. Notified covering Md.

## 2020-07-05 NOTE — Progress Notes (Signed)
  Date: 07/05/2020  Patient name: Gabrielle Nguyen  Medical record number: [id number]  Date of birth: March 14, 1996   I have seen and evaluated [name] and discussed their care with the Residency Team.  In brief, patient is a 24 year old female with a past medical history of opiate use disorder and anxiety who presented to the ED with nausea and vomiting x1 day.  Patient is a history of ADD and has been following in the methadone clinic for the past couple of years.  Patient's last dose of methadone was on October 31.  Patient has been having difficulty affording her methadone.  Yesterday, patient began to have nausea, vomiting, diarrhea and subjective fevers with associated myalgias after being unable to get her methadone.  Patient symptoms gradually worsened and she began to have tremors and intractable emesis and diarrhea and came to the ED for further evaluation.  No chest pain, no palpitations, no diaphoresis, no lightheadedness, no syncope, no focal weakness, no tingling or numbness, no headache, no blurry vision.  Overnight, patient was noted to have worsening withdrawal symptoms and was started on Suboxone.  Today, patient states that she feels well and that her symptoms are much improved.  She was able to tolerate some oral intake this morning  PMHx, Fam Hx, and/or Soc Hx : As per resident admit note  Vitals:   07/05/20 0330 07/05/20 0829  BP: 117/74 127/71  Pulse: 97 (!) 105  Resp: 20 20  Temp: 98.5 F (36.9 C) 98.4 F (36.9 C)  SpO2: 98% 98%   General: Awake, alert, oriented x3, NAD CVS: Regular rhythm, normal heart sounds Lungs: CTA bilaterally Abdomen: Soft, nontender, nondistended, normoactive bowel sounds Extremities: No edema noted, nontender to palpation Psych: Appears anxious HEENT: Normocephalic, atraumatic Skin: Warm and dry  Assessment and Plan: I have seen and evaluated the patient as outlined above. I agree with the formulated Assessment and Plan as detailed in  the residents' note, with the following changes:   1.  Intractable nausea/vomiting in the setting of opiate withdrawal: -Patient presented to the ED with intractable nausea and vomiting in the setting of being unable to obtain her methadone for her opiate use disorder.  Her last opiate use was approximately 48 hours prior to admission.  Patient is also noted to have AKI likely secondary to dehydration as well as an anion gap metabolic acidosis (likely secondary to starvation ketosis) secondary to underlying nausea and vomiting and diarrhea. -Patient was restarted on her home methadone dose yesterday but did have an episode of withdrawal overnight.  At this time she was started on Suboxone for her opiate withdrawal and appears to have responded well to this. -We will continue the patient on Suboxone and have her follow-up in our opiate use disorder clinic -Patient does have better oral intake this morning.  Patient AKI has resolved and her anion gap has closed.  She still is mildly acidotic but I suspect this will continue to improve. -We will DC IV fluids if she is tolerating oral intake -No further work-up at this time. -I suspect patient should be stable for DC home tomorrow if she continues to improve -We will also obtain social work consult to assist patient with medication affordability  Earl Lagos, MD 11/3/202110:59 AM

## 2020-07-05 NOTE — Progress Notes (Signed)
MD received page from nurse stating that patient was having worsening withdrawal symptoms and her CIWA score was increasing each hour.  RN expressed concern of not having Ativan with CIWA protocol.  MD went to the bedside to assess patient.  On arrival to the bedside, patient was shivering and restless. Monitor showed tachycardic but normotensive. On assessment, patient endorsed chills, nausea, sweats and difficulty sleeping. Patient stated that her whole body hurts and she was feeling worse than earlier today. On exam, patient was diaphoretic and tachycardic. RN informed MD that patient's CIWA was 26 and COWS was 15. Patient was given Zofran for nausea.   A/P Patient found to be experiencing worsening withdrawal symptoms at the bedside. Patient received methadone 12 hours ago. In the setting of COWS score of 15 and patient showing worsening signs of withdrawal, will start patient on Suboxone early and monitor for improvement in withdraw symptoms.  --Start Suboxone (2-0.5 mg) 1 tab Sublingual q2h x2 doses --Close monitoring of withdrawal symptoms

## 2020-07-05 NOTE — Hospital Course (Addendum)
#  Opioid Use Disorder #Opioid Withdrawal Patient previously prescribed methadone 130mg  daily, however due to an inability to afford her clinic visits and prescription, she relapsed and began using heroin. Following recreational use, she presented to the ED days later with significant symptoms suggestive of opioid withdrawal including nausea, vomiting, myalgias, diaphoresis, and malaise. Initially received methadone on day 1 and transitioned to low-dose suboxone overnight. The following morning, patient felt much improved and was hemodynamically stable. was transitioned to suboxone 8-2mg  twice daily with improvement. She was discharged with short supply of suboxone 8-2mg  twice daily with plans to follow-up with [facility] opioid clinic shortly after discharge. She understood and agreed with the plan.  #Panic Disorder / Anxiety, chronic Patient with history of panic disorder and anxiety. She was previously prescribed alprazolam 1mg  TID prn at home, however, due to her being off this medication for one week prior to arrival, her alprazolam was held during her hospitalization.  #MDD, chronic Patient's home sertraline initially held, but restarted on hospital day 2 following improvement of withdrawal symptoms.

## 2020-07-05 NOTE — Progress Notes (Addendum)
Paged on-call provider Md Amponsah at 22:40pm to make him aware the patient has CIWA assessments without as needed ativan orders. Md Kirke Corin returned the page at 22:43pm and verbalized he would look at the notes and address accordingly.    Paged Md at 00:40am to inform him that the patient's hourly CIWA score is increasing with each hourly assessment. Awaiting his response or additional orders.

## 2020-07-05 NOTE — Progress Notes (Signed)
Md Kirke Corin came to assess the patient. Last COWS score was 15. Will continue to monitor the patient for safety needs.

## 2020-07-06 ENCOUNTER — Other Ambulatory Visit (HOSPITAL_COMMUNITY): Payer: Self-pay | Admitting: Internal Medicine

## 2020-07-06 LAB — CBC
HCT: 34.5 % — ABNORMAL LOW (ref 36.0–46.0)
Hemoglobin: 11 g/dL — ABNORMAL LOW (ref 12.0–15.0)
MCH: 26.8 pg (ref 26.0–34.0)
MCHC: 31.9 g/dL (ref 30.0–36.0)
MCV: 83.9 fL (ref 80.0–100.0)
Platelets: 223 10*3/uL (ref 150–400)
RBC: 4.11 MIL/uL (ref 3.87–5.11)
RDW: 20 % — ABNORMAL HIGH (ref 11.5–15.5)
WBC: 6.8 10*3/uL (ref 4.0–10.5)
nRBC: 0 % (ref 0.0–0.2)

## 2020-07-06 LAB — MAGNESIUM: Magnesium: 2.6 mg/dL — ABNORMAL HIGH (ref 1.7–2.4)

## 2020-07-06 LAB — COMPREHENSIVE METABOLIC PANEL
ALT: 16 U/L (ref 0–44)
AST: 33 U/L (ref 15–41)
Albumin: 4 g/dL (ref 3.5–5.0)
Alkaline Phosphatase: 58 U/L (ref 38–126)
Anion gap: 15 (ref 5–15)
BUN: 13 mg/dL (ref 6–20)
CO2: 20 mmol/L — ABNORMAL LOW (ref 22–32)
Calcium: 9.6 mg/dL (ref 8.9–10.3)
Chloride: 110 mmol/L (ref 98–111)
Creatinine, Ser: 0.75 mg/dL (ref 0.44–1.00)
GFR, Estimated: >60 mL/min (ref >60)
Glucose, Bld: 80 mg/dL (ref 70–99)
Potassium: 3.6 mmol/L (ref 3.5–5.1)
Sodium: 145 mmol/L (ref 135–145)
Total Bilirubin: 0.6 mg/dL (ref 0.3–1.2)
Total Protein: 7.7 g/dL (ref 6.5–8.1)

## 2020-07-06 LAB — PHOSPHORUS: Phosphorus: 3.3 mg/dL (ref 2.5–4.6)

## 2020-07-06 MED ORDER — BUPRENORPHINE HCL-NALOXONE HCL 8-2 MG SL SUBL
1.0000 | SUBLINGUAL_TABLET | Freq: Two times a day (BID) | SUBLINGUAL | 0 refills | Status: DC
Start: 2020-07-06 — End: 2020-08-29

## 2020-07-06 MED FILL — BUPREN-NALOX SL TAB 8-2MG: 8-2 | 5 days supply | Qty: 10 | Fill #0

## 2020-07-06 NOTE — Progress Notes (Signed)
Found a partially dissolved suboxone tablet in patient's bed while changing patient's linen.

## 2020-07-06 NOTE — Discharge Summary (Signed)
Name: Gabrielle Nguyen MRN: [id number] DOB: 23-Feb-1996 24 y.o. PCP: Duard Brady, MD (Inactive)  Date of Admission: 07/03/2020  6:47 PM  Date of Discharge: 07/06/2020 Attending Physician: Earl Lagos, MD  Discharge Diagnosis: 1. Active Problems:   Intractable nausea and vomiting   Opioid withdrawal (HCC)  Discharge Medications: Allergies as of 07/06/2020   No Known Allergies     Medication List    STOP taking these medications   ALPRAZolam 1 MG tablet Commonly known as: XANAX   ferrous sulfate 325 (65 FE) MG tablet   ibuprofen 600 MG tablet Commonly known as: ADVIL   METHADONE HCL PO   norethindrone 0.35 MG tablet Commonly known as: MICRONOR     TAKE these medications   albuterol 108 (90 Base) MCG/ACT inhaler Commonly known as: VENTOLIN HFA Inhale 2 puffs into the lungs every 6 (six) hours as needed for wheezing or shortness of breath. For shortness of breath   albuterol (2.5 MG/3ML) 0.083% nebulizer solution Commonly known as: PROVENTIL Take 6 mLs (5 mg total) by nebulization every 6 (six) hours as needed for wheezing.   APAP 325 MG tablet Take 2 tablets (650 mg total) by mouth every 6 (six) hours as needed for mild pain or headache.   buprenorphine-naloxone 8-2 mg Subl SL tablet Commonly known as: SUBOXONE Place 1 tablet under the tongue 2 (two) times daily.   omeprazole 20 MG capsule Commonly known as: PRILOSEC Take 20 mg by mouth daily.   prenatal multivitamin Tabs tablet Take 1 tablet by mouth daily at 12 noon.   sertraline 100 MG tablet Commonly known as: ZOLOFT Take 100 mg by mouth daily.       Disposition and follow-up:   [name] was discharged from [facility] in Good condition.  At the hospital follow up visit please address:  1.  Opioid Use Disorder: During hospitalization, patient had her prescribed methadone 130mg  daily discontinued due to financial constraints resulting in relapsing heroin use and  transitioned to suboxone 8-2mg  twice daily with plans to follow up with [facility] opioid clinic. She received a short supply of this medication upon discharge. Please, evaluate patient's adherence to suboxone and recovery with her opioid use disorder.  Major depressive disorder, panic disorder, PTSD: During hospitalization, patient continued on home sertraline, however her home benzodiazepine dose was held as she had not received this medication for over one week prior to arrival. She was receiving these medications through [facility]. Consider psychiatry referral for continued management of underlying psychiatric diagnoses and management.  2.  Labs / imaging needed at time of follow-up: none  3.  Pending labs/ test needing follow-up: none  Follow-up Appointments:  Follow-up Information    [facility] Follow up.   Why: We are establishing you with our [facility]ioid Clinic with [facility]. Contact information: [address] [place] [address]             Hospital Course by problem list: 1. #Opioid Use Disorder #Opioid Withdrawal Patient previously prescribed methadone 130mg  daily, however due to an inability to afford her clinic visits and prescription, she relapsed and began using heroin. Following recreational use, she presented to the ED days later with significant symptoms suggestive of opioid withdrawal including nausea, vomiting, myalgias, diaphoresis, and malaise. Initially received methadone on day 1 and transitioned to low-dose suboxone overnight. The following morning, patient felt much improved and was hemodynamically stable. [id number] was transitioned to  suboxone 8-2mg  twice daily with improvement. She was discharged with short supply of suboxone 8-2mg  twice daily with plans to follow-up with [facility] opioid clinic shortly after discharge. She understood and agreed with the plan.  #Panic Disorder / Anxiety,  chronic Patient with history of panic disorder and anxiety. She was previously prescribed alprazolam 1mg  TID prn at home, however, due to her being off this medication for one week prior to arrival, her alprazolam was held during her hospitalization.  #MDD, chronic Patient's home sertraline initially held, but restarted on hospital day 2 following improvement of withdrawal symptoms.   Discharge Vitals:   BP 124/82 (BP Location: Left Arm)   Pulse 82   Temp 98.3 F (36.8 C) (Oral)   Resp 20   Ht 5\' 7"  (1.702 m)   Wt 55.7 kg   SpO2 99%   BMI 19.23 kg/m   Pertinent Labs, Studies, and Procedures:  CBC Latest Ref Rng & Units 07/06/2020 07/05/2020 07/04/2020  WBC 4.0 - 10.5 K/uL 6.8 9.5 -  Hemoglobin 12.0 - 15.0 g/dL 11.0(L) 10.1(L) 10.5(L)  Hematocrit 36 - 46 % 34.5(L) 31.0(L) 31.0(L)  Platelets 150 - 400 K/uL 223 220 -   CMP Latest Ref Rng & Units 07/06/2020 07/05/2020 07/04/2020  Glucose 70 - 99 mg/dL 80 82 82  BUN 6 - 20 mg/dL 13 9 13/11/2019)  Creatinine 0.44 - 1.00 mg/dL 13/10/2019 16(X 0.96)  Sodium 135 - 145 mmol/L 145 144 141  Potassium 3.5 - 5.1 mmol/L 3.6 3.3(L) 3.2(L)  Chloride 98 - 111 mmol/L 110 111 108  CO2 22 - 32 mmol/L 20(L) 18(L) 12(L)  Calcium 8.9 - 10.3 mg/dL 9.6 9.7 0.45)  Total Protein 6.5 - 8.1 g/dL 7.7 7.1 -  Total Bilirubin 0.3 - 1.2 mg/dL 0.6 0.7 -  Alkaline Phos 38 - 126 U/L 58 59 -  AST 15 - 41 U/L 33 28 -  ALT 0 - 44 U/L 16 12 -   IMAGING: No results found.  Discharge Instructions: Discharge Instructions    Call MD for:  difficulty breathing, headache or visual disturbances   Complete by: As directed    Call MD for:  extreme fatigue   Complete by: As directed    Call MD for:  hives   Complete by: As directed    Call MD for:  persistant dizziness or light-headedness   Complete by: As directed    Call MD for:  persistant nausea and vomiting   Complete by: As directed    Call MD for:  redness, tenderness, or signs of infection (pain, swelling, redness,  odor or green/yellow discharge around incision site)   Complete by: As directed    Call MD for:  severe uncontrolled pain   Complete by: As directed    Call MD for:  temperature >100.4   Complete by: As directed    Diet - low sodium heart healthy   Complete by: As directed    Increase activity slowly   Complete by: As directed      Ms. Boquet,  It was a pleasure meeting you during your recent hospitalization. You were admitted for opioid withdrawal following stopping your methadone and relapsing of IV drug use. We gave you one dose of methadone and then transitioned you to a medication called suboxone which you will need to continue taking twice daily. This medication is taken by placing beneath your tongue. Please, continue taking this medication and follow-[facility] for further prescriptions.  In the meantime, do not take your home methadone. The suboxone will replace this prescription. If you have any questions or concerns, please do not hesitate to call the [facility]. If you have new or worsening symptoms, please present to the emergency department for evaluation.  Sincerely, Dr. Jasmine December, MD  Signed: Roylene Reason, MD 07/06/2020, 1:05 PM   Pager: [phone]

## 2020-07-06 NOTE — Progress Notes (Signed)
Subjective:   Gabrielle Nguyen is a 24 year old female with past medical history of opioid use disorder (on methadone) and anxiety presenting with intractable nausea and vomiting due to opioid withdrawal.  Overnight, patient started on suboxone 8-2mg , however a partially dissolved suboxone tablet was found in her bed upon changing of her linens.  This morning, patient reported unspecified generalized pain. She endorses lightheadedness and dizziness. She reports that she felt nauseous last night and vomited her prescribed buprenorphine. She desires to go home and wishes to follow-up with the [facility] opioid clinic for further management of her opioid use disorder and prescribing of Suboxone.   Objective:  Vital signs in last 24 hours: Vitals:   07/05/20 1300 07/05/20 2252 07/06/20 0000 07/06/20 0558  BP: 122/88  124/83 124/82  Pulse: 86  80 82  Resp: 20 13 (!) 21 20  Temp: 98.4 F (36.9 C) 98 F (36.7 C)  99.1 F (37.3 C)  TempSrc: Oral Axillary  Oral  SpO2: 98%  98% 99%  Weight:    55.7 kg  Height:      SpO2: 99 %  Intake/Output Summary (Last 24 hours) at 07/06/2020 1114 Last data filed at 07/06/2020 0838 Gross per 24 hour  Intake 120 ml  Output 800 ml  Net -680 ml   Filed Weights   07/04/20 2214 07/05/20 0330 07/06/20 0558  Weight: 57.2 kg 56.2 kg 55.7 kg   Physical Exam Vitals and nursing note reviewed. Exam conducted with a chaperone present.  Constitutional:      General: She is not in acute distress.    Appearance: Normal appearance. She is not toxic-appearing.     Comments: Sitting upright in bed with drool hanging from corner of mouth.  HENT:     Head: Normocephalic and atraumatic.  Eyes:     Extraocular Movements: Extraocular movements intact.     Conjunctiva/sclera: Conjunctivae normal.     Pupils: Pupils are equal, round, and reactive to light.  Cardiovascular:     Rate and Rhythm: Normal rate and regular rhythm.     Pulses: Normal pulses.     Heart sounds: Normal  heart sounds.  Pulmonary:     Effort: Pulmonary effort is normal.     Breath sounds: Normal breath sounds.  Abdominal:     General: Abdomen is flat. Bowel sounds are normal.     Palpations: Abdomen is soft.     Tenderness: There is no abdominal tenderness.  Musculoskeletal:        General: Normal range of motion.     Cervical back: Normal range of motion and neck supple.     Right lower leg: No edema.     Left lower leg: No edema.  Skin:    General: Skin is warm and dry.     Capillary Refill: Capillary refill takes less than 2 seconds.  Neurological:     General: No focal deficit present.     Mental Status: Mental status is at baseline.  Psychiatric:     Comments: Flat affect    CBC Latest Ref Rng & Units 07/06/2020 07/05/2020 07/04/2020  WBC 4.0 - 10.5 K/uL 6.8 9.5 -  Hemoglobin 12.0 - 15.0 g/dL 11.0(L) 10.1(L) 10.5(L)  Hematocrit 36 - 46 % 34.5(L) 31.0(L) 31.0(L)  Platelets 150 - 400 K/uL 223 220 -   CMP Latest Ref Rng & Units 07/06/2020 07/05/2020 07/04/2020  Glucose 70 - 99 mg/dL 80 82 82  BUN 6 - 20 mg/dL 13 9 52(D)  Creatinine 0.44 -  1.00 mg/dL 7.10 6.26 9.48(N)  Sodium 135 - 145 mmol/L 145 144 141  Potassium 3.5 - 5.1 mmol/L 3.6 3.3(L) 3.2(L)  Chloride 98 - 111 mmol/L 110 111 108  CO2 22 - 32 mmol/L 20(L) 18(L) 12(L)  Calcium 8.9 - 10.3 mg/dL 9.6 9.7 4.6(E)  Total Protein 6.5 - 8.1 g/dL 7.7 7.1 -  Total Bilirubin 0.3 - 1.2 mg/dL 0.6 0.7 -  Alkaline Phos 38 - 126 U/L 58 59 -  AST 15 - 41 U/L 33 28 -  ALT 0 - 44 U/L 16 12 -   Magnesium - 2.6 Phosphorous - 3.3  IMAGING: No results found.  Assessment/Plan:  Active Problems:   Intractable nausea and vomiting   Opioid withdrawal (HCC)  Gabrielle Nguyen is a 24 year old female with past medical history of opioid use disorder (on methadone) and anxiety who presented with intractable nausea and vomiting in the setting of opioid withdrawal.  #Opioid Use Disorder, chronic #Opioid Withdrawal, improving Patient  previously prescribed methadone 130mg  daily, however due to an inability to afford her clinic visits and prescription, she relapsed and began using heroin. Following recreational use, she presented to the ED days later with significant symptoms suggestive of opioid withdrawal including nausea, vomiting, myalgias, diaphoresis, and malaise. Initially received methadone on day 1 and transitioned to low-dose suboxone overnight. The following morning, patient felt much improved and was hemodynamically stable. was transitioned to suboxone 8-2mg  twice daily with improvement. -Continue suboxone 8-2mg  twice daily -Zofran PRN -Follow-up with [facility] opioid clinic upon discharge  #Electrolyte derangements (hypokalemia, hypophosphatemia), resolved #Starvation ketoacidosis, resolved Patient initially presented with electrolyte derangements including hypokalemia, hyphophosphatemia and anion grap metabolic acidosis. Derangements improved following IVF, electrolyte repletion, and advancement of diet.  #Panic Disorder / Anxiety, chronic On xanax 1mg  TID prn at home, however it has been over one week since last dose. -Holding home benzodiazepines  #MDD, chronic On sertraline 100mg  daily at home. Currently denies suicidal / homicidal ideation. -Continue sertraline 100mg  daily  #VTE ppx: Lovenox #Diet: Clears #IVF: LR at 137mL/hr #Code status: Full code #Bowel regimen: none   , MD 07/06/2020, 11:14 AM Pager: [phone] After 5pm on weekdays and 1pm on weekends: On Call pager [phone]

## 2020-07-06 NOTE — Progress Notes (Signed)
Pt is experiencing a delayed response moving all extremities on command with Drooling set up suction to clear secretions. Was reported that it ongoing.

## 2020-07-06 NOTE — TOC Transition Note (Signed)
Transition of Care [facility]) - CM/SW Discharge Note   Patient Details  Name: Gabrielle Nguyen MRN: [id number] Date of Birth: March 02, 1996  Transition of Care [facility]) CM/SW Contact:  Leone Haven, RN Phone Number: 07/06/2020, 1:13 PM   Clinical Narrative:    NCM spoke with patient, she has Medicaid, she states she has to pay 25.00 at the [facility] clinic, they use her Medicaid and she can afford to pay the 25.00.  NCM gave her SA resources ifnormation  Final next level of care: Home/Self Care Barriers to Discharge: No Barriers Identified   Patient Goals and CMS Choice        Discharge Placement                       Discharge Plan and Services                  DME Agency: NA       HH Arranged: NA          Social Determinants of Health (SDOH) Interventions     Readmission Risk Interventions No flowsheet data found.

## 2020-07-06 NOTE — Discharge Instructions (Signed)
Ms. Jorstad,  It was a pleasure meeting you during your recent hospitalization. You were admitted for opioid withdrawal following stopping your methadone and relapsing of IV drug use. We gave you one dose of methadone and then transitioned you to a medication called suboxone which you will need to continue taking twice daily. This medication is taken by placing beneath your tongue. Please, continue taking this medication and follow-up closely with the [facility] for further prescriptions. In the meantime, do not take your home methadone. The suboxone will replace this prescription. If you have any questions or concerns, please do not hesitate to call the [facility]. If you have new or worsening symptoms, please present to the emergency department for evaluation.  Sincerely, Dr. Jasmine December, MD    Opioid Withdrawal Opioids are powerful substances that relieve pain. Opioids include illegal drugs, such as heroin, as well as prescription pain medicines, such as codeine, morphine, hydrocodone, oxycodone, and fentanyl. Opioid withdrawal is a group of symptoms that can happen if you have been taking opioids for a long time and suddenly stop. What are the causes? This condition is caused by taking opioids for weeks and then doing any of the following:  Stopping use.  Rapidly reducing use.  Taking a medicine to block their effect. What increases the risk? This condition is more likely to develop in:  People who take opioids incorrectly.  People who take opioids for a long period of time. What are the signs or symptoms? Symptoms of this condition can be physical or mental. Physical symptoms include:  Nausea and vomiting.  Muscle aches or spasms.  Watery eyes and runny nose.  Widening of the dark centers of the eyes (dilated pupils).  Hair standing on end.  Fever and sweating.  Intestinal cramping and diarrhea.  Increased  blood pressure and fast pulse. Mental symptoms include:  Depression.  Anxiety.  Restlessness and irritability.  Trouble sleeping. When symptoms start and how long they last depends on if you have been taking an opioid that works fast and then loses its effect quickly (short acting-opioid), an opioid that works for a longer period of time (long-acting opioid), or a drug that blocks the effects of opioids.  If you have been taking a short-acting opioid, such as heroin and oxycodone, symptoms occur within hours of stopping or reducing the amount you take. The worst symptoms (peak withdrawal) occur in 24-48 hours. Symptoms should subside in 3-5 days.  If you have been taking a long-acting opioid, such as methadone, symptoms can occur within 30 hours of stopping or reducing the amount you take and can continue for up to 10 days.  If you are taking a drug that blocks the effects of opioids, such as naltrexone or naloxone, symptoms begin within minutes. How is this diagnosed? This condition is diagnosed based on:  Your symptoms.  Your medical history.  Your history of drug and alcohol use.  Which medicines you have been taking. Your health care provider may:  Perform a physical exam.  Order tests.  Ask that you see a mental health professional. How is this treated? Treatment for this condition is usually provided by mental health professionals with training in substance use disorders (addiction specialists). Treatment may involve:  Counseling. This treatment is also called talk therapy. It is provided by substance use treatment counselors.  Support groups. Support groups are run by people who have quit using opioids. They provide emotional support, advice, and  guidance.  Medicine. Some medicines can help to lessen certain withdrawal symptoms. Sometimes an opioid is prescribed to replace the opioid that you have been taking. You may be asked to take less and less of this opioid over  time to lessen or prevent withdrawal symptoms. Follow these instructions at home:  Take over-the-counter and prescription medicines only as told by your health care provider.  Check with your health care provider before starting any new medicines.  Keep all follow-up visits as told by your health care provider. This is important. Contact a health care provider if:  You are not able to take your medicines as told.  Your symptoms get worse.  You take an opioid after stopping use, or you take more of an opioid than you have been. Get help right away if:  You have a seizure.  You lose consciousness.  You have serious thoughts about hurting yourself or others. If you ever feel like you may hurt yourself or others, or have thoughts about taking your own life, get help right away. You can go to your nearest emergency department or call:  Your local emergency services (911 in the [place]  A suicide crisis helpline, such as the National Suicide Prevention Lifeline at 431-579-7966. This is open 24 hours a day. This information is not intended to replace advice given to you by your health care provider. Make sure you discuss any questions you have with your health care provider. Document Revised: 08/01/2017 Document Reviewed: 05/31/2016 Elsevier Patient Education  2020 [employer].

## 2020-07-06 NOTE — TOC Progression Note (Signed)
Transition of Care [facility]) - Progression Note    Patient Details  Name: Gabrielle Nguyen MRN: [id number] Date of Birth: [birth year]  [facility] CM/SW Contact  Leone Haven, RN Phone Number: 07/06/2020, 9:37 AM  Clinical Narrative:    NCM spoke with patient, she has Medicaid, she states she has to pay 25.00 at the [facility] clinic, they use her Medicaid and she can afford to pay the 25.00.  NCM gave her SA resources ifnormation.     Barriers to Discharge: No Barriers Identified  Expected Discharge Plan and Services                             DME Agency: NA       HH Arranged: NA           Social Determinants of Health (SDOH) Interventions    Readmission Risk Interventions No flowsheet data found.

## 2020-07-06 NOTE — Plan of Care (Signed)

## 2020-07-06 NOTE — Plan of Care (Signed)
°  Problem: Clinical Measurements: °Goal: Respiratory complications will improve °Outcome: Progressing °Goal: Cardiovascular complication will be avoided °Outcome: Progressing °  °Problem: Activity: °Goal: Risk for activity intolerance will decrease °Outcome: Progressing °  °

## 2020-07-06 NOTE — Progress Notes (Signed)
D/C instructions given and reviewed. No questions asked. Tele and IV removed, tolerated well.

## 2020-07-09 LAB — CULTURE, BLOOD (ROUTINE X 2)
Culture: NO GROWTH
Culture: NO GROWTH
Special Requests: ADEQUATE

## 2020-07-11 ENCOUNTER — Ambulatory Visit: Payer: Medicaid Other

## 2020-08-26 ENCOUNTER — Emergency Department (HOSPITAL_COMMUNITY): Payer: Medicaid Other

## 2020-08-26 ENCOUNTER — Encounter (HOSPITAL_COMMUNITY): Payer: Self-pay | Admitting: Student

## 2020-08-26 ENCOUNTER — Inpatient Hospital Stay (HOSPITAL_COMMUNITY)
Admission: EM | Admit: 2020-08-26 | Discharge: 2020-08-29 | DRG: 897 | Disposition: A | Discharge status: Home / Self Care | Payer: Medicaid Other | Attending: Internal Medicine | Admitting: Internal Medicine

## 2020-08-26 ENCOUNTER — Other Ambulatory Visit: Payer: Self-pay

## 2020-08-26 DIAGNOSIS — Z813 Family history of other psychoactive substance abuse and dependence: Secondary | ICD-10-CM

## 2020-08-26 DIAGNOSIS — E876 Hypokalemia: Secondary | ICD-10-CM | POA: Diagnosis present

## 2020-08-26 DIAGNOSIS — F1123 Opioid dependence with withdrawal: Principal | ICD-10-CM

## 2020-08-26 DIAGNOSIS — Z87891 Personal history of nicotine dependence: Secondary | ICD-10-CM

## 2020-08-26 DIAGNOSIS — Z818 Family history of other mental and behavioral disorders: Secondary | ICD-10-CM

## 2020-08-26 DIAGNOSIS — E870 Hyperosmolality and hypernatremia: Secondary | ICD-10-CM | POA: Diagnosis present

## 2020-08-26 DIAGNOSIS — R1111 Vomiting without nausea: Secondary | ICD-10-CM

## 2020-08-26 DIAGNOSIS — F1193 Opioid use, unspecified with withdrawal: Secondary | ICD-10-CM

## 2020-08-26 DIAGNOSIS — Z20822 Contact with and (suspected) exposure to covid-19: Secondary | ICD-10-CM | POA: Diagnosis present

## 2020-08-26 DIAGNOSIS — Z811 Family history of alcohol abuse and dependence: Secondary | ICD-10-CM

## 2020-08-26 DIAGNOSIS — R112 Nausea with vomiting, unspecified: Secondary | ICD-10-CM | POA: Diagnosis present

## 2020-08-26 DIAGNOSIS — R748 Abnormal levels of other serum enzymes: Secondary | ICD-10-CM

## 2020-08-26 DIAGNOSIS — E860 Dehydration: Secondary | ICD-10-CM

## 2020-08-26 DIAGNOSIS — F419 Anxiety disorder, unspecified: Secondary | ICD-10-CM | POA: Diagnosis present

## 2020-08-26 HISTORY — DX: Opioid use, unspecified with withdrawal: F11.93

## 2020-08-26 HISTORY — DX: Opioid dependence with withdrawal: F11.23

## 2020-08-26 LAB — BASIC METABOLIC PANEL
Anion gap: 15 (ref 5–15)
BUN: 38 mg/dL — ABNORMAL HIGH (ref 6–20)
CO2: 25 mmol/L (ref 22–32)
Calcium: 9.3 mg/dL (ref 8.9–10.3)
Chloride: 105 mmol/L (ref 98–111)
Creatinine, Ser: 0.92 mg/dL (ref 0.44–1.00)
GFR, Estimated: >60 mL/min (ref >60)
Glucose, Bld: 99 mg/dL (ref 70–99)
Potassium: 3.4 mmol/L — ABNORMAL LOW (ref 3.5–5.1)
Sodium: 145 mmol/L (ref 135–145)

## 2020-08-26 LAB — MAGNESIUM: Magnesium: 2.7 mg/dL — ABNORMAL HIGH (ref 1.7–2.4)

## 2020-08-26 LAB — RESP PANEL BY RT-PCR (FLU A&B, COVID) ARPGX2
Influenza A by PCR: NEGATIVE
Influenza B by PCR: NEGATIVE
SARS Coronavirus 2 by RT PCR: NEGATIVE

## 2020-08-26 LAB — CBC WITH DIFFERENTIAL/PLATELET
Abs Immature Granulocytes: 0.04 10*3/uL (ref 0.00–0.07)
Basophils Absolute: 0 10*3/uL (ref 0.0–0.1)
Basophils Relative: 0 %
Eosinophils Absolute: 0 10*3/uL (ref 0.0–0.5)
Eosinophils Relative: 0 %
HCT: 37.3 % (ref 36.0–46.0)
Hemoglobin: 11.9 g/dL — ABNORMAL LOW (ref 12.0–15.0)
Immature Granulocytes: 0 %
Lymphocytes Relative: 11 %
Lymphs Abs: 1.1 10*3/uL (ref 0.7–4.0)
MCH: 27.7 pg (ref 26.0–34.0)
MCHC: 31.9 g/dL (ref 30.0–36.0)
MCV: 86.7 fL (ref 80.0–100.0)
Monocytes Absolute: 0.4 10*3/uL (ref 0.1–1.0)
Monocytes Relative: 4 %
Neutro Abs: 8.2 10*3/uL — ABNORMAL HIGH (ref 1.7–7.7)
Neutrophils Relative %: 85 %
Platelets: 326 10*3/uL (ref 150–400)
RBC: 4.3 MIL/uL (ref 3.87–5.11)
RDW: 16.2 % — ABNORMAL HIGH (ref 11.5–15.5)
WBC: 9.8 10*3/uL (ref 4.0–10.5)
nRBC: 0 % (ref 0.0–0.2)

## 2020-08-26 LAB — I-STAT BETA HCG BLOOD, ED (MC, WL, AP ONLY): I-stat hCG, quantitative: 8.1 m[IU]/mL — ABNORMAL HIGH (ref <5)

## 2020-08-26 LAB — HEPATIC FUNCTION PANEL
ALT: 18 U/L (ref 0–44)
AST: 35 U/L (ref 15–41)
Albumin: 4.6 g/dL (ref 3.5–5.0)
Alkaline Phosphatase: 72 U/L (ref 38–126)
Bilirubin, Direct: 0.2 mg/dL (ref 0.0–0.2)
Indirect Bilirubin: 0.5 mg/dL (ref 0.3–0.9)
Total Bilirubin: 0.7 mg/dL (ref 0.3–1.2)
Total Protein: 8.5 g/dL — ABNORMAL HIGH (ref 6.5–8.1)

## 2020-08-26 LAB — LIPASE, BLOOD: Lipase: 21 U/L (ref 11–51)

## 2020-08-26 MED ORDER — DEXTROSE-NACL 5-0.45 % IV SOLN: INTRAVENOUS | Status: DC

## 2020-08-26 MED ORDER — METHADONE HCL 10 MG/ML IJ SOLN: 10.0000 mg | Freq: Once | INTRAMUSCULAR | Status: DC

## 2020-08-26 MED ORDER — ONDANSETRON HCL 4 MG/2ML IJ SOLN
INTRAMUSCULAR | Status: AC
  Administered 2020-08-26: 4 mg via INTRAVENOUS
  Filled 2020-08-26: qty 2

## 2020-08-26 MED ORDER — POTASSIUM CHLORIDE 10 MEQ/100ML IV SOLN
10.0000 meq | INTRAVENOUS | Status: AC
  Administered 2020-08-26 (×4): 10 meq via INTRAVENOUS
  Filled 2020-08-26 (×3): qty 100

## 2020-08-26 MED ORDER — ONDANSETRON HCL 4 MG/2ML IJ SOLN: 4.0000 mg | Freq: Once | INTRAMUSCULAR | Status: AC

## 2020-08-26 MED ORDER — METHADONE HCL 5 MG/5ML PO SOLN: 130.0000 mg | Freq: Once | ORAL | Status: DC

## 2020-08-26 MED ORDER — PROCHLORPERAZINE EDISYLATE 10 MG/2ML IJ SOLN
10.0000 mg | Freq: Four times a day (QID) | INTRAMUSCULAR | Status: DC | PRN
  Administered 2020-08-26: 10 mg via INTRAVENOUS
  Filled 2020-08-26: qty 2

## 2020-08-26 MED ORDER — SODIUM CHLORIDE 0.9 % IV BOLUS
1000.0000 mL | Freq: Once | INTRAVENOUS | Status: AC
  Administered 2020-08-26: 1000 mL via INTRAVENOUS

## 2020-08-26 MED ORDER — LORAZEPAM 2 MG/ML IJ SOLN
1.0000 mg | Freq: Four times a day (QID) | INTRAMUSCULAR | Status: DC | PRN
  Administered 2020-08-26 – 2020-08-27 (×3): 1 mg via INTRAVENOUS
  Filled 2020-08-26 (×4): qty 1

## 2020-08-26 MED ORDER — THIAMINE HCL 100 MG/ML IJ SOLN
Freq: Once | INTRAVENOUS | Status: AC
  Filled 2020-08-26: qty 1000

## 2020-08-26 MED ORDER — BUPRENORPHINE HCL-NALOXONE HCL 8-2 MG SL SUBL
1.0000 | SUBLINGUAL_TABLET | Freq: Every day | SUBLINGUAL | Status: DC
Start: 2020-08-26 — End: 2020-08-26
  Administered 2020-08-26: 1 via SUBLINGUAL
  Filled 2020-08-26: qty 1

## 2020-08-26 MED ORDER — METHADONE HCL 5 MG/5ML PO SOLN: 80.0000 mg | Freq: Once | ORAL | Status: DC

## 2020-08-26 MED ORDER — METHADONE HCL 40 MG PO TBSO: 120.0000 mg | ORAL_TABLET | Freq: Once | ORAL | Status: DC

## 2020-08-26 MED ORDER — METHADONE HCL 10 MG/ML PO CONC
130.0000 mg | Freq: Once | ORAL | Status: DC
Start: 2020-08-26 — End: 2020-08-26

## 2020-08-26 MED ORDER — SODIUM CHLORIDE 0.9 % IV SOLN: Freq: Once | INTRAVENOUS | Status: DC

## 2020-08-26 MED ORDER — ONDANSETRON HCL 4 MG/2ML IJ SOLN
4.0000 mg | Freq: Once | INTRAMUSCULAR | Status: AC
  Administered 2020-08-26: 4 mg via INTRAVENOUS
  Filled 2020-08-26: qty 2

## 2020-08-26 MED ORDER — MORPHINE SULFATE (PF) 2 MG/ML IV SOLN
2.0000 mg | INTRAVENOUS | Status: DC | PRN
  Administered 2020-08-26 – 2020-08-28 (×6): 2 mg via INTRAVENOUS
  Filled 2020-08-26 (×6): qty 1

## 2020-08-26 MED ORDER — METHADONE HCL 5 MG/5ML PO SOLN
130.0000 mg | Freq: Once | ORAL | Status: DC
Start: 2020-08-26 — End: 2020-08-26
  Filled 2020-08-26: qty 500
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
## 2020-08-26 NOTE — ED Notes (Signed)
ED Provider at bedside. 

## 2020-08-26 NOTE — ED Triage Notes (Addendum)
Patient BIB [facility] c/o dehydration and withdrawals from methdone. Patient had been vomiting and not drinking any fluids. EMS placed 22g left wrist and received 400 mls of saline on scene. Last used methadone 2 days ago, EMS was called 2 days ago but patient refused to come to hospital.  Patient did not want to come today.    Vitals were 130/86 110-HR 100% room air 120-CBG 101.1-temp 12 lead unremarkable
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
## 2020-08-27 ENCOUNTER — Observation Stay (HOSPITAL_COMMUNITY): Payer: Medicaid Other

## 2020-08-27 DIAGNOSIS — R748 Abnormal levels of other serum enzymes: Secondary | ICD-10-CM | POA: Diagnosis not present

## 2020-08-27 DIAGNOSIS — E876 Hypokalemia: Secondary | ICD-10-CM | POA: Diagnosis present

## 2020-08-27 DIAGNOSIS — R112 Nausea with vomiting, unspecified: Secondary | ICD-10-CM | POA: Diagnosis present

## 2020-08-27 DIAGNOSIS — F419 Anxiety disorder, unspecified: Secondary | ICD-10-CM | POA: Diagnosis present

## 2020-08-27 DIAGNOSIS — R1111 Vomiting without nausea: Secondary | ICD-10-CM | POA: Diagnosis not present

## 2020-08-27 DIAGNOSIS — Z818 Family history of other mental and behavioral disorders: Secondary | ICD-10-CM | POA: Diagnosis not present

## 2020-08-27 DIAGNOSIS — Z20822 Contact with and (suspected) exposure to covid-19: Secondary | ICD-10-CM | POA: Diagnosis present

## 2020-08-27 DIAGNOSIS — Z811 Family history of alcohol abuse and dependence: Secondary | ICD-10-CM | POA: Diagnosis not present

## 2020-08-27 DIAGNOSIS — F1123 Opioid dependence with withdrawal: Secondary | ICD-10-CM | POA: Diagnosis present

## 2020-08-27 DIAGNOSIS — Z813 Family history of other psychoactive substance abuse and dependence: Secondary | ICD-10-CM | POA: Diagnosis not present

## 2020-08-27 DIAGNOSIS — E860 Dehydration: Secondary | ICD-10-CM | POA: Diagnosis present

## 2020-08-27 DIAGNOSIS — Z87891 Personal history of nicotine dependence: Secondary | ICD-10-CM | POA: Diagnosis not present

## 2020-08-27 DIAGNOSIS — E870 Hyperosmolality and hypernatremia: Secondary | ICD-10-CM | POA: Diagnosis present

## 2020-08-27 LAB — COMPREHENSIVE METABOLIC PANEL
ALT: 72 U/L — ABNORMAL HIGH (ref 0–44)
AST: 164 U/L — ABNORMAL HIGH (ref 15–41)
Albumin: 4.1 g/dL (ref 3.5–5.0)
Alkaline Phosphatase: 67 U/L (ref 38–126)
Anion gap: 13 (ref 5–15)
BUN: 19 mg/dL (ref 6–20)
CO2: 23 mmol/L (ref 22–32)
Calcium: 9.2 mg/dL (ref 8.9–10.3)
Chloride: 111 mmol/L (ref 98–111)
Creatinine, Ser: 0.73 mg/dL (ref 0.44–1.00)
GFR, Estimated: >60 mL/min (ref >60)
Glucose, Bld: 96 mg/dL (ref 70–99)
Potassium: 3.7 mmol/L (ref 3.5–5.1)
Sodium: 147 mmol/L — ABNORMAL HIGH (ref 135–145)
Total Bilirubin: 0.7 mg/dL (ref 0.3–1.2)
Total Protein: 7.5 g/dL (ref 6.5–8.1)

## 2020-08-27 LAB — RAPID URINE DRUG SCREEN, HOSP PERFORMED
Amphetamines: NOT DETECTED
Barbiturates: NOT DETECTED
Benzodiazepines: POSITIVE — AB
Cocaine: NOT DETECTED
Opiates: POSITIVE — AB
Tetrahydrocannabinol: NOT DETECTED

## 2020-08-27 LAB — URINALYSIS, ROUTINE W REFLEX MICROSCOPIC
Bacteria, UA: NONE SEEN
Bilirubin Urine: NEGATIVE
Glucose, UA: NEGATIVE mg/dL
Hgb urine dipstick: NEGATIVE
Ketones, ur: 20 mg/dL — AB
Nitrite: NEGATIVE
Protein, ur: NEGATIVE mg/dL
Specific Gravity, Urine: 1.021 (ref 1.005–1.030)
pH: 6 (ref 5.0–8.0)

## 2020-08-27 LAB — CBC
HCT: 35.5 % — ABNORMAL LOW (ref 36.0–46.0)
Hemoglobin: 11 g/dL — ABNORMAL LOW (ref 12.0–15.0)
MCH: 27.2 pg (ref 26.0–34.0)
MCHC: 31 g/dL (ref 30.0–36.0)
MCV: 87.7 fL (ref 80.0–100.0)
Platelets: 310 10*3/uL (ref 150–400)
RBC: 4.05 MIL/uL (ref 3.87–5.11)
RDW: 16.7 % — ABNORMAL HIGH (ref 11.5–15.5)
WBC: 9.2 10*3/uL (ref 4.0–10.5)
nRBC: 0 % (ref 0.0–0.2)

## 2020-08-27 LAB — PREGNANCY, URINE: Preg Test, Ur: NEGATIVE

## 2020-08-27 MED ORDER — CLONIDINE HCL 0.1 MG PO TABS
0.1000 mg | ORAL_TABLET | Freq: Four times a day (QID) | ORAL | Status: DC
  Administered 2020-08-29: 0.1 mg via ORAL
  Filled 2020-08-27 (×3): qty 1

## 2020-08-27 MED ORDER — METHOCARBAMOL 500 MG PO TABS
500.0000 mg | ORAL_TABLET | Freq: Three times a day (TID) | ORAL | Status: DC | PRN
  Filled 2020-08-27: qty 1

## 2020-08-27 MED ORDER — ONDANSETRON 4 MG PO TBDP: 4.0000 mg | ORAL_TABLET | Freq: Four times a day (QID) | ORAL | Status: DC | PRN

## 2020-08-27 MED ORDER — LOPERAMIDE HCL 2 MG PO CAPS: 2.0000 mg | ORAL_CAPSULE | ORAL | Status: DC | PRN

## 2020-08-27 MED ORDER — CLONIDINE HCL 0.1 MG PO TABS: 0.1000 mg | ORAL_TABLET | ORAL | Status: DC

## 2020-08-27 MED ORDER — HYDROXYZINE HCL 25 MG PO TABS
25.0000 mg | ORAL_TABLET | Freq: Four times a day (QID) | ORAL | Status: DC | PRN
  Administered 2020-08-27: 25 mg via ORAL
  Filled 2020-08-27: qty 1

## 2020-08-27 MED ORDER — CLONIDINE HCL 0.1 MG PO TABS: 0.1000 mg | ORAL_TABLET | Freq: Every day | ORAL | Status: DC

## 2020-08-27 MED ORDER — NAPROXEN 500 MG PO TABS
500.0000 mg | ORAL_TABLET | Freq: Two times a day (BID) | ORAL | Status: DC | PRN
  Administered 2020-08-29: 500 mg via ORAL
  Filled 2020-08-27: qty 1

## 2020-08-27 MED ORDER — LORAZEPAM 2 MG/ML IJ SOLN
1.0000 mg | INTRAMUSCULAR | Status: DC | PRN
  Administered 2020-08-27 – 2020-08-28 (×2): 1 mg via INTRAVENOUS
  Filled 2020-08-27 (×2): qty 1

## 2020-08-27 MED ORDER — DICYCLOMINE HCL 20 MG PO TABS
20.0000 mg | ORAL_TABLET | Freq: Four times a day (QID) | ORAL | Status: DC | PRN
Start: 2020-08-27 — End: 2020-08-29
  Filled 2020-08-27: qty 1

## 2020-08-27 NOTE — Progress Notes (Signed)
PROGRESS NOTE    Gabrielle Nguyen  VOZ:[id number] DOB: 03/25/1996 DOA: 08/26/2020 PCP: Duard Brady, MD (Inactive)   Chief Complaint  Patient presents with  . Emesis  . Dehydration    Brief Narrative:   Gabrielle Nguyen is a 24 y.o. female with medical history significant of opioid addiction, anxiety. Presenting with intractable nausea and vomiting. As per the patient she has been out of methadone for several days and comes in to ED for nausea and vomiting.   Assessment & Plan:   Active Problems:   Methadone withdrawal (HCC)   Methadone withdrawal symptoms of nausea and vomiting: Improving, but not resolved.  Continue with symptomatic management with Anti emetics and IV fliuds.  IV morphine for pain.  Opioid withdrawal protocol in place.     Mild hypernatremia:  - started her on IV dextrose.    Anxiety:  On IV ativan 1 mg every 6 hours prn.    Elevated liver enzymes:  Pt denies any abdominal pain, .  US abdomen ordered for further evaluation.    Hypokalemia:  Replaced.    DVT prophylaxis: SCDs Code Status: Full code Family Communication: None at bedside Disposition:   Status is: Observation  The patient will require care spanning > 2 midnights and should be moved to inpatient because: Unsafe d/c plan, IV treatments appropriate due to intensity of illness or inability to take PO and Inpatient level of care appropriate due to severity of illness has persistent nausea and vomiting  Dispo: The patient is from: Home              Anticipated d/c is to: Home              Anticipated d/c date is: 2 days              Patient currently is not medically stable to d/c.       Consultants:   None  Procedures: None  Antimicrobials: None   Subjective: Patient reports being nauseated  Objective: Vitals:   08/26/20 2022 08/26/20 2157 08/27/20 0148 08/27/20 0635  BP: (!) 150/73 129/79 139/82 (!) 143/81  Pulse: 81 (!) 117 97 (!) 107  Resp: 20 20 18 20    Temp: 98.4 F (36.9 C) 97.7 F (36.5 C) 98.4 F (36.9 C) 98.3 F (36.8 C)  TempSrc: Oral  Oral Oral  SpO2: 96% 99% 98% 98%  Weight:      Height:        Intake/Output Summary (Last 24 hours) at 08/27/2020 1018 Last data filed at 08/27/2020 0406 Gross per 24 hour  Intake 1052.55 ml  Output --  Net 1052.55 ml   Filed Weights   08/26/20 0956  Weight: 56 kg    Examination:  General exam: Ill-appearing lady not in any kind of distress Respiratory system: Clear to auscultation. Respiratory effort normal. Cardiovascular system: S1 & S2 heard, tachycardic. No JVD, No pedal edema. Gastrointestinal system: Abdomen is nondistended, soft and nontender.Normal bowel sounds heard. Central nervous system: Alert, appears to be confused Extremities: Symmetric 5 x 5 power. Skin: No rashes seen Psychiatry: Cannot be assessed    Data Reviewed: I have personally reviewed following labs and imaging studies  CBC: Recent Labs  Lab 08/26/20 1012 08/27/20 0517  WBC 9.8 9.2  NEUTROABS 8.2*  --   HGB 11.9* 11.0*  HCT 37.3 35.5*  MCV 86.7 87.7  PLT 326 310    Basic Metabolic Panel: Recent Labs  Lab 08/26/20 1012 08/26/20 1357 08/27/20 0517  NA 145  --  147*  K 3.4*  --  3.7  CL 105  --  111  CO2 25  --  23  GLUCOSE 99  --  96  BUN 38*  --  19  CREATININE 0.92  --  0.73  CALCIUM 9.3  --  9.2  MG  --  2.7*  --     GFR: Estimated Creatinine Clearance: 94.2 mL/min (by C-G formula based on SCr of 0.73 mg/dL).  Liver Function Tests: Recent Labs  Lab 08/26/20 1012 08/27/20 0517  AST 35 164*  ALT 18 72*  ALKPHOS 72 67  BILITOT 0.7 0.7  PROT 8.5* 7.5  ALBUMIN 4.6 4.1    CBG: No results for input(s): GLUCAP in the last 168 hours.   Recent Results (from the past 240 hour(s))  Resp Panel by RT-PCR (Flu A&B, Covid) Nasopharyngeal Swab     Status: None   Collection Time: 08/26/20 10:26 AM   Specimen: Nasopharyngeal Swab; Nasopharyngeal(NP) swabs in vial transport  medium  Result Value Ref Range Status   SARS Coronavirus 2 by RT PCR NEGATIVE NEGATIVE Final    Comment: (NOTE) SARS-CoV-2 target nucleic acids are NOT DETECTED.  The SARS-CoV-2 RNA is generally detectable in upper respiratory specimens during the acute phase of infection. The lowest concentration of SARS-CoV-2 viral copies this assay can detect is 138 copies/mL. A negative result does not preclude SARS-Cov-2 infection and should not be used as the sole basis for treatment or other patient management decisions. A negative result may occur with  improper specimen collection/handling, submission of specimen other than nasopharyngeal swab, presence of viral mutation(s) within the areas targeted by this assay, and inadequate number of viral copies(<138 copies/mL). A negative result must be combined with clinical observations, patient history, and epidemiological information. The expected result is Negative.  Fact Sheet for Patients:  BloggerCourse.com  Fact Sheet for Healthcare Providers:  SeriousBroker.it  This test is no t yet approved or cleared by the [place] FDA and  has been authorized for detection and/or diagnosis of SARS-CoV-2 by FDA under an Emergency Use Authorization (EUA). This EUA will remain  in effect (meaning this test can be used) for the duration of the COVID-19 declaration under Section 564(b)(1) of the Act, 21 U.S.C.section 360bbb-3(b)(1), unless the authorization is terminated  or revoked sooner.       Influenza A by PCR NEGATIVE NEGATIVE Final   Influenza B by PCR NEGATIVE NEGATIVE Final    Comment: (NOTE) The Xpert Xpress SARS-CoV-2/FLU/RSV plus assay is intended as an aid in the diagnosis of influenza from Nasopharyngeal swab specimens and should not be used as a sole basis for treatment. Nasal washings and aspirates are unacceptable for Xpert Xpress SARS-CoV-2/FLU/RSV testing.  Fact Sheet for  Patients: BloggerCourse.com  Fact Sheet for Healthcare Providers: SeriousBroker.it  This test is not yet approved or cleared by the [place] FDA and has been authorized for detection and/or diagnosis of SARS-CoV-2 by FDA under an Emergency Use Authorization (EUA). This EUA will remain in effect (meaning this test can be used) for the duration of the COVID-19 declaration under Section 564(b)(1) of the Act, 21 U.S.C. section 360bbb-3(b)(1), unless the authorization is terminated or revoked.  Performed at [facility], [address], [place], [place] [address]          Radiology Studies: DG Chest Portable 1 View  Result Date: 08/26/2020 CLINICAL DATA:  Withdrawal symptoms with weakness EXAM: PORTABLE CHEST 1 VIEW COMPARISON:  09/30/2014 FINDINGS: The heart size and mediastinal contours are within normal limits. Both lungs are clear. The visualized skeletal structures are unremarkable. IMPRESSION: No active disease. Electronically Signed   By: Alcide Clever M.D.   On: 08/26/2020 10:33        Scheduled Meds: Continuous Infusions: . dextrose 5 % and 0.45% NaCl 75 mL/hr at 08/27/20 0324     LOS: 0 days        Kathlen Mody, MD [facility]   To contact the attending provider between 7A-7P or the covering provider during after hours 7P-7A, please log into the web site [email or web address] and access using universal [place] password for that web site. If you do not have the password, please call the hospital operator.  08/27/2020, 10:18 AM

## 2020-08-27 NOTE — Plan of Care (Signed)
  Problem: Health Behavior/Discharge Planning: Goal: Ability to manage health-related needs will improve Outcome: Progressing   Problem: Clinical Measurements: Goal: Ability to maintain clinical measurements within normal limits will improve Outcome: Progressing Goal: Diagnostic test results will improve Outcome: Progressing   Problem: Activity: Goal: Risk for activity intolerance will decrease Outcome: Progressing   Problem: Coping: Goal: Level of anxiety will decrease Outcome: Progressing   Problem: Pain Managment: Goal: General experience of comfort will improve Outcome: Progressing   Problem: Safety: Goal: Ability to remain free from injury will improve Outcome: Progressing   Problem: Education: Goal: Knowledge of disease or condition will improve Outcome: Progressing Goal: Understanding of discharge needs will improve Outcome: Progressing   Problem: Health Behavior/Discharge Planning: Goal: Ability to identify changes in lifestyle to reduce recurrence of condition will improve Outcome: Progressing Goal: Identification of resources available to assist in meeting health care needs will improve Outcome: Progressing   Problem: Physical Regulation: Goal: Complications related to the disease process, condition or treatment will be avoided or minimized Outcome: Progressing   Problem: Safety: Goal: Ability to remain free from injury will improve Outcome: Progressing

## 2020-08-27 NOTE — TOC Transition Note (Signed)
Transition of Care [facility]) - CM/SW Discharge Note   Patient Details  Name: Gabrielle Nguyen MRN: [id number] Date of Birth: 05/13/1996  [facility] CM/SW Contact:  Coralyn Helling, LCSW Phone Number: 08/27/2020, 9:59 AM   Clinical Narrative:     [facility] consulted for SA resources. Patient in obs. SA resources added to patient chart.         Patient Goals and CMS Choice        Discharge Placement                       Discharge Plan and Services                                     Social Determinants of Health (SDOH) Interventions     Readmission Risk Interventions No flowsheet data found.

## 2020-08-27 NOTE — Plan of Care (Signed)
  Problem: Health Behavior/Discharge Planning: Goal: Ability to manage health-related needs will improve Outcome: Progressing   Problem: Clinical Measurements: Goal: Ability to maintain clinical measurements within normal limits will improve Outcome: Progressing Goal: Diagnostic test results will improve Outcome: Progressing   Problem: Activity: Goal: Risk for activity intolerance will decrease Outcome: Progressing   Problem: Coping: Goal: Level of anxiety will decrease Outcome: Progressing   Problem: Pain Managment: Goal: General experience of comfort will improve Outcome: Progressing   Problem: Safety: Goal: Ability to remain free from injury will improve Outcome: Progressing   Problem: Education: Goal: Knowledge of disease or condition will improve Outcome: Progressing Goal: Understanding of discharge needs will improve Outcome: Progressing   Problem: Health Behavior/Discharge Planning: Goal: Ability to identify changes in lifestyle to reduce recurrence of condition will improve Outcome: Progressing Goal: Identification of resources available to assist in meeting health care needs will improve Outcome: Progressing   Problem: Physical Regulation: Goal: Complications related to the disease process, condition or treatment will be avoided or minimized Outcome: Progressing   Problem: Safety: Goal: Ability to remain free from injury will improve Outcome: Progressing   

## 2020-08-28 DIAGNOSIS — R112 Nausea with vomiting, unspecified: Secondary | ICD-10-CM

## 2020-08-28 LAB — COMPREHENSIVE METABOLIC PANEL
ALT: 42 U/L (ref 0–44)
AST: 48 U/L — ABNORMAL HIGH (ref 15–41)
Albumin: 3.9 g/dL (ref 3.5–5.0)
Alkaline Phosphatase: 59 U/L (ref 38–126)
Anion gap: 12 (ref 5–15)
BUN: 10 mg/dL (ref 6–20)
CO2: 24 mmol/L (ref 22–32)
Calcium: 9 mg/dL (ref 8.9–10.3)
Chloride: 107 mmol/L (ref 98–111)
Creatinine, Ser: 0.71 mg/dL (ref 0.44–1.00)
GFR, Estimated: >60 mL/min (ref >60)
Glucose, Bld: 101 mg/dL — ABNORMAL HIGH (ref 70–99)
Potassium: 3.3 mmol/L — ABNORMAL LOW (ref 3.5–5.1)
Sodium: 143 mmol/L (ref 135–145)
Total Bilirubin: 0.3 mg/dL (ref 0.3–1.2)
Total Protein: 7.1 g/dL (ref 6.5–8.1)

## 2020-08-28 MED ORDER — POTASSIUM CHLORIDE 10 MEQ/100ML IV SOLN
10.0000 meq | INTRAVENOUS | Status: AC
Start: 2020-08-28 — End: 2020-08-28
  Administered 2020-08-28 (×2): 10 meq via INTRAVENOUS
  Filled 2020-08-28: qty 100

## 2020-08-28 MED ORDER — MORPHINE SULFATE (PF) 2 MG/ML IV SOLN
1.0000 mg | INTRAVENOUS | Status: DC | PRN
  Administered 2020-08-28 – 2020-08-29 (×5): 2 mg via INTRAVENOUS
  Filled 2020-08-28 (×4): qty 1

## 2020-08-28 MED ORDER — METHADONE HCL 10 MG/ML PO CONC
130.0000 mg | Freq: Every day | ORAL | Status: DC
  Administered 2020-08-28 – 2020-08-29 (×2): 130 mg via ORAL
  Filled 2020-08-28 (×2): qty 13

## 2020-08-28 MED ORDER — POTASSIUM CHLORIDE CRYS ER 20 MEQ PO TBCR: 40.0000 meq | EXTENDED_RELEASE_TABLET | Freq: Once | ORAL | Status: DC

## 2020-08-28 MED ORDER — METHADONE HCL 10 MG PO TABS
130.0000 mg | ORAL_TABLET | Freq: Every day | ORAL | Status: DC
Start: 2020-08-28 — End: 2020-08-28

## 2020-08-28 NOTE — Progress Notes (Signed)
PROGRESS NOTE    Gabrielle Nguyen  TIR:[id number] DOB: [birth year] DOA: 08/26/2020 PCP: Duard Brady, MD (Inactive)   Chief Complaint  Patient presents with   Emesis   Dehydration    Brief Narrative:   Gabrielle Nguyen is a 24 y.o. female with medical history significant of opioid addiction, anxiety. Presenting with intractable nausea and vomiting. As per the patient she has been out of methadone for several days and comes in to ED for nausea and vomiting.   Assessment & Plan:   Active Problems:   Methadone withdrawal (HCC)   Nausea & vomiting   Methadone withdrawal symptoms of nausea and vomiting: Improving, but not resolved. Still requiring IV morphine , anti emetics.  methadone ordered.  Continue with symptomatic management with Anti emetics and IV fliuds.  IV morphine for pain.  Opioid withdrawal protocol in place.     Mild hypernatremia:  Resolved.   Anxiety:  On IV ativan.    Elevated liver enzymes:  Pt denies any abdominal pain, .  US abdomen ordered for further evaluation. No acute hepatobiliary findings.   Hypokalemia:  Replaced. Repeat level in the morning.    DVT prophylaxis: SCDs Code Status: Full code Family Communication: None at bedside Disposition:   Status is: Observation  The patient will require care spanning > 2 midnights and should be moved to inpatient because: Unsafe d/c plan, IV treatments appropriate due to intensity of illness or inability to take PO and Inpatient level of care appropriate due to severity of illness has persistent nausea and vomiting  Dispo: The patient is from: Home              Anticipated d/c is to: Home              Anticipated d/c date is: 2 days              Patient currently is not medically stable to d/c.       Consultants:   None  Procedures: None  Antimicrobials: None   Subjective: Persistent nausea, vomiting.   Objective: Vitals:   08/27/20 1220 08/28/20 0010 08/28/20 0454 08/28/20 1322   BP: 130/87 124/86 134/87 (!) 128/97  Pulse: (!) 105 93 (!) 101 86  Resp: 20 16 18  (!) 24  Temp: 99.7 F (37.6 C) 98.4 F (36.9 C) 98 F (36.7 C) 98.5 F (36.9 C)  TempSrc: Oral   Oral  SpO2: 97% 98% 98% 98%  Weight:      Height:        Intake/Output Summary (Last 24 hours) at 08/28/2020 1620 Last data filed at 08/28/2020 1030 Gross per 24 hour  Intake 120 ml  Output 500 ml  Net -380 ml   Filed Weights   08/26/20 0956  Weight: 56 kg    Examination:  General exam:alert  And comfortable.  Respiratory system:  Clear to auscultation, no wheezing.  Cardiovascular system:S1S2, RRR, no pedal edema.  Gastrointestinal system: Abdomen is soft, NT ND BS+ Central nervous system: Alert and oriented, non focal.  Extremities: no pedal edema.  Skin: No rashes seen.  Psychiatry: calm     Data Reviewed: I have personally reviewed following labs and imaging studies  CBC: Recent Labs  Lab 08/26/20 1012 08/27/20 0517  WBC 9.8 9.2  NEUTROABS 8.2*  --   HGB 11.9* 11.0*  HCT 37.3 35.5*  MCV 86.7 87.7  PLT 326 310    Basic Metabolic Panel: Recent Labs  Lab 08/26/20 1012 08/26/20 1357 08/27/20 0517  08/28/20 0428  NA 145  --  147* 143  K 3.4*  --  3.7 3.3*  CL 105  --  111 107  CO2 25  --  23 24  GLUCOSE 99  --  96 101*  BUN 38*  --  19 10  CREATININE 0.92  --  0.73 0.71  CALCIUM 9.3  --  9.2 9.0  MG  --  2.7*  --   --     GFR: Estimated Creatinine Clearance: 94.2 mL/min (by C-G formula based on SCr of 0.71 mg/dL).  Liver Function Tests: Recent Labs  Lab 08/26/20 1012 08/27/20 0517 08/28/20 0428  AST 35 164* 48*  ALT 18 72* 42  ALKPHOS 72 67 59  BILITOT 0.7 0.7 0.3  PROT 8.5* 7.5 7.1  ALBUMIN 4.6 4.1 3.9    CBG: No results for input(s): GLUCAP in the last 168 hours.   Recent Results (from the past 240 hour(s))  Resp Panel by RT-PCR (Flu A&B, Covid) Nasopharyngeal Swab     Status: None   Collection Time: 08/26/20 10:26 AM   Specimen: Nasopharyngeal  Swab; Nasopharyngeal(NP) swabs in vial transport medium  Result Value Ref Range Status   SARS Coronavirus 2 by RT PCR NEGATIVE NEGATIVE Final    Comment: (NOTE) SARS-CoV-2 target nucleic acids are NOT DETECTED.  The SARS-CoV-2 RNA is generally detectable in upper respiratory specimens during the acute phase of infection. The lowest concentration of SARS-CoV-2 viral copies this assay can detect is 138 copies/mL. A negative result does not preclude SARS-Cov-2 infection and should not be used as the sole basis for treatment or other patient management decisions. A negative result may occur with  improper specimen collection/handling, submission of specimen other than nasopharyngeal swab, presence of viral mutation(s) within the areas targeted by this assay, and inadequate number of viral copies(<138 copies/mL). A negative result must be combined with clinical observations, patient history, and epidemiological information. The expected result is Negative.  Fact Sheet for Patients:  BloggerCourse.com  Fact Sheet for Healthcare Providers:  SeriousBroker.it  This test is no t yet approved or cleared by the [place] FDA and  has been authorized for detection and/or diagnosis of SARS-CoV-2 by FDA under an Emergency Use Authorization (EUA). This EUA will remain  in effect (meaning this test can be used) for the duration of the COVID-19 declaration under Section 564(b)(1) of the Act, 21 U.S.C.section 360bbb-3(b)(1), unless the authorization is terminated  or revoked sooner.       Influenza A by PCR NEGATIVE NEGATIVE Final   Influenza B by PCR NEGATIVE NEGATIVE Final    Comment: (NOTE) The Xpert Xpress SARS-CoV-2/FLU/RSV plus assay is intended as an aid in the diagnosis of influenza from Nasopharyngeal swab specimens and should not be used as a sole basis for treatment. Nasal washings and aspirates are unacceptable for Xpert Xpress  SARS-CoV-2/FLU/RSV testing.  Fact Sheet for Patients: BloggerCourse.com  Fact Sheet for Healthcare Providers: SeriousBroker.it  This test is not yet approved or cleared by the [place] FDA and has been authorized for detection and/or diagnosis of SARS-CoV-2 by FDA under an Emergency Use Authorization (EUA). This EUA will remain in effect (meaning this test can be used) for the duration of the COVID-19 declaration under Section 564(b)(1) of the Act, 21 U.S.C. section 360bbb-3(b)(1), unless the authorization is terminated or revoked.  Performed at [facility], [address], [place], [place] [address]          Radiology Studies: US  Abdomen Limited RUQ (LIVER/GB)  Result Date: 08/27/2020 CLINICAL DATA:  Elevated LFTs. EXAM: ULTRASOUND ABDOMEN LIMITED RIGHT UPPER QUADRANT COMPARISON:  None. FINDINGS: Gallbladder: No gallstones or wall thickening visualized. No sonographic Murphy sign noted by sonographer. Common bile duct: Diameter: 2.2 mm. Liver: Focal echogenic region along the inter lobar fissure measuring 3.2 cm likely focal fatty infiltration. Portal vein is patent on color Doppler imaging with normal direction of blood flow towards the liver. Other: None. IMPRESSION: No acute hepatobiliary findings. Electronically Signed   By: Elberta Fortis M.D.   On: 08/27/2020 13:08        Scheduled Meds:  cloNIDine  0.1 mg Oral QID   Followed by   Melene Muller ON 08/29/2020] cloNIDine  0.1 mg Oral BH-qamhs   Followed by   Melene Muller ON 09/01/2020] cloNIDine  0.1 mg Oral QAC breakfast   methadone  130 mg Oral Daily   Continuous Infusions:  dextrose 5 % and 0.45% NaCl 75 mL/hr at 08/28/20 0941     LOS: 1 day        Kathlen Mody, MD [facility]   To contact the attending provider between 7A-7P or the covering provider during after hours 7P-7A, please log into the web site [email or web address] and access  using universal [place] password for that web site. If you do not have the password, please call the hospital operator.  08/28/2020, 4:20 PM

## 2020-08-29 LAB — BASIC METABOLIC PANEL
Anion gap: 12 (ref 5–15)
BUN: 8 mg/dL (ref 6–20)
CO2: 26 mmol/L (ref 22–32)
Calcium: 9 mg/dL (ref 8.9–10.3)
Chloride: 100 mmol/L (ref 98–111)
Creatinine, Ser: 0.61 mg/dL (ref 0.44–1.00)
GFR, Estimated: >60 mL/min (ref >60)
Glucose, Bld: 95 mg/dL (ref 70–99)
Potassium: 3.1 mmol/L — ABNORMAL LOW (ref 3.5–5.1)
Sodium: 138 mmol/L (ref 135–145)

## 2020-08-29 MED ORDER — POTASSIUM CHLORIDE CRYS ER 20 MEQ PO TBCR
40.0000 meq | EXTENDED_RELEASE_TABLET | Freq: Once | ORAL | Status: AC
  Administered 2020-08-29: 40 meq via ORAL
  Filled 2020-08-29: qty 2

## 2020-08-29 MED ORDER — CLONIDINE HCL 0.1 MG PO TABS: ORAL_TABLET | ORAL | 0 refills | Status: DC

## 2020-08-29 MED ORDER — ONDANSETRON 4 MG PO TBDP: 4.0000 mg | ORAL_TABLET | Freq: Four times a day (QID) | ORAL | 0 refills | Status: DC | PRN

## 2020-08-29 MED ORDER — POTASSIUM CHLORIDE ER 10 MEQ PO TBCR: 10.0000 meq | EXTENDED_RELEASE_TABLET | Freq: Every day | ORAL | 0 refills | Status: DC

## 2020-08-29 MED ORDER — LOPERAMIDE HCL 2 MG PO CAPS: 2.0000 mg | ORAL_CAPSULE | ORAL | 0 refills | Status: DC | PRN

## 2020-08-29 NOTE — Progress Notes (Signed)
CRITICAL VALUE ALERT  Critical Value: K+3.1  Date & Time Notied:  08/29/20 1100  Provider Notified: Blake Divine ,face to face  Orders Received/Actions taken: no new orders

## 2020-08-29 NOTE — Discharge Summary (Signed)
Physician Discharge Summary  Gabrielle Nguyen WKG:[id number] DOB: May 19, 1996 DOA: 08/26/2020  PCP: Duard Brady, MD (Inactive)  Admit date: 08/26/2020 Discharge date: 08/29/2020  Admitted From: Home Disposition: Home.   Recommendations for Outpatient Follow-up:  1. Follow up with PCP in 1-2 weeks 2. Please obtain BMP/CBC in one week Please follow up  With Behavioral health as recommended.   Discharge Condition:stable.  CODE STATUS:FULL CODE.  Diet recommendation: Heart Healthy  Brief/Interim Summary: Gabrielle Nguyen a 24 y.o.femalewith medical history significant ofopioid addiction, anxiety. Presenting with intractable nausea and vomiting. As per the patient she has been out of methadone for several days and comes in to ED for nausea and vomiting.   Discharge Diagnoses:  Active Problems:   Methadone withdrawal (HCC)   Nausea & vomiting  Methadone withdrawal symptoms of nausea and vomiting: Nausea and vomiting resolved. Methadone ordered.      Mild hypernatremia:  Resolved.   Anxiety:  On  ativan.    Elevated liver enzymes:  Pt denies any abdominal pain, .  US abdomen ordered for further evaluation. No acute hepatobiliary findings.   Hypokalemia:  Replaced.  Discharge Instructions  Discharge Instructions    Diet - low sodium heart healthy   Complete by: As directed    Discharge instructions   Complete by: As directed    Please follow up with your PCP in one week.     Allergies as of 08/29/2020   No Known Allergies     Medication List    STOP taking these medications   albuterol (2.5 MG/3ML) 0.083% nebulizer solution Commonly known as: PROVENTIL   albuterol 108 (90 Base) MCG/ACT inhaler Commonly known as: VENTOLIN HFA   buprenorphine-naloxone 8-2 mg Subl SL tablet Commonly known as: SUBOXONE     TAKE these medications   ALPRAZolam 1 MG tablet Commonly known as: XANAX Take 1 mg by mouth 2 (two) times daily as needed for anxiety.    APAP 325 MG tablet Take 2 tablets (650 mg total) by mouth every 6 (six) hours as needed for mild pain or headache.   loperamide 2 MG capsule Commonly known as: IMODIUM Take 1-2 capsules (2-4 mg total) by mouth as needed for diarrhea or loose stools (diarrhea).   methadone 10 MG/ML solution Commonly known as: DOLOPHINE Take 130 mg by mouth daily. Verified with on call personnel for [facility] [place]. Last dose was 08-23-20   omeprazole 20 MG capsule Commonly known as: PRILOSEC Take 20 mg by mouth daily.   prazosin 1 MG capsule Commonly known as: MINIPRESS Take 1 mg by mouth at bedtime.   sertraline 100 MG tablet Commonly known as: ZOLOFT Take 150 mg by mouth daily.       Follow-up Information    [facility] Follow up.   Specialty: Addiction Medicine Why: Follow up with substance abuse treatment at discharge.  Contact information: [address] [place] [address] [phone]        [facility] Follow up.   Why: Follow up with substance abuse treatment at dc.  Contact information: [address] [place] [address] [phone]        [facility] Follow up.   Specialty: Behavioral Health Why: Follow up with substance abuse treatment at discharge.  Contact information: [address] [place] [address] [phone]              No Known Allergies  Consultations:  None.  Procedures/Studies: DG Chest Portable 1 View  Result Date: 08/26/2020 CLINICAL DATA:  Withdrawal symptoms with weakness EXAM: PORTABLE CHEST 1 VIEW COMPARISON:  09/30/2014 FINDINGS: The heart size and mediastinal contours are within normal limits. Both lungs are clear. The visualized skeletal structures are unremarkable. IMPRESSION: No active disease. Electronically Signed   By: Alcide Clever M.D.   On: 08/26/2020 10:33   US Abdomen Limited RUQ (LIVER/GB)  Result Date:  08/27/2020 CLINICAL DATA:  Elevated LFTs. EXAM: ULTRASOUND ABDOMEN LIMITED RIGHT UPPER QUADRANT COMPARISON:  None. FINDINGS: Gallbladder: No gallstones or wall thickening visualized. No sonographic Murphy sign noted by sonographer. Common bile duct: Diameter: 2.2 mm. Liver: Focal echogenic region along the inter lobar fissure measuring 3.2 cm likely focal fatty infiltration. Portal vein is patent on color Doppler imaging with normal direction of blood flow towards the liver. Other: None. IMPRESSION: No acute hepatobiliary findings. Electronically Signed   By: Elberta Fortis M.D.   On: 08/27/2020 13:08       Subjective:  No new complaints of nausea,vomiting or diarrhea.  Discharge Exam: Vitals:   08/29/20 0440 08/29/20 1002  BP: 126/89 (!) 124/95  Pulse: (!) 102 (!) 107  Resp:    Temp: 98.1 F (36.7 C)   SpO2: 97%    Vitals:   08/28/20 2026 08/28/20 2154 08/29/20 0440 08/29/20 1002  BP: 127/89 (!) 125/95 126/89 (!) 124/95  Pulse: 94 (!) 101 (!) 102 (!) 107  Resp: 14 14    Temp: 99.3 F (37.4 C) 97.8 F (36.6 C) 98.1 F (36.7 C)   TempSrc: Oral Oral    SpO2: 96% 97% 97%   Weight:      Height:        General: Pt is alert, awake, not in acute distress Cardiovascular: RRR, S1/S2 +, no rubs, no gallops Respiratory: CTA bilaterally, no wheezing, no rhonchi Abdominal: Soft, NT, ND, bowel sounds + Extremities: no edema, no cyanosis    The results of significant diagnostics from this hospitalization (including imaging, microbiology, ancillary and laboratory) are listed below for reference.     Microbiology: Recent Results (from the past 240 hour(s))  Resp Panel by RT-PCR (Flu A&B, Covid) Nasopharyngeal Swab     Status: None   Collection Time: 08/26/20 10:26 AM   Specimen: Nasopharyngeal Swab; Nasopharyngeal(NP) swabs in vial transport medium  Result Value Ref Range Status   SARS Coronavirus 2 by RT PCR NEGATIVE NEGATIVE Final    Comment: (NOTE) SARS-CoV-2 target nucleic  acids are NOT DETECTED.  The SARS-CoV-2 RNA is generally detectable in upper respiratory specimens during the acute phase of infection. The lowest concentration of SARS-CoV-2 viral copies this assay can detect is 138 copies/mL. A negative result does not preclude SARS-Cov-2 infection and should not be used as the sole basis for treatment or other patient management decisions. A negative result may occur with  improper specimen collection/handling, submission of specimen other than nasopharyngeal swab, presence of viral mutation(s) within the areas targeted by this assay, and inadequate number of viral copies(<138 copies/mL). A negative result must be combined with clinical observations, patient history, and epidemiological information. The expected result is Negative.  Fact Sheet for Patients:  BloggerCourse.com  Fact Sheet for Healthcare Providers:  SeriousBroker.it  This test is no t yet approved or cleared by the [place] FDA and  has been authorized for detection and/or diagnosis of SARS-CoV-2 by FDA under an Emergency Use Authorization (EUA). This EUA will remain  in effect (meaning this test can be used)  for the duration of the COVID-19 declaration under Section 564(b)(1) of the Act, 21 U.S.C.section 360bbb-3(b)(1), unless the authorization is terminated  or revoked sooner.       Influenza A by PCR NEGATIVE NEGATIVE Final   Influenza B by PCR NEGATIVE NEGATIVE Final    Comment: (NOTE) The Xpert Xpress SARS-CoV-2/FLU/RSV plus assay is intended as an aid in the diagnosis of influenza from Nasopharyngeal swab specimens and should not be used as a sole basis for treatment. Nasal washings and aspirates are unacceptable for Xpert Xpress SARS-CoV-2/FLU/RSV testing.  Fact Sheet for Patients: BloggerCourse.com  Fact Sheet for Healthcare Providers: SeriousBroker.it  This  test is not yet approved or cleared by the [place] FDA and has been authorized for detection and/or diagnosis of SARS-CoV-2 by FDA under an Emergency Use Authorization (EUA). This EUA will remain in effect (meaning this test can be used) for the duration of the COVID-19 declaration under Section 564(b)(1) of the Act, 21 U.S.C. section 360bbb-3(b)(1), unless the authorization is terminated or revoked.  Performed at [facility], [address], [place], [place] [address]      Labs: BNP (last 3 results) No results for input(s): BNP in the last 8760 hours. Basic Metabolic Panel: Recent Labs  Lab 08/26/20 1012 08/26/20 1357 08/27/20 0517 08/28/20 0428 08/29/20 0518  NA 145  --  147* 143 138  K 3.4*  --  3.7 3.3* 3.1*  CL 105  --  111 107 100  CO2 25  --  23 24 26   GLUCOSE 99  --  96 101* 95  BUN 38*  --  19 10 8   CREATININE 0.92  --  0.73 0.71 0.61  CALCIUM 9.3  --  9.2 9.0 9.0  MG  --  2.7*  --   --   --    Liver Function Tests: Recent Labs  Lab 08/26/20 1012 08/27/20 0517 08/28/20 0428  AST 35 164* 48*  ALT 18 72* 42  ALKPHOS 72 67 59  BILITOT 0.7 0.7 0.3  PROT 8.5* 7.5 7.1  ALBUMIN 4.6 4.1 3.9   Recent Labs  Lab 08/26/20 1012  LIPASE 21   No results for input(s): AMMONIA in the last 168 hours. CBC: Recent Labs  Lab 08/26/20 1012 08/27/20 0517  WBC 9.8 9.2  NEUTROABS 8.2*  --   HGB 11.9* 11.0*  HCT 37.3 35.5*  MCV 86.7 87.7  PLT 326 310   Cardiac Enzymes: No results for input(s): CKTOTAL, CKMB, CKMBINDEX, TROPONINI in the last 168 hours. BNP: Invalid input(s): POCBNP CBG: No results for input(s): GLUCAP in the last 168 hours. D-Dimer No results for input(s): DDIMER in the last 72 hours. Hgb A1c No results for input(s): HGBA1C in the last 72 hours. Lipid Profile No results for input(s): CHOL, HDL, LDLCALC, TRIG, CHOLHDL, LDLDIRECT in the last 72 hours. Thyroid function studies No results for input(s): TSH, T4TOTAL,  T3FREE, THYROIDAB in the last 72 hours.  Invalid input(s): FREET3 Anemia work up No results for input(s): VITAMINB12, FOLATE, FERRITIN, TIBC, IRON, RETICCTPCT in the last 72 hours. Urinalysis    Component Value Date/Time   COLORURINE YELLOW 08/26/2020 1005   APPEARANCEUR CLEAR 08/26/2020 1005   LABSPEC 1.021 08/26/2020 1005   PHURINE 6.0 08/26/2020 1005   GLUCOSEU NEGATIVE 08/26/2020 1005   HGBUR NEGATIVE 08/26/2020 1005   BILIRUBINUR NEGATIVE 08/26/2020 1005   KETONESUR 20 (A) 08/26/2020 1005   PROTEINUR NEGATIVE 08/26/2020 1005   UROBILINOGEN 0.2 07/11/2015 2112   NITRITE NEGATIVE  08/26/2020 1005   LEUKOCYTESUR TRACE (A) 08/26/2020 1005   Sepsis Labs Invalid input(s): PROCALCITONIN,  WBC,  LACTICIDVEN Microbiology Recent Results (from the past 240 hour(s))  Resp Panel by RT-PCR (Flu A&B, Covid) Nasopharyngeal Swab     Status: None   Collection Time: 08/26/20 10:26 AM   Specimen: Nasopharyngeal Swab; Nasopharyngeal(NP) swabs in vial transport medium  Result Value Ref Range Status   SARS Coronavirus 2 by RT PCR NEGATIVE NEGATIVE Final    Comment: (NOTE) SARS-CoV-2 target nucleic acids are NOT DETECTED.  The SARS-CoV-2 RNA is generally detectable in upper respiratory specimens during the acute phase of infection. The lowest concentration of SARS-CoV-2 viral copies this assay can detect is 138 copies/mL. A negative result does not preclude SARS-Cov-2 infection and should not be used as the sole basis for treatment or other patient management decisions. A negative result may occur with  improper specimen collection/handling, submission of specimen other than nasopharyngeal swab, presence of viral mutation(s) within the areas targeted by this assay, and inadequate number of viral copies(<138 copies/mL). A negative result must be combined with clinical observations, patient history, and epidemiological information. The expected result is Negative.  Fact Sheet for Patients:   BloggerCourse.com  Fact Sheet for Healthcare Providers:  SeriousBroker.it  This test is no t yet approved or cleared by the [place] FDA and  has been authorized for detection and/or diagnosis of SARS-CoV-2 by FDA under an Emergency Use Authorization (EUA). This EUA will remain  in effect (meaning this test can be used) for the duration of the COVID-19 declaration under Section 564(b)(1) of the Act, 21 U.S.C.section 360bbb-3(b)(1), unless the authorization is terminated  or revoked sooner.       Influenza A by PCR NEGATIVE NEGATIVE Final   Influenza B by PCR NEGATIVE NEGATIVE Final    Comment: (NOTE) The Xpert Xpress SARS-CoV-2/FLU/RSV plus assay is intended as an aid in the diagnosis of influenza from Nasopharyngeal swab specimens and should not be used as a sole basis for treatment. Nasal washings and aspirates are unacceptable for Xpert Xpress SARS-CoV-2/FLU/RSV testing.  Fact Sheet for Patients: BloggerCourse.com  Fact Sheet for Healthcare Providers: SeriousBroker.it  This test is not yet approved or cleared by the [place] FDA and has been authorized for detection and/or diagnosis of SARS-CoV-2 by FDA under an Emergency Use Authorization (EUA). This EUA will remain in effect (meaning this test can be used) for the duration of the COVID-19 declaration under Section 564(b)(1) of the Act, 21 U.S.C. section 360bbb-3(b)(1), unless the authorization is terminated or revoked.  Performed at [facility], [address], [place], [place] [address]      Time coordinating discharge: 31 minutes.   SIGNED:   Kathlen Mody, MD  [facility]

## 2020-08-29 NOTE — TOC Transition Note (Signed)
Transition of Care [facility]) - CM/SW Discharge Note   Patient Details  Name: Ludwika Rodd MRN: [id number] Date of Birth: [birth year]  [facility] CM/SW Contact:  Lanier Clam, RN Phone Number: 08/29/2020, 12:51 PM   Clinical Narrative: Provided w/Substance abuse resources. No further CM needs.      Final next level of care: Home/Self Care Barriers to Discharge: No Barriers Identified   Patient Goals and CMS Choice Patient states their goals for this hospitalization and ongoing recovery are:: go home CMS Medicare.gov Compare Post Acute Care list provided to:: Patient    Discharge Placement                       Discharge Plan and Services   Discharge Planning Services: CM Consult                                 Social Determinants of Health (SDOH) Interventions     Readmission Risk Interventions No flowsheet data found.

## 2020-08-29 NOTE — Progress Notes (Signed)
Patient remains stable at this time alert, oriented(x4), ambulatory without assistance. Discharge instructions were reviewed, pt denied questions or concerns.

## 2020-09-02 NOTE — L&D Delivery Note (Addendum)
OB/GYN Faculty Practice Delivery Note  Gabrielle Nguyen is a 25 y.o. [id number] s/p SVD at [redacted]w[redacted]d. She was admitted for SOL.   ROM: 0h 14m with clear fluid GBS Status: unknown, pending  Delivery Date/Time: 07/25/2021 Delivery: Called to room and patient was complete and pushing. Head delivered ROP. No nuchal cord present. Shoulder and body delivered in usual fashion. Infant with spontaneous cry, placed on mother's abdomen, dried and stimulated. Cord clamped x 2 after 1-minute delay, and cut by FOB under my direct supervision. Cord blood drawn. Placenta delivered spontaneously with gentle cord traction. Fundus firm with massage and Pitocin. Labia, perineum, vagina, and cervix were inspected, no tears were visualized.   Placenta: intact, 3VC - sent to L&D Complications: none Lacerations: none EBL: 100 cc Analgesia: epidural  Infant: viable female  APGARs 7 and 9  2260 g  Gabrielle Cox, MD PGY1 07/25/2021, 3:09 PM  Midwife attestation: I was gloved and present for delivery in its entirety and I agree with the above resident's note.  TXA was given immediately after delivery given pt PPH risks and low admission Hgb.    Gabrielle Nguyen, CNM 7:21 PM

## 2020-09-21 ENCOUNTER — Encounter (HOSPITAL_COMMUNITY): Payer: Self-pay

## 2020-09-21 DIAGNOSIS — Z91138 Patient's unintentional underdosing of medication regimen for other reason: Secondary | ICD-10-CM

## 2020-09-21 DIAGNOSIS — F32A Depression, unspecified: Secondary | ICD-10-CM | POA: Diagnosis present

## 2020-09-21 DIAGNOSIS — K5900 Constipation, unspecified: Secondary | ICD-10-CM | POA: Diagnosis present

## 2020-09-21 DIAGNOSIS — D649 Anemia, unspecified: Secondary | ICD-10-CM | POA: Diagnosis present

## 2020-09-21 DIAGNOSIS — Z87891 Personal history of nicotine dependence: Secondary | ICD-10-CM

## 2020-09-21 DIAGNOSIS — E860 Dehydration: Secondary | ICD-10-CM | POA: Diagnosis present

## 2020-09-21 DIAGNOSIS — E876 Hypokalemia: Principal | ICD-10-CM | POA: Diagnosis present

## 2020-09-21 DIAGNOSIS — T403X6A Underdosing of methadone, initial encounter: Secondary | ICD-10-CM | POA: Diagnosis present

## 2020-09-21 DIAGNOSIS — Z8249 Family history of ischemic heart disease and other diseases of the circulatory system: Secondary | ICD-10-CM

## 2020-09-21 DIAGNOSIS — F1123 Opioid dependence with withdrawal: Secondary | ICD-10-CM | POA: Diagnosis present

## 2020-09-21 DIAGNOSIS — F419 Anxiety disorder, unspecified: Secondary | ICD-10-CM | POA: Diagnosis present

## 2020-09-21 DIAGNOSIS — Z20822 Contact with and (suspected) exposure to covid-19: Secondary | ICD-10-CM | POA: Diagnosis present

## 2020-09-21 DIAGNOSIS — R112 Nausea with vomiting, unspecified: Secondary | ICD-10-CM | POA: Diagnosis present

## 2020-09-21 LAB — CBC
HCT: 37.1 % (ref 36.0–46.0)
Hemoglobin: 12.1 g/dL (ref 12.0–15.0)
MCH: 27.8 pg (ref 26.0–34.0)
MCHC: 32.6 g/dL (ref 30.0–36.0)
MCV: 85.1 fL (ref 80.0–100.0)
Platelets: 334 10*3/uL (ref 150–400)
RBC: 4.36 MIL/uL (ref 3.87–5.11)
RDW: 15.5 % (ref 11.5–15.5)
WBC: 8.7 10*3/uL (ref 4.0–10.5)
nRBC: 0 % (ref 0.0–0.2)

## 2020-09-21 LAB — LIPASE, BLOOD: Lipase: 25 U/L (ref 11–51)

## 2020-09-21 LAB — COMPREHENSIVE METABOLIC PANEL
ALT: 12 U/L (ref 0–44)
AST: 19 U/L (ref 15–41)
Albumin: 4.5 g/dL (ref 3.5–5.0)
Alkaline Phosphatase: 137 U/L — ABNORMAL HIGH (ref 38–126)
Anion gap: 16 — ABNORMAL HIGH (ref 5–15)
BUN: 16 mg/dL (ref 6–20)
CO2: 24 mmol/L (ref 22–32)
Calcium: 9.8 mg/dL (ref 8.9–10.3)
Chloride: 101 mmol/L (ref 98–111)
Creatinine, Ser: 0.79 mg/dL (ref 0.44–1.00)
GFR, Estimated: >60 mL/min (ref >60)
Glucose, Bld: 119 mg/dL — ABNORMAL HIGH (ref 70–99)
Potassium: 3.2 mmol/L — ABNORMAL LOW (ref 3.5–5.1)
Sodium: 141 mmol/L (ref 135–145)
Total Bilirubin: 0.4 mg/dL (ref 0.3–1.2)
Total Protein: 8.8 g/dL — ABNORMAL HIGH (ref 6.5–8.1)

## 2020-09-21 LAB — I-STAT BETA HCG BLOOD, ED (MC, WL, AP ONLY): I-stat hCG, quantitative: <5 m[IU]/mL (ref <5)

## 2020-09-21 NOTE — ED Triage Notes (Signed)
Pt arrived via EMS, from home, vomiting since 7am. States hx of same.

## 2020-09-21 NOTE — ED Notes (Signed)
Pt will not put mask on and is vomiting without holding a emesis bag to her mouth.

## 2020-09-22 ENCOUNTER — Other Ambulatory Visit: Payer: Self-pay

## 2020-09-22 ENCOUNTER — Observation Stay (HOSPITAL_COMMUNITY): Payer: Medicaid Other

## 2020-09-22 ENCOUNTER — Inpatient Hospital Stay (HOSPITAL_COMMUNITY)
Admission: EM | Admit: 2020-09-22 | Discharge: 2020-09-24 | DRG: 641 | Disposition: A | Discharge status: Home / Self Care | Payer: Medicaid Other | Attending: Student | Admitting: Student

## 2020-09-22 DIAGNOSIS — R112 Nausea with vomiting, unspecified: Secondary | ICD-10-CM | POA: Diagnosis present

## 2020-09-22 LAB — RAPID URINE DRUG SCREEN, HOSP PERFORMED
Amphetamines: NOT DETECTED
Barbiturates: NOT DETECTED
Benzodiazepines: POSITIVE — AB
Cocaine: NOT DETECTED
Opiates: POSITIVE — AB
Tetrahydrocannabinol: NOT DETECTED

## 2020-09-22 LAB — MAGNESIUM: Magnesium: 2.2 mg/dL (ref 1.7–2.4)

## 2020-09-22 LAB — SARS CORONAVIRUS 2 (TAT 6-24 HRS): SARS Coronavirus 2: NEGATIVE

## 2020-09-22 MED ORDER — SODIUM CHLORIDE 0.9 % IV BOLUS
500.0000 mL | Freq: Once | INTRAVENOUS | Status: AC
  Administered 2020-09-22: 500 mL via INTRAVENOUS

## 2020-09-22 MED ORDER — ONDANSETRON HCL 4 MG/2ML IJ SOLN
4.0000 mg | Freq: Once | INTRAMUSCULAR | Status: AC
  Administered 2020-09-22: 4 mg via INTRAVENOUS
  Filled 2020-09-22: qty 2

## 2020-09-22 MED ORDER — LORAZEPAM 2 MG/ML IJ SOLN
0.5000 mg | Freq: Four times a day (QID) | INTRAMUSCULAR | Status: DC | PRN
  Administered 2020-09-22 – 2020-09-24 (×8): 0.5 mg via INTRAVENOUS
  Filled 2020-09-22 (×8): qty 1

## 2020-09-22 MED ORDER — BISACODYL 10 MG RE SUPP: 10.0000 mg | Freq: Every day | RECTAL | Status: DC | PRN

## 2020-09-22 MED ORDER — BUPRENORPHINE HCL-NALOXONE HCL 8-2 MG SL SUBL
1.0000 | SUBLINGUAL_TABLET | Freq: Every day | SUBLINGUAL | Status: DC
  Administered 2020-09-22: 1 via SUBLINGUAL
  Filled 2020-09-22: qty 1

## 2020-09-22 MED ORDER — HYDROMORPHONE HCL 1 MG/ML IJ SOLN
1.0000 mg | Freq: Once | INTRAMUSCULAR | Status: AC
  Administered 2020-09-22: 1 mg via INTRAVENOUS
  Filled 2020-09-22: qty 1

## 2020-09-22 MED ORDER — IOHEXOL 300 MG/ML  SOLN
100.0000 mL | Freq: Once | INTRAMUSCULAR | Status: AC | PRN
  Administered 2020-09-22: 100 mL via INTRAVENOUS

## 2020-09-22 MED ORDER — SODIUM CHLORIDE 0.9 % IV BOLUS
1000.0000 mL | Freq: Once | INTRAVENOUS | Status: AC
  Administered 2020-09-22: 1000 mL via INTRAVENOUS

## 2020-09-22 MED ORDER — POTASSIUM CHLORIDE 10 MEQ/100ML IV SOLN
10.0000 meq | INTRAVENOUS | Status: AC
  Administered 2020-09-22 (×5): 10 meq via INTRAVENOUS
  Filled 2020-09-22 (×5): qty 100

## 2020-09-22 MED ORDER — PROMETHAZINE HCL 25 MG/ML IJ SOLN
12.5000 mg | Freq: Once | INTRAMUSCULAR | Status: AC
  Administered 2020-09-22: 12.5 mg via INTRAVENOUS
  Filled 2020-09-22: qty 1

## 2020-09-22 MED ORDER — PRAZOSIN HCL 1 MG PO CAPS
1.0000 mg | ORAL_CAPSULE | Freq: Every day | ORAL | Status: DC
  Administered 2020-09-22 – 2020-09-23 (×2): 1 mg via ORAL
  Filled 2020-09-22 (×2): qty 1

## 2020-09-22 MED ORDER — ACETAMINOPHEN 650 MG RE SUPP: 650.0000 mg | Freq: Four times a day (QID) | RECTAL | Status: DC | PRN

## 2020-09-22 MED ORDER — THIAMINE HCL 100 MG/ML IJ SOLN
Freq: Once | INTRAVENOUS | Status: AC
  Filled 2020-09-22: qty 1000

## 2020-09-22 MED ORDER — POTASSIUM CHLORIDE 10 MEQ/100ML IV SOLN
10.0000 meq | Freq: Once | INTRAVENOUS | Status: AC
  Administered 2020-09-22: 10 meq via INTRAVENOUS
  Filled 2020-09-22: qty 100

## 2020-09-22 MED ORDER — SERTRALINE HCL 50 MG PO TABS
150.0000 mg | ORAL_TABLET | Freq: Every day | ORAL | Status: DC
  Administered 2020-09-23 – 2020-09-24 (×2): 150 mg via ORAL
  Filled 2020-09-22 (×2): qty 1

## 2020-09-22 MED ORDER — PROCHLORPERAZINE EDISYLATE 10 MG/2ML IJ SOLN
10.0000 mg | Freq: Four times a day (QID) | INTRAMUSCULAR | Status: DC | PRN
  Administered 2020-09-22 – 2020-09-23 (×4): 10 mg via INTRAVENOUS
  Filled 2020-09-22 (×4): qty 2

## 2020-09-22 MED ORDER — ACETAMINOPHEN 325 MG PO TABS: 650.0000 mg | ORAL_TABLET | Freq: Four times a day (QID) | ORAL | Status: DC | PRN

## 2020-09-22 MED ORDER — MORPHINE SULFATE (PF) 2 MG/ML IV SOLN
1.0000 mg | Freq: Four times a day (QID) | INTRAVENOUS | Status: DC
  Administered 2020-09-22 – 2020-09-23 (×5): 1 mg via INTRAVENOUS
  Filled 2020-09-22 (×5): qty 1

## 2020-09-22 MED ORDER — DOCUSATE SODIUM 100 MG PO CAPS
100.0000 mg | ORAL_CAPSULE | Freq: Two times a day (BID) | ORAL | Status: DC
  Administered 2020-09-22 – 2020-09-23 (×2): 100 mg via ORAL
  Filled 2020-09-22 (×2): qty 1

## 2020-09-22 NOTE — ED Notes (Signed)
Pt made aware of need for urine specimen 
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
## 2020-09-22 NOTE — Progress Notes (Signed)
°   09/22/20 1936  Assess: MEWS Score  Temp 99.5 F (37.5 C)  BP 127/74  Pulse Rate (!) 141  Resp 18  SpO2 97 %  Assess: MEWS Score  MEWS Temp 0  MEWS Systolic 0  MEWS Pulse 3  MEWS RR 0  MEWS LOC 0  MEWS Score 3  MEWS Score Color Yellow  Assess: if the MEWS score is Yellow or Red  Were vital signs taken at a resting state? Yes  Focused Assessment No change from prior assessment  Early Detection of Sepsis Score *See Row Information* Low  MEWS guidelines implemented *See Row Information* Yes  Treat  MEWS Interventions Administered scheduled meds/treatments;Escalated (See documentation below)  Take Vital Signs  Increase Vital Sign Frequency  Yellow: Q 2hr X 2 then Q 4hr X 2, if remains yellow, continue Q 4hrs  Escalate  MEWS: Escalate Yellow: discuss with charge nurse/RN and consider discussing with provider and RRT  Notify: Charge Nurse/RN  Name of Charge Nurse/RN Notified Tommy Medal  Date Charge Nurse/RN Notified 09/22/20  Time Charge Nurse/RN Notified 1938  Notify: Provider  Provider Name/Title Bl[place] NP  Date Provider Notified 09/22/20  Time Provider Notified [phone]  Notification Type Page  Notification Reason Change in status  Response No new orders  Date of Provider Response  (awaiting response)  Document  Progress note created (see row info) Yes

## 2020-09-22 NOTE — ED Notes (Signed)
Pt c/o RLQ abdominal pain, nausea continues. Medicated per physician orders. Notified physician
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
## 2020-09-22 NOTE — ED Provider Notes (Signed)
  Physical Exam  BP 121/81 (BP Location: Left Arm)   Pulse 95   Temp 98.6 F (37 C)   Resp 18   SpO2 100%   Physical Exam  ED Course/Procedures     Procedures  MDM  Patient care assumed from Margaux V PA at shift change, please see her note for a full HPI. Briefly, patient here with withdraw from methadone, reports her last dose was about two days ago. Plan was to provide narcotics for control in withdraw symptoms, and likely admission into the hospital. She was given prior to me assuming care, dilaudid 1 mg, potassium and bolus.   8:22 AM Patient evaluated by me, continues to sit up in her bed with holding over emesis bag without visual active vomiting by me. Tachycardia noted with a HR of 115, blood pressure slightly elevated but no tachypnea.   Discussed care of patient with Dr. Jonn Shingles who agree with treatment with Suboxone at this time. She was given medication along with a second bag of fluids to help with dehydration from her vomiting.  Pharmacy technician on shift, called methadone clinic, stating that she did receive methadone medication yesterday 130 mg, she reports she vomited this medication and has not been to keep anything down. CT abdomen has been order. Last [place] of her abdomen for similar presentation showed no acute pathology in the month of December. Vitals remain stable.   9:40 AM Spoke to hospitalist Dr. Ronaldo Miyamoto who will admit patient for further management. Patient is stable for discharge.   Portions of this note were generated with Scientist, clinical (histocompatibility and immunogenetics). Dictation errors may occur despite best attempts at proofreading.       Claude Manges, PA-C 09/22/20 0940    Molpus, Jonny Ruiz, MD 09/22/20 2240    Paula Libra, MD 09/22/20 2240

## 2020-09-23 DIAGNOSIS — E860 Dehydration: Secondary | ICD-10-CM | POA: Diagnosis present

## 2020-09-23 DIAGNOSIS — F1123 Opioid dependence with withdrawal: Secondary | ICD-10-CM

## 2020-09-23 DIAGNOSIS — T403X6A Underdosing of methadone, initial encounter: Secondary | ICD-10-CM | POA: Diagnosis present

## 2020-09-23 DIAGNOSIS — K5900 Constipation, unspecified: Secondary | ICD-10-CM | POA: Diagnosis present

## 2020-09-23 DIAGNOSIS — F419 Anxiety disorder, unspecified: Secondary | ICD-10-CM | POA: Diagnosis present

## 2020-09-23 DIAGNOSIS — E876 Hypokalemia: Secondary | ICD-10-CM | POA: Diagnosis present

## 2020-09-23 DIAGNOSIS — Z91138 Patient's unintentional underdosing of medication regimen for other reason: Secondary | ICD-10-CM | POA: Diagnosis not present

## 2020-09-23 DIAGNOSIS — R112 Nausea with vomiting, unspecified: Secondary | ICD-10-CM | POA: Diagnosis present

## 2020-09-23 DIAGNOSIS — F32A Depression, unspecified: Secondary | ICD-10-CM | POA: Diagnosis present

## 2020-09-23 DIAGNOSIS — D649 Anemia, unspecified: Secondary | ICD-10-CM | POA: Diagnosis present

## 2020-09-23 DIAGNOSIS — R1011 Right upper quadrant pain: Secondary | ICD-10-CM | POA: Diagnosis not present

## 2020-09-23 DIAGNOSIS — Z20822 Contact with and (suspected) exposure to covid-19: Secondary | ICD-10-CM | POA: Diagnosis present

## 2020-09-23 DIAGNOSIS — D509 Iron deficiency anemia, unspecified: Secondary | ICD-10-CM | POA: Diagnosis not present

## 2020-09-23 DIAGNOSIS — Z8249 Family history of ischemic heart disease and other diseases of the circulatory system: Secondary | ICD-10-CM | POA: Diagnosis not present

## 2020-09-23 DIAGNOSIS — Z87891 Personal history of nicotine dependence: Secondary | ICD-10-CM | POA: Diagnosis not present

## 2020-09-23 LAB — CBC
HCT: 30.4 % — ABNORMAL LOW (ref 36.0–46.0)
Hemoglobin: 9.5 g/dL — ABNORMAL LOW (ref 12.0–15.0)
MCH: 27.6 pg (ref 26.0–34.0)
MCHC: 31.3 g/dL (ref 30.0–36.0)
MCV: 88.4 fL (ref 80.0–100.0)
Platelets: 241 10*3/uL (ref 150–400)
RBC: 3.44 MIL/uL — ABNORMAL LOW (ref 3.87–5.11)
RDW: 16.4 % — ABNORMAL HIGH (ref 11.5–15.5)
WBC: 5.7 10*3/uL (ref 4.0–10.5)
nRBC: 0 % (ref 0.0–0.2)

## 2020-09-23 LAB — COMPREHENSIVE METABOLIC PANEL
ALT: 8 U/L (ref 0–44)
AST: 15 U/L (ref 15–41)
Albumin: 3.6 g/dL (ref 3.5–5.0)
Alkaline Phosphatase: 74 U/L (ref 38–126)
Anion gap: 12 (ref 5–15)
BUN: 9 mg/dL (ref 6–20)
CO2: 20 mmol/L — ABNORMAL LOW (ref 22–32)
Calcium: 8.6 mg/dL — ABNORMAL LOW (ref 8.9–10.3)
Chloride: 108 mmol/L (ref 98–111)
Creatinine, Ser: 0.61 mg/dL (ref 0.44–1.00)
GFR, Estimated: >60 mL/min (ref >60)
Glucose, Bld: 87 mg/dL (ref 70–99)
Potassium: 3.5 mmol/L (ref 3.5–5.1)
Sodium: 140 mmol/L (ref 135–145)
Total Bilirubin: 0.8 mg/dL (ref 0.3–1.2)
Total Protein: 6.7 g/dL (ref 6.5–8.1)

## 2020-09-23 MED ORDER — POTASSIUM CHLORIDE 10 MEQ/100ML IV SOLN
10.0000 meq | INTRAVENOUS | Status: AC
  Administered 2020-09-23 (×4): 10 meq via INTRAVENOUS
  Filled 2020-09-23 (×4): qty 100

## 2020-09-23 MED ORDER — SENNOSIDES-DOCUSATE SODIUM 8.6-50 MG PO TABS
1.0000 | ORAL_TABLET | Freq: Two times a day (BID) | ORAL | Status: DC
  Administered 2020-09-23 – 2020-09-24 (×2): 1 via ORAL
  Filled 2020-09-23 (×2): qty 1

## 2020-09-23 MED ORDER — POTASSIUM CHLORIDE 2 MEQ/ML IV SOLN: INTRAVENOUS | Status: DC

## 2020-09-23 MED ORDER — MORPHINE SULFATE (PF) 2 MG/ML IV SOLN
1.0000 mg | INTRAVENOUS | Status: DC | PRN
  Administered 2020-09-23 – 2020-09-24 (×3): 1 mg via INTRAVENOUS
  Filled 2020-09-23 (×3): qty 1

## 2020-09-23 MED ORDER — ONDANSETRON HCL 4 MG/2ML IJ SOLN
4.0000 mg | Freq: Three times a day (TID) | INTRAMUSCULAR | Status: DC | PRN
  Administered 2020-09-23 – 2020-09-24 (×2): 4 mg via INTRAVENOUS
  Filled 2020-09-23 (×2): qty 2

## 2020-09-23 MED ORDER — KCL-LACTATED RINGERS-D5W 20 MEQ/L IV SOLN
INTRAVENOUS | Status: DC
  Filled 2020-09-23 (×2): qty 1000

## 2020-09-23 MED ORDER — METHADONE HCL 10 MG/ML PO CONC
80.0000 mg | Freq: Every day | ORAL | Status: DC
Start: 2020-09-23 — End: 2020-09-24
  Administered 2020-09-23: 80 mg via ORAL
  Filled 2020-09-23: qty 8

## 2020-09-23 NOTE — Plan of Care (Signed)
  Problem: Activity: Goal: Risk for activity intolerance will decrease Outcome: Progressing   Problem: Nutrition: Goal: Adequate nutrition will be maintained Outcome: Progressing   Problem: Coping: Goal: Level of anxiety will decrease Outcome: Progressing   Problem: Pain Managment: Goal: General experience of comfort will improve Outcome: Progressing   

## 2020-09-23 NOTE — Progress Notes (Signed)
PROGRESS NOTE  Gabrielle Nguyen GYI:[id number] DOB: Mar 20, 1996   PCP: Duard Brady, MD (Inactive)  Patient is from: Home  DOA: 09/22/2020 LOS: 0  Chief complaints: Nausea/vomiting/abdominal pain  Brief Narrative / Interim history: 25 year old female with PMH of opiate abuse now on methadone, anxiety and depression presenting with the above complaint for 1 day.  She is on methadone for opiate withdrawal.  Reportedly missed a dose on the day of admission.  CT abdomen and pelvis without significant finding other than slightly dilated common hepatic duct to 1 cm with CBD measuring 0.7 cm.  However, lipase and LFT within normal.  She was started on IV fluid, antiemetics and as needed morphine and admitted.  Subjective: Seen and examined earlier this morning.  No major events overnight of this morning.  She is a sleepy but wakes to voice although she falls back asleep quickly.  She is oriented x4 but not able to stay awake long.  She complains of pain all over.  She rates her pain 10/10.  No emesis but ongoing nausea.  Objective: Vitals:   09/23/20 0404 09/23/20 0548 09/23/20 0549 09/23/20 1329  BP: 108/64 111/67 111/67 (!) 133/96  Pulse: (!) 106 99 99 (!) 106  Resp: (!) 24 (!) 24  18  Temp: 97.9 F (36.6 C) 98 F (36.7 C) 98 F (36.7 C) 98.9 F (37.2 C)  TempSrc: Axillary Oral Axillary Oral  SpO2: 97% 98% 98% 97%    Intake/Output Summary (Last 24 hours) at 09/23/2020 1356 Last data filed at 09/22/2020 2200 Gross per 24 hour  Intake 220 ml  Output -  Net 220 ml   There were no vitals filed for this visit.  Examination:  GENERAL: No apparent distress.  Nontoxic. HEENT: MMM.  Vision and hearing grossly intact.  NECK: Supple.  No apparent JVD.  RESP: On RA.  No IWOB.  Fair aeration bilaterally. CVS:  RRR. Heart sounds normal.  ABD/GI/GU: BS+. Abd soft, NTND.  MSK/EXT:  Moves extremities. No apparent deformity. No edema.  SKIN: no apparent skin lesion or wound NEURO: Sleepy  but wakes to voice.  Oriented x4.  No apparent focal neuro deficit. PSYCH: Calm. Normal affect.  Procedures:  None  Microbiology summarized: COVID-19 PCR nonreactive.  Assessment & Plan: Intractable nausea/vomiting/abdominal pain-likely opiate withdrawal.  Recurrent issue.  CT abdomen and pelvis with constipation, mild common hepatic duct dilation to 1.0 cm and CBD to 0.7.  LFT and lipase within normal. -Change Compazine to Zofran given increased risk of sedation with opiate. -Resume methadone at 80 mg daily and titrate up to home dose, 130 mg daily -IV morphine for breakthrough pain -Start scheduled Senokot-S for constipation -Advance diet to full liquid diet -IV fluid -TOC consulted  Hypokalemia: K3.2>>3.5. -K-Dur 40 mEq x 1  Normocytic anemia: Hgb 9.5.  Hemodilution? -Check anemia panel  Anxiety/depression: Stable.  Denies SI or HI. -Continue home medications   There is no height or weight on file to calculate BMI.         DVT prophylaxis:  SCDs Start: 09/22/20 1756  Code Status: Full code Family Communication: Updated patient's husband over the phone with patient's permission. Status is: Observation  The patient will require care spanning > 2 midnights and should be moved to inpatient because: Persistent severe electrolyte disturbances, IV treatments appropriate due to intensity of illness or inability to take PO and Inpatient level of care appropriate due to severity of illness  Dispo: The patient is from: Home  Anticipated d/c is to: Home              Anticipated d/c date is: 1 day              Patient currently is not medically stable to d/c.   Difficult to place patient No       Consultants:  None   Sch Meds:  Scheduled Meds: . methadone  80 mg Oral Daily  . prazosin  1 mg Oral QHS  . senna-docusate  1 tablet Oral BID  . sertraline  150 mg Oral Daily   Continuous Infusions: . lactated ringers with kcl     PRN Meds:.acetaminophen  **OR** acetaminophen, bisacodyl, LORazepam, morphine injection, ondansetron (ZOFRAN) IV  Antimicrobials: Anti-infectives (From admission, onward)   None       I have personally reviewed the following labs and images: CBC: Recent Labs  Lab 09/21/20 1814 09/23/20 0036  WBC 8.7 5.7  HGB 12.1 9.5*  HCT 37.1 30.4*  MCV 85.1 88.4  PLT 334 241   BMP &GFR Recent Labs  Lab 09/21/20 1814 09/22/20 0547 09/23/20 0036  NA 141  --  140  K 3.2*  --  3.5  CL 101  --  108  CO2 24  --  20*  GLUCOSE 119*  --  87  BUN 16  --  9  CREATININE 0.79  --  0.61  CALCIUM 9.8  --  8.6*  MG  --  2.2  --    CrCl cannot be calculated (Unknown ideal weight.). Liver & Pancreas: Recent Labs  Lab 09/21/20 1814 09/23/20 0036  AST 19 15  ALT 12 8  ALKPHOS 137* 74  BILITOT 0.4 0.8  PROT 8.8* 6.7  ALBUMIN 4.5 3.6   Recent Labs  Lab 09/21/20 1814  LIPASE 25   No results for input(s): AMMONIA in the last 168 hours. Diabetic: No results for input(s): HGBA1C in the last 72 hours. No results for input(s): GLUCAP in the last 168 hours. Cardiac Enzymes: No results for input(s): CKTOTAL, CKMB, CKMBINDEX, TROPONINI in the last 168 hours. No results for input(s): PROBNP in the last 8760 hours. Coagulation Profile: No results for input(s): INR, PROTIME in the last 168 hours. Thyroid Function Tests: No results for input(s): TSH, T4TOTAL, FREET4, T3FREE, THYROIDAB in the last 72 hours. Lipid Profile: No results for input(s): CHOL, HDL, LDLCALC, TRIG, CHOLHDL, LDLDIRECT in the last 72 hours. Anemia Panel: No results for input(s): VITAMINB12, FOLATE, FERRITIN, TIBC, IRON, RETICCTPCT in the last 72 hours. Urine analysis:    Component Value Date/Time   COLORURINE YELLOW 08/26/2020 1005   APPEARANCEUR CLEAR 08/26/2020 1005   LABSPEC 1.021 08/26/2020 1005   PHURINE 6.0 08/26/2020 1005   GLUCOSEU NEGATIVE 08/26/2020 1005   HGBUR NEGATIVE 08/26/2020 1005   BILIRUBINUR NEGATIVE 08/26/2020 1005    KETONESUR 20 (A) 08/26/2020 1005   PROTEINUR NEGATIVE 08/26/2020 1005   UROBILINOGEN 0.2 07/11/2015 2112   NITRITE NEGATIVE 08/26/2020 1005   LEUKOCYTESUR TRACE (A) 08/26/2020 1005   Sepsis Labs: Invalid input(s): PROCALCITONIN, LACTICIDVEN  Microbiology: Recent Results (from the past 240 hour(s))  SARS CORONAVIRUS 2 (TAT 6-24 HRS) Nasopharyngeal Nasopharyngeal Swab     Status: None   Collection Time: 09/22/20  5:47 AM   Specimen: Nasopharyngeal Swab  Result Value Ref Range Status   SARS Coronavirus 2 NEGATIVE NEGATIVE Final    Comment: (NOTE) SARS-CoV-2 target nucleic acids are NOT DETECTED.  The SARS-CoV-2 RNA is generally detectable in upper and lower respiratory  specimens during the acute phase of infection. Negative results do not preclude SARS-CoV-2 infection, do not rule out co-infections with other pathogens, and should not be used as the sole basis for treatment or other patient management decisions. Negative results must be combined with clinical observations, patient history, and epidemiological information. The expected result is Negative.  Fact Sheet for Patients: HairSlick.no  Fact Sheet for Healthcare Providers: quierodirigir.com  This test is not yet approved or cleared by the [place] FDA and  has been authorized for detection and/or diagnosis of SARS-CoV-2 by FDA under an Emergency Use Authorization (EUA). This EUA will remain  in effect (meaning this test can be used) for the duration of the COVID-19 declaration under Se ction 564(b)(1) of the Act, 21 U.S.C. section 360bbb-3(b)(1), unless the authorization is terminated or revoked sooner.  Performed at [facility], [address] [place], [place] [address]     Radiology Studies: No results found.    Gabrielle Nguyen Gabrielle Nguyen [facility]  If 7PM-7AM, please contact night-coverage [email or web address] 09/23/2020, 1:56 PM

## 2020-09-24 DIAGNOSIS — R52 Pain, unspecified: Secondary | ICD-10-CM

## 2020-09-24 DIAGNOSIS — D509 Iron deficiency anemia, unspecified: Secondary | ICD-10-CM

## 2020-09-24 DIAGNOSIS — F419 Anxiety disorder, unspecified: Secondary | ICD-10-CM

## 2020-09-24 DIAGNOSIS — F32A Depression, unspecified: Secondary | ICD-10-CM

## 2020-09-24 DIAGNOSIS — E876 Hypokalemia: Principal | ICD-10-CM

## 2020-09-24 LAB — RENAL FUNCTION PANEL
Albumin: 3.5 g/dL (ref 3.5–5.0)
Anion gap: 8 (ref 5–15)
BUN: 7 mg/dL (ref 6–20)
CO2: 24 mmol/L (ref 22–32)
Calcium: 8.8 mg/dL — ABNORMAL LOW (ref 8.9–10.3)
Chloride: 104 mmol/L (ref 98–111)
Creatinine, Ser: 0.59 mg/dL (ref 0.44–1.00)
GFR, Estimated: >60 mL/min (ref >60)
Glucose, Bld: 85 mg/dL (ref 70–99)
Phosphorus: 2.3 mg/dL — ABNORMAL LOW (ref 2.5–4.6)
Potassium: 3.6 mmol/L (ref 3.5–5.1)
Sodium: 136 mmol/L (ref 135–145)

## 2020-09-24 LAB — CBC
HCT: 30.3 % — ABNORMAL LOW (ref 36.0–46.0)
Hemoglobin: 9.7 g/dL — ABNORMAL LOW (ref 12.0–15.0)
MCH: 27.6 pg (ref 26.0–34.0)
MCHC: 32 g/dL (ref 30.0–36.0)
MCV: 86.3 fL (ref 80.0–100.0)
Platelets: 179 10*3/uL (ref 150–400)
RBC: 3.51 MIL/uL — ABNORMAL LOW (ref 3.87–5.11)
RDW: 15.8 % — ABNORMAL HIGH (ref 11.5–15.5)
WBC: 4.2 10*3/uL (ref 4.0–10.5)
nRBC: 0 % (ref 0.0–0.2)

## 2020-09-24 LAB — RETICULOCYTES
Immature Retic Fract: 13.3 % (ref 2.3–15.9)
RBC.: 3.5 MIL/uL — ABNORMAL LOW (ref 3.87–5.11)
Retic Count, Absolute: 68.3 10*3/uL (ref 19.0–186.0)
Retic Ct Pct: 2 % (ref 0.4–3.1)

## 2020-09-24 LAB — FERRITIN: Ferritin: 11 ng/mL (ref 11–307)

## 2020-09-24 LAB — IRON AND TIBC
Iron: 66 ug/dL (ref 28–170)
Saturation Ratios: 20 % (ref 10.4–31.8)
TIBC: 328 ug/dL (ref 250–450)
UIBC: 262 ug/dL

## 2020-09-24 LAB — MAGNESIUM: Magnesium: 2 mg/dL (ref 1.7–2.4)

## 2020-09-24 LAB — VITAMIN B12: Vitamin B-12: 207 pg/mL (ref 180–914)

## 2020-09-24 LAB — FOLATE: Folate: 9 ng/mL (ref >5.9)

## 2020-09-24 MED ORDER — POTASSIUM CHLORIDE CRYS ER 20 MEQ PO TBCR
40.0000 meq | EXTENDED_RELEASE_TABLET | Freq: Once | ORAL | Status: AC
  Administered 2020-09-24: 40 meq via ORAL
  Filled 2020-09-24: qty 2

## 2020-09-24 MED ORDER — METHADONE HCL 10 MG/ML PO CONC
130.0000 mg | Freq: Every day | ORAL | Status: DC
  Administered 2020-09-24: 130 mg via ORAL
  Filled 2020-09-24: qty 13

## 2020-09-24 NOTE — Discharge Instructions (Signed)
Outpatient Substance Use Treatment Services   [facility] Intensive Outpatient Program [address] [place], [place] [address]  [phone] Private insurance, Medicare A&B, and GCCN   [facility])  [address]  [place], [place] [address] [phone] Medicaid, Self Pay   [facility]      [address]  [place], [place] [phone] Medicaid and Private Insurance, Self Pay   [facility] [address]  [place], [place]  [phone] Private Insurance, and Self Pay  [facility]      [address]    [place], [place] [address]  [phone] or [phone] Private Insurance Only                 [facility] [address]  [place], [place] [address] [phone] Medicaid, Medicare, Private Insurance  [facility] [address]  [place], [place] [address] [phone] Services are free or reduced  [facility]  [address] [phone]  Self Pay only, sliding scale  [facility]  [address]  [place], [place] [address] [phone] ([facility]) Self Pay, Medicaid Only   [facility] [address]  [place], [place] [address] [phone] Medicaid, Medicare, Private Insurance                     Adolescent Substance Use Treatment Services    [facility] [address]  [place], [place]  [phone] Self Pay Offer scholarships from the [employer] to help pay for treatment  Website: www.theinsightprogram.com  [facility] use [facility] Males ages: 12-17 Adolescent Substance use Program Females: 12-17  [place] Office [address]  [place], [place] [address] (ph) [phone]  (fax) [phone]  [place]  Office  [address]  [place], [place] [address] (ph) [phone]  (fax) [phone]  [place] Office [address]  [place], [place] [address] (ph) [phone]  (fax) [phone]  [place] Office [address], [place], [place] [address] (ph) [phone]   (fax) [phone]  Website: https://youthhavenservices.com/         [facility] Substance Abuse Intensive Outpatient Program for Adolescents Phone: [phone] Address: [address], [place], [place] Website: [email or web address]    Residential Substance Use Treatment Services   [facility]  [address]  [place], [place] [address]  [phone] or [phone] Detox (Medicare, Medicaid, private insurance, and self pay)  Residential Rehab 14 days (Medicare, Medicaid, private insurance, and self pay)   [facility])  [address] [place], [place]  [phone]  Female and Female Detox (Self Pay and Medicaid limited availability)  Rehab only Female (Medicaid and self pay only)   [facility]      [address]  [place], [place] [address]  [phone] or [phone] Detox and Residential Treatment Private Insurance Only   [facility]  [address]  [place], [place] [address]  [phone]  Treatment Only, must make assessment appointment, and must be sober for assessment appointment.  Self Pay Only, Medicare A&B, [place] Medicaid, [place] ID only! *Transportation assistance offered from [employer] [address]  [facility]     [address] [place], [place] [address] Walk in interviews M-Sat 8-4p No   pending legal charges [phone]     [facility]:  [facility] Referral  [address] [place], [place] [phone] (Self Pay, Medicaid)  [facility] [address] [place], [place] [address] [phone] Detox and Residential Treatment Medicare and Private Insurance  [facility] [address]  [place], [place] [address] 28 Day Women's Facility: [phone] 28 Day Men's Facility: [phone] Long-term Residential Program:  [phone] Males 25 and Over (No Insurance, upfront fee)  [facility]  [address] [place], [place] [address] [phone] Private Insurance with [employer], Private Pay  [facility] [address] [place], [place] [address] Local [phone] Private Insurance Only  [facility] [address]  , [place] [address]  [phone] (Males, upfront fee)  [facility] [place] [address]  [place], [address] [phone] Private Insurance      [facility] Locations  [facility]  [address]  [place], [place]  [phone] Christian Based Program for individuals experiencing homelessness Self Pay, No insurance  [facility]  Men's program: [facility] [address]  [place], [place] [address] [phone]  [facility] Women's program: [facility] [address] [place], [place] [address] [phone] Christian Based Program for individuals experiencing homelessness Self Pay, No insurance  [facility] [address]  [place], [place] [address]  [phone] Christian Based Program for individuals experiencing homelessness Self Pay, No insurance  [facility] [address]  [place], [place] [address] [phone] Christian Based Program for individuals experiencing homelessness Self Pay, No insurance  [facility] [address] [place],  [phone] Christian Based Program for males experiencing homelessness Self Pay, No insurance 

## 2020-09-24 NOTE — TOC Initial Note (Signed)
Transition of Care [facility]) - Initial/Assessment Note    Patient Details  Name: Gabrielle Nguyen MRN: [id number] Date of Birth: Nov 20, 1995  Transition of Care (TOC) CM/SW Contact:    Armanda Heritage, RN Phone Number: 09/24/2020, 11:47 AM  Clinical Narrative:                 CM received consult for detox resources.  CM spoke with patient and provided her with information about outpatient vs inpatient treatment options and process.  Resources attached to patient's discharge AVS and patient advised that AVS will have a complete list of treatment facilities.   Expected Discharge Plan: Home/Self Care Barriers to Discharge: No Barriers Identified   Patient Goals and CMS Choice Patient states their goals for this hospitalization and ongoing recovery are:: to go home      Expected Discharge Plan and Services Expected Discharge Plan: Home/Self Care   Discharge Planning Services: CM Consult     Expected Discharge Date: 09/24/20                                    Prior Living Arrangements/Services     Patient language and need for interpreter reviewed:: Yes Do you feel safe going back to the place where you live?: Yes      Need for Family Participation in Patient Care: Yes (Comment) Care giver support system in place?: Yes (comment)   Criminal Activity/Legal Involvement Pertinent to Current Situation/Hospitalization: No - Comment as needed  Activities of Daily Living Home Assistive Devices/Equipment: None ADL Screening (condition at time of admission) Patient's cognitive ability adequate to safely complete daily activities?: Yes Is the patient deaf or have difficulty hearing?: No Does the patient have difficulty seeing, even when wearing glasses/contacts?: No Does the patient have difficulty concentrating, remembering, or making decisions?: No Patient able to express need for assistance with ADLs?: Yes Does the patient have difficulty dressing or bathing?:  No Independently performs ADLs?: Yes (appropriate for developmental age) Does the patient have difficulty walking or climbing stairs?: No Weakness of Legs: None Weakness of Arms/Hands: None  Permission Sought/Granted                  Emotional Assessment Appearance:: Appears stated age Attitude/Demeanor/Rapport: Engaged Affect (typically observed): Accepting Orientation: : Oriented to Self,Oriented to Place,Oriented to  Time,Oriented to Situation   Psych Involvement: No (comment)  Admission diagnosis:  Intractable nausea and vomiting [R11.2] Patient Active Problem List   Diagnosis Date Noted  . Nausea & vomiting 08/27/2020  . 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 Opioid withdrawal (HCC) 07/05/2020  . Intractable nausea and vomiting 07/04/2020  . Normal labor and delivery 11/26/2019  . Positive urine drug screen 11/26/2019  . No prenatal care in current pregnancy 11/26/2019  . Indication for care in labor or delivery 11/14/2015  . MDD (major depressive disorder) 08/19/2011  . Impulse control disorder 08/19/2011  . Panic disorder 08/19/2011   PCP:  Duard Brady, MD (Inactive) Pharmacy:   [phone] - [place], [place] - [address] AT [facility] OF [address] [place] [address] Phone: [phone] Fax: [phone]  [id number] [place], [place] - [address] AT [facility] OF [address] [place] [address] Phone: [phone] Fax: [phone]  [facility] - [place], [place] - [address]  [place] [address] Phone: [phone] Fax: [phone]     Social Determinants of Health (SDOH) Interventions    Readmission Risk Interventions No flowsheet data found.

## 2020-09-24 NOTE — Discharge Summary (Signed)
Physician Discharge Summary  [facility] DPO:[id number] DOB: 1996-08-13 DOA: 09/22/2020  PCP: Duard Brady, MD (Inactive)  Admit date: 09/22/2020 Discharge date: 09/24/2020  Admitted From: Home Disposition: Home  Recommendations for Outpatient Follow-up:  1. Follow ups as below. 2. Please obtain CBC/BMP/Mag at follow up 3. Please follow up on the following pending results: None  Home Health: None required Equipment/Devices: None needed  Discharge Condition: Stable CODE STATUS: Full code   Follow-up Information    Pudlo, Gennie Alma, MD. Schedule an appointment as soon as possible for a visit in 1 week(s).   Specialty: Pediatrics Contact information: [facility] [address] [place] [address] [phone]                Hospital Course: 25 year old female with PMH of opiate abuse now on methadone, anxiety and depression presenting with the above complaint for 1 day.  She is on methadone for opiate withdrawal.   Reportedly vomited a last dose the day prior to presentation and couldn't go to methadone clinic on the day of admission. CT abdomen and pelvis without significant finding other than slightly dilated common hepatic duct to 1 cm with CBD measuring 0.7 cm.  However, lipase and LFT within normal.  She was started on IV fluid, antiemetics and as needed morphine and admitted.  The next day, she was restarted on her home methadone at reduced dose.  Continued on IV fluid.  On the day of discharge, GI symptoms resolved.  Generalized pain improved.  Received full dose of methadone, 130 mg once, and discharged home to follow-up at methadone clinic in the morning. She was also provided with resources by TOC.  See individual problem list below for more hospital course.  Discharge Diagnoses:  Intractable nausea/vomiting/abdominal pain-likely opiate withdrawal.  Recurrent issue.  CT abdomen and pelvis with constipation, mild common  hepatic duct dilation to 1.0 cm and CBD to 0.7.  LFT and lipase within normal.  GI symptoms resolved.  Tolerated soft diet. -Received methadone 130 mg once on the day of discharge. -Patient to follow-up at methadone clinic tomorrow morning -TOC provided resources.  Generalized body pain-likely due to opiate withdrawal.  Improved. -Management as above  Hypokalemia: Resolved. -K-Dur 40 mEq x 1  Normocytic anemia:  Anemia panel suggests iron deficiency with slightly elevated TIBC and low ferritin.  She refused IV iron, and wanted to get multivitamin with iron.  Anxiety/depression: Stable.  Denies SI or HI. -Continue home medications    There is no height or weight on file to calculate BMI.            Discharge Exam: Vitals:   09/23/20 2146 09/24/20 0517  BP: 111/75 125/74  Pulse: 82 86  Resp: 18 18  Temp: 98 F (36.7 C) 98 F (36.7 C)  SpO2: 99% 97%    GENERAL: No apparent distress.  Nontoxic. HEENT: MMM.  Vision and hearing grossly intact.  NECK: Supple.  No apparent JVD.  RESP: On RA.  No IWOB.  Fair aeration bilaterally. CVS:  RRR. Heart sounds normal.  ABD/GI/GU: Bowel sounds present. Soft. Non tender.  MSK/EXT:  Moves extremities. No apparent deformity. No edema.  SKIN: no apparent skin lesion or wound NEURO: Awake, alert and oriented appropriately.  No apparent focal neuro deficit. PSYCH: Calm. Normal affect.   Discharge Instructions  Discharge Instructions    Call MD for:  difficulty breathing, headache or visual disturbances   Complete by: As directed  Call MD for:  extreme fatigue   Complete by: As directed    Call MD for:  persistant dizziness or light-headedness   Complete by: As directed    Call MD for:  persistant nausea and vomiting   Complete by: As directed    Call MD for:  temperature >100.4   Complete by: As directed    Diet general   Complete by: As directed    Discharge instructions   Complete by: As directed    It has been a  pleasure taking care of you!  You were hospitalized with nausea, vomiting, abdominal pain and generalized body pain likely due to withdrawal from methadone.  Your symptoms improved with treatment.  Please follow-up with your methadone clinic in the morning.   Take care,   Increase activity slowly   Complete by: As directed      Allergies as of 09/24/2020   No Known Allergies     Medication List    TAKE these medications   ALPRAZolam 1 MG tablet Commonly known as: XANAX Take 1 mg by mouth 2 (two) times daily as needed for anxiety.   APAP 325 MG tablet Take 2 tablets (650 mg total) by mouth every 6 (six) hours as needed for mild pain or headache.   loperamide 2 MG capsule Commonly known as: IMODIUM Take 1-2 capsules (2-4 mg total) by mouth as needed for diarrhea or loose stools (diarrhea).   methadone 10 MG/ML solution Commonly known as: DOLOPHINE Take 130 mg by mouth daily. Verified with on call personnel for [facility] [place]. Last dose was 08-23-20   ondansetron 4 MG disintegrating tablet Commonly known as: ZOFRAN-ODT Take 1 tablet (4 mg total) by mouth every 6 (six) hours as needed for nausea or vomiting.   prazosin 1 MG capsule Commonly known as: MINIPRESS Take 1 mg by mouth at bedtime.   sertraline 100 MG tablet Commonly known as: ZOLOFT Take 150 mg by mouth daily.       Consultations:  None  Procedures/Studies   CT ABDOMEN PELVIS W CONTRAST  Result Date: 09/22/2020 CLINICAL DATA:  Right lower quadrant abdominal pain with nausea and vomiting. EXAM: CT ABDOMEN AND PELVIS WITH CONTRAST TECHNIQUE: Multidetector CT imaging of the abdomen and pelvis was performed using the standard protocol following bolus administration of intravenous contrast. CONTRAST:  OMNIPAQUE IOHEXOL 300 MG/ML  SOLN COMPARISON:  Abdominal ultrasound 08/27/2020 FINDINGS: Lower chest: Unremarkable Hepatobiliary: 0.3 cm hypodense lesion in the dome of the  right hepatic lobe on image 10 of series 2 is technically too small to characterize although statistically likely to be benign. Gallbladder unremarkable. Mild focal steatosis along the falciform ligament. The common hepatic duct measures 1.0 cm in diameter on image 39 of series 5, with the common bile duct measuring up to 0.7 cm, both are somewhat prominent, and also increased from 08/27/2020. No intrahepatic biliary dilatation. Pancreas: Unremarkable Spleen: Unremarkable Adrenals/Urinary Tract: Unremarkable Stomach/Bowel: Prominent stool throughout the colon favors constipation. Normal appendix. Vascular/Lymphatic: Unremarkable Reproductive: Unremarkable Other: No supplemental non-categorized findings. Musculoskeletal: Unremarkable IMPRESSION: 1. Prominent stool throughout the colon favors constipation. 2. The common hepatic duct measures up to 1.0 cm in diameter, with the common bile duct measuring up to 0.7 cm in diameter, both increased from 08/27/2020. No intrahepatic biliary dilatation. Correlate with liver function tests. If abnormal, consider MRCP for further characterization. 3. Normal appendix. Electronically Signed   By: Gaylyn Rong M.D.   On: 09/22/2020 12:03   DG  Chest Portable 1 View  Result Date: 08/26/2020 CLINICAL DATA:  Withdrawal symptoms with weakness EXAM: PORTABLE CHEST 1 VIEW COMPARISON:  09/30/2014 FINDINGS: The heart size and mediastinal contours are within normal limits. Both lungs are clear. The visualized skeletal structures are unremarkable. IMPRESSION: No active disease. Electronically Signed   By: Alcide Clever M.D.   On: 08/26/2020 10:33   US Abdomen Limited RUQ (LIVER/GB)  Result Date: 08/27/2020 CLINICAL DATA:  Elevated LFTs. EXAM: ULTRASOUND ABDOMEN LIMITED RIGHT UPPER QUADRANT COMPARISON:  None. FINDINGS: Gallbladder: No gallstones or wall thickening visualized. No sonographic Murphy sign noted by sonographer. Common bile duct: Diameter: 2.2 mm. Liver: Focal  echogenic region along the inter lobar fissure measuring 3.2 cm likely focal fatty infiltration. Portal vein is patent on color Doppler imaging with normal direction of blood flow towards the liver. Other: None. IMPRESSION: No acute hepatobiliary findings. Electronically Signed   By: Elberta Fortis M.D.   On: 08/27/2020 13:08        The results of significant diagnostics from this hospitalization (including imaging, microbiology, ancillary and laboratory) are listed below for reference.     Microbiology: Recent Results (from the past 240 hour(s))  SARS CORONAVIRUS 2 (TAT 6-24 HRS) Nasopharyngeal Nasopharyngeal Swab     Status: None   Collection Time: 09/22/20  5:47 AM   Specimen: Nasopharyngeal Swab  Result Value Ref Range Status   SARS Coronavirus 2 NEGATIVE NEGATIVE Final    Comment: (NOTE) SARS-CoV-2 target nucleic acids are NOT DETECTED.  The SARS-CoV-2 RNA is generally detectable in upper and lower respiratory specimens during the acute phase of infection. Negative results do not preclude SARS-CoV-2 infection, do not rule out co-infections with other pathogens, and should not be used as the sole basis for treatment or other patient management decisions. Negative results must be combined with clinical observations, patient history, and epidemiological information. The expected result is Negative.  Fact Sheet for Patients: HairSlick.no  Fact Sheet for Healthcare Providers: quierodirigir.com  This test is not yet approved or cleared by the [place] FDA and  has been authorized for detection and/or diagnosis of SARS-CoV-2 by FDA under an Emergency Use Authorization (EUA). This EUA will remain  in effect (meaning this test can be used) for the duration of the COVID-19 declaration under Se ction 564(b)(1) of the Act, 21 U.S.C. section 360bbb-3(b)(1), unless the authorization is terminated or revoked sooner.  Performed  at [facility], [address], [place], [place] [address]      Labs:  CBC: Recent Labs  Lab 09/21/20 1814 09/23/20 0036 09/24/20 0514  WBC 8.7 5.7 4.2  HGB 12.1 9.5* 9.7*  HCT 37.1 30.4* 30.3*  MCV 85.1 88.4 86.3  PLT 334 241 179   BMP &GFR Recent Labs  Lab 09/21/20 1814 09/22/20 0547 09/23/20 0036 09/24/20 0514  NA 141  --  140 136  K 3.2*  --  3.5 3.6  CL 101  --  108 104  CO2 24  --  20* 24  GLUCOSE 119*  --  87 85  BUN 16  --  9 7  CREATININE 0.79  --  0.61 0.59  CALCIUM 9.8  --  8.6* 8.8*  MG  --  2.2  --  2.0  PHOS  --   --   --  2.3*   CrCl cannot be calculated (Unknown ideal weight.). Liver & Pancreas: Recent Labs  Lab 09/21/20 1814 09/23/20 0036 09/24/20 0514  AST 19 15  --  ALT 12 8  --   ALKPHOS 137* 74  --   BILITOT 0.4 0.8  --   PROT 8.8* 6.7  --   ALBUMIN 4.5 3.6 3.5   Recent Labs  Lab 09/21/20 1814  LIPASE 25   No results for input(s): AMMONIA in the last 168 hours. Diabetic: No results for input(s): HGBA1C in the last 72 hours. No results for input(s): GLUCAP in the last 168 hours. Cardiac Enzymes: No results for input(s): CKTOTAL, CKMB, CKMBINDEX, TROPONINI in the last 168 hours. No results for input(s): PROBNP in the last 8760 hours. Coagulation Profile: No results for input(s): INR, PROTIME in the last 168 hours. Thyroid Function Tests: No results for input(s): TSH, T4TOTAL, FREET4, T3FREE, THYROIDAB in the last 72 hours. Lipid Profile: No results for input(s): CHOL, HDL, LDLCALC, TRIG, CHOLHDL, LDLDIRECT in the last 72 hours. Anemia Panel: Recent Labs    09/24/20 0508 09/24/20 0514  FOLATE  --  9.0  RETICCTPCT 2.0  --    Urine analysis:    Component Value Date/Time   COLORURINE YELLOW 08/26/2020 1005   APPEARANCEUR CLEAR 08/26/2020 1005   LABSPEC 1.021 08/26/2020 1005   PHURINE 6.0 08/26/2020 1005   GLUCOSEU NEGATIVE 08/26/2020 1005   HGBUR NEGATIVE 08/26/2020 1005   BILIRUBINUR NEGATIVE 08/26/2020 1005    KETONESUR 20 (A) 08/26/2020 1005   PROTEINUR NEGATIVE 08/26/2020 1005   UROBILINOGEN 0.2 07/11/2015 2112   NITRITE NEGATIVE 08/26/2020 1005   LEUKOCYTESUR TRACE (A) 08/26/2020 1005   Sepsis Labs: Invalid input(s): PROCALCITONIN, LACTICIDVEN   Time coordinating discharge: 35 minutes  SIGNED:  Almon Hercules, MD  [facility] 09/24/2020, 10:39 AM  If 7PM-7AM, please contact night-coverage [email or web address]

## 2020-09-30 ENCOUNTER — Other Ambulatory Visit: Payer: Self-pay

## 2020-09-30 ENCOUNTER — Encounter (HOSPITAL_COMMUNITY): Payer: Self-pay | Admitting: Emergency Medicine

## 2020-09-30 ENCOUNTER — Inpatient Hospital Stay (HOSPITAL_COMMUNITY)
Admission: EM | Admit: 2020-09-30 | Discharge: 2020-10-05 | DRG: 896 | Disposition: A | Discharge status: Home / Self Care | Payer: Medicaid Other | Attending: Critical Care Medicine | Admitting: Critical Care Medicine

## 2020-09-30 DIAGNOSIS — J9601 Acute respiratory failure with hypoxia: Secondary | ICD-10-CM | POA: Diagnosis present

## 2020-09-30 DIAGNOSIS — K117 Disturbances of salivary secretion: Secondary | ICD-10-CM | POA: Diagnosis present

## 2020-09-30 DIAGNOSIS — F418 Other specified anxiety disorders: Secondary | ICD-10-CM | POA: Diagnosis present

## 2020-09-30 DIAGNOSIS — Z8249 Family history of ischemic heart disease and other diseases of the circulatory system: Secondary | ICD-10-CM

## 2020-09-30 DIAGNOSIS — R112 Nausea with vomiting, unspecified: Secondary | ICD-10-CM | POA: Diagnosis present

## 2020-09-30 DIAGNOSIS — Z20822 Contact with and (suspected) exposure to covid-19: Secondary | ICD-10-CM | POA: Diagnosis present

## 2020-09-30 DIAGNOSIS — R0602 Shortness of breath: Secondary | ICD-10-CM

## 2020-09-30 DIAGNOSIS — I10 Essential (primary) hypertension: Secondary | ICD-10-CM | POA: Diagnosis present

## 2020-09-30 DIAGNOSIS — Z811 Family history of alcohol abuse and dependence: Secondary | ICD-10-CM

## 2020-09-30 DIAGNOSIS — E876 Hypokalemia: Secondary | ICD-10-CM | POA: Diagnosis present

## 2020-09-30 DIAGNOSIS — F1193 Opioid use, unspecified with withdrawal: Secondary | ICD-10-CM | POA: Diagnosis present

## 2020-09-30 DIAGNOSIS — J9602 Acute respiratory failure with hypercapnia: Secondary | ICD-10-CM | POA: Diagnosis present

## 2020-09-30 DIAGNOSIS — R1110 Vomiting, unspecified: Secondary | ICD-10-CM

## 2020-09-30 DIAGNOSIS — R739 Hyperglycemia, unspecified: Secondary | ICD-10-CM | POA: Diagnosis not present

## 2020-09-30 DIAGNOSIS — Z87891 Personal history of nicotine dependence: Secondary | ICD-10-CM

## 2020-09-30 DIAGNOSIS — F913 Oppositional defiant disorder: Secondary | ICD-10-CM | POA: Diagnosis present

## 2020-09-30 DIAGNOSIS — H5704 Mydriasis: Secondary | ICD-10-CM | POA: Diagnosis present

## 2020-09-30 DIAGNOSIS — K219 Gastro-esophageal reflux disease without esophagitis: Secondary | ICD-10-CM | POA: Diagnosis present

## 2020-09-30 DIAGNOSIS — D649 Anemia, unspecified: Secondary | ICD-10-CM | POA: Diagnosis present

## 2020-09-30 DIAGNOSIS — J9600 Acute respiratory failure, unspecified whether with hypoxia or hypercapnia: Secondary | ICD-10-CM

## 2020-09-30 DIAGNOSIS — Z818 Family history of other mental and behavioral disorders: Secondary | ICD-10-CM

## 2020-09-30 DIAGNOSIS — F1123 Opioid dependence with withdrawal: Secondary | ICD-10-CM | POA: Diagnosis present

## 2020-09-30 DIAGNOSIS — R0902 Hypoxemia: Secondary | ICD-10-CM

## 2020-09-30 DIAGNOSIS — F410 Panic disorder [episodic paroxysmal anxiety] without agoraphobia: Secondary | ICD-10-CM | POA: Diagnosis present

## 2020-09-30 DIAGNOSIS — G9341 Metabolic encephalopathy: Secondary | ICD-10-CM | POA: Diagnosis present

## 2020-09-30 DIAGNOSIS — J45909 Unspecified asthma, uncomplicated: Secondary | ICD-10-CM | POA: Diagnosis present

## 2020-09-30 DIAGNOSIS — Z813 Family history of other psychoactive substance abuse and dependence: Secondary | ICD-10-CM

## 2020-09-30 LAB — COMPREHENSIVE METABOLIC PANEL
ALT: 13 U/L (ref 0–44)
AST: 25 U/L (ref 15–41)
Albumin: 4.6 g/dL (ref 3.5–5.0)
Alkaline Phosphatase: 64 U/L (ref 38–126)
Anion gap: 13 (ref 5–15)
BUN: 11 mg/dL (ref 6–20)
CO2: 24 mmol/L (ref 22–32)
Calcium: 9.7 mg/dL (ref 8.9–10.3)
Chloride: 101 mmol/L (ref 98–111)
Creatinine, Ser: 0.71 mg/dL (ref 0.44–1.00)
GFR, Estimated: >60 mL/min (ref >60)
Glucose, Bld: 117 mg/dL — ABNORMAL HIGH (ref 70–99)
Potassium: 3.6 mmol/L (ref 3.5–5.1)
Sodium: 138 mmol/L (ref 135–145)
Total Bilirubin: 0.3 mg/dL (ref 0.3–1.2)
Total Protein: 8.5 g/dL — ABNORMAL HIGH (ref 6.5–8.1)

## 2020-09-30 LAB — SARS CORONAVIRUS 2 BY RT PCR (HOSPITAL ORDER, PERFORMED IN ~~LOC~~ HOSPITAL LAB): SARS Coronavirus 2: NEGATIVE

## 2020-09-30 LAB — CBC
HCT: 36 % (ref 36.0–46.0)
Hemoglobin: 11.5 g/dL — ABNORMAL LOW (ref 12.0–15.0)
MCH: 27.3 pg (ref 26.0–34.0)
MCHC: 31.9 g/dL (ref 30.0–36.0)
MCV: 85.5 fL (ref 80.0–100.0)
Platelets: 243 10*3/uL (ref 150–400)
RBC: 4.21 MIL/uL (ref 3.87–5.11)
RDW: 15.9 % — ABNORMAL HIGH (ref 11.5–15.5)
WBC: 9.4 10*3/uL (ref 4.0–10.5)
nRBC: 0 % (ref 0.0–0.2)

## 2020-09-30 LAB — I-STAT BETA HCG BLOOD, ED (MC, WL, AP ONLY): I-stat hCG, quantitative: 7 m[IU]/mL — ABNORMAL HIGH (ref <5)

## 2020-09-30 LAB — MAGNESIUM: Magnesium: 1.9 mg/dL (ref 1.7–2.4)

## 2020-09-30 LAB — LIPASE, BLOOD: Lipase: 25 U/L (ref 11–51)

## 2020-09-30 MED ORDER — ONDANSETRON 4 MG PO TBDP
4.0000 mg | ORAL_TABLET | Freq: Once | ORAL | Status: AC | PRN
  Administered 2020-09-30: 4 mg via ORAL
  Filled 2020-09-30: qty 1

## 2020-09-30 MED ORDER — LABETALOL HCL 5 MG/ML IV SOLN
20.0000 mg | INTRAVENOUS | Status: DC | PRN
  Administered 2020-09-30 – 2020-10-03 (×8): 20 mg via INTRAVENOUS
  Filled 2020-09-30 (×8): qty 4

## 2020-09-30 MED ORDER — SERTRALINE HCL 50 MG PO TABS
150.0000 mg | ORAL_TABLET | Freq: Every day | ORAL | Status: DC
  Administered 2020-10-01: 150 mg via ORAL
  Filled 2020-09-30: qty 3
  Filled 2020-09-30: qty 1

## 2020-09-30 MED ORDER — ENOXAPARIN SODIUM 40 MG/0.4ML ~~LOC~~ SOLN
40.0000 mg | SUBCUTANEOUS | Status: DC
  Administered 2020-10-01 – 2020-10-05 (×5): 40 mg via SUBCUTANEOUS
  Filled 2020-09-30 (×5): qty 0.4

## 2020-09-30 MED ORDER — POTASSIUM CHLORIDE IN NACL 20-0.9 MEQ/L-% IV SOLN
INTRAVENOUS | Status: DC
  Filled 2020-09-30 (×4): qty 1000

## 2020-09-30 MED ORDER — PROCHLORPERAZINE EDISYLATE 10 MG/2ML IJ SOLN
5.0000 mg | INTRAMUSCULAR | Status: DC | PRN
  Administered 2020-09-30 – 2020-10-03 (×7): 5 mg via INTRAVENOUS
  Filled 2020-09-30 (×7): qty 2

## 2020-09-30 MED ORDER — METHOCARBAMOL 500 MG PO TABS: 500.0000 mg | ORAL_TABLET | Freq: Three times a day (TID) | ORAL | Status: DC | PRN

## 2020-09-30 MED ORDER — HYDROMORPHONE HCL 2 MG/ML IJ SOLN
2.0000 mg | Freq: Once | INTRAMUSCULAR | Status: AC
Start: 2020-09-30 — End: 2020-09-30
  Administered 2020-09-30: 2 mg via INTRAVENOUS
  Filled 2020-09-30: qty 1

## 2020-09-30 MED ORDER — SODIUM CHLORIDE 0.9 % IV BOLUS
1000.0000 mL | Freq: Once | INTRAVENOUS | Status: AC
  Administered 2020-09-30: 1000 mL via INTRAVENOUS

## 2020-09-30 MED ORDER — ACETAMINOPHEN 650 MG RE SUPP: 650.0000 mg | Freq: Four times a day (QID) | RECTAL | Status: DC | PRN

## 2020-09-30 MED ORDER — PRAZOSIN HCL 1 MG PO CAPS
1.0000 mg | ORAL_CAPSULE | Freq: Every day | ORAL | Status: DC
  Administered 2020-10-02: 1 mg via ORAL
  Filled 2020-09-30 (×3): qty 1

## 2020-09-30 MED ORDER — PROMETHAZINE HCL 25 MG/ML IJ SOLN
12.5000 mg | Freq: Once | INTRAMUSCULAR | Status: AC
  Administered 2020-09-30: 12.5 mg via INTRAVENOUS
  Filled 2020-09-30: qty 1

## 2020-09-30 MED ORDER — CLONIDINE HCL 0.1 MG/24HR TD PTWK
0.1000 mg | MEDICATED_PATCH | TRANSDERMAL | Status: DC
  Administered 2020-09-30: 0.1 mg via TRANSDERMAL
  Filled 2020-09-30: qty 1

## 2020-09-30 MED ORDER — LOPERAMIDE HCL 2 MG PO CAPS: 2.0000 mg | ORAL_CAPSULE | ORAL | Status: DC | PRN

## 2020-09-30 MED ORDER — ONDANSETRON HCL 4 MG/2ML IJ SOLN
4.0000 mg | Freq: Once | INTRAMUSCULAR | Status: AC
  Administered 2020-09-30: 4 mg via INTRAVENOUS
  Filled 2020-09-30: qty 2

## 2020-09-30 MED ORDER — HYDROXYZINE HCL 25 MG PO TABS
25.0000 mg | ORAL_TABLET | Freq: Four times a day (QID) | ORAL | Status: DC | PRN
  Administered 2020-10-01 – 2020-10-05 (×5): 25 mg via ORAL
  Filled 2020-09-30 (×6): qty 1

## 2020-09-30 MED ORDER — PANTOPRAZOLE SODIUM 40 MG IV SOLR
40.0000 mg | INTRAVENOUS | Status: DC
  Administered 2020-09-30 – 2020-10-02 (×3): 40 mg via INTRAVENOUS
  Filled 2020-09-30 (×3): qty 40

## 2020-09-30 MED ORDER — LORAZEPAM 2 MG/ML IJ SOLN
1.0000 mg | Freq: Once | INTRAMUSCULAR | Status: AC
  Administered 2020-09-30: 1 mg via INTRAVENOUS
  Filled 2020-09-30: qty 1

## 2020-09-30 MED ORDER — CLONIDINE HCL 0.1 MG PO TABS: 0.1000 mg | ORAL_TABLET | Freq: Once | ORAL | Status: DC

## 2020-09-30 MED ORDER — METOPROLOL TARTRATE 5 MG/5ML IV SOLN
5.0000 mg | Freq: Once | INTRAVENOUS | Status: AC
  Administered 2020-09-30: 5 mg via INTRAVENOUS
  Filled 2020-09-30: qty 5

## 2020-09-30 MED ORDER — ACETAMINOPHEN 325 MG PO TABS
650.0000 mg | ORAL_TABLET | Freq: Four times a day (QID) | ORAL | Status: DC | PRN
  Administered 2020-10-03 – 2020-10-05 (×2): 650 mg via ORAL
  Filled 2020-09-30 (×3): qty 2

## 2020-09-30 NOTE — ED Triage Notes (Signed)
Pt presents via EMS after having vomiting since this morning. Hx of same. Alert and oriented. EMS CBG 144.
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
## 2020-09-30 NOTE — ED Triage Notes (Signed)
Pt generally uncooperative with answering questions. Did not answer LMP. Refused zofran protocol as she has taken at home and it has not worked. Pt provided with clean emesis bag.

## 2020-09-30 NOTE — ED Notes (Signed)
Patient is speaking when questions are asked. She is just sitting there shivering and starring out in space.
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
## 2020-10-01 DIAGNOSIS — J45909 Unspecified asthma, uncomplicated: Secondary | ICD-10-CM | POA: Diagnosis present

## 2020-10-01 DIAGNOSIS — K117 Disturbances of salivary secretion: Secondary | ICD-10-CM | POA: Diagnosis present

## 2020-10-01 DIAGNOSIS — Z818 Family history of other mental and behavioral disorders: Secondary | ICD-10-CM | POA: Diagnosis not present

## 2020-10-01 DIAGNOSIS — F418 Other specified anxiety disorders: Secondary | ICD-10-CM | POA: Diagnosis present

## 2020-10-01 DIAGNOSIS — J9602 Acute respiratory failure with hypercapnia: Secondary | ICD-10-CM | POA: Diagnosis present

## 2020-10-01 DIAGNOSIS — F1123 Opioid dependence with withdrawal: Secondary | ICD-10-CM | POA: Diagnosis present

## 2020-10-01 DIAGNOSIS — F410 Panic disorder [episodic paroxysmal anxiety] without agoraphobia: Secondary | ICD-10-CM | POA: Diagnosis present

## 2020-10-01 DIAGNOSIS — R4182 Altered mental status, unspecified: Secondary | ICD-10-CM | POA: Diagnosis not present

## 2020-10-01 DIAGNOSIS — F913 Oppositional defiant disorder: Secondary | ICD-10-CM | POA: Diagnosis present

## 2020-10-01 DIAGNOSIS — Z811 Family history of alcohol abuse and dependence: Secondary | ICD-10-CM | POA: Diagnosis not present

## 2020-10-01 DIAGNOSIS — E876 Hypokalemia: Secondary | ICD-10-CM | POA: Diagnosis present

## 2020-10-01 DIAGNOSIS — Z87891 Personal history of nicotine dependence: Secondary | ICD-10-CM | POA: Diagnosis not present

## 2020-10-01 DIAGNOSIS — R739 Hyperglycemia, unspecified: Secondary | ICD-10-CM | POA: Diagnosis not present

## 2020-10-01 DIAGNOSIS — Z8249 Family history of ischemic heart disease and other diseases of the circulatory system: Secondary | ICD-10-CM | POA: Diagnosis not present

## 2020-10-01 DIAGNOSIS — Z813 Family history of other psychoactive substance abuse and dependence: Secondary | ICD-10-CM | POA: Diagnosis not present

## 2020-10-01 DIAGNOSIS — R112 Nausea with vomiting, unspecified: Secondary | ICD-10-CM | POA: Diagnosis not present

## 2020-10-01 DIAGNOSIS — K219 Gastro-esophageal reflux disease without esophagitis: Secondary | ICD-10-CM | POA: Diagnosis present

## 2020-10-01 DIAGNOSIS — H5704 Mydriasis: Secondary | ICD-10-CM | POA: Diagnosis present

## 2020-10-01 DIAGNOSIS — F1920 Other psychoactive substance dependence, uncomplicated: Secondary | ICD-10-CM | POA: Diagnosis not present

## 2020-10-01 DIAGNOSIS — Z20822 Contact with and (suspected) exposure to covid-19: Secondary | ICD-10-CM | POA: Diagnosis present

## 2020-10-01 DIAGNOSIS — F1120 Opioid dependence, uncomplicated: Secondary | ICD-10-CM | POA: Diagnosis not present

## 2020-10-01 DIAGNOSIS — G9341 Metabolic encephalopathy: Secondary | ICD-10-CM | POA: Diagnosis present

## 2020-10-01 DIAGNOSIS — D649 Anemia, unspecified: Secondary | ICD-10-CM | POA: Diagnosis present

## 2020-10-01 DIAGNOSIS — I10 Essential (primary) hypertension: Secondary | ICD-10-CM | POA: Diagnosis present

## 2020-10-01 DIAGNOSIS — J9601 Acute respiratory failure with hypoxia: Secondary | ICD-10-CM | POA: Diagnosis present

## 2020-10-01 LAB — RAPID URINE DRUG SCREEN, HOSP PERFORMED
Amphetamines: NOT DETECTED
Barbiturates: NOT DETECTED
Benzodiazepines: POSITIVE — AB
Cocaine: NOT DETECTED
Opiates: POSITIVE — AB
Tetrahydrocannabinol: NOT DETECTED

## 2020-10-01 LAB — BASIC METABOLIC PANEL
Anion gap: 10 (ref 5–15)
BUN: 6 mg/dL (ref 6–20)
CO2: 22 mmol/L (ref 22–32)
Calcium: 8.9 mg/dL (ref 8.9–10.3)
Chloride: 103 mmol/L (ref 98–111)
Creatinine, Ser: 0.56 mg/dL (ref 0.44–1.00)
GFR, Estimated: >60 mL/min (ref >60)
Glucose, Bld: 114 mg/dL — ABNORMAL HIGH (ref 70–99)
Potassium: 3.4 mmol/L — ABNORMAL LOW (ref 3.5–5.1)
Sodium: 135 mmol/L (ref 135–145)

## 2020-10-01 LAB — URINALYSIS, ROUTINE W REFLEX MICROSCOPIC
Bacteria, UA: NONE SEEN
Bilirubin Urine: NEGATIVE
Glucose, UA: NEGATIVE mg/dL
Hgb urine dipstick: NEGATIVE
Ketones, ur: 20 mg/dL — AB
Nitrite: NEGATIVE
Protein, ur: NEGATIVE mg/dL
Specific Gravity, Urine: 1.015 (ref 1.005–1.030)
pH: 6 (ref 5.0–8.0)

## 2020-10-01 LAB — CBC
HCT: 30.2 % — ABNORMAL LOW (ref 36.0–46.0)
Hemoglobin: 9.7 g/dL — ABNORMAL LOW (ref 12.0–15.0)
MCH: 27.6 pg (ref 26.0–34.0)
MCHC: 32.1 g/dL (ref 30.0–36.0)
MCV: 85.8 fL (ref 80.0–100.0)
Platelets: 226 10*3/uL (ref 150–400)
RBC: 3.52 MIL/uL — ABNORMAL LOW (ref 3.87–5.11)
RDW: 16.2 % — ABNORMAL HIGH (ref 11.5–15.5)
WBC: 9.1 10*3/uL (ref 4.0–10.5)
nRBC: 0 % (ref 0.0–0.2)

## 2020-10-01 MED ORDER — MORPHINE SULFATE (PF) 2 MG/ML IV SOLN
1.0000 mg | INTRAVENOUS | Status: DC | PRN
  Administered 2020-10-01: 1 mg via INTRAVENOUS
  Filled 2020-10-01: qty 1

## 2020-10-01 MED ORDER — HYDROMORPHONE HCL 1 MG/ML IJ SOLN
1.0000 mg | INTRAMUSCULAR | Status: DC | PRN
  Administered 2020-10-01 – 2020-10-02 (×5): 1 mg via INTRAVENOUS
  Filled 2020-10-01 (×5): qty 1

## 2020-10-01 MED ORDER — HYDROMORPHONE HCL 1 MG/ML IJ SOLN
1.0000 mg | Freq: Once | INTRAMUSCULAR | Status: AC
  Administered 2020-10-01: 1 mg via INTRAVENOUS
  Filled 2020-10-01: qty 1

## 2020-10-01 MED ORDER — KETOROLAC TROMETHAMINE 15 MG/ML IJ SOLN
15.0000 mg | Freq: Four times a day (QID) | INTRAMUSCULAR | Status: DC | PRN
  Administered 2020-10-01 – 2020-10-05 (×6): 15 mg via INTRAVENOUS
  Filled 2020-10-01 (×7): qty 1

## 2020-10-01 MED ORDER — ALPRAZOLAM 1 MG PO TABS
1.0000 mg | ORAL_TABLET | Freq: Two times a day (BID) | ORAL | Status: DC | PRN
  Administered 2020-10-01: 1 mg via ORAL
  Filled 2020-10-01 (×2): qty 1

## 2020-10-01 MED ORDER — METHADONE HCL 10 MG PO TABS
80.0000 mg | ORAL_TABLET | Freq: Every day | ORAL | Status: DC
  Administered 2020-10-01: 80 mg via ORAL
  Filled 2020-10-01 (×2): qty 8

## 2020-10-01 NOTE — ED Notes (Signed)
This RN went to check on patient and found her out of bed, squatting down on the floor urinating. Patient helped back into bed. EVS called to mop floor.

## 2020-10-01 NOTE — ED Notes (Signed)
Per RN on 4E, this patient cannot come upstairs till after shift change. Per 4E RN, the [facility] is aware. Alexia Freestone, ED Charge RN aware.

## 2020-10-01 NOTE — ED Notes (Signed)
Report given to charge RN, will transport patient when RN for the assignment arrives

## 2020-10-01 NOTE — ED Notes (Signed)
Floor nurse called, they are waiting for a nurse to come help, she should be in shortly and they will call for report at that time.
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
## 2020-10-01 NOTE — Progress Notes (Signed)
PROGRESS NOTE    Gabrielle Nguyen  GGY:[id number] DOB: 1996-08-16 DOA: 09/30/2020 PCP: Duard Brady, MD (Inactive)     Brief Narrative:  Gabrielle Nguyen is a 25 y.o. 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  All other symptoms, body aches, chills, tremors and palpitations.  She states she has not run out of methadone.  She took a dose earlier, but had emesis afterwards.    New events last 24 hours / Subjective: Patient not feeling well, sitting in the emergency department.  She states that she has not been able to keep down her methadone for about 3 days.  She confirms that she takes 130 mg daily at baseline.  She was recently hospitalized for similar issues and was discharged on 1/23.  Assessment & Plan:   Principal Problem:   Intractable nausea and vomiting Active Problems:   Methadone withdrawal (HCC)   Asthma   Anxiety with depression   Normocytic anemia   Intractable nausea, vomiting, abdominal pain secondary to methadone withdrawal -Resume methadone at a lower dose, titrate up to her home dose of 130 mg daily -Continue clonidine, hydroxyzine, loperamide, Robaxin, antiemetic, morphine PRN   History of asthma -Without acute exacerbation at this time  Anxiety with depression -Continue Zoloft, xanax   Hypokalemia -Replace   DVT prophylaxis:  enoxaparin (LOVENOX) injection 40 mg Start: 10/01/20 1000  Code Status: Full code Family Communication: No family at bedside Disposition Plan:  Status is: Observation  The patient will require care spanning > 2 midnights and should be moved to inpatient because: Hemodynamically unstable and IV treatments appropriate due to intensity of illness or inability to take  PO  Dispo: The patient is from: Home              Anticipated d/c is to: Home              Anticipated d/c date is: 1 day              Patient currently is not medically stable to d/c.  Remains hemodynamically unstable with tachycardia, patient with severe tremors.  Not tolerating p.o.   Difficult to place patient No     Antimicrobials:  Anti-infectives (From admission, onward)   None        Objective: Vitals:   10/01/20 1045 10/01/20 1130 10/01/20 1215 10/01/20 1245  BP: 139/84 133/90 121/86 (!) 166/92  Pulse: 94 88 91 (!) 108  Resp: 13 18 16 18   Temp:      TempSrc:      SpO2: 98% 100% 100% 97%    Intake/Output Summary (Last 24 hours) at 10/01/2020 1301 Last data filed at 09/30/2020 2314 Gross per 24 hour  Intake 2000 ml  Output --  Net 2000 ml   There were no vitals filed for this visit.  Examination:  General exam: Appears tremorous, uncomfortable  Respiratory system: Clear to auscultation. Respiratory effort normal. No respiratory distress. No conversational dyspnea.  Cardiovascular system: S1 & S2 heard, tachycardic regular rhythm. No murmurs. No pedal edema. Gastrointestinal system: Abdomen is nondistended, soft  Central nervous system: Alert and oriented. No focal neurological deficits. Extremities: Symmetric in appearance  Skin: No rashes, lesions or ulcers on exposed skin   Data Reviewed: I have personally reviewed following labs and imaging studies  CBC:  Recent Labs  Lab 09/30/20 1410 10/01/20 0500  WBC 9.4 9.1  HGB 11.5* 9.7*  HCT 36.0 30.2*  MCV 85.5 85.8  PLT 243 226   Basic Metabolic Panel: Recent Labs  Lab 09/30/20 1410 09/30/20 1730 10/01/20 0500  NA 138  --  135  K 3.6  --  3.4*  CL 101  --  103  CO2 24  --  22  GLUCOSE 117*  --  114*  BUN 11  --  6  CREATININE 0.71  --  0.56  CALCIUM 9.7  --  8.9  MG  --  1.9  --    GFR: CrCl cannot be calculated (Unknown ideal weight.). Liver Function Tests: Recent Labs  Lab  09/30/20 1410  AST 25  ALT 13  ALKPHOS 64  BILITOT 0.3  PROT 8.5*  ALBUMIN 4.6   Recent Labs  Lab 09/30/20 1410  LIPASE 25   No results for input(s): AMMONIA in the last 168 hours. Coagulation Profile: No results for input(s): INR, PROTIME in the last 168 hours. Cardiac Enzymes: No results for input(s): CKTOTAL, CKMB, CKMBINDEX, TROPONINI in the last 168 hours. BNP (last 3 results) No results for input(s): PROBNP in the last 8760 hours. HbA1C: No results for input(s): HGBA1C in the last 72 hours. CBG: No results for input(s): GLUCAP in the last 168 hours. Lipid Profile: No results for input(s): CHOL, HDL, LDLCALC, TRIG, CHOLHDL, LDLDIRECT in the last 72 hours. Thyroid Function Tests: No results for input(s): TSH, T4TOTAL, FREET4, T3FREE, THYROIDAB in the last 72 hours. Anemia Panel: No results for input(s): VITAMINB12, FOLATE, FERRITIN, TIBC, IRON, RETICCTPCT in the last 72 hours. Sepsis Labs: No results for input(s): PROCALCITON, LATICACIDVEN in the last 168 hours.  Recent Results (from the past 240 hour(s))  SARS CORONAVIRUS 2 (TAT 6-24 HRS) Nasopharyngeal Nasopharyngeal Swab     Status: None   Collection Time: 09/22/20  5:47 AM   Specimen: Nasopharyngeal Swab  Result Value Ref Range Status   SARS Coronavirus 2 NEGATIVE NEGATIVE Final    Comment: (NOTE) SARS-CoV-2 target nucleic acids are NOT DETECTED.  The SARS-CoV-2 RNA is generally detectable in upper and lower respiratory specimens during the acute phase of infection. Negative results do not preclude SARS-CoV-2 infection, do not rule out co-infections with other pathogens, and should not be used as the sole basis for treatment or other patient management decisions. Negative results must be combined with clinical observations, patient history, and epidemiological information. The expected result is Negative.  Fact Sheet for Patients: HairSlick.no  Fact Sheet for Healthcare  Providers: quierodirigir.com  This test is not yet approved or cleared by the [place] FDA and  has been authorized for detection and/or diagnosis of SARS-CoV-2 by FDA under an Emergency Use Authorization (EUA). This EUA will remain  in effect (meaning this test can be used) for the duration of the COVID-19 declaration under Se ction 564(b)(1) of the Act, 21 U.S.C. section 360bbb-3(b)(1), unless the authorization is terminated or revoked sooner.  Performed at [facility], [address] [place], [place] [address]   SARS Coronavirus 2 by RT PCR (hospital order, performed in [facility] hospital lab) Nasopharyngeal Nasopharyngeal Swab     Status: None   Collection Time: 09/30/20  8:20 PM   Specimen: Nasopharyngeal Swab  Result Value Ref Range Status   SARS Coronavirus 2 NEGATIVE NEGATIVE Final    Comment: (NOTE) SARS-CoV-2 target nucleic acids are NOT DETECTED.  The SARS-CoV-2 RNA is generally detectable in  upper and lower respiratory specimens during the acute phase of infection. The lowest concentration of SARS-CoV-2 viral copies this assay can detect is 250 copies / mL. A negative result does not preclude SARS-CoV-2 infection and should not be used as the sole basis for treatment or other patient management decisions.  A negative result may occur with improper specimen collection / handling, submission of specimen other than nasopharyngeal swab, presence of viral mutation(s) within the areas targeted by this assay, and inadequate number of viral copies (<250 copies / mL). A negative result must be combined with clinical observations, patient history, and epidemiological information.  Fact Sheet for Patients:   BoilerBrush.com.cy  Fact Sheet for Healthcare Providers: https://pope.com/  This test is not yet approved or  cleared by the [place] FDA and has been authorized for detection  and/or diagnosis of SARS-CoV-2 by FDA under an Emergency Use Authorization (EUA).  This EUA will remain in effect (meaning this test can be used) for the duration of the COVID-19 declaration under Section 564(b)(1) of the Act, 21 U.S.C. section 360bbb-3(b)(1), unless the authorization is terminated or revoked sooner.  Performed at [facility], [address], [place], [place] [address]       Radiology Studies: No results found.    Scheduled Meds: . cloNIDine  0.1 mg Transdermal Weekly  . enoxaparin (LOVENOX) injection  40 mg Subcutaneous Q24H  . methadone  80 mg Oral Daily  . pantoprazole (PROTONIX) IV  40 mg Intravenous Q24H  . prazosin  1 mg Oral QHS  . sertraline  150 mg Oral Daily   Continuous Infusions: . 0.9 % NaCl with KCl 20 mEq / L 125 mL/hr at 10/01/20 0904     LOS: 0 days      Time spent: 30 minutes   Noralee Stain, DO [facility] 10/01/2020, 1:01 PM   Available via Epic secure chat 7am-7pm After these hours, please refer to coverage provider listed on [email or web address]

## 2020-10-02 ENCOUNTER — Inpatient Hospital Stay (HOSPITAL_COMMUNITY): Payer: Medicaid Other

## 2020-10-02 LAB — BASIC METABOLIC PANEL
Anion gap: 10 (ref 5–15)
BUN: <5 mg/dL — ABNORMAL LOW (ref 6–20)
CO2: 21 mmol/L — ABNORMAL LOW (ref 22–32)
Calcium: 9.1 mg/dL (ref 8.9–10.3)
Chloride: 104 mmol/L (ref 98–111)
Creatinine, Ser: 0.54 mg/dL (ref 0.44–1.00)
GFR, Estimated: >60 mL/min (ref >60)
Glucose, Bld: 90 mg/dL (ref 70–99)
Potassium: 4.3 mmol/L (ref 3.5–5.1)
Sodium: 135 mmol/L (ref 135–145)

## 2020-10-02 LAB — GLUCOSE, CAPILLARY: Glucose-Capillary: 127 mg/dL — ABNORMAL HIGH (ref 70–99)

## 2020-10-02 MED ORDER — METHADONE HCL 10 MG PO TABS
80.0000 mg | ORAL_TABLET | Freq: Every day | ORAL | Status: DC
  Administered 2020-10-03: 80 mg via ORAL
  Filled 2020-10-02 (×2): qty 8

## 2020-10-02 MED ORDER — SODIUM CHLORIDE 0.9 % IV SOLN: INTRAVENOUS | Status: DC

## 2020-10-02 MED ORDER — LORAZEPAM 2 MG/ML IJ SOLN
1.0000 mg | Freq: Two times a day (BID) | INTRAMUSCULAR | Status: DC | PRN
  Administered 2020-10-02 (×2): 1 mg via INTRAVENOUS
  Filled 2020-10-02 (×2): qty 1

## 2020-10-02 MED ORDER — HYDROMORPHONE HCL 1 MG/ML IJ SOLN
1.0000 mg | INTRAMUSCULAR | Status: DC | PRN
  Administered 2020-10-02 – 2020-10-04 (×10): 1 mg via INTRAVENOUS
  Filled 2020-10-02 (×10): qty 1

## 2020-10-02 MED ORDER — LEVALBUTEROL HCL 0.63 MG/3ML IN NEBU
0.6300 mg | INHALATION_SOLUTION | Freq: Three times a day (TID) | RESPIRATORY_TRACT | Status: DC | PRN
  Administered 2020-10-03: 0.63 mg via RESPIRATORY_TRACT
  Filled 2020-10-02: qty 3

## 2020-10-02 MED ORDER — SCOPOLAMINE 1 MG/3DAYS TD PT72
1.0000 | MEDICATED_PATCH | TRANSDERMAL | Status: DC
  Administered 2020-10-02: 1.5 mg via TRANSDERMAL
  Filled 2020-10-02 (×2): qty 1

## 2020-10-02 NOTE — Progress Notes (Signed)
PROGRESS NOTE    Gabrielle Nguyen  ZOX:[id number] DOB: 08-10-1996 DOA: 09/30/2020 PCP: Duard Brady, MD (Inactive)     Brief Narrative:  Gabrielle Nguyen is a 25 y.o. 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 All other symptoms, body aches, chills, tremors and palpitations.She states she has not run out of methadone. She took a dose earlier, but had emesis afterwards. Stated that she has not been able to keep down her methadone for 3 days. She confirms that she takes 130 mg daily at baseline, gets it from [facility].   New events last 24 hours / Subjective: Continues to feel very poorly with lots of saliva/drooling inhibiting p.o. intake  Assessment & Plan:   Principal Problem:   Intractable nausea and vomiting Active Problems:   Methadone withdrawal (HCC)   Asthma   Anxiety with depression   Normocytic anemia   Intractable nausea, vomiting, abdominal pain secondary to methadone withdrawal -Resume methadone at a lower dose, titrate up to her home dose of 130 mg daily -Continue clonidine, hydroxyzine, loperamide, Robaxin, antiemetic, Dilaudid PRN   History of asthma -Without acute exacerbation at this time  Anxiety with depression -Continue Zoloft, xanax    DVT prophylaxis:  enoxaparin (LOVENOX) injection 40 mg Start: 10/01/20 1000  Code Status: Full code Family Communication: No family at bedside Disposition Plan:  Status is: Inpatient  Remains inpatient appropriate because:IV treatments appropriate due to intensity of illness or inability to take PO and Inpatient level of care appropriate due to severity of illness   Dispo: The patient is from: Home              Anticipated d/c is to: Home               Anticipated d/c date is: 2 days              Patient currently is not medically stable to d/c.   Difficult to place patient No      Antimicrobials:  Anti-infectives (From admission, onward)   None       Objective: Vitals:   10/01/20 2004 10/02/20 0019 10/02/20 0040 10/02/20 0446  BP: 121/88 (!) 152/90  127/80  Pulse: (!) 105 (!) 107  94  Resp: 20 20  (!) 22  Temp: 98.9 F (37.2 C) 98.2 F (36.8 C)  98.2 F (36.8 C)  TempSrc:    Oral  SpO2: 95% 96%  98%  Weight:   53.8 kg   Height:   5\' 8"  (1.727 m)     Intake/Output Summary (Last 24 hours) at 10/02/2020 1210 Last data filed at 10/02/2020 0500 Gross per 24 hour  Intake 2920.12 ml  Output --  Net 2920.12 ml   Filed Weights   10/02/20 0040  Weight: 53.8 kg    Examination: General exam: Appears uncomfortable, tremors  Respiratory system: Clear to auscultation. Respiratory effort normal. Cardiovascular system: S1 & S2 heard, tachycardic regular rhythm. No pedal edema. Gastrointestinal system: Abdomen is nondistended, soft and nontender. Normal bowel sounds heard. Central nervous system: Alert and oriented. Non focal exam.  Extremities: Symmetric in appearance bilaterally  Skin: No rashes, lesions or ulcers on exposed skin    Data Reviewed: I have personally reviewed following labs and imaging studies  CBC: Recent Labs  Lab 09/30/20  1410 10/01/20 0500  WBC 9.4 9.1  HGB 11.5* 9.7*  HCT 36.0 30.2*  MCV 85.5 85.8  PLT 243 226   Basic Metabolic Panel: Recent Labs  Lab 09/30/20 1410 09/30/20 1730 10/01/20 0500 10/02/20 0517  NA 138  --  135 135  K 3.6  --  3.4* 4.3  CL 101  --  103 104  CO2 24  --  22 21*  GLUCOSE 117*  --  114* 90  BUN 11  --  6 <5*  CREATININE 0.71  --  0.56 0.54  CALCIUM 9.7  --  8.9 9.1  MG  --  1.9  --   --    GFR: Estimated Creatinine Clearance: 90.5 mL/min (by C-G formula based on SCr of 0.54 mg/dL). Liver Function Tests: Recent Labs  Lab 09/30/20 1410  AST 25   ALT 13  ALKPHOS 64  BILITOT 0.3  PROT 8.5*  ALBUMIN 4.6   Recent Labs  Lab 09/30/20 1410  LIPASE 25   No results for input(s): AMMONIA in the last 168 hours. Coagulation Profile: No results for input(s): INR, PROTIME in the last 168 hours. Cardiac Enzymes: No results for input(s): CKTOTAL, CKMB, CKMBINDEX, TROPONINI in the last 168 hours. BNP (last 3 results) No results for input(s): PROBNP in the last 8760 hours. HbA1C: No results for input(s): HGBA1C in the last 72 hours. CBG: No results for input(s): GLUCAP in the last 168 hours. Lipid Profile: No results for input(s): CHOL, HDL, LDLCALC, TRIG, CHOLHDL, LDLDIRECT in the last 72 hours. Thyroid Function Tests: No results for input(s): TSH, T4TOTAL, FREET4, T3FREE, THYROIDAB in the last 72 hours. Anemia Panel: No results for input(s): VITAMINB12, FOLATE, FERRITIN, TIBC, IRON, RETICCTPCT in the last 72 hours. Sepsis Labs: No results for input(s): PROCALCITON, LATICACIDVEN in the last 168 hours.  Recent Results (from the past 240 hour(s))  SARS Coronavirus 2 by RT PCR (hospital order, performed in [facility] hospital lab) Nasopharyngeal Nasopharyngeal Swab     Status: None   Collection Time: 09/30/20  8:20 PM   Specimen: Nasopharyngeal Swab  Result Value Ref Range Status   SARS Coronavirus 2 NEGATIVE NEGATIVE Final    Comment: (NOTE) SARS-CoV-2 target nucleic acids are NOT DETECTED.  The SARS-CoV-2 RNA is generally detectable in upper and lower respiratory specimens during the acute phase of infection. The lowest concentration of SARS-CoV-2 viral copies this assay can detect is 250 copies / mL. A negative result does not preclude SARS-CoV-2 infection and should not be used as the sole basis for treatment or other patient management decisions.  A negative result may occur with improper specimen collection / handling, submission of specimen other than nasopharyngeal swab, presence of viral mutation(s) within the areas  targeted by this assay, and inadequate number of viral copies (<250 copies / mL). A negative result must be combined with clinical observations, patient history, and epidemiological information.  Fact Sheet for Patients:   BoilerBrush.com.cy  Fact Sheet for Healthcare Providers: https://pope.com/  This test is not yet approved or  cleared by the [place] FDA and has been authorized for detection and/or diagnosis of SARS-CoV-2 by FDA under an Emergency Use Authorization (EUA).  This EUA will remain in effect (meaning this test can be used) for the duration of the COVID-19 declaration under Section 564(b)(1) of the Act, 21 U.S.C. section 360bbb-3(b)(1), unless the authorization is terminated or revoked sooner.  Performed at [facility], [address] [place], [place] [address]  Radiology Studies: No results found.    Scheduled Meds: . cloNIDine  0.1 mg Transdermal Weekly  . enoxaparin (LOVENOX) injection  40 mg Subcutaneous Q24H  . methadone  80 mg Oral Daily  . pantoprazole (PROTONIX) IV  40 mg Intravenous Q24H  . prazosin  1 mg Oral QHS  . scopolamine  1 patch Transdermal Q72H  . sertraline  150 mg Oral Daily   Continuous Infusions: . sodium chloride 100 mL/hr at 10/02/20 1018     LOS: 1 day      Time spent: 30 minutes   Noralee Stain, DO [facility] 10/02/2020, 12:10 PM   Available via Epic secure chat 7am-7pm After these hours, please refer to coverage provider listed on [email or web address]

## 2020-10-02 NOTE — Plan of Care (Signed)

## 2020-10-03 ENCOUNTER — Inpatient Hospital Stay (HOSPITAL_COMMUNITY): Payer: Medicaid Other

## 2020-10-03 ENCOUNTER — Inpatient Hospital Stay (HOSPITAL_COMMUNITY): Payer: Medicaid Other | Admitting: Certified Registered Nurse Anesthetist

## 2020-10-03 ENCOUNTER — Inpatient Hospital Stay (HOSPITAL_COMMUNITY)
Admit: 2020-10-03 | Discharge: 2020-10-03 | Disposition: A | Discharge status: Home / Self Care | Payer: Medicaid Other | Attending: Pulmonary Disease | Admitting: Pulmonary Disease

## 2020-10-03 DIAGNOSIS — R112 Nausea with vomiting, unspecified: Secondary | ICD-10-CM | POA: Diagnosis not present

## 2020-10-03 DIAGNOSIS — R4182 Altered mental status, unspecified: Secondary | ICD-10-CM

## 2020-10-03 LAB — BLOOD GAS, ARTERIAL
Acid-Base Excess: 0.3 mmol/L (ref 0.0–2.0)
Acid-base deficit: 6.3 mmol/L — ABNORMAL HIGH (ref 0.0–2.0)
Acid-base deficit: 0.9 mmol/L (ref 0.0–2.0)
Bicarbonate: 27.2 mmol/L (ref 20.0–28.0)
Bicarbonate: 21.6 mmol/L (ref 20.0–28.0)
Bicarbonate: 21.4 mmol/L (ref 20.0–28.0)
Drawn by: 29503
FIO2: 40
FIO2: 100
MECHVT: 510 mL
O2 Saturation: 98.4 %
O2 Saturation: 77.5 %
O2 Saturation: 100 %
PEEP: 5 cmH2O
Patient temperature: 98.6
Patient temperature: 98.6
Patient temperature: 98.7
RATE: 20 resp/min
pCO2 arterial: 111 mmHg (ref 32.0–48.0)
pCO2 arterial: 30.2 mmHg — ABNORMAL LOW (ref 32.0–48.0)
pCO2 arterial: 24.5 mmHg — ABNORMAL LOW (ref 32.0–48.0)
pH, Arterial: 7.019 — CL (ref 7.350–7.450)
pH, Arterial: 7.469 — ABNORMAL HIGH (ref 7.350–7.450)
pH, Arterial: 7.549 — ABNORMAL HIGH (ref 7.350–7.450)
pO2, Arterial: 274 mmHg — ABNORMAL HIGH (ref 83.0–108.0)
pO2, Arterial: 41.4 mmHg — ABNORMAL LOW (ref 83.0–108.0)
pO2, Arterial: 491 mmHg — ABNORMAL HIGH (ref 83.0–108.0)

## 2020-10-03 LAB — COMPREHENSIVE METABOLIC PANEL
ALT: 12 U/L (ref 0–44)
AST: 20 U/L (ref 15–41)
Albumin: 4 g/dL (ref 3.5–5.0)
Alkaline Phosphatase: 59 U/L (ref 38–126)
Anion gap: 9 (ref 5–15)
BUN: 9 mg/dL (ref 6–20)
CO2: 27 mmol/L (ref 22–32)
Calcium: 8.8 mg/dL — ABNORMAL LOW (ref 8.9–10.3)
Chloride: 102 mmol/L (ref 98–111)
Creatinine, Ser: 0.56 mg/dL (ref 0.44–1.00)
GFR, Estimated: >60 mL/min (ref >60)
Glucose, Bld: 161 mg/dL — ABNORMAL HIGH (ref 70–99)
Potassium: 4.1 mmol/L (ref 3.5–5.1)
Sodium: 138 mmol/L (ref 135–145)
Total Bilirubin: 0.2 mg/dL — ABNORMAL LOW (ref 0.3–1.2)
Total Protein: 7.3 g/dL (ref 6.5–8.1)

## 2020-10-03 LAB — GLUCOSE, CAPILLARY
Glucose-Capillary: 108 mg/dL — ABNORMAL HIGH (ref 70–99)
Glucose-Capillary: 150 mg/dL — ABNORMAL HIGH (ref 70–99)
Glucose-Capillary: 84 mg/dL (ref 70–99)
Glucose-Capillary: 84 mg/dL (ref 70–99)
Glucose-Capillary: 89 mg/dL (ref 70–99)
Glucose-Capillary: 95 mg/dL (ref 70–99)

## 2020-10-03 LAB — RAPID URINE DRUG SCREEN, HOSP PERFORMED
Amphetamines: NOT DETECTED
Barbiturates: NOT DETECTED
Benzodiazepines: POSITIVE — AB
Cocaine: NOT DETECTED
Opiates: POSITIVE — AB
Tetrahydrocannabinol: NOT DETECTED

## 2020-10-03 LAB — MRSA PCR SCREENING: MRSA by PCR: NEGATIVE

## 2020-10-03 LAB — HCG, QUANTITATIVE, PREGNANCY: hCG, Beta Chain, Quant, S: 5 m[IU]/mL — ABNORMAL HIGH (ref <5)

## 2020-10-03 MED ORDER — FENTANYL CITRATE (PF) 100 MCG/2ML IJ SOLN
INTRAMUSCULAR | Status: AC
  Administered 2020-10-03: 100 ug
  Filled 2020-10-03: qty 2

## 2020-10-03 MED ORDER — NALOXONE HCL 0.4 MG/ML IJ SOLN
INTRAMUSCULAR | Status: AC
  Filled 2020-10-03: qty 1

## 2020-10-03 MED ORDER — PRAZOSIN HCL 1 MG PO CAPS
1.0000 mg | ORAL_CAPSULE | Freq: Every day | ORAL | Status: DC
  Administered 2020-10-03 – 2020-10-04 (×2): 1 mg via ORAL
  Filled 2020-10-03 (×3): qty 1

## 2020-10-03 MED ORDER — DOCUSATE SODIUM 100 MG PO CAPS
100.0000 mg | ORAL_CAPSULE | Freq: Two times a day (BID) | ORAL | Status: DC
  Administered 2020-10-03: 100 mg via ORAL
  Filled 2020-10-03 (×3): qty 1

## 2020-10-03 MED ORDER — SUCCINYLCHOLINE CHLORIDE 20 MG/ML IJ SOLN
INTRAMUSCULAR | Status: DC | PRN
  Administered 2020-10-03: 100 mg via INTRAVENOUS

## 2020-10-03 MED ORDER — FENTANYL CITRATE (PF) 100 MCG/2ML IJ SOLN
INTRAMUSCULAR | Status: DC | PRN
  Administered 2020-10-03 (×2): 50 ug via INTRAVENOUS

## 2020-10-03 MED ORDER — METHADONE HCL 10 MG PO TABS: 80.0000 mg | ORAL_TABLET | Freq: Every day | ORAL | Status: DC

## 2020-10-03 MED ORDER — ALPRAZOLAM 1 MG PO TABS
1.0000 mg | ORAL_TABLET | Freq: Three times a day (TID) | ORAL | Status: DC | PRN
  Administered 2020-10-03 – 2020-10-05 (×7): 1 mg via ORAL
  Filled 2020-10-03 (×8): qty 1

## 2020-10-03 MED ORDER — PROPOFOL 1000 MG/100ML IV EMUL
0.0000 ug/kg/min | INTRAVENOUS | Status: DC
  Administered 2020-10-03: 30 ug/kg/min via INTRAVENOUS
  Administered 2020-10-03: 20 ug/kg/min via INTRAVENOUS
  Administered 2020-10-03: 30 ug/kg/min via INTRAVENOUS
  Administered 2020-10-03 (×2): 39.343 ug/kg/min via INTRAVENOUS
  Filled 2020-10-03 (×2): qty 100

## 2020-10-03 MED ORDER — JEVITY 1.2 CAL PO LIQD
1000.0000 mL | ORAL | Status: DC
  Administered 2020-10-03: 1000 mL

## 2020-10-03 MED ORDER — PANTOPRAZOLE SODIUM 40 MG PO PACK: 40.0000 mg | PACK | Freq: Every day | ORAL | Status: DC

## 2020-10-03 MED ORDER — VITAL HIGH PROTEIN PO LIQD
1000.0000 mL | ORAL | Status: DC
  Administered 2020-10-03: 1000 mL

## 2020-10-03 MED ORDER — POLYETHYLENE GLYCOL 3350 17 G PO PACK: 17.0000 g | PACK | Freq: Every day | ORAL | Status: DC

## 2020-10-03 MED ORDER — FUROSEMIDE 40 MG PO TABS
40.0000 mg | ORAL_TABLET | Freq: Once | ORAL | Status: AC
  Administered 2020-10-03: 40 mg via ORAL
  Filled 2020-10-03: qty 1

## 2020-10-03 MED ORDER — PROSOURCE TF PO LIQD
45.0000 mL | Freq: Two times a day (BID) | ORAL | Status: DC
  Administered 2020-10-03: 45 mL
  Filled 2020-10-03: qty 45

## 2020-10-03 MED ORDER — PANTOPRAZOLE SODIUM 40 MG IV SOLR: 40.0000 mg | INTRAVENOUS | Status: DC

## 2020-10-03 MED ORDER — PRAZOSIN HCL 1 MG PO CAPS
1.0000 mg | ORAL_CAPSULE | Freq: Every day | ORAL | Status: DC
  Filled 2020-10-03: qty 1

## 2020-10-03 MED ORDER — PHENYLEPHRINE 40 MCG/ML (10ML) SYRINGE FOR IV PUSH (FOR BLOOD PRESSURE SUPPORT)
PREFILLED_SYRINGE | INTRAVENOUS | Status: AC
  Filled 2020-10-03: qty 10

## 2020-10-03 MED ORDER — METHYLPREDNISOLONE SODIUM SUCC 125 MG IJ SOLR
60.0000 mg | Freq: Once | INTRAMUSCULAR | Status: AC
  Administered 2020-10-03: 60 mg via INTRAVENOUS
  Filled 2020-10-03: qty 2

## 2020-10-03 MED ORDER — ETOMIDATE 2 MG/ML IV SOLN
INTRAVENOUS | Status: DC | PRN
  Administered 2020-10-03: 20 mg via INTRAVENOUS

## 2020-10-03 MED ORDER — INSULIN ASPART 100 UNIT/ML ~~LOC~~ SOLN: 1.0000 [IU] | SUBCUTANEOUS | Status: DC

## 2020-10-03 MED ORDER — FREE WATER
100.0000 mL | Status: DC
  Administered 2020-10-03 (×2): 100 mL

## 2020-10-03 MED ORDER — SERTRALINE HCL 50 MG PO TABS
150.0000 mg | ORAL_TABLET | Freq: Every day | ORAL | Status: DC
  Administered 2020-10-03: 150 mg
  Filled 2020-10-03: qty 1

## 2020-10-03 MED ORDER — PROPOFOL 1000 MG/100ML IV EMUL
INTRAVENOUS | Status: AC
  Filled 2020-10-03: qty 100

## 2020-10-03 MED ORDER — MIDAZOLAM HCL 2 MG/2ML IJ SOLN: 2.0000 mg | INTRAMUSCULAR | Status: DC | PRN

## 2020-10-03 MED ORDER — METHADONE HCL 10 MG PO TABS
130.0000 mg | ORAL_TABLET | Freq: Every day | ORAL | Status: DC
  Administered 2020-10-03 – 2020-10-05 (×3): 130 mg via ORAL
  Filled 2020-10-03 (×4): qty 13

## 2020-10-03 MED ORDER — CHLORHEXIDINE GLUCONATE CLOTH 2 % EX PADS
6.0000 | MEDICATED_PAD | Freq: Every day | CUTANEOUS | Status: DC
  Administered 2020-10-04: 6 via TOPICAL

## 2020-10-03 MED ORDER — NALOXONE HCL 0.4 MG/ML IJ SOLN
0.4000 mg | INTRAMUSCULAR | Status: DC | PRN
  Administered 2020-10-03: 0.4 mg via INTRAVENOUS

## 2020-10-03 MED ORDER — PROSOURCE TF PO LIQD: 45.0000 mL | Freq: Every day | ORAL | Status: DC

## 2020-10-03 MED ORDER — MIDAZOLAM HCL 2 MG/2ML IJ SOLN
INTRAMUSCULAR | Status: AC
  Filled 2020-10-03: qty 4

## 2020-10-03 MED ORDER — POLYETHYLENE GLYCOL 3350 17 G PO PACK
17.0000 g | PACK | Freq: Every day | ORAL | Status: DC
  Administered 2020-10-03: 17 g
  Filled 2020-10-03: qty 1

## 2020-10-03 MED ORDER — SODIUM CHLORIDE 0.9 % IV SOLN
3.0000 g | Freq: Four times a day (QID) | INTRAVENOUS | Status: DC
  Administered 2020-10-03 – 2020-10-04 (×5): 3 g via INTRAVENOUS
  Filled 2020-10-03 (×5): qty 8

## 2020-10-03 MED ORDER — METHADONE HCL 10 MG PO TABS
130.0000 mg | ORAL_TABLET | Freq: Every day | ORAL | Status: DC
Start: 2020-10-03 — End: 2020-10-03

## 2020-10-03 MED ORDER — SERTRALINE HCL 50 MG PO TABS
150.0000 mg | ORAL_TABLET | Freq: Every day | ORAL | Status: DC
  Administered 2020-10-04 – 2020-10-05 (×2): 150 mg via ORAL
  Filled 2020-10-03 (×2): qty 1

## 2020-10-03 MED ORDER — PANTOPRAZOLE SODIUM 40 MG PO PACK
40.0000 mg | PACK | Freq: Every day | ORAL | Status: DC
  Administered 2020-10-03: 40 mg via ORAL
  Filled 2020-10-03: qty 20

## 2020-10-03 MED ORDER — ORAL CARE MOUTH RINSE
15.0000 mL | Freq: Two times a day (BID) | OROMUCOSAL | Status: DC
  Administered 2020-10-03 – 2020-10-04 (×4): 15 mL via OROMUCOSAL

## 2020-10-03 MED ORDER — ROCURONIUM BROMIDE 10 MG/ML (PF) SYRINGE
PREFILLED_SYRINGE | INTRAVENOUS | Status: AC
  Filled 2020-10-03: qty 10

## 2020-10-03 MED ORDER — DOCUSATE SODIUM 50 MG/5ML PO LIQD
100.0000 mg | Freq: Two times a day (BID) | ORAL | Status: DC
  Administered 2020-10-03: 100 mg
  Filled 2020-10-03: qty 10

## 2020-10-03 MED ORDER — ETOMIDATE 2 MG/ML IV SOLN
INTRAVENOUS | Status: AC
  Administered 2020-10-03: 20 mg
  Filled 2020-10-03: qty 20

## 2020-10-03 MED ORDER — FLUMAZENIL 0.5 MG/5ML IV SOLN
0.3000 mg | Freq: Once | INTRAVENOUS | Status: AC
  Administered 2020-10-03: 0.3 mg via INTRAVENOUS
  Filled 2020-10-03: qty 5

## 2020-10-03 MED ORDER — MIDAZOLAM HCL 2 MG/2ML IJ SOLN
INTRAMUSCULAR | Status: DC | PRN
  Administered 2020-10-03: 2 mg via INTRAVENOUS

## 2020-10-03 NOTE — Procedures (Signed)
Extubation Procedure Note  Patient Details:   Name: Gabrielle Nguyen DOB: May 13, 1996 MRN: [id number]   Airway Documentation:    Vent end date: 10/03/20 Vent end time: 2000   Evaluation  O2 sats: currently acceptable Complications: No apparent complications Patient did tolerate procedure well. Bilateral Breath Sounds: Clear   Yes  Krzysztof Reichelt, Evlyn Courier 10/03/2020, 8:05 PM  Pt self extubated, currently stable.

## 2020-10-03 NOTE — Progress Notes (Signed)
Received page from bedside RN with concerns for pt AMS. On evaluation, pt is unresponsive to verbal commands. She is diaphoretic, SOB, and requiring oxygen. LS very diminished throughout.  Previous RN states pt was caught attempting to take pills found in her purse. Transferred to SDU for closer monitoring  Altered Mental Status/ Acute Metabolic encephalopathy - ABG stat - CMP, CBC - Flumazenil iv  Acute respiratory distress - Xopenex - Chest ray stat - Prednisone IV for possible asthma exacerbation  Contacted PCCM for further managament as pt is not responding to current interventions and will most likely need intubation/sedation.   Audrea Muscat, NP [facility] 7p-7a [phone]

## 2020-10-03 NOTE — TOC Progression Note (Signed)
Transition of Care [facility]) - Progression Note    Patient Details  Name: Gabrielle Nguyen MRN: [id number] Date of Birth: March 14, 1996  [facility] CM/SW Contact  Golda Acre, RN Phone Number: 10/03/2020, 9:25 AM  Clinical Narrative:    25 year old with asthma, depression, anxiety, substance abuse on methadone Admitted with nausea, vomiting.  Presumed methadone withdrawal as she had not been able to keep her medications down.  History notable for multiple admissions for similar episodes.  She was started on low-dose methadone.  PCCM consulted on 2/1 for altered mental status, dyspnea Nurse notes that she probably had been taking unknown pills from her purse.  She has received 0.4 mg narcan, 0.3 mg of flumazenil without any improvement  Past Medical History:    has a past medical history of Anemia, Anxiety, Anxiety, Asthma, Depression, HPV (human papilloma virus) anogenital infection, Oppositional defiant disorder, Seasonal allergies, and Vaginal Pap smear, abnormal.  Significant Hospital Events:  1/29 Admit 2/1 Intubated PLAN: to unstable at this time to determine.        Expected Discharge Plan and Services                                                 Social Determinants of Health (SDOH) Interventions    Readmission Risk Interventions No flowsheet data found.

## 2020-10-03 NOTE — Progress Notes (Signed)
2100 Entered patient's room to measure vital signs and found patient with unidentified pills in her hand. Patient willingly gave this RN the 2 pills, I notified the charge nurse Marcelino Duster and the pills were sent to pharmacy. Patient denied having any further pills in her purse.   2330  Entered patients room because the patient's bed alarm was sounding. Patient was retrieving her purse, I assited the patient back to bed. Charge nurse came to bedside to assist. . Rapid Response nurse called due to Patient displayed increased RR, labored breathing and increased HR.  Caprice Red, RN

## 2020-10-03 NOTE — Progress Notes (Signed)
Pharmacy Antibiotic Note  Gabrielle Nguyen is a 25 y.o. female admitted on 09/30/2020 with nausea & vomiting.  Pharmacy has been consulted for Unasyn dosing for aspiration PNA.  Plan: Unasyn 3 gm IV q6h  Height: 5\' 8"  (172.7 cm) Weight: 53.8 kg (118 lb 9.7 oz) IBW/kg (Calculated) : 63.9  Temp (24hrs), Avg:98.2 F (36.8 C), Min:97.8 F (36.6 C), Max:98.5 F (36.9 C)  Recent Labs  Lab 09/30/20 1410 10/01/20 0500 10/02/20 0517 10/03/20 0110  WBC 9.4 9.1  --   --   CREATININE 0.71 0.56 0.54 0.56    Estimated Creatinine Clearance: 90.5 mL/min (by C-G formula based on SCr of 0.56 mg/dL).    No Known Allergies  Antimicrobials this admission: 2/1 Unasyn>>  Dose adjustments this admission:  Microbiology results: 1/29 covid neg 2/1 MRSA neg  Thank you for allowing pharmacy to be a part of this patient's care.  2/29, Pharm.D 10/03/2020 7:30 AM

## 2020-10-03 NOTE — Progress Notes (Signed)
EEG Completed; Results Pending  

## 2020-10-03 NOTE — Progress Notes (Signed)
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  Presumed methadone withdrawal as she had not been able to keep her medications down.  History notable for multiple admissions for similar episodes.  She was started on low-dose methadone.  PCCM consulted on 2/1 for altered mental status, dyspnea Nurse notes that she probably had been taking unknown pills from her purse.  She has received 0.4 mg narcan, 0.3 mg of flumazenil without any improvement  Past Medical History:    has a past medical history of Anemia, Anxiety, Anxiety, Asthma, Depression, HPV (human papilloma virus) anogenital infection, Oppositional defiant disorder, Seasonal allergies, and Vaginal Pap smear, abnormal.  Significant Hospital Events:  1/29 Admit 2/1 Intubated  Consults:  PCCM  Procedures:  ETT 2/1 >>   Significant Diagnostic Tests:  CT abdomen pelvis 09/22/2020-prominent stool, dilated hepatic and common bile duct, normal appendix.  Chest x-ray 10/03/2020- LLL infiltrate vs atelectasis  Micro Data:  covid negative 09/30/20  Antimicrobials:  unasyn 10/03/20>  Interim History / Subjective:  She denies pain.   Objective   Blood pressure 105/71, pulse (!) 106, temperature 98.5 F (36.9 C), temperature source Axillary, resp. rate (!) 29, height 5\' 8"  (1.727 m), weight 53.8 kg, SpO2 100 %, unknown if currently breastfeeding.    Vent Mode: PRVC FiO2 (%):  [30 %-100 %] 100 % Set Rate:  [20 bmp-26 bmp] 20 bmp Vt Set:  [510 mL] 510 mL PEEP:  [5 cmH20] 5 cmH20 Plateau Pressure:  [12 cmH20-14 cmH20] 12 cmH20   Intake/Output Summary (Last 24 hours) at 10/03/2020 0726 Last data filed at 10/03/2020 0300 Gross per 24 hour   Intake 1408.53 ml  Output 1 ml  Net 1407.53 ml   Filed Weights   10/02/20 0040  Weight: 53.8 kg    Examination: BP 140/83   Pulse (!) 118   Temp 98.5 F (36.9 C) (Axillary)   Resp 20   Ht 5\' 8"  (1.727 m)   Wt 53.8 kg   LMP  (LMP Unknown)   SpO2 99%   BMI 18.03 kg/m   Gen:      Critically ill-appearing young woman lying in bed no acute distress, intubated and sedated HEENT: [place]/AT, eyes anicteric, endotracheal tube in place Lungs:    CTAB, breathing synchronously with the vent.  No endotracheal secretions. CV:        Tachycardic, regular rhythm, no murmurs Abd:      Soft, nontender, nondistended Ext:    No clubbing, cyanosis, or edema Skin:      Warm, dry, no rashes or mottling Neuro: RASS -3, able to answer yes and no questions once awake.  Moving extremities, attempting to communicate nonverbally  Resolved Hospital Problem list     Assessment & Plan:  Altered mental status, likely drug overdose Acute metabolic encephalopathy With excessive salivation and mydriasis she may have a sympathomimetic toxidrome> UDS not helpful in determining what additional medication she may have taken -CT head reviewed-no acute findings -EEG ordered  Acute hypoxic and hypercarbic respiratory failure requiring MV, likely due to overdose -Continue low tidal volume ventilation, 4 to 8 cc/kg ideal body weight for potassium 30 and driving pressure less than 15-meeting goals -ABG confirms significant improvement.  Okay to start vent weaning. -Continue bronchodilators.  Agree with holding off on additional steroids.  Concern for acute opiate withdrawal precipitating her symptoms for this admission -Resume PTA dose of methadone  Anxiety with depression -Continue PTA Zoloft -PRN benzos -mother reports that her anxiety attacks look like she is having a seizure. She will need psych consultation for help managing symptoms after extubation.  Chronic anemia -transfuse for Hb <7 -con't to  monitor  Hyperglycemia -SSI PRN -goal BG 140-180  HcG increased at admission -rechecking today to see if increasing  GoC -4th admission in 6 weeks with similar symptoms per her mother, her mother thinks all of this is drug-related. Her drug of abuse as a youth was xanax, which she has been prescribed again recently. Her mother wants her to get substance abuse treatment after discharge.     Best practice (evaluated daily)  Diet: NPO Pain/Anxiety/Delirium protocol (if indicated): Propofol, versed PRN VAP protocol (if indicated): Ordered DVT prophylaxis: Lovenox GI prophylaxis: PPI Glucose control: Monitor Mobility: Bed Disposition:Full  Goals of Care:  Last date of multidisciplinary goals of care discussion: Pending Family and staff present: Called fiance Aleatha Borer- unable to leave a message. Called mother Justin Mend and updated via phone. Summary of discussion:  Follow up goals of care discussion due: 2/7 Code Status: Full  Labs   CBC: Recent Labs  Lab 09/30/20 1410 10/01/20 0500  WBC 9.4 9.1  HGB 11.5* 9.7*  HCT 36.0 30.2*  MCV 85.5 85.8  PLT 243 226    Basic Metabolic Panel: Recent Labs  Lab 09/30/20 1410 09/30/20 1730 10/01/20 0500 10/02/20 0517 10/03/20 0110  NA 138  --  135 135 138  K 3.6  --  3.4* 4.3 4.1  CL 101  --  103 104 102  CO2 24  --  22 21* 27  GLUCOSE 117*  --  114* 90 161*  BUN 11  --  6 <5* 9  CREATININE 0.71  --  0.56 0.54 0.56  CALCIUM 9.7  --  8.9 9.1 8.8*  MG  --  1.9  --   --   --    GFR: Estimated Creatinine Clearance: 90.5 mL/min (by C-G formula based on SCr of 0.56 mg/dL). Recent Labs  Lab 09/30/20 1410 10/01/20 0500  WBC 9.4 9.1    Liver Function Tests: Recent Labs  Lab 09/30/20 1410 10/03/20 0110  AST 25 20  ALT 13 12  ALKPHOS 64 59  BILITOT 0.3 0.2*  PROT 8.5* 7.3  ALBUMIN 4.6 4.0   Recent Labs  Lab 09/30/20 1410  LIPASE 25   No results for input(s): AMMONIA in the last 168 hours.  ABG    Component  Value Date/Time   PHART 7.469 (H) 10/03/2020 0418   PCO2ART 30.2 (L) 10/03/2020 0418   PO2ART 41.4 (L) 10/03/2020 0418   HCO3 21.6 10/03/2020 0418   TCO2 15 (L) 07/04/2020 0920   ACIDBASEDEF 0.9 10/03/2020 0418   O2SAT 77.5 10/03/2020 0418     Coagulation Profile: No results for input(s): INR, PROTIME in the last 168 hours.  Cardiac Enzymes: No results for input(s): CKTOTAL, CKMB, CKMBINDEX, TROPONINI in the last 168 hours.  HbA1C: Hgb A1c MFr Bld  Date/Time Value Ref Range Status  11/26/2019 10:38 AM 5.8 (H) 4.8 - 5.6 % Final    Comment:    (NOTE) Pre diabetes:          5.7%-6.4% Diabetes:              >6.4% Glycemic control for   <7.0%  adults with diabetes     CBG: Recent Labs  Lab 10/02/20 2343 10/03/20 0040  GLUCAP 127* 150*   This patient is critically ill with multiple organ system failure which requires frequent high complexity decision making, assessment, support, evaluation, and titration of therapies. This was completed through the application of advanced monitoring technologies and extensive interpretation of multiple databases. During this encounter critical care time was devoted to patient care services described in this note for 41 minutes.   Steffanie Dunn, DO 10/03/20 7:26 AM [facility]  From 7AM- 7PM if no response to pager, please call [phone]. After hours, 7PM- 7AM, please call Elink  [phone].

## 2020-10-03 NOTE — Transfer of Care (Signed)
Immediate Anesthesia Transfer of Care Note  Patient: Gabrielle Nguyen  Procedure(s) Performed: AN AD HOC INTUBATION  Patient Location: PACU and ICU  Anesthesia Type:General  Level of Consciousness: awake, lethargic and obtunded  Airway & Oxygen Therapy: Patient Spontanous Breathing  Post-op Assessment: Report given to RN and Post -op Vital signs reviewed and stable  Post vital signs: Reviewed and stable  Last Vitals:  Vitals Value Taken Time  BP 142/104 10/03/20 0232  Temp    Pulse 140 10/03/20 0231  Resp 26 10/03/20 0232  SpO2 100 % 10/03/20 0231  Vitals shown include unvalidated device data.  Last Pain:  Vitals:   10/03/20 0045  TempSrc: Axillary  PainSc:       Patients Stated Pain Goal: 3 (10/02/20 2010)  Complications: No complications documented.

## 2020-10-03 NOTE — Progress Notes (Signed)
RT tried to wean the Pt but she was still sleepy and wasn't breathing enough . Highest rr was 8 and wasn't consistent

## 2020-10-03 NOTE — Procedures (Signed)
Patient Name: Tasnia Spegal  MRN: [id number]  Epilepsy Attending: Charlsie Quest  Referring Physician/Provider: Dr Chilton Greathouse Date: 10/03/2020 Duration: 23.17 mins  Patient history: 24yo F with ams. EEG to evaluate for seizure  Level of alertness: Awake, drowsy, sleep, comatose, lethargic  AEDs during EEG study: Propofol  Technical aspects: This EEG study was done with scalp electrodes positioned according to the 10-20 International system of electrode placement. Electrical activity was acquired at a sampling rate of 500Hz  and reviewed with a high frequency filter of 70Hz  and a low frequency filter of 1Hz . EEG data were recorded continuously and digitally stored.   Description: The posterior dominant rhythm consists of 8 Hz activity of moderate voltage (25-35 uV) seen predominantly in posterior head regions, symmetric and reactive to eye opening and eye closing. There is an excessive amount of 15 to 18 Hz beta activity with irregular morphology distributed symmetrically and diffusely. Hyperventilation and photic stimulation were not performed.     ABNORMALITY -Excessive beta, generalized  IMPRESSION: This study is within normal limits. No seizures or epileptiform discharges were seen throughout the recording. The excessive beta activity seen in the background is most likely due to the effect of benzodiazepine and is a benign EEG pattern.   Shaneice Barsanti 

## 2020-10-03 NOTE — Progress Notes (Signed)
eLink Physician-Brief Progress Note Patient Name: Gabrielle Nguyen DOB: 1996/01/01 MRN: [id number]   Date of Service  10/03/2020  HPI/Events of Note  Altered Mental Statue - GCS = 4. No response to Flumazenil earlier. No response to Narcan 0.4 mg IV X 2. Will require intubation and ventilation. PCCM ground team now at bedside.   eICU Interventions  Further management per PCCM ground team.      Intervention Category Major Interventions: Change in mental status - evaluation and management  Benigno Check Eugene 10/03/2020, 1:49 AM

## 2020-10-03 NOTE — Progress Notes (Signed)
eLink Physician-Brief Progress Note Patient Name: Gabrielle Nguyen DOB: 07-Jun-1996 MRN: [id number]   Date of Service  10/03/2020  HPI/Events of Note  Venus Blood gas = 7.469/30.2/41.4. However, sat = 100% by bedside oximetry.   eICU Interventions  Plan: 1. Decrease PRVC rate to 20. 2. Repeat ABG at 7:15 SM.     Intervention Category Major Interventions: Respiratory failure - evaluation and management;Acid-Base disturbance - evaluation and management  Dovid Bartko Eugene 10/03/2020, 5:13 AM

## 2020-10-03 NOTE — Progress Notes (Signed)
RT called to room due to VENT alarm, upon arrival pt had self extubated.  Pt currently alert and oriented on room air.  RN aware, MD notified.

## 2020-10-03 NOTE — Progress Notes (Signed)
Initial Nutrition Assessment  DOCUMENTATION CODES:   Non-severe (moderate) malnutrition in context of social or environmental circumstances  INTERVENTION:  - will order TF regimen: Jevity 1.2 @ 55 ml/hr with 45 ml Prosource TF once/day and 100 ml free water every 4 hours.  - this regimen will provide 1624 kcal, 84 grams protein, and 1665 ml free water.   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  GOAL:   Patient will meet greater than or equal to 90% of their needs  MONITOR:   Vent status,TF tolerance,Labs,Weight trends  REASON FOR ASSESSMENT:   Ventilator,Consult Enteral/tube feeding initiation and management  ASSESSMENT:   25 y.o. 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 She presented to the ED with complaints of abdominal pain and N/V that began early on 1/29. She reported running out of methadone.  Patient discussed in rounds this AM.  In the ED, patient consumed unknown pills from her purse. Rapid Response called around 2340 on 1/31 due to patient becoming diaphoretic and with increased WOB.   She was intubated by Anesthesia today at ~0230. OGT in place (no imaging reports make mention of where tube terminates).   She remains intubated at this time. No family/visitors present at bedside.  Weight yesterday was 119 lb and PTA the most recently documented weight was on 08/26/20 when she weighed 123 lb. This indicates 4 lb weight loss (3.2% body weight) in the past 1 month; not significant for time frame.    Per notes: - AMS with suspected drug OD - acute metabolic encephalopathy - concern for opiate withdrawal   Patient is currently intubated on ventilator support MV:  9.3 L/min Temp (24hrs), Avg:99.1 F (37.3 C), Min:97.8 F (36.6 C), Max:100.4 F (38 C) Propofol: 9.5 ml/hr (251 kcal) BP: 111/76 and MAP: 86  Labs reviewed; CBGs: 89 and 95 mg/dl. Medications reviewed; 100 mg colace BID, 40 mg oral lasix x1 dose 2/1, sliding scale novolog, 60 mg solu-medrol x1 dose 2/1, 40 mg protonix per OGT/day, 17 g miralax/day. IVF; NS @ 100 ml/hr. Drip; propofol @ 30 mcg/kg/min.     NUTRITION - FOCUSED PHYSICAL EXAM:  Flowsheet Row Most Recent Value  Orbital Region No depletion  Upper Arm Region Mild depletion  Thoracic and Lumbar Region Mild depletion  Buccal Region No depletion  Temple Region No depletion  Clavicle Bone Region Moderate depletion  Clavicle and Acromion Bone Region Moderate depletion  Scapular Bone Region Moderate depletion  Dorsal Hand Unable to assess  [bilateral mittens]  Patellar Region Mild depletion  Anterior Thigh Region Mild depletion  Posterior Calf Region Mild depletion  Edema (RD Assessment) None  Hair Reviewed  Eyes Unable to assess  Mouth Unable to assess  Skin Reviewed  Nails Unable to assess       Diet Order:   Diet Order            Diet clear liquid Room service appropriate? Yes; Fluid consistency: Thin  Diet effective now                 EDUCATION NEEDS:   No education needs have been identified at this time  Skin:  Skin Assessment: Reviewed RN Assessment  Last BM:  1/31 (type 1)  Height:   Ht Readings from Last 1 Encounters:  10/02/20  5\' 8"  (1.727 m)    Weight:   Wt Readings from Last 1 Encounters:  10/02/20 53.8 kg    Estimated Nutritional Needs:  Kcal:  1697 kcal Protein:  81-107 grams Fluid:  >/= 1.7 L/day     10/04/20, MS, RD, LDN, CNSC Inpatient Clinical Dietitian RD pager # available in [facility]  After hours/weekend pager # available in [facility]

## 2020-10-03 NOTE — Anesthesia Procedure Notes (Signed)
Procedure Name: Intubation Date/Time: 10/03/2020 2:06 AM Performed by: Gerald Leitz, CRNA Pre-anesthesia Checklist: Patient identified, Patient being monitored, Timeout performed, Emergency Drugs available and Suction available Patient Re-evaluated:Patient Re-evaluated prior to induction Oxygen Delivery Method: Circle system utilized Preoxygenation: Pre-oxygenation with 100% oxygen Induction Type: IV induction and Rapid sequence Ventilation: Mask ventilation without difficulty Laryngoscope Size: Mac and 3 Grade View: Grade II Tube type: Oral Tube size: 7.0 mm Number of attempts: 1 Placement Confirmation: ETT inserted through vocal cords under direct vision,  positive ETCO2 and breath sounds checked- equal and bilateral Secured at: 21 cm Tube secured with: Tape Dental Injury: Teeth and Oropharynx as per pre-operative assessment

## 2020-10-03 NOTE — Consult Note (Addendum)
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  Presumed methadone withdrawal as she had not been able to keep her medications down.  History notable for multiple admissions for similar episodes.  She was started on low-dose methadone.  PCCM consulted on 2/1 for altered mental status, dyspnea Nurse notes that she probably had been taking unknown pills from her purse.  She has received 0.4 mg narcan, 0.3 mg of flumazenil without any improvement  Past Medical History:    has a past medical history of Anemia, Anxiety, Anxiety, Asthma, Depression, HPV (human papilloma virus) anogenital infection, Oppositional defiant disorder, Seasonal allergies, and Vaginal Pap smear, abnormal.  Significant Hospital Events:  1/29 Admit 2/1 Intubated  Consults:  PCCM  Procedures:  ETT 2/1 >>   Significant Diagnostic Tests:  CT abdomen pelvis 09/22/2020-prominent stool, dilated hepatic and common bile duct, normal appendix.  Chest x-ray 10/03/2020-atelectatic changes.  I have reviewed the images personally.  Micro Data:    Antimicrobials:    Interim History / Subjective:    Objective   Blood pressure (!) 134/112, pulse (!) 106, temperature 98.5 F (36.9 C), temperature source Axillary, resp. rate (!) 29, height 5\' 8"  (1.727 m), weight 53.8 kg, SpO2 98 %, unknown if currently breastfeeding.        Intake/Output Summary (Last 24 hours) at 10/03/2020 0122 Last data filed at 10/02/2020 1500 Gross per 24 hour  Intake 600 ml  Output 1 ml  Net 599 ml   Filed Weights   10/02/20 0040  Weight: 53.8 kg    Examination: Blood pressure (!) 134/112, pulse (!) 106, temperature 98.5 F (36.9 C), temperature  source Axillary, resp. rate (!) 29, height 5\' 8"  (1.727 m), weight 53.8 kg, SpO2 98 %, Gen:      No acute distress HEENT:  EOMI, sclera anicteric, mild frothing at the mouth. Neck:     No masses; no thyromegaly Lungs:    Diminished air entry, no wheeze appreciated CV:         Regular rate and rhythm; no murmurs Abd:      + bowel sounds; soft, non-tender; no palpable masses, no distension Ext:    No edema; adequate peripheral perfusion Skin:      Warm and dry; no rash Neuro: Unresponsive, dilated pupils  Resolved Hospital Problem list     Assessment & Plan:  Altered mental status, possible drug overdose Acute metabolic encephalopathy With excessive salivation and mydriasis she may have a sympathomimetic toxidrome Recheck urine drug screen for amphetamine use CT head, EEG  Hypercarbic respiratory failure She is not wheezing.  Suspicion for asthma exacerbation is low Continue bronchodilators, hold off on additional steroids Lasix 40 mg x 1 for possible negative pressure edema. Follow ABG and CXR post intubation  Anxiety with depression Continue Zoloft.  Benzos as needed   Best practice (evaluated daily)  Diet: NPO Pain/Anxiety/Delirium protocol (if indicated): Propofol, versed PRN VAP protocol (if indicated): Ordered DVT prophylaxis: Lovenox GI prophylaxis: PPI Glucose control: Monitor Mobility: Bed Disposition:Full  Goals of Care:  Last date of multidisciplinary goals of care discussion: [place] Family and staff present: No family at bedside.  I husband but got voicemail.  Left message requesting call back Summary of discussion:  Follow up goals of  care discussion due: 2/7 Code Status: Full  Labs   CBC: Recent Labs  Lab 09/30/20 1410 10/01/20 0500  WBC 9.4 9.1  HGB 11.5* 9.7*  HCT 36.0 30.2*  MCV 85.5 85.8  PLT 243 226    Basic Metabolic Panel: Recent Labs  Lab 09/30/20 1410 09/30/20 1730 10/01/20 0500 10/02/20 0517  NA 138  --  135 135  K 3.6  --   3.4* 4.3  CL 101  --  103 104  CO2 24  --  22 21*  GLUCOSE 117*  --  114* 90  BUN 11  --  6 <5*  CREATININE 0.71  --  0.56 0.54  CALCIUM 9.7  --  8.9 9.1  MG  --  1.9  --   --    GFR: Estimated Creatinine Clearance: 90.5 mL/min (by C-G formula based on SCr of 0.54 mg/dL). Recent Labs  Lab 09/30/20 1410 10/01/20 0500  WBC 9.4 9.1    Liver Function Tests: Recent Labs  Lab 09/30/20 1410  AST 25  ALT 13  ALKPHOS 64  BILITOT 0.3  PROT 8.5*  ALBUMIN 4.6   Recent Labs  Lab 09/30/20 1410  LIPASE 25   No results for input(s): AMMONIA in the last 168 hours.  ABG    Component Value Date/Time   HCO3 14.7 (L) 07/04/2020 0920   TCO2 15 (L) 07/04/2020 0920   ACIDBASEDEF 8.0 (H) 07/04/2020 0920   O2SAT 85.0 07/04/2020 0920     Coagulation Profile: No results for input(s): INR, PROTIME in the last 168 hours.  Cardiac Enzymes: No results for input(s): CKTOTAL, CKMB, CKMBINDEX, TROPONINI in the last 168 hours.  HbA1C: Hgb A1c MFr Bld  Date/Time Value Ref Range Status  11/26/2019 10:38 AM 5.8 (H) 4.8 - 5.6 % Final    Comment:    (NOTE) Pre diabetes:          5.7%-6.4% Diabetes:              >6.4% Glycemic control for   <7.0% adults with diabetes     CBG: Recent Labs  Lab 10/02/20 2343 10/03/20 0040  GLUCAP 127* 150*    Review of Systems:   Unable to obtain due to encephalopathy  Past Medical History:  She,  has a past medical history of Anemia, Anxiety, Anxiety, Asthma, Depression, HPV (human papilloma virus) anogenital infection, Oppositional defiant disorder, Seasonal allergies, and Vaginal Pap smear, abnormal.   Surgical History:   Past Surgical History:  Procedure Laterality Date  . DENTAL SURGERY       Social History:   reports that she quit smoking about 7 years ago. She has never used smokeless tobacco. She reports that she does not drink alcohol and does not use drugs.   Family History:  Her family history includes Alcohol abuse in her  father; Anxiety disorder in her maternal grandmother; Bipolar disorder in her father; Drug abuse in her father; Heart attack in her paternal grandfather.   Allergies No Known Allergies   Home Medications  Prior to Admission medications   Medication Sig Start Date End Date Taking? Authorizing Provider  acetaminophen 325 MG tablet Take 2 tablets (650 mg total) by mouth every 6 (six) hours as needed for mild pain or headache. 11/28/19  Yes Fair, Hoyle Sauer, MD  ALPRAZolam Prudy Feeler) 1 MG tablet Take 1 mg by mouth 2 (two) times daily as needed for anxiety. 08/11/20  Yes [provider]  methadone (DOLOPHINE) 10 MG/ML solution Take 130 mg  by mouth daily. Verified with on call personnel for [facility] [place]. Last dose was 08-23-20   Yes [provider]  prazosin (MINIPRESS) 1 MG capsule Take 1 mg by mouth at bedtime. 07/14/20  Yes [provider]  sertraline (ZOLOFT) 100 MG tablet Take 150 mg by mouth daily. 06/16/20  Yes [provider]     Critical care time:    The patient is critically ill with multiple organ system failure and requires high complexity decision making for assessment and support, frequent evaluation and titration of therapies, advanced monitoring, review of radiographic studies and interpretation of complex data.   Critical Care Time devoted to patient care services, exclusive of separately billable procedures, described in this note is 45 minutes.   Chilton Greathouse MD [facility] See Amion for pager  If no response to pager , please call [phone] until 7pm After 7:00 pm call Elink  [phone] 10/03/2020, 2:19 AM

## 2020-10-03 NOTE — Significant Event (Signed)
Rapid Response Event Note   Reason for Call :  Increased work of breathing. Concerned that patient Gabrielle Nguyen have taken medication out of her purse.  Found 1.5 pills of Xanax and 6 unidentified medication.    Initial Focused Assessment:  Patient is found to be diaphoretic, increased work of breathing,  Retracting and tripoding.  Patient is slow to respond to questions, she denies taking medication from purse, she states that she is anxious and that she needed medication.   0000 Patient with increased lethargy and not answering questions now transfer to SD and request Blount to come to bedside.   Interventions:  Notified Blount NP CXR Bmet Gave scheduled Methadone Xopenex  IV Prednisone  Flumazenil   Plan of Care:  Transfer to RM 1237 stepdown for closure monitoring.   Event Summary:  Patient remains diaphorecitc with increased WOB, [facility] consulted PCCM for further management    MD Notified: BLOUNT NP  Call Time: 2333 Arrival Time:2340 End Time: 0040  Sharyn Lull Steel [place], RN

## 2020-10-03 NOTE — Anesthesia Preprocedure Evaluation (Addendum)
Anesthesia Evaluation  Patient identified by MRN, date of birth, ID band Patient unresponsive    Reviewed: Allergy & Precautions, NPO status , Patient's Chart, lab work & pertinent test results, reviewed documented beta blocker date and time Preop documentation limited or incomplete due to emergent nature of procedure.  Airway Mallampati: II  TM Distance: >3 FB Neck ROM: Limited  Mouth opening: Limited Mouth Opening  Dental no notable dental hx.    Pulmonary asthma , former smoker,    + rhonchi        Cardiovascular negative cardio ROS   Rhythm:Regular Rate:Tachycardia     Neuro/Psych Anxiety Depression    GI/Hepatic negative GI ROS, Neg liver ROS,   Endo/Other  negative endocrine ROS  Renal/GU negative Renal ROS  negative genitourinary   Musculoskeletal negative musculoskeletal ROS (+)   Abdominal Normal abdominal exam  (+)   Peds  Hematology  (+) Blood dyscrasia, anemia ,   Anesthesia Other Findings   Reproductive/Obstetrics negative OB ROS                            Anesthesia Physical Anesthesia Plan  ASA: II and emergent  Anesthesia Plan: General   Post-op Pain Management:    Induction: Intravenous and Rapid sequence  PONV Risk Score and Plan: 2  Airway Management Planned: Oral ETT  Additional Equipment:   Intra-op Plan:   Post-operative Plan:   Informed Consent:     Only emergency history available  Plan Discussed with:   Anesthesia Plan Comments: (Plan discussed with MDA. Proceed as RSI with proper staff at bedside.)       Anesthesia Quick Evaluation

## 2020-10-03 NOTE — TOC Progression Note (Signed)
Transition of Care [facility]) - Progression Note    Patient Details  Name: Gabrielle Nguyen MRN: [id number] Date of Birth: [birth year]  Transition of Care [facility]) CM/SW Contact  Golda Acre, RN Phone Number: 10/03/2020, 1:41 PM  Clinical Narrative:    Spoke with Mother and gave her the substance abuse resources.  Mother states that patient has been in 4 hospitals now for the same substance abuse of xanax over the past 5 weeks.  Finance will not allow patient to return to home due to continued drug use.  There is an infant in home that is the patients daughter. She run away from the Mother's home in the past.  She is acknowledgeable on how to obtain xanax over the Internet and has recently done so.  Mother has agreed to wait to see what psych. Has to say about what steps to take next.         Expected Discharge Plan and Services                                                 Social Determinants of Health (SDOH) Interventions    Readmission Risk Interventions No flowsheet data found.

## 2020-10-03 NOTE — Progress Notes (Signed)
eLink Physician-Brief Progress Note Patient Name: Ilsa Bonello DOB: 04-Sep-1995 MRN: [id number]   Date of Service  10/03/2020  HPI/Events of Note  Camera: Self extubated. VS stable. Alert. On room air.  Methadone withdrawal/s/p catatonic state now.  eICU Interventions  - continue care. - watch respiratory and mental status for now. - asp /fall precautions     Intervention Category Major Interventions: Other:  Ranee Gosselin 10/03/2020, 8:06 PM

## 2020-10-03 NOTE — Progress Notes (Signed)
eLink Physician-Brief Progress Note Patient Name: Gabrielle Nguyen DOB: 22-May-1996 MRN: [id number]   Date of Service  10/03/2020  HPI/Events of Note  RN says pt is extremely anxious. Pt is currently NPO. I asked if they could do a swallow evalution . Pt was on methadone and xanax at home  EEG neg   eICU Interventions  - ok to do a bed side swallow eval, if ok to try clear liquids first and advance food as tolerated. - per tube meds changed to oral.      Intervention Category Intermediate Interventions: Other:  Ranee Gosselin 10/03/2020, 8:25 PM

## 2020-10-04 ENCOUNTER — Inpatient Hospital Stay (HOSPITAL_COMMUNITY): Payer: Medicaid Other

## 2020-10-04 DIAGNOSIS — F418 Other specified anxiety disorders: Secondary | ICD-10-CM | POA: Diagnosis not present

## 2020-10-04 DIAGNOSIS — F1123 Opioid dependence with withdrawal: Principal | ICD-10-CM

## 2020-10-04 DIAGNOSIS — R112 Nausea with vomiting, unspecified: Secondary | ICD-10-CM | POA: Diagnosis not present

## 2020-10-04 DIAGNOSIS — E876 Hypokalemia: Secondary | ICD-10-CM | POA: Diagnosis not present

## 2020-10-04 LAB — GLUCOSE, CAPILLARY
Glucose-Capillary: 91 mg/dL (ref 70–99)
Glucose-Capillary: 68 mg/dL — ABNORMAL LOW (ref 70–99)
Glucose-Capillary: 82 mg/dL (ref 70–99)
Glucose-Capillary: 98 mg/dL (ref 70–99)

## 2020-10-04 LAB — BASIC METABOLIC PANEL
Anion gap: 11 (ref 5–15)
BUN: 13 mg/dL (ref 6–20)
CO2: 24 mmol/L (ref 22–32)
Calcium: 8.5 mg/dL — ABNORMAL LOW (ref 8.9–10.3)
Chloride: 104 mmol/L (ref 98–111)
Creatinine, Ser: 0.68 mg/dL (ref 0.44–1.00)
GFR, Estimated: >60 mL/min (ref >60)
Glucose, Bld: 87 mg/dL (ref 70–99)
Potassium: 2.7 mmol/L — CL (ref 3.5–5.1)
Sodium: 139 mmol/L (ref 135–145)

## 2020-10-04 LAB — HCG, QUANTITATIVE, PREGNANCY: hCG, Beta Chain, Quant, S: 4 m[IU]/mL (ref <5)

## 2020-10-04 LAB — PHOSPHORUS: Phosphorus: 4.6 mg/dL (ref 2.5–4.6)

## 2020-10-04 LAB — MAGNESIUM: Magnesium: 2.2 mg/dL (ref 1.7–2.4)

## 2020-10-04 LAB — TRIGLYCERIDES: Triglycerides: 123 mg/dL (ref <150)

## 2020-10-04 MED ORDER — POTASSIUM CHLORIDE 10 MEQ/100ML IV SOLN
10.0000 meq | INTRAVENOUS | Status: AC
  Administered 2020-10-04 (×4): 10 meq via INTRAVENOUS
  Filled 2020-10-04 (×4): qty 100

## 2020-10-04 MED ORDER — METOPROLOL TARTRATE 25 MG PO TABS
25.0000 mg | ORAL_TABLET | Freq: Two times a day (BID) | ORAL | Status: DC
  Administered 2020-10-04 – 2020-10-05 (×3): 25 mg via ORAL
  Filled 2020-10-04 (×3): qty 1

## 2020-10-04 MED ORDER — BOOST / RESOURCE BREEZE PO LIQD CUSTOM: 1.0000 | ORAL | Status: DC

## 2020-10-04 MED ORDER — PANTOPRAZOLE SODIUM 40 MG PO TBEC
40.0000 mg | DELAYED_RELEASE_TABLET | Freq: Every day | ORAL | Status: DC
  Administered 2020-10-04: 40 mg via ORAL
  Filled 2020-10-04: qty 1

## 2020-10-04 MED ORDER — POTASSIUM CHLORIDE CRYS ER 20 MEQ PO TBCR
40.0000 meq | EXTENDED_RELEASE_TABLET | Freq: Once | ORAL | Status: AC
  Administered 2020-10-04: 40 meq via ORAL
  Filled 2020-10-04: qty 2

## 2020-10-04 NOTE — Progress Notes (Signed)
CRITICAL VALUE ALERT  Critical Value:  2.7 K+  Date & Time Notied:  10/04/2020 0355  Provider Notified: E-link  Orders Received/Actions taken: Awaiting orders.

## 2020-10-04 NOTE — Progress Notes (Signed)
eLink Physician-Brief Progress Note Patient Name: Sindi Beckworth DOB: Oct 02, 1995 MRN: [id number]   Date of Service  10/04/2020  HPI/Events of Note  K 2.7, Cr normal. Able to take oral and IV as per RN.  eICU Interventions  K replacement protocol ordered.     Intervention Category Intermediate Interventions: Electrolyte abnormality - evaluation and management  Ranee Gosselin 10/04/2020, 3:59 AM

## 2020-10-04 NOTE — Plan of Care (Signed)
Plan to move patient tonight to room 1420. Patient is alert, oriented, and calm. Patient swallowing well and no signs of distress at this time. Provided all scheduled medications and dose of prn pain medication prior to transfer.   Problem: Education: Goal: Knowledge of General Education information will improve Description: Including pain rating scale, medication(s)/side effects and non-pharmacologic comfort measures Outcome: Progressing   Problem: Health Behavior/Discharge Planning: Goal: Ability to manage health-related needs will improve Outcome: Progressing   Problem: Clinical Measurements: Goal: Ability to maintain clinical measurements within normal limits will improve Outcome: Progressing Goal: Will remain free from infection Outcome: Progressing Goal: Diagnostic test results will improve Outcome: Progressing Goal: Respiratory complications will improve Outcome: Progressing Goal: Cardiovascular complication will be avoided Outcome: Progressing   Problem: Activity: Goal: Risk for activity intolerance will decrease Outcome: Progressing   Problem: Nutrition: Goal: Adequate nutrition will be maintained Outcome: Progressing   Problem: Coping: Goal: Level of anxiety will decrease Outcome: Progressing   Problem: Elimination: Goal: Will not experience complications related to bowel motility Outcome: Progressing Goal: Will not experience complications related to urinary retention Outcome: Progressing   Problem: Pain Managment: Goal: General experience of comfort will improve Outcome: Progressing   Problem: Safety: Goal: Ability to remain free from injury will improve Outcome: Progressing   Problem: Skin Integrity: Goal: Risk for impaired skin integrity will decrease Outcome: Progressing

## 2020-10-04 NOTE — Progress Notes (Signed)
While assisting patient to the bathroom, pts HR increased to 157 bpm. Pt denies SOB and chest pain. Once this RN assisted pt back to bed, HR returned to NSR. Scheduled metoprolol given by RN per orders. This RN will continue to carefully monitor pt.

## 2020-10-04 NOTE — Plan of Care (Signed)
I met with Gabrielle Nguyen, her mother, and RN Hope at bedside to discuss her treatment options post-discharge. Her fiance and mother wish for her to go to inpatient rehab and her fiance has determined that she cannot return home unless she participates in an inpatient rehab program. She is not interested in doing this and wishes to continue her current plan of getting care at the methadone clinic 5 days per week. Her mother has voiced her concerns about her ability to afford getting her methadone, transportation to the clinic, and her friends' willingness to host her indefinitely. Gabrielle Nguyen has indicated that her plan is to try to take her methadone more regularly and has agreed to follow up closely with her Psychiatrist. She reports that she has friends who can help financially support her through the methadone clinic to help with her compliance. I have asked that her home medications be brought to her at the hospital prior to discharge as she will not be able to receive scripts for her controlled substances at discharge. They should also bring her clothes. She will receive all scheduled meds prior to discharge to ensure no missed doses due to timing. PT and OT to assess prior to discharge due to concern about her steadiness on her feet. Gabrielle Nguyen has capacity to make decisions for herself and seems to understand the risk of losing those close to her and my concern for above average risk of relapse of use of illicit medications with a very unstable home situation. She will be discharged with a small supply of nausea medication to help promote her compliance with her methadone regimen at times when she has nausea. She understands that she has to take her medications on a strict schedule to prevent withdrawl.  Julian Hy, DO 10/04/20 3:23 PM [facility]  From 7AM- 7PM if no response to pager, please call [phone]. After hours, 7PM- 7AM, please call Elink  [phone].

## 2020-10-04 NOTE — Progress Notes (Signed)
Nutrition Follow-up  DOCUMENTATION CODES:   Non-severe (moderate) malnutrition in context of social or environmental circumstances  INTERVENTION:  - will order Boost Breeze once/day, each supplement provides 250 kcal and 9 grams of protein. - will order Magic Cup BID with meals, each supplement provides 290 kcal and 9 grams of protein.  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 -ongoing  GOAL:   Patient will meet greater than or equal to 90% of their needs -progressing  MONITOR:   PO intake,Supplement acceptance,Labs,Weight trends  ASSESSMENT:   25 y.o. 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 She presented to the ED with complaints of abdominal pain and N/V that began early on 1/29. She reported running out of methadone.  Patient was discussed in rounds this AM. Also discussed patient with RN. She self-extubated yesterday evening around the time of shift change. Patient has been on room air since that time.   Diet was advanced from NPO to Regular today at 0738. RN reports that patient ate ~80% of breakfast (this would be ~635 kcal, 7 grams protein). Items ordered for breakfast were: lemonade, lemon italian ice, rice krispie treat, graham crackers, and oatmeal with brown sugar. Items ordered for lunch were similar to include hot chocolate, italian ice, sugar cookie, etc.   Ordered oral nutrition supplements outlined above that are in line with items she has ordered for meals in order to provide additional protein.   Weight stable from 1/31 to today.    Labs reviewed; CBGs: 91, 68, 82 mg/d, K: 2.7 mmol/l, Ca: 8.5 mg/dl.  Medications reviewed; 100 mg colace BID, 40 mg oral  protonix/day, 10 mEq IV KCl x4 runs 2/2, 40 mEq Klor-Con x2 doses 2/2.    Diet Order:   Diet Order            Diet regular Room service appropriate? Yes; Fluid consistency: Thin  Diet effective now                 EDUCATION NEEDS:   No education needs have been identified at this time  Skin:  Skin Assessment: Reviewed RN Assessment  Last BM:  2/2  Height:   Ht Readings from Last 1 Encounters:  10/02/20 5\' 8"  (1.727 m)    Weight:   Wt Readings from Last 1 Encounters:  10/04/20 54 kg    Estimated Nutritional Needs:  Kcal:  1800-2000 kcal Protein:  75-90 grams Fluid:  >/= 1.7 L/day      12/02/20, MS, RD, LDN, CNSC Inpatient Clinical Dietitian RD pager # available in [facility]  After hours/weekend pager # available in [facility]

## 2020-10-04 NOTE — Progress Notes (Signed)
NAME:  Gabrielle Nguyen, MRN:  [id number], DOB:  1996-01-12, LOS: 3 ADMISSION DATE:  09/30/2020, CONSULTATION DATE:  10/03/2020 REFERRING MD:  Noralee Stain MD, CHIEF COMPLAINT: Altered mental status, respiratory distress  Brief History:  25 year old with asthma, depression, anxiety, substance abuse on methadone Admitted with nausea, vomiting.  Presumed methadone withdrawal as she had not been able to keep her medications down.  History notable for multiple admissions for similar episodes.  She was started on low-dose methadone.  PCCM consulted on 2/1 for altered mental status, dyspnea Nurse notes that she probably had been taking unknown pills from her purse.  She has received 0.4 mg narcan, 0.3 mg of flumazenil without any improvement  Past Medical History:    has a past medical history of Anemia, Anxiety, Anxiety, Asthma, Depression, HPV (human papilloma virus) anogenital infection, Oppositional defiant disorder, Seasonal allergies, and Vaginal Pap smear, abnormal.  Significant Hospital Events:  1/29 Admit 2/1 Intubated  Consults:  PCCM  Procedures:  ETT 2/1 >> 2/1  Significant Diagnostic Tests:  CT abdomen pelvis 09/22/2020-prominent stool, dilated hepatic and common bile duct, normal appendix.  Chest x-ray 10/03/2020- LLL infiltrate vs atelectasis  Micro Data:  covid negative 09/30/20  Antimicrobials:  unasyn 10/03/20>  Interim History / Subjective:  Self-extubated last night. Denies nausea, anxiety this morning.   Objective   Blood pressure (!) 145/85, pulse (!) 106, temperature 98.6 F (37 C), resp. rate 12, height 5\' 8"  (1.727 m), weight 54 kg, SpO2 95 %, unknown if currently breastfeeding.    Vent Mode: PRVC FiO2 (%):  [30 %-100 %] 30 % Set Rate:  [20 bmp] 20 bmp Vt Set:  [510 mL] 510 mL PEEP:  [8 cmH20-10 cmH20] 8 cmH20 Plateau Pressure:  [14 cmH20-17 cmH20] 14 cmH20   Intake/Output Summary (Last 24 hours) at 10/04/2020 0757 Last data filed at 10/04/2020 12/02/2020 Gross  per 24 hour  Intake 2298.17 ml  Output 2230 ml  Net 68.17 ml   Filed Weights   10/02/20 0040 10/04/20 0447  Weight: 53.8 kg 54 kg    Examination: BP (!) 145/85 (BP Location: Right Arm)   Pulse (!) 106   Temp 98.6 F (37 C)   Resp 12   Ht 5\' 8"  (1.727 m)   Wt 54 kg   LMP  (LMP Unknown)   SpO2 95%   BMI 18.10 kg/m   Gen:      Healthy appearing young woman laying in bed in NAD HEENT: [place]/AT, eyes anicteric Lungs:    CTAB, breathing comfortably on RA CV:        Tachycardic, regular rhythm Abd:     Soft, ND Ext:    No peripheral edema, no cyanosis Skin:      Warm, dry Neuro:  Awake and alert, answering questions appropriately, normal speech. Moving all extremities spontaneously. Resolved Hospital Problem list   Acute metabolic encephalopathy Acute hypoxic and hypercapnic respiratory failure  Assessment & Plan:  Acute opiate withdrawal precipitating her symptoms for this admission,  +/- benzo withdrawal -con't PTA dose of methadone -con't PTA xanax dose; need to verify on PMP  Anxiety with depression -Continue PTA Zoloft -xanax at PTA dose -con't hydroxyzine PRN  Chronic anemia -transfuse for Hb <7 -con't to monitor  Hypokalemia -repleted   Hyperglycemia- resolved  HcG increased at admission> normalized -no additional intervention  Sinus tachycardia -adding metoprolol 25mg  BID  History of GERD -pantoprazole  GoC -4th admission in 6 weeks with similar symptoms per her mother, her mother thinks  all of this is drug-related. Her drug of abuse as a youth was xanax, which she has been prescribed again recently. Her mother wants her to get substance abuse treatment after discharge.     Best practice (evaluated daily)  Diet: NPO Pain/Anxiety/Delirium protocol (if indicated): Propofol, versed PRN VAP protocol (if indicated): Ordered DVT prophylaxis: Lovenox GI prophylaxis: PPI Glucose control: Monitor Mobility: Bed Disposition:Full  Goals of Care:  Last  date of multidisciplinary goals of care discussion: Pending Family and staff present: Will update her mother when she arrives this morning Summary of discussion:  Follow up goals of care discussion due: 2/7 Code Status: Full  Labs   CBC: Recent Labs  Lab 09/30/20 1410 10/01/20 0500  WBC 9.4 9.1  HGB 11.5* 9.7*  HCT 36.0 30.2*  MCV 85.5 85.8  PLT 243 226    Basic Metabolic Panel: Recent Labs  Lab 09/30/20 1410 09/30/20 1730 10/01/20 0500 10/02/20 0517 10/03/20 0110 10/04/20 0303  NA 138  --  135 135 138 139  K 3.6  --  3.4* 4.3 4.1 2.7*  CL 101  --  103 104 102 104  CO2 24  --  22 21* 27 24  GLUCOSE 117*  --  114* 90 161* 87  BUN 11  --  6 <5* 9 13  CREATININE 0.71  --  0.56 0.54 0.56 0.68  CALCIUM 9.7  --  8.9 9.1 8.8* 8.5*  MG  --  1.9  --   --   --  2.2  PHOS  --   --   --   --   --  4.6   GFR: Estimated Creatinine Clearance: 90.8 mL/min (by C-G formula based on SCr of 0.68 mg/dL). Recent Labs  Lab 09/30/20 1410 10/01/20 0500  WBC 9.4 9.1    Liver Function Tests: Recent Labs  Lab 09/30/20 1410 10/03/20 0110  AST 25 20  ALT 13 12  ALKPHOS 64 59  BILITOT 0.3 0.2*  PROT 8.5* 7.3  ALBUMIN 4.6 4.0   Recent Labs  Lab 09/30/20 1410  LIPASE 25   No results for input(s): AMMONIA in the last 168 hours.  ABG    Component Value Date/Time   PHART 7.549 (H) 10/03/2020 0753   PCO2ART 24.5 (L) 10/03/2020 0753   PO2ART 491 (H) 10/03/2020 0753   HCO3 21.4 10/03/2020 0753   TCO2 15 (L) 07/04/2020 0920   ACIDBASEDEF 0.9 10/03/2020 0418   O2SAT 100.0 10/03/2020 0753     Coagulation Profile: No results for input(s): INR, PROTIME in the last 168 hours.  Cardiac Enzymes: No results for input(s): CKTOTAL, CKMB, CKMBINDEX, TROPONINI in the last 168 hours.  HbA1C: Hgb A1c MFr Bld  Date/Time Value Ref Range Status  11/26/2019 10:38 AM 5.8 (H) 4.8 - 5.6 % Final    Comment:    (NOTE) Pre diabetes:          5.7%-6.4% Diabetes:               >6.4% Glycemic control for   <7.0% adults with diabetes     CBG: Recent Labs  Lab 10/03/20 1534 10/03/20 2023 10/03/20 2302 10/04/20 0443 10/04/20 0752  GLUCAP 84 84 108* 91 68*   This patient is critically ill with multiple organ system failure which requires frequent high complexity decision making, assessment, support, evaluation, and titration of therapies. This was completed through the application of advanced monitoring technologies and extensive interpretation of multiple databases. During this encounter critical care time was devoted  to patient care services described in this note for 41 minutes.   Steffanie Dunn, DO 10/04/20 7:57 AM [facility]  From 7AM- 7PM if no response to pager, please call [phone]. After hours, 7PM- 7AM, please call Elink  [phone].

## 2020-10-04 NOTE — Evaluation (Signed)
Physical Therapy Evaluation Patient Details Name: Gabrielle Nguyen MRN: [id number] DOB: October 03, 1995 Today's Date: 10/04/2020   History of Present Illness  25 year old with asthma, depression, anxiety, substance abuse on methadone  Admitted  1/29/22with nausea, vomiting.  Presumed methadone withdrawal as she had not been able to keep her medications down.  Clinical Impression  Patient  Initially unsteady with ambulation, min guarding required for safety while ambulating. Patient  Noted  taking small shuffle steps( Had on slide in slippers). HR up to 125, On RA with SPO2 dropped into 80's. , back to  90's once resting. Patient will benefit from increaased ambulation to improve gait and balance.Pt admitted with above diagnosis.  Pt currently with functional limitations due to the deficits listed below (see PT Problem List). Pt will benefit from skilled PT to increase their independence and safety with mobility to allow discharge to the venue listed below.       Follow Up Recommendations No PT follow up    Equipment Recommendations  None recommended by PT    Recommendations for Other Services       Precautions / Restrictions Precautions Precautions: Fall Precaution Comments: monitor HR      Mobility  Bed Mobility Overal bed mobility: Independent                  Transfers Overall transfer level: Needs assistance Equipment used: None Transfers: Sit to/from Stand Sit to Stand: Min guard         General transfer comment: stands from bed and toilet  Ambulation/Gait Ambulation/Gait assistance: Min guard Gait Distance (Feet): 50 Feet (then 20) Assistive device: None Gait Pattern/deviations: Step-through pattern;Drifts right/left;Decreased stride length Gait velocity: decr   General Gait Details: patient asked to wear  slide in slippers which may have affexcted gait, patient taking small shuffling steps.  Stairs            Wheelchair Mobility    Modified Rankin  (Stroke Patients Only)       Balance Overall balance assessment: Needs assistance Sitting-balance support: Feet supported;No upper extremity supported Sitting balance-Leahy Scale: Good     Standing balance support: During functional activity;No upper extremity supported Standing balance-Leahy Scale: Fair                               Pertinent Vitals/Pain Pain Assessment: No/denies pain    Home Living Family/patient expects to be discharged to:: Private residence Living Arrangements: Spouse/significant other;Non-relatives/Friends Available Help at Discharge: Family;Friend(s);Available 24 hours/day Type of Home: House Home Access: Stairs to enter   [employer] of Steps: 3 Home Layout: One level Home Equipment: None Additional Comments: pt. reports that she will be going to afriend's home    Prior Function Level of Independence: Independent               Hand Dominance        Extremity/Trunk Assessment   Upper Extremity Assessment Upper Extremity Assessment: Overall WFL for tasks assessed    Lower Extremity Assessment Lower Extremity Assessment: Generalized weakness    Cervical / Trunk Assessment Cervical / Trunk Assessment: Normal  Communication   Communication: No difficulties  Cognition Arousal/Alertness: Awake/alert Behavior During Therapy: WFL for tasks assessed/performed Overall Cognitive Status: No family/caregiver present to determine baseline cognitive functioning Area of Impairment: Orientation                 Orientation Level: Time  General Comments: patient focussed on texting      General Comments      Exercises     Assessment/Plan    PT Assessment Patient needs continued PT services  PT Problem List Decreased strength;Decreased balance;Decreased cognition;Decreased activity tolerance;Decreased safety awareness;Decreased knowledge of use of DME;Decreased mobility;Cardiopulmonary  status limiting activity       PT Treatment Interventions Gait training;Functional mobility training;Therapeutic activities;Therapeutic exercise;Patient/family education    PT Goals (Current goals can be found in the Care Plan section)  Acute Rehab PT Goals Patient Stated Goal: go home PT Goal Formulation: With patient Time For Goal Achievement: 10/18/20 Potential to Achieve Goals: Good    Frequency Min 3X/week   Barriers to discharge        Co-evaluation               AM-PAC PT "6 Clicks" Mobility  Outcome Measure Help needed turning from your back to your side while in a flat bed without using bedrails?: None Help needed moving from lying on your back to sitting on the side of a flat bed without using bedrails?: None Help needed moving to and from a bed to a chair (including a wheelchair)?: A Little Help needed standing up from a chair using your arms (e.g., wheelchair or bedside chair)?: A Little Help needed to walk in hospital room?: A Little Help needed climbing 3-5 steps with a railing? : A Little 6 Click Score: 20    End of Session Equipment Utilized During Treatment: Gait belt Activity Tolerance: Patient tolerated treatment well Patient left: in chair;with call bell/phone within reach;with chair alarm set Nurse Communication: Mobility status PT Visit Diagnosis: Unsteadiness on feet (R26.81);Difficulty in walking, not elsewhere classified (R26.2)    Time: 8032-1224 PT Time Calculation (min) (ACUTE ONLY): 14 min   Charges:   PT Evaluation $PT Eval Low Complexity: 1 Low          Blanchard Kelch PT Acute Rehabilitation Services Pager [phone] Office [phone]   Rada Hay 10/04/2020, 5:30 PM

## 2020-10-04 NOTE — Plan of Care (Addendum)
Desaturated when working with PT to low 80s. Will keep on supplemental O2 overnight, use IS and flutter to promote recruitement. Will transfer to med surg and reevaluate in the AM. She is still awaiting her medications from PTA to be brought to her.  Steffanie Dunn, DO 10/04/20 5:19 PM [facility]  From 7AM- 7PM if no response to pager, please call [phone]. After hours, 7PM- 7AM, please call Elink  [phone].   Allesha now is interested in pursuing behavior health for addiction treatment after discharge. Needs to have hypoxia resolved prior to medical clearance for [facility]; will reassess in AM.  Steffanie Dunn, DO 10/04/20 5:25 PM [facility]

## 2020-10-05 DIAGNOSIS — R112 Nausea with vomiting, unspecified: Secondary | ICD-10-CM | POA: Diagnosis not present

## 2020-10-05 DIAGNOSIS — F1123 Opioid dependence with withdrawal: Secondary | ICD-10-CM | POA: Diagnosis not present

## 2020-10-05 DIAGNOSIS — F1920 Other psychoactive substance dependence, uncomplicated: Secondary | ICD-10-CM

## 2020-10-05 DIAGNOSIS — F1120 Opioid dependence, uncomplicated: Secondary | ICD-10-CM | POA: Diagnosis not present

## 2020-10-05 DIAGNOSIS — E876 Hypokalemia: Secondary | ICD-10-CM | POA: Diagnosis not present

## 2020-10-05 LAB — BASIC METABOLIC PANEL
Anion gap: 11 (ref 5–15)
BUN: <5 mg/dL — ABNORMAL LOW (ref 6–20)
CO2: 25 mmol/L (ref 22–32)
Calcium: 8.6 mg/dL — ABNORMAL LOW (ref 8.9–10.3)
Chloride: 99 mmol/L (ref 98–111)
Creatinine, Ser: 0.54 mg/dL (ref 0.44–1.00)
GFR, Estimated: >60 mL/min (ref >60)
Glucose, Bld: 99 mg/dL (ref 70–99)
Potassium: 3.1 mmol/L — ABNORMAL LOW (ref 3.5–5.1)
Sodium: 135 mmol/L (ref 135–145)

## 2020-10-05 MED ORDER — PROMETHAZINE HCL 12.5 MG PO TABS: 12.5000 mg | ORAL_TABLET | Freq: Four times a day (QID) | ORAL | 0 refills | Status: DC | PRN

## 2020-10-05 MED ORDER — POTASSIUM CHLORIDE 10 MEQ/100ML IV SOLN
10.0000 meq | INTRAVENOUS | Status: AC
  Administered 2020-10-05 (×4): 10 meq via INTRAVENOUS
  Filled 2020-10-05 (×4): qty 100

## 2020-10-05 NOTE — Consult Note (Signed)
  Gabrielle Nguyen is a 25 y.o. 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  All other symptoms, body aches, chills, tremors and palpitations.  She states she has not run out of methadone.  She took a dose earlier, but had emesis afterwards.  No fever, sore throat, rhinorrhea, chest pain, dyspnea, PND, orthopnea or pitting edema of the lower extremities.  No diarrhea, constipation, melena or hematochezia.  No dysuria, frequency or hematuria.  No polyuria, polydipsia, polyphagia or blurred vision.  Psych consult placed for patient wants to do inpatient drug treatment program. Patient is seen and case discussed with Dr. Lucianne Muss. At the time of the evaluation patient reports she is no longer interested in inpatient residential program. She states she feels as though she will be successful in an outpatient setting. She reports that she has a family at home, husband and 2 children (4 and 10 months) that she doesn't want to be away from. She states that her reason for repeat admissions is due to her not having access to her methadone due to inclement, which causes her to go into withdraw. She states she now understands the importance of compliance and adherence with her treatment plan for methadone. She also has some insight as she reports suboxone may be a better option for her than methadone and would be easier for her to transition to. She is [facility]. She reports compliance with Xanax 1mg  po TID prn, Methadone 130mg  po qhs, and zoloft 150mg  po daily. PDMP review shows overdose risk score of 490/999. She is receiving Xanax 1mg  po TID #45 once a month from [facility] in [place], . She denies any recent attempts to  self harm, suicidal ideations and or suicide attempts. She denies any homicidal ideations and or auditory visual hallucinations. She is able to answer all questions appropriately, and does not appear to be responding to internal stimuli.   At this time patient does not appear to be imminent danger to self or others. She does have some insight into her addiction and appears motivated to seek treatment. At present she is declining inpatient drug abuse treatment, and vows to start outpatient services.  -Patient does not meet inpatient psychiatric admission  -She is interested in referral to [facility] through [facility]. Recommend working closely with social work to facilitate outpatient referral.  -Patient does not meet criteria for IVC at this time.  -Psychiatry to sign off.

## 2020-10-05 NOTE — Progress Notes (Signed)
SATURATION QUALIFICATIONS: (This note is used to comply with regulatory documentation for home oxygen)  Patient Saturations on Room Air at Rest = 99%  Patient Saturations on Room Air while Ambulating = 95-100%

## 2020-10-05 NOTE — Progress Notes (Signed)
OT Cancellation Note  Patient Details Name: Gabrielle Nguyen MRN: [id number] DOB: July 12, 1996   Cancelled Treatment:    Reason Eval/Treat Not Completed: OT screened, no needs identified, will sign off. Patient ambulating in nursing. Reports independence with ADLs and patient reports no needs or deficits.   Yehoshua Vitelli L Kaveri Perras 10/05/2020, 11:52 AM

## 2020-10-05 NOTE — TOC Transition Note (Addendum)
Transition of Care [facility]) - CM/SW Discharge Note   Patient Details  Name: Gabrielle Nguyen MRN: [id number] Date of Birth: 1996-03-08  Transition of Care [facility]) CM/SW Contact:  Darleene Cleaver, LCSW Phone Number: 10/05/2020, 1:06 PM   Clinical Narrative:     CSW was informed that patient is interested in outpatient substance abuse resources.  Per psych recommendation patient does not need inpatient psych care.  Patient is alert and oriented x4 and able to make her own decisions.  CSW provided Substance abuse resources in patient's AVS.  CSW signing off.   Final next level of care: Home/Self Care Barriers to Discharge: Barriers Resolved   Patient Goals and CMS Choice Patient states their goals for this hospitalization and ongoing recovery are:: To return home with husband and kids.      Discharge Placement                       Discharge Plan and Services                                     Social Determinants of Health (SDOH) Interventions     Readmission Risk Interventions No flowsheet data found.

## 2020-10-05 NOTE — Discharge Summary (Signed)
Physician Discharge Summary  Patient ID: Gabrielle Nguyen MRN: [id number] DOB/AGE: 1995/11/24 25 y.o.  Admit date: 09/30/2020 Discharge date: 10/05/2020  Admission Diagnoses: intractable nausea and vomiting due to methadone withdrawal Chronic opiate use Chronic benzodiazepine use Anxiety with depression Normocytic anemia   Discharge Diagnoses:  Principal Problem:   Intractable nausea and vomiting Active Problems:   Methadone withdrawal (HCC)   Asthma   Anxiety with depression   Normocytic anemia hypokalemia Acute respiratory failure with hypoxia hyperglycemia  Discharged Condition: stable  Hospital Course: Gabrielle Nguyen is a 25 y/o woman admitted with uncontrolled symptoms related to withdrawal from opaites and benzodiazepines. She had a panic attack and became altered to the point that she required intubation and mechanical ventilation. She self-extubated about 12 hours later and had minimal residual respiratory failure, responsive to pulmonary hygiene maneuvers. She received empiric antibiotics due to concern for aspiration, which were stopped after 1 day with CXR normalizing. She was restarted on her PTA methadone and xanax doses. Extensive counseling and discussion with her mother and fiance have been had to recommend she seek inpatient treatment for her addiction. She has refused inpatient drug rehab programs and wishes to do outpatient substance abuse treatment and continue with her methadone. Psychiatry has evaluated and helped coordinate her follow up plans. She will likely have an unstable home situation after discharge and her fiance is insisting that she seek inpatient treatment before she can return home. She still refuses and will be discharging to stay with friends. She understands that I am concerned that with an unstable home situation she will be higher risk for having worse substance abuse issues. She admits to not taking her sertraline PTA, which has been restarted and will be  continued at discharge.  No prescriptions for benzos or opiates at discharge- she has outpatient providers that have been managing these. Short prescription for phenergan sent with instructions to take with her methadone if she is having nausea when she takes it. She understands that this may make her drowsy and she should not drive after taking it.  Consults:  Psychiatry PCCM Physical therapy  Significant Diagnostic Studies:  EEG> no seizures, encephalopathy due to medications CT head> no acute findings   Treatments:   Mechanical ventilation, supplemental oxygen IVF Anti-emetics Methadone Benzodiazepines Antidepressants   Discharge Exam: Blood pressure (!) 123/92, pulse 63, temperature 97.8 F (36.6 C), temperature source Oral, resp. rate 20, height 5\' 8"  (1.727 m), weight 54 kg, SpO2 100 %, unknown if currently breastfeeding.  General: healthy appearing young woman laying in bed in NAD HEENT: [place]/AT, eyes anicteric, oral mucosa moist Resp: breathing comfortably on RA, no conversational dyspnea Abd: nondistended Extremities: no cyanosis, clubbing, or edema Skin: warm, dry, no rashes Psych: cooperative with exam    Disposition:  Discharge disposition: 01-Home or Self Care       Discharge Instructions    Diet general   Complete by: As directed    Increase activity slowly   Complete by: As directed      Allergies as of 10/05/2020   No Known Allergies     Medication List    TAKE these medications   ALPRAZolam 1 MG tablet Commonly known as: XANAX Take 1 mg by mouth 2 (two) times daily as needed for anxiety.   APAP 325 MG tablet Take 2 tablets (650 mg total) by mouth every 6 (six) hours as needed for mild pain or headache.   methadone 10 MG/ML solution Commonly known as: DOLOPHINE Take 130 mg  by mouth daily. Verified with on call personnel for [facility] [place],Gabrielle Nguyen. Last dose was 08-23-20   prazosin 1 MG capsule Commonly known  as: MINIPRESS Take 1 mg by mouth at bedtime.   promethazine 12.5 MG tablet Commonly known as: PHENERGAN Take 1 tablet (12.5 mg total) by mouth every 6 (six) hours as needed for nausea or vomiting.   sertraline 100 MG tablet Commonly known as: ZOLOFT Take 150 mg by mouth daily.       Follow-up Information    [facility] Follow up.   Specialty: Behavioral Health Why: Chemical dependency intensive outpatient program  Contact information: [address] [place] [address] [phone]              Signed: Steffanie Nguyen 10/05/2020, 12:19 PM

## 2020-12-14 ENCOUNTER — Other Ambulatory Visit: Payer: Self-pay

## 2020-12-14 ENCOUNTER — Emergency Department (HOSPITAL_COMMUNITY)
Admission: EM | Admit: 2020-12-14 | Discharge: 2020-12-14 | Disposition: A | Discharge status: Home / Self Care | Payer: Medicaid Other | Attending: Emergency Medicine | Admitting: Emergency Medicine

## 2020-12-14 DIAGNOSIS — O99321 Drug use complicating pregnancy, first trimester: Secondary | ICD-10-CM | POA: Insufficient documentation

## 2020-12-14 DIAGNOSIS — Z3A10 10 weeks gestation of pregnancy: Secondary | ICD-10-CM | POA: Insufficient documentation

## 2020-12-14 DIAGNOSIS — F1320 Sedative, hypnotic or anxiolytic dependence, uncomplicated: Secondary | ICD-10-CM | POA: Insufficient documentation

## 2020-12-14 DIAGNOSIS — Z3490 Encounter for supervision of normal pregnancy, unspecified, unspecified trimester: Secondary | ICD-10-CM

## 2020-12-14 DIAGNOSIS — F1190 Opioid use, unspecified, uncomplicated: Secondary | ICD-10-CM | POA: Diagnosis not present

## 2020-12-14 DIAGNOSIS — R61 Generalized hyperhidrosis: Secondary | ICD-10-CM | POA: Insufficient documentation

## 2020-12-14 DIAGNOSIS — Z87891 Personal history of nicotine dependence: Secondary | ICD-10-CM | POA: Diagnosis not present

## 2020-12-14 DIAGNOSIS — R000 Tachycardia, unspecified: Secondary | ICD-10-CM | POA: Diagnosis not present

## 2020-12-14 DIAGNOSIS — R8271 Bacteriuria: Secondary | ICD-10-CM

## 2020-12-14 DIAGNOSIS — O9A211 Injury, poisoning and certain other consequences of external causes complicating pregnancy, first trimester: Secondary | ICD-10-CM | POA: Insufficient documentation

## 2020-12-14 DIAGNOSIS — F419 Anxiety disorder, unspecified: Secondary | ICD-10-CM | POA: Diagnosis not present

## 2020-12-14 DIAGNOSIS — R112 Nausea with vomiting, unspecified: Secondary | ICD-10-CM | POA: Diagnosis not present

## 2020-12-14 DIAGNOSIS — S40812A Abrasion of left upper arm, initial encounter: Secondary | ICD-10-CM | POA: Diagnosis not present

## 2020-12-14 DIAGNOSIS — Z79899 Other long term (current) drug therapy: Secondary | ICD-10-CM

## 2020-12-14 DIAGNOSIS — J45909 Unspecified asthma, uncomplicated: Secondary | ICD-10-CM | POA: Diagnosis not present

## 2020-12-14 LAB — CBC
HCT: 32.8 % — ABNORMAL LOW (ref 36.0–46.0)
Hemoglobin: 10.2 g/dL — ABNORMAL LOW (ref 12.0–15.0)
MCH: 26.4 pg (ref 26.0–34.0)
MCHC: 31.1 g/dL (ref 30.0–36.0)
MCV: 84.8 fL (ref 80.0–100.0)
Platelets: 255 10*3/uL (ref 150–400)
RBC: 3.87 MIL/uL (ref 3.87–5.11)
RDW: 17.2 % — ABNORMAL HIGH (ref 11.5–15.5)
WBC: 7.5 10*3/uL (ref 4.0–10.5)
nRBC: 0 % (ref 0.0–0.2)

## 2020-12-14 LAB — COMPREHENSIVE METABOLIC PANEL
ALT: 10 U/L (ref 0–44)
AST: 17 U/L (ref 15–41)
Albumin: 4.1 g/dL (ref 3.5–5.0)
Alkaline Phosphatase: 79 U/L (ref 38–126)
Anion gap: 9 (ref 5–15)
BUN: 9 mg/dL (ref 6–20)
CO2: 24 mmol/L (ref 22–32)
Calcium: 9.6 mg/dL (ref 8.9–10.3)
Chloride: 107 mmol/L (ref 98–111)
Creatinine, Ser: 0.68 mg/dL (ref 0.44–1.00)
GFR, Estimated: >60 mL/min (ref >60)
Glucose, Bld: 90 mg/dL (ref 70–99)
Potassium: 3.4 mmol/L — ABNORMAL LOW (ref 3.5–5.1)
Sodium: 140 mmol/L (ref 135–145)
Total Bilirubin: 0.3 mg/dL (ref 0.3–1.2)
Total Protein: 8.1 g/dL (ref 6.5–8.1)

## 2020-12-14 LAB — RAPID URINE DRUG SCREEN, HOSP PERFORMED
Amphetamines: NOT DETECTED
Barbiturates: NOT DETECTED
Benzodiazepines: POSITIVE — AB
Cocaine: NOT DETECTED
Opiates: POSITIVE — AB
Tetrahydrocannabinol: NOT DETECTED

## 2020-12-14 LAB — MAGNESIUM: Magnesium: 1.9 mg/dL (ref 1.7–2.4)

## 2020-12-14 LAB — URINALYSIS, ROUTINE W REFLEX MICROSCOPIC
Bilirubin Urine: NEGATIVE
Glucose, UA: NEGATIVE mg/dL
Hgb urine dipstick: NEGATIVE
Ketones, ur: 5 mg/dL — AB
Nitrite: NEGATIVE
Protein, ur: NEGATIVE mg/dL
Specific Gravity, Urine: 1.016 (ref 1.005–1.030)
pH: 5 (ref 5.0–8.0)

## 2020-12-14 LAB — LACTIC ACID, PLASMA: Lactic Acid, Venous: 1 mmol/L (ref 0.5–1.9)

## 2020-12-14 LAB — ETHANOL: Alcohol, Ethyl (B): <10 mg/dL (ref <10)

## 2020-12-14 LAB — I-STAT BETA HCG BLOOD, ED (MC, WL, AP ONLY): I-stat hCG, quantitative: >2000 m[IU]/mL — ABNORMAL HIGH (ref <5)

## 2020-12-14 LAB — HCG, QUANTITATIVE, PREGNANCY: hCG, Beta Chain, Quant, S: 13403 m[IU]/mL — ABNORMAL HIGH (ref <5)

## 2020-12-14 LAB — LIPASE, BLOOD: Lipase: 23 U/L (ref 11–51)

## 2020-12-14 MED ORDER — DIPHENHYDRAMINE HCL 50 MG/ML IJ SOLN
25.0000 mg | Freq: Once | INTRAMUSCULAR | Status: AC
  Administered 2020-12-14: 25 mg via INTRAVENOUS
  Filled 2020-12-14: qty 1

## 2020-12-14 MED ORDER — DIPHENHYDRAMINE HCL 50 MG/ML IJ SOLN
25.0000 mg | Freq: Once | INTRAMUSCULAR | Status: DC
  Filled 2020-12-14: qty 1

## 2020-12-14 MED ORDER — LACTATED RINGERS IV BOLUS
1000.0000 mL | Freq: Once | INTRAVENOUS | Status: AC
  Administered 2020-12-14: 1000 mL via INTRAVENOUS

## 2020-12-14 MED ORDER — METOCLOPRAMIDE HCL 5 MG/ML IJ SOLN
10.0000 mg | Freq: Once | INTRAMUSCULAR | Status: AC
  Administered 2020-12-14: 10 mg via INTRAVENOUS
  Filled 2020-12-14: qty 2

## 2020-12-14 MED ORDER — SODIUM CHLORIDE 0.9 % IV BOLUS
1000.0000 mL | Freq: Once | INTRAVENOUS | Status: AC
  Administered 2020-12-14: 1000 mL via INTRAVENOUS

## 2020-12-14 MED ORDER — ALPRAZOLAM 0.5 MG PO TABS
1.0000 mg | ORAL_TABLET | Freq: Once | ORAL | Status: AC
  Administered 2020-12-14: 1 mg via ORAL
  Filled 2020-12-14: qty 2

## 2020-12-14 MED ORDER — FOSFOMYCIN TROMETHAMINE 3 G PO PACK
3.0000 g | PACK | Freq: Once | ORAL | Status: AC
  Administered 2020-12-14: 3 g via ORAL
  Filled 2020-12-14 (×2): qty 3

## 2020-12-14 MED ORDER — ONDANSETRON HCL 4 MG/2ML IJ SOLN: 4.0000 mg | Freq: Once | INTRAMUSCULAR | Status: DC

## 2020-12-14 NOTE — ED Triage Notes (Signed)
Patient reports Opiate relapse tonight, after not having medications. Patient is [redacted] weeks pregnant.

## 2020-12-14 NOTE — ED Notes (Signed)
Patient is on the bed pan again trying to void.

## 2020-12-14 NOTE — ED Notes (Signed)
Patient was given discharge instruction. Patient is getting dressed.

## 2020-12-14 NOTE — ED Notes (Signed)
Patient has tremors while sitting bed. Patient declined benadryl.

## 2020-12-14 NOTE — ED Notes (Signed)
Patient ambulated to the bathroom with assistance.

## 2020-12-14 NOTE — Discharge Instructions (Addendum)
Thank you for allowing me to care for you today in the Emergency Department.   Your labs today were reassuring.  Please resume your home methadone.  You were given your home dose of Xanax in the emergency department.  Follow-up with your OB/GYN.  Return to the emergency department for new or worsening symptoms.
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
## 2020-12-22 ENCOUNTER — Other Ambulatory Visit (HOSPITAL_BASED_OUTPATIENT_CLINIC_OR_DEPARTMENT_OTHER): Payer: Self-pay

## 2020-12-29 ENCOUNTER — Other Ambulatory Visit: Payer: Self-pay

## 2020-12-29 ENCOUNTER — Encounter (HOSPITAL_COMMUNITY): Payer: Self-pay | Admitting: Obstetrics and Gynecology

## 2020-12-29 ENCOUNTER — Inpatient Hospital Stay (HOSPITAL_COMMUNITY): Payer: Medicaid Other

## 2020-12-29 ENCOUNTER — Inpatient Hospital Stay (HOSPITAL_COMMUNITY)
Admission: AD | Admit: 2020-12-29 | Discharge: 2021-01-02 | DRG: 897 | Disposition: A | Discharge status: Home / Self Care | Payer: Medicaid Other | Attending: Family Medicine | Admitting: Family Medicine

## 2020-12-29 DIAGNOSIS — O99321 Drug use complicating pregnancy, first trimester: Secondary | ICD-10-CM | POA: Diagnosis not present

## 2020-12-29 DIAGNOSIS — Z87891 Personal history of nicotine dependence: Secondary | ICD-10-CM

## 2020-12-29 DIAGNOSIS — Z818 Family history of other mental and behavioral disorders: Secondary | ICD-10-CM

## 2020-12-29 DIAGNOSIS — Z813 Family history of other psychoactive substance abuse and dependence: Secondary | ICD-10-CM

## 2020-12-29 DIAGNOSIS — Z20822 Contact with and (suspected) exposure to covid-19: Secondary | ICD-10-CM | POA: Diagnosis present

## 2020-12-29 DIAGNOSIS — O219 Vomiting of pregnancy, unspecified: Secondary | ICD-10-CM | POA: Diagnosis not present

## 2020-12-29 DIAGNOSIS — F1123 Opioid dependence with withdrawal: Principal | ICD-10-CM | POA: Diagnosis present

## 2020-12-29 DIAGNOSIS — Z3A08 8 weeks gestation of pregnancy: Secondary | ICD-10-CM

## 2020-12-29 DIAGNOSIS — R112 Nausea with vomiting, unspecified: Secondary | ICD-10-CM | POA: Diagnosis present

## 2020-12-29 DIAGNOSIS — F1193 Opioid use, unspecified with withdrawal: Secondary | ICD-10-CM

## 2020-12-29 DIAGNOSIS — J45909 Unspecified asthma, uncomplicated: Secondary | ICD-10-CM | POA: Diagnosis present

## 2020-12-29 DIAGNOSIS — F1120 Opioid dependence, uncomplicated: Secondary | ICD-10-CM | POA: Diagnosis present

## 2020-12-29 DIAGNOSIS — F418 Other specified anxiety disorders: Secondary | ICD-10-CM | POA: Diagnosis present

## 2020-12-29 DIAGNOSIS — F4310 Post-traumatic stress disorder, unspecified: Secondary | ICD-10-CM | POA: Diagnosis present

## 2020-12-29 DIAGNOSIS — O99342 Other mental disorders complicating pregnancy, second trimester: Secondary | ICD-10-CM | POA: Diagnosis present

## 2020-12-29 DIAGNOSIS — E876 Hypokalemia: Secondary | ICD-10-CM | POA: Diagnosis present

## 2020-12-29 DIAGNOSIS — F410 Panic disorder [episodic paroxysmal anxiety] without agoraphobia: Secondary | ICD-10-CM | POA: Diagnosis present

## 2020-12-29 DIAGNOSIS — O99512 Diseases of the respiratory system complicating pregnancy, second trimester: Secondary | ICD-10-CM | POA: Diagnosis present

## 2020-12-29 DIAGNOSIS — O26899 Other specified pregnancy related conditions, unspecified trimester: Secondary | ICD-10-CM

## 2020-12-29 DIAGNOSIS — F1190 Opioid use, unspecified, uncomplicated: Secondary | ICD-10-CM | POA: Diagnosis present

## 2020-12-29 DIAGNOSIS — F913 Oppositional defiant disorder: Secondary | ICD-10-CM | POA: Diagnosis present

## 2020-12-29 DIAGNOSIS — R825 Elevated urine levels of drugs, medicaments and biological substances: Secondary | ICD-10-CM

## 2020-12-29 DIAGNOSIS — F32A Depression, unspecified: Secondary | ICD-10-CM | POA: Diagnosis present

## 2020-12-29 DIAGNOSIS — Z79899 Other long term (current) drug therapy: Secondary | ICD-10-CM

## 2020-12-29 DIAGNOSIS — R109 Unspecified abdominal pain: Secondary | ICD-10-CM

## 2020-12-29 LAB — COMPREHENSIVE METABOLIC PANEL
ALT: 9 U/L (ref 0–44)
AST: 17 U/L (ref 15–41)
Albumin: 4.1 g/dL (ref 3.5–5.0)
Alkaline Phosphatase: 72 U/L (ref 38–126)
Anion gap: 12 (ref 5–15)
BUN: 8 mg/dL (ref 6–20)
CO2: 22 mmol/L (ref 22–32)
Calcium: 9.5 mg/dL (ref 8.9–10.3)
Chloride: 102 mmol/L (ref 98–111)
Creatinine, Ser: 0.57 mg/dL (ref 0.44–1.00)
GFR, Estimated: >60 mL/min (ref >60)
Glucose, Bld: 112 mg/dL — ABNORMAL HIGH (ref 70–99)
Potassium: 3.5 mmol/L (ref 3.5–5.1)
Sodium: 136 mmol/L (ref 135–145)
Total Bilirubin: 0.4 mg/dL (ref 0.3–1.2)
Total Protein: 7.8 g/dL (ref 6.5–8.1)

## 2020-12-29 LAB — RAPID URINE DRUG SCREEN, HOSP PERFORMED
Amphetamines: NOT DETECTED
Barbiturates: NOT DETECTED
Benzodiazepines: POSITIVE — AB
Cocaine: NOT DETECTED
Opiates: POSITIVE — AB
Tetrahydrocannabinol: POSITIVE — AB

## 2020-12-29 LAB — URINALYSIS, MICROSCOPIC (REFLEX): WBC, UA: >50 WBC/hpf (ref 0–5)

## 2020-12-29 LAB — URINALYSIS, ROUTINE W REFLEX MICROSCOPIC
Bilirubin Urine: NEGATIVE
Glucose, UA: NEGATIVE mg/dL
Hgb urine dipstick: NEGATIVE
Ketones, ur: 15 mg/dL — AB
Nitrite: NEGATIVE
Protein, ur: NEGATIVE mg/dL
Specific Gravity, Urine: >1.03 — ABNORMAL HIGH (ref 1.005–1.030)
pH: 6 (ref 5.0–8.0)

## 2020-12-29 LAB — CBC
HCT: 34.7 % — ABNORMAL LOW (ref 36.0–46.0)
Hemoglobin: 10.9 g/dL — ABNORMAL LOW (ref 12.0–15.0)
MCH: 25.8 pg — ABNORMAL LOW (ref 26.0–34.0)
MCHC: 31.4 g/dL (ref 30.0–36.0)
MCV: 82.2 fL (ref 80.0–100.0)
Platelets: 333 10*3/uL (ref 150–400)
RBC: 4.22 MIL/uL (ref 3.87–5.11)
RDW: 16.4 % — ABNORMAL HIGH (ref 11.5–15.5)
WBC: 11.8 10*3/uL — ABNORMAL HIGH (ref 4.0–10.5)
nRBC: 0 % (ref 0.0–0.2)

## 2020-12-29 MED ORDER — LACTATED RINGERS IV BOLUS
1000.0000 mL | Freq: Once | INTRAVENOUS | Status: AC
  Administered 2020-12-29: 1000 mL via INTRAVENOUS

## 2020-12-29 MED ORDER — GLYCOPYRROLATE 0.2 MG/ML IJ SOLN
0.1000 mg | Freq: Once | INTRAMUSCULAR | Status: AC
  Administered 2020-12-29: 0.1 mg via INTRAVENOUS
  Filled 2020-12-29: qty 1

## 2020-12-29 MED ORDER — METHADONE HCL 10 MG/ML PO CONC
130.0000 mg | Freq: Once | ORAL | Status: AC
  Administered 2020-12-29: 10 mg via ORAL
  Filled 2020-12-29: qty 13

## 2020-12-29 MED ORDER — ONDANSETRON HCL 4 MG/2ML IJ SOLN
4.0000 mg | Freq: Once | INTRAMUSCULAR | Status: AC
  Administered 2020-12-29: 4 mg via INTRAVENOUS
  Filled 2020-12-29: qty 2

## 2020-12-29 MED ORDER — SODIUM CHLORIDE 0.9 % IV SOLN
25.0000 mg | Freq: Once | INTRAVENOUS | Status: AC
  Administered 2020-12-29: 25 mg via INTRAVENOUS
  Filled 2020-12-29: qty 1

## 2020-12-29 MED ORDER — METHADONE HCL 10 MG/ML IJ SOLN
65.0000 mg | Freq: Once | INTRAMUSCULAR | Status: DC
  Filled 2020-12-29: qty 6.5

## 2020-12-29 MED ORDER — METHADONE HCL 10 MG/ML IJ SOLN
65.0000 mg | Freq: Once | INTRAMUSCULAR | Status: AC
  Administered 2020-12-29: 65 mg via INTRAVENOUS
  Filled 2020-12-29: qty 6.5

## 2020-12-29 MED ORDER — METHADONE HCL 10 MG/ML PO CONC
120.0000 mg | Freq: Once | ORAL | Status: AC
Start: 2020-12-29 — End: 2020-12-29
  Administered 2020-12-29: 20 mg via ORAL
  Filled 2020-12-29: qty 12

## 2020-12-29 MED ORDER — LORAZEPAM 2 MG/ML IJ SOLN
1.0000 mg | Freq: Once | INTRAMUSCULAR | Status: AC
  Administered 2020-12-29: 1 mg via INTRAVENOUS
  Filled 2020-12-29: qty 1
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
## 2020-12-29 NOTE — MAU Note (Signed)
While assisting the patient up to the restroom, RN asked patient when the last time she used Heroine and the patient stated, "last night." Patient then stated, "I was clean for over a year but started using about a week ago." The patient then stated, "I started using because of my dad. I have endometriosis and the medications they gave me were not working. So my dad came over and put the white stuff on the counter and said for me to do it and it would help with my pain."   Patient stated she was unable to get her methadone from the clinic as they were closed, "so I caved."  CNM notified.

## 2020-12-29 NOTE — MAU Note (Signed)
Gabrielle Nguyen is a 24 y.o. here in MAU reporting:pt arrived via EMS complaining of nausea and vomiting that started today. Also endorses abdominal pain. No bleeding or discharge. Pt states she is withdrawing.   LMP: unknown  Onset of complaint: today  Pain score: 5/10  Lab orders placed from triage: UA

## 2020-12-29 NOTE — Progress Notes (Signed)
Called report to [facility] RN CN in [place] ED. Explained pt had IV Methadone tonight that was sent from [facility]. 200mg  vial sent and pt had 65mg  tonight. The remainder is in the main pharmacy.

## 2020-12-29 NOTE — Progress Notes (Signed)
PT agrees to go to ED for further evalution

## 2020-12-29 NOTE — Progress Notes (Signed)
Patient is still feeling bad and acute vomiting and shaking, not better. Concerned about ongoing severe withdrawal symptoms Called Dr. Particia Nearing in [facility] ER to transfer the patient, they accepted. Will transfer her as soon as possible.   Jaynie Collins, MD, FACOG Obstetrician & Gynecologist, [facility] for [employer], [facility]

## 2020-12-29 NOTE — Progress Notes (Signed)
    Faculty Practice OB/GYN Attending Note  Patient with continued severe withdrawal symptoms, not taking oral methadone dose.  IV Methadone ordered by Pharmacy, awaiting its arrival from [facility]. Given possible need for inpatient detoxification/observation, I consulted the [facility] service.  Patient has a history of multiple admissions to the [facility] service over the last few months, had symptoms severe enough to require intubation during her last admission in 10/2020.    Discussed patient with Dr. Darreld Mclean [facility] Attending).  He recommended that if patient is not improving, she needs to be sent to the [facility] ED as they have acute withdrawal protocols that they follow.  If ED evaluation reveals she needs inpatient observation/treatment, the ED providers will contact the [facility] service accordingly.  I communicated this plan to the patient's MAU provider Thressa Sheller, DNP).  Patient is has an 8 weeks intrauterine pregnancy documented on ultrasound today, and has no other obstetric issues. No further fetal monitoring is indicated.  If she is admitted by [facility] service, we will follow along and will always be available for any questions or concerns pertaining to her early pregnancy.  I appreciate the recommendations of Dr. Allena Katz, and the ongoing care of this patient by Dr. Mathews Robinsons, the MAU nursing team and our Pharmacy specialists.   Jaynie Collins, MD, FACOG Obstetrician & Gynecologist, [facility] for [employer], [facility] [phone]

## 2020-12-29 NOTE — MAU Note (Signed)
While administering patients IV medication, patient states, "I just want to be done. I don't want to do this anymore."   RN asked patient, "What is it that you don't want to do anymore?" Patient stated, "Drugs, all of the bad stuff. I am so sorry."  RN assured patient she was glad she was here for help and is proud of the patient for seeking help.

## 2020-12-29 NOTE — MAU Note (Signed)
Abdominal U/S attempted at bedside by Tresa Endo. Patient unable to lie down long. Exam ended. Ultrasonographer will return at a later time to perform U/S. CNM notified.

## 2020-12-29 NOTE — Progress Notes (Signed)
Pt to transferred to [facility] ED via w/c by Page Spiro RN [facility] for [facility]

## 2020-12-29 NOTE — MAU Note (Signed)
Sue Lush, pharmacist from [facility] pharmacy took the 9 syringes of Methadone and delivered one syringe of 65mg  Methadone for IV use for pt.

## 2020-12-29 NOTE — MAU Note (Signed)
Pt vomited just before Methadone given. Vomiting stopped shortly as Methadone given. During slow push of Methadone pt stated she could feel it working some.

## 2020-12-29 NOTE — MAU Note (Addendum)
On handoff to oncoming nurse, RN noticed one of the vials was filled to 0.6 mL and not 1 mL. Spillage noted in bag. RN notified Marylene Land in [facility]. Sue Lush stated to write a progress note on what occurred and have two nurses as a witness.   This RN and Quintella Baton, RN witnessed encounter and waste of medication. Methadone placed into [facility] in MAU med room.
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
## 2020-12-29 NOTE — MAU Note (Signed)
Spoke with Sue Lush in Pharmacy regarding the 100 mg of Methadone syringes that we still have on the unit as the patient only tolerated 30mg . stated when pharmacy brings the IV Methadone, that pharmacy will also retrieve the remaining Methadone. Charge Nurse notified.

## 2020-12-29 NOTE — ED Triage Notes (Signed)
Patient here from MAU. Patient A&Ox4, but presents as lethargic. Able to follow commands. VSS, IV access available and accompanied with bolus. Pt reports ongoing nausea and abdominal pain. On room air with no other complaints at this time.

## 2020-12-29 NOTE — MAU Note (Signed)
Pt able to walk to BR and void. Bed changed and pt ambulated back to bed. Not shaking now and states she feels anxious but her nausea is better and bones not aching as bad as before. Ice to pt per request

## 2020-12-30 DIAGNOSIS — R825 Elevated urine levels of drugs, medicaments and biological substances: Secondary | ICD-10-CM

## 2020-12-30 DIAGNOSIS — R112 Nausea with vomiting, unspecified: Secondary | ICD-10-CM | POA: Diagnosis not present

## 2020-12-30 DIAGNOSIS — F1129 Opioid dependence with unspecified opioid-induced disorder: Secondary | ICD-10-CM

## 2020-12-30 DIAGNOSIS — F1190 Opioid use, unspecified, uncomplicated: Secondary | ICD-10-CM | POA: Diagnosis present

## 2020-12-30 DIAGNOSIS — Z3A08 8 weeks gestation of pregnancy: Secondary | ICD-10-CM | POA: Diagnosis not present

## 2020-12-30 DIAGNOSIS — F410 Panic disorder [episodic paroxysmal anxiety] without agoraphobia: Secondary | ICD-10-CM

## 2020-12-30 DIAGNOSIS — F418 Other specified anxiety disorders: Secondary | ICD-10-CM | POA: Diagnosis not present

## 2020-12-30 DIAGNOSIS — F1120 Opioid dependence, uncomplicated: Secondary | ICD-10-CM | POA: Diagnosis present

## 2020-12-30 LAB — CBC
HCT: 30.7 % — ABNORMAL LOW (ref 36.0–46.0)
Hemoglobin: 9.9 g/dL — ABNORMAL LOW (ref 12.0–15.0)
MCH: 25.8 pg — ABNORMAL LOW (ref 26.0–34.0)
MCHC: 32.2 g/dL (ref 30.0–36.0)
MCV: 79.9 fL — ABNORMAL LOW (ref 80.0–100.0)
Platelets: 262 10*3/uL (ref 150–400)
RBC: 3.84 MIL/uL — ABNORMAL LOW (ref 3.87–5.11)
RDW: 16.4 % — ABNORMAL HIGH (ref 11.5–15.5)
WBC: 9.5 10*3/uL (ref 4.0–10.5)
nRBC: 0 % (ref 0.0–0.2)

## 2020-12-30 LAB — BASIC METABOLIC PANEL
Anion gap: 14 (ref 5–15)
BUN: <5 mg/dL — ABNORMAL LOW (ref 6–20)
CO2: 24 mmol/L (ref 22–32)
Calcium: 9.6 mg/dL (ref 8.9–10.3)
Chloride: 96 mmol/L — ABNORMAL LOW (ref 98–111)
Creatinine, Ser: 0.59 mg/dL (ref 0.44–1.00)
GFR, Estimated: >60 mL/min (ref >60)
Glucose, Bld: 89 mg/dL (ref 70–99)
Potassium: 3.2 mmol/L — ABNORMAL LOW (ref 3.5–5.1)
Sodium: 134 mmol/L — ABNORMAL LOW (ref 135–145)

## 2020-12-30 LAB — SARS CORONAVIRUS 2 (TAT 6-24 HRS): SARS Coronavirus 2: NEGATIVE

## 2020-12-30 LAB — MRSA PCR SCREENING: MRSA by PCR: NEGATIVE

## 2020-12-30 LAB — HIV ANTIBODY (ROUTINE TESTING W REFLEX): HIV Screen 4th Generation wRfx: NONREACTIVE

## 2020-12-30 MED ORDER — SODIUM CHLORIDE 0.9 % IV SOLN
25.0000 mg | Freq: Four times a day (QID) | INTRAVENOUS | Status: DC | PRN
  Administered 2020-12-30 – 2021-01-01 (×7): 25 mg via INTRAVENOUS
  Filled 2020-12-30 (×7): qty 1

## 2020-12-30 MED ORDER — PROMETHAZINE HCL 25 MG PO TABS: 12.5000 mg | ORAL_TABLET | Freq: Four times a day (QID) | ORAL | Status: DC | PRN

## 2020-12-30 MED ORDER — ENOXAPARIN SODIUM 40 MG/0.4ML IJ SOSY
40.0000 mg | PREFILLED_SYRINGE | INTRAMUSCULAR | Status: DC
  Administered 2020-12-30: 40 mg via SUBCUTANEOUS
  Filled 2020-12-30 (×2): qty 0.4

## 2020-12-30 MED ORDER — METOCLOPRAMIDE HCL 5 MG/ML IJ SOLN
7.5000 mg | Freq: Once | INTRAMUSCULAR | Status: AC
  Administered 2020-12-30: 7.5 mg via INTRAVENOUS
  Filled 2020-12-30: qty 2

## 2020-12-30 MED ORDER — METHADONE HCL 10 MG PO TABS: 130.0000 mg | ORAL_TABLET | Freq: Every day | ORAL | Status: DC

## 2020-12-30 MED ORDER — ONDANSETRON 4 MG PO TBDP
4.0000 mg | ORAL_TABLET | Freq: Three times a day (TID) | ORAL | Status: DC | PRN
  Administered 2020-12-30 – 2020-12-31 (×2): 4 mg via ORAL
  Filled 2020-12-30 (×2): qty 1

## 2020-12-30 MED ORDER — METHADONE HCL 10 MG PO TABS: 65.0000 mg | ORAL_TABLET | Freq: Every day | ORAL | Status: DC

## 2020-12-30 MED ORDER — METOPROLOL TARTRATE 5 MG/5ML IV SOLN
2.5000 mg | INTRAVENOUS | Status: DC | PRN
  Administered 2020-12-30: 2.5 mg via INTRAVENOUS
  Filled 2020-12-30: qty 5

## 2020-12-30 MED ORDER — METHADONE HCL 10 MG PO TABS
65.0000 mg | ORAL_TABLET | Freq: Every day | ORAL | Status: AC
Start: 2020-12-30 — End: 2020-12-30
  Administered 2020-12-30: 65 mg via ORAL
  Filled 2020-12-30: qty 7

## 2020-12-30 MED ORDER — ACETAMINOPHEN 650 MG RE SUPP: 650.0000 mg | Freq: Four times a day (QID) | RECTAL | Status: DC | PRN

## 2020-12-30 MED ORDER — POTASSIUM CHLORIDE IN NACL 20-0.9 MEQ/L-% IV SOLN
INTRAVENOUS | Status: DC
  Filled 2020-12-30 (×3): qty 1000

## 2020-12-30 MED ORDER — PANTOPRAZOLE SODIUM 40 MG IV SOLR
40.0000 mg | INTRAVENOUS | Status: DC
  Administered 2020-12-30 – 2021-01-02 (×4): 40 mg via INTRAVENOUS
  Filled 2020-12-30 (×4): qty 40

## 2020-12-30 MED ORDER — PRAZOSIN HCL 1 MG PO CAPS
1.0000 mg | ORAL_CAPSULE | Freq: Every day | ORAL | Status: DC
  Administered 2021-01-01: 1 mg via ORAL
  Filled 2020-12-30 (×5): qty 1

## 2020-12-30 MED ORDER — ALPRAZOLAM 0.5 MG PO TABS
1.0000 mg | ORAL_TABLET | Freq: Two times a day (BID) | ORAL | Status: DC | PRN
  Administered 2020-12-30 – 2021-01-02 (×7): 1 mg via ORAL
  Filled 2020-12-30 (×8): qty 2

## 2020-12-30 MED ORDER — ALBUTEROL SULFATE HFA 108 (90 BASE) MCG/ACT IN AERS: 1.0000 | INHALATION_SPRAY | Freq: Four times a day (QID) | RESPIRATORY_TRACT | Status: DC | PRN

## 2020-12-30 MED ORDER — METHADONE HCL 10 MG PO TABS
65.0000 mg | ORAL_TABLET | Freq: Every day | ORAL | Status: DC
  Administered 2020-12-30: 65 mg via ORAL
  Filled 2020-12-30: qty 7

## 2020-12-30 MED ORDER — SODIUM CHLORIDE 0.9 % IV SOLN: INTRAVENOUS | Status: DC

## 2020-12-30 MED ORDER — METHADONE HCL 10 MG PO TABS: 60.0000 mg | ORAL_TABLET | Freq: Every day | ORAL | Status: DC

## 2020-12-30 MED ORDER — SODIUM CHLORIDE 0.9 % IV BOLUS
250.0000 mL | Freq: Once | INTRAVENOUS | Status: AC
  Administered 2020-12-30: 250 mL via INTRAVENOUS

## 2020-12-30 MED ORDER — ACETAMINOPHEN 325 MG PO TABS: 650.0000 mg | ORAL_TABLET | Freq: Four times a day (QID) | ORAL | Status: DC | PRN

## 2020-12-30 MED ORDER — SERTRALINE HCL 25 MG PO TABS
150.0000 mg | ORAL_TABLET | Freq: Every day | ORAL | Status: DC
  Administered 2020-12-30 – 2021-01-02 (×2): 150 mg via ORAL
  Filled 2020-12-30 (×3): qty 2

## 2020-12-30 MED ORDER — ORAL CARE MOUTH RINSE
15.0000 mL | Freq: Two times a day (BID) | OROMUCOSAL | Status: DC
  Administered 2020-12-30 – 2021-01-02 (×7): 15 mL via OROMUCOSAL

## 2020-12-30 NOTE — Progress Notes (Signed)
TRH night shift.  The nursing staff reports that the patient continues to have emesis despite receiving promethazine earlier and would like to have something else for nausea.  Metoclopramide 7.5 mg IVP x1 ordered.  I have also added normal saline plus KCl 20 mEq at 100 mL/h, pantoprazole 40 mg IVP daily and metoprolol tartrate 2.5 mg IVP every 4 hours as needed for tachycardia or SBP > 139 mmHg.  Sanda Klein, MD.

## 2020-12-30 NOTE — Progress Notes (Addendum)
25 year old white female community dwelling G4 P2-0-1-2 10 weeks? Bipolar, oppositional defiant disorder Opiate use disorder on methadone Xanax-has in the past declined inpatient drug rehab programs 1/29 through 10/05/2020 [facility] ICU admission secondary to opiate withdrawal requiring intubation--psychiatry did see the patient during the hospital stay  Seen in ED 12/14/2020 and found to have withdrawal symptoms and was on 4/14 felt safe to discharge from the ED Patient returned to MAU 12/30/2020 complaining of nausea vomiting endorsing abdominal pain Seen by OB/GYN [name]-her symptoms are more consistent with opiate withdrawal in terms of nausea vomiting >hyperemesis /NV from pregnancy  She was admitted for management of opiate use disorder and withdrawal from heroin  BP (!) 86/48   Pulse 65   Temp 98.4 F (36.9 C) (Oral)   Resp 13   Ht 5\' 7"  (1.702 m)   Wt 50.5 kg   LMP  (LMP Unknown)   SpO2 99%   BMI 17.44 kg/m  She was slightly hypotensive this morning but does not feel dizzy Wants a diet as is hungry and think she can eat No chest pain   On exam frail Caucasian female no distress slightly anxious S1-S2 no murmur sinus on monitors abdomen soft Track marks right hand abd soft nt nd Neuro intact coherent x 4   Plan Resume methadone and [facility] [doses of methadone confirmed by PharmD] near her home for further management-she will have to call them prior to discharge to coordinate going back to them Will bolus IV fluids 250 cc check orthostatics--would hold further ongoing metoprolol IV at this time Continue saline with K for electrolyte replacement Expect that if she does well she can discharge home in the morning, will communicate with her family subsequently

## 2020-12-30 NOTE — Hospital Course (Signed)
25 year old white female community dwelling G4 P2-0-1-2 10 weeks? Bipolar, oppositional defiant disorder Opiate use disorder on methadone Xanax-has in the past declined inpatient drug rehab programs 1/29 through 10/05/2020 [facility] ICU admission secondary to opiate withdrawal requiring intubation--psychiatry did see the patient during the hospital stay  Seen in ED 12/14/2020 and found to have withdrawal symptoms and was on 4/14 felt safe to discharge from the ED Patient returned to MAU 12/30/2020 complaining of nausea vomiting endorsing abdominal pain Seen by OB/GYN Anywanu-her symptoms are more consistent with opiate withdrawal in terms of nausea vomiting been hyperemesis per them  She was admitted for management of opiate use disorder and withdrawal from heroin
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
## 2020-12-31 DIAGNOSIS — F4310 Post-traumatic stress disorder, unspecified: Secondary | ICD-10-CM | POA: Diagnosis present

## 2020-12-31 DIAGNOSIS — F913 Oppositional defiant disorder: Secondary | ICD-10-CM | POA: Diagnosis present

## 2020-12-31 DIAGNOSIS — F1123 Opioid dependence with withdrawal: Secondary | ICD-10-CM | POA: Diagnosis present

## 2020-12-31 DIAGNOSIS — F32A Depression, unspecified: Secondary | ICD-10-CM | POA: Diagnosis present

## 2020-12-31 DIAGNOSIS — O99321 Drug use complicating pregnancy, first trimester: Secondary | ICD-10-CM | POA: Diagnosis present

## 2020-12-31 DIAGNOSIS — Z3A08 8 weeks gestation of pregnancy: Secondary | ICD-10-CM | POA: Diagnosis not present

## 2020-12-31 DIAGNOSIS — O99512 Diseases of the respiratory system complicating pregnancy, second trimester: Secondary | ICD-10-CM | POA: Diagnosis present

## 2020-12-31 DIAGNOSIS — Z818 Family history of other mental and behavioral disorders: Secondary | ICD-10-CM | POA: Diagnosis not present

## 2020-12-31 DIAGNOSIS — R112 Nausea with vomiting, unspecified: Secondary | ICD-10-CM | POA: Diagnosis present

## 2020-12-31 DIAGNOSIS — Z79899 Other long term (current) drug therapy: Secondary | ICD-10-CM | POA: Diagnosis not present

## 2020-12-31 DIAGNOSIS — F410 Panic disorder [episodic paroxysmal anxiety] without agoraphobia: Secondary | ICD-10-CM | POA: Diagnosis present

## 2020-12-31 DIAGNOSIS — E876 Hypokalemia: Secondary | ICD-10-CM | POA: Diagnosis present

## 2020-12-31 DIAGNOSIS — O99342 Other mental disorders complicating pregnancy, second trimester: Secondary | ICD-10-CM | POA: Diagnosis present

## 2020-12-31 DIAGNOSIS — J45909 Unspecified asthma, uncomplicated: Secondary | ICD-10-CM | POA: Diagnosis present

## 2020-12-31 DIAGNOSIS — Z20822 Contact with and (suspected) exposure to covid-19: Secondary | ICD-10-CM | POA: Diagnosis present

## 2020-12-31 DIAGNOSIS — Z813 Family history of other psychoactive substance abuse and dependence: Secondary | ICD-10-CM | POA: Diagnosis not present

## 2020-12-31 DIAGNOSIS — Z87891 Personal history of nicotine dependence: Secondary | ICD-10-CM | POA: Diagnosis not present

## 2020-12-31 MED ORDER — POTASSIUM CHLORIDE CRYS ER 20 MEQ PO TBCR: 40.0000 meq | EXTENDED_RELEASE_TABLET | Freq: Every day | ORAL | Status: DC

## 2020-12-31 MED ORDER — COMPLETENATE 29-1 MG PO CHEW
1.0000 | CHEWABLE_TABLET | Freq: Every day | ORAL | Status: DC
  Administered 2021-01-01: 1 via ORAL
  Filled 2020-12-31 (×3): qty 1

## 2020-12-31 MED ORDER — METHADONE HCL 10 MG/ML IJ SOLN: 130.0000 mg | INTRAMUSCULAR | Status: DC | PRN

## 2020-12-31 MED ORDER — METHADONE HCL 10 MG/ML IJ SOLN
65.0000 mg | Freq: Once | INTRAMUSCULAR | Status: AC
Start: 2020-12-31 — End: 2020-12-31
  Administered 2020-12-31: 65 mg via INTRAVENOUS
  Filled 2020-12-31: qty 6.5

## 2020-12-31 MED ORDER — POTASSIUM CHLORIDE 10 MEQ/100ML IV SOLN
10.0000 meq | INTRAVENOUS | Status: AC
  Administered 2020-12-31 (×4): 10 meq via INTRAVENOUS
  Filled 2020-12-31: qty 100

## 2020-12-31 MED ORDER — ONDANSETRON 4 MG PO TBDP
4.0000 mg | ORAL_TABLET | Freq: Three times a day (TID) | ORAL | Status: DC | PRN
  Administered 2021-01-01 – 2021-01-02 (×3): 4 mg via ORAL
  Filled 2020-12-31 (×4): qty 1
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
## 2020-12-31 NOTE — Progress Notes (Signed)
Pt is not able to tolerate any diet this time. She is puking clear liquid whole night and morning. Complaining body aches. She said she can't take oral medicines. Will try later afternoon for oral medicines.  Vitals stable, no fever. Getting IV phenergan and ODT Zofran round the clock with no significant effects.  Will continue to monitor

## 2020-12-31 NOTE — Progress Notes (Signed)
PROGRESS NOTE   Gabrielle Nguyen  VZD:[id number] DOB: 1995/12/30 DOA: 12/29/2020 PCP: Duard Brady, MD (Inactive)  Brief Narrative:   25 year old white female community dwelling G4 P2-0-1-2---8 weeks and 0 days [by 12/29/20 OB USG] Bipolar, oppositional defiant disorder Opiate use disorder on methadone Xanax-has in the past declined inpatient drug rehab programs 1/29 through 10/05/2020 [facility] ICU admission secondary to opiate withdrawal requiring intubation--psychiatry did see the patient during the hospital stay  Seen in ED 12/14/2020 and found to have withdrawal symptoms and was on 4/14 felt safe to discharge from the ED Patient returned to [facility] 12/30/2020 complaining of nausea vomiting endorsing abdominal pain Seen by OB/GYN [name]-her symptoms are more consistent with opiate withdrawal in terms of nausea vomiting >hyperemesis /NV from pregnancy  She was admitted for management of opiate use disorder and withdrawal from heroin.  Psychiatry was consulted to assit with management of her underlying anxiety OB will be available as needed   Hospital-Problem based course  Withdrawal off of Methadone Methadone IV half regular dosing 65 mg given because of sick fair nausea vomiting Transition to liquid formulation a.m. 130 mg if able to take in p.o. G3 P2-0-1-2-based on 8-week ultrasound 12/29/2020 Discussed with OB/GYN this morning-they can be available to look in on patient with regards to teratogenicity of meds They will follow patient in the outpatient Nausea vomiting?  2/2 withdrawal?  2/2 emesis in pregnancy Avoid IV medications-Zofran ODT works best for her per her own admission Clear liquid diet as she did not tolerate full liquids Hypokalemia Replace with oral potassium if able to take Severe anxiety/sinus tachycardia Avoid beta-blocker-became hypotensive 4/29 when given Psychiatrist consulted to see patient- recommending sertraline 150, Xanax 1 mg twice daily, prazosin 1 mg at  bedtime PTSD Chaplain elicited to assist with psychosocial issues and discussions Clinical social worker following  DVT prophylaxis: Lovenox Code Status: Full Family Communication: None at the bedside Disposition:  Status is: Observation  The patient will require care spanning > 2 midnights and should be moved to inpatient because: Hemodynamically unstable, Persistent severe electrolyte disturbances, Altered mental status and Ongoing diagnostic testing needed not appropriate for outpatient work up  [employer]:  Patient From: Home  Planned Disposition: Home  Medically stable for discharge: No  Difficult to place patient: Resolved   Consultants:   Psychiatry  Procedures: None  Antimicrobials: None currently   Subjective: Quite anxious-relates to me history of drug dependence etc.-endorses significant guilt-offered chaplain to discuss with her Tolerating some liquids but not ready to eat-at the bedside she is spitting up in the vomit bag with ptyalism   Objective: Vitals:   12/30/20 2300 12/30/20 2330 12/31/20 0348 12/31/20 0400  BP: (!) 138/92 131/86 121/76 124/71  Pulse: 96 88 (!) 106   Resp: 12 16 17    Temp: 97.6 F (36.4 C)  99.7 F (37.6 C)   TempSrc: Oral  Oral   SpO2: 99% 98% 99%   Weight:      Height:        Intake/Output Summary (Last 24 hours) at 12/31/2020 0741 Last data filed at 12/31/2020 0348 Gross per 24 hour  Intake 3124.92 ml  Output 1550 ml  Net 1574.92 ml   Filed Weights   12/30/20 0213  Weight: 50.5 kg    Examination:  Awake cachectic coherent no distress No icterus no pallor S1-S2 no murmur Chest clear Abdomen soft no rebound  Data Reviewed: personally reviewed   k 3.2 Heme 9.9 hiv neg  CBC    Component  Value Date/Time   WBC 9.5 12/30/2020 0423   RBC 3.84 (L) 12/30/2020 0423   HGB 9.9 (L) 12/30/2020 0423   HCT 30.7 (L) 12/30/2020 0423   PLT 262 12/30/2020 0423   MCV 79.9 (L) 12/30/2020 0423   MCH 25.8 (L) 12/30/2020 0423    MCHC 32.2 12/30/2020 0423   RDW 16.4 (H) 12/30/2020 0423   LYMPHSABS 1.1 08/26/2020 1012   MONOABS 0.4 08/26/2020 1012   EOSABS 0.0 08/26/2020 1012   BASOSABS 0.0 08/26/2020 1012   CMP Latest Ref Rng & Units 12/30/2020 12/29/2020 12/14/2020  Glucose 70 - 99 mg/dL 89 446(K) 90  BUN 6 - 20 mg/dL <8(M) 8 9  Creatinine 3.81 - 1.00 mg/dL 7.71 1.65 7.90  Sodium 135 - 145 mmol/L 134(L) 136 140  Potassium 3.5 - 5.1 mmol/L 3.2(L) 3.5 3.4(L)  Chloride 98 - 111 mmol/L 96(L) 102 107  CO2 22 - 32 mmol/L 24 22 24   Calcium 8.9 - 10.3 mg/dL 9.6 9.5 9.6  Total Protein 6.5 - 8.1 g/dL - 7.8 8.1  Total Bilirubin 0.3 - 1.2 mg/dL - 0.4 0.3  Alkaline Phos 38 - 126 U/L - 72 79  AST 15 - 41 U/L - 17 17  ALT 0 - 44 U/L - 9 10     Radiology Studies: OB Comp Less 14 Wks  Result Date: 12/29/2020 CLINICAL DATA:  Abdominal pain EXAM: OBSTETRIC <14 WK ULTRASOUND TECHNIQUE: Transabdominal ultrasound was performed for evaluation of the gestation as well as the maternal uterus and adnexal regions. COMPARISON:  None. FINDINGS: Intrauterine gestational sac: Single Yolk sac:  Visualized. Embryo:  Visualized. Cardiac Activity: Visualized. Heart Rate: 195 bpm CRL: 16.5 mm   8 w 0 d                  12/31/2020 EDC: 08/10/2021 Subchorionic hemorrhage:  None visualized. Maternal uterus/adnexae: Right ovary measures 1.1 x 2.2 x 1.5 cm and is unremarkable. The left ovary is not well visualized. Patient declined endovaginal imaging. IMPRESSION: 1. Single live intrauterine pregnancy as above, estimated age 3 weeks and 0 days. Electronically Signed   By: 14/05/2021 M.D.   On: 12/29/2020 17:52     Scheduled Meds: . enoxaparin (LOVENOX) injection  40 mg Subcutaneous Q24H  . mouth rinse  15 mL Mouth Rinse BID  . pantoprazole (PROTONIX) IV  40 mg Intravenous Q24H  . prazosin  1 mg Oral QHS  . prenatal vitamin w/FE, FA  1 tablet Oral Q1200  . sertraline  150 mg Oral Daily   Continuous Infusions: . sodium chloride 150 mL/hr at  12/31/20 0555  . promethazine (PHENERGAN) injection (IM or IVPB) 25 mg (12/31/20 0921)     LOS: 0 days   Time spent: 33  31, MD [facility] To contact the attending provider between 7A-7P or the covering provider during after hours 7P-7A, please log into the web site [email or web address] and access using universal [place] password for that web site. If you do not have the password, please call the hospital operator.  12/31/2020, 7:41 AM

## 2020-12-31 NOTE — Social Work (Signed)
CSW received consult for substance use resources for patient. CSW met with patient at bedside. CSW offered patient outpatient substance use treatment services resources. Patient accepted. CSW will continue to follow.

## 2021-01-01 LAB — COMPREHENSIVE METABOLIC PANEL
ALT: 15 U/L (ref 0–44)
AST: 24 U/L (ref 15–41)
Albumin: 3.7 g/dL (ref 3.5–5.0)
Alkaline Phosphatase: 56 U/L (ref 38–126)
Anion gap: 13 (ref 5–15)
BUN: <5 mg/dL — ABNORMAL LOW (ref 6–20)
CO2: 21 mmol/L — ABNORMAL LOW (ref 22–32)
Calcium: 9 mg/dL (ref 8.9–10.3)
Chloride: 98 mmol/L (ref 98–111)
Creatinine, Ser: 0.52 mg/dL (ref 0.44–1.00)
GFR, Estimated: >60 mL/min (ref >60)
Glucose, Bld: 84 mg/dL (ref 70–99)
Potassium: 2.7 mmol/L — CL (ref 3.5–5.1)
Sodium: 132 mmol/L — ABNORMAL LOW (ref 135–145)
Total Bilirubin: 0.6 mg/dL (ref 0.3–1.2)
Total Protein: 7 g/dL (ref 6.5–8.1)

## 2021-01-01 LAB — CULTURE, OB URINE: Culture: >=100000 — AB

## 2021-01-01 MED ORDER — POTASSIUM CHLORIDE CRYS ER 20 MEQ PO TBCR
40.0000 meq | EXTENDED_RELEASE_TABLET | Freq: Two times a day (BID) | ORAL | Status: DC
  Administered 2021-01-01 – 2021-01-02 (×2): 40 meq via ORAL
  Filled 2021-01-01 (×3): qty 2

## 2021-01-01 MED ORDER — BOOST / RESOURCE BREEZE PO LIQD CUSTOM: 1.0000 | Freq: Three times a day (TID) | ORAL | Status: DC

## 2021-01-01 MED ORDER — METHADONE HCL 10 MG/ML PO CONC
130.0000 mg | Freq: Every day | ORAL | Status: DC
Start: 2021-01-01 — End: 2021-01-02
  Administered 2021-01-01 – 2021-01-02 (×2): 130 mg via ORAL
  Filled 2021-01-01 (×3): qty 13

## 2021-01-01 MED ORDER — DIGOXIN 0.25 MG/ML IJ SOLN
0.2500 mg | Freq: Once | INTRAMUSCULAR | Status: DC
  Filled 2021-01-01: qty 1

## 2021-01-01 MED ORDER — MORPHINE SULFATE (PF) 2 MG/ML IV SOLN: 2.0000 mg | Freq: Once | INTRAVENOUS | Status: DC

## 2021-01-01 MED ORDER — POTASSIUM CHLORIDE 2 MEQ/ML IV SOLN
INTRAVENOUS | Status: DC
  Filled 2021-01-01: qty 1000

## 2021-01-01 NOTE — Progress Notes (Signed)
   12/31/20 2325  Assess: MEWS Score  Temp 98.7 F (37.1 C)  BP (!) 134/99  Pulse Rate (!) 110  ECG Heart Rate (!) 112  Resp 15  Level of Consciousness Alert  SpO2 96 %  O2 Device Room Air  Assess: MEWS Score  MEWS Temp 0  MEWS Systolic 0  MEWS Pulse 2  MEWS RR 0  MEWS LOC 0  MEWS Score 2  MEWS Score Color Yellow  Assess: if the MEWS score is Yellow or Red  Were vital signs taken at a resting state? Yes  Focused Assessment No change from prior assessment  Early Detection of Sepsis Score *See Row Information* Low  MEWS guidelines implemented *See Row Information* Yes  Treat  Pain Scale 0-10  Pain Score 5  Pain Type Chronic pain  Pain Location Generalized  Pain Descriptors / Indicators Aching;Cramping  Pain Frequency Constant  Pain Onset On-going  Pain Intervention(s) Repositioned  Take Vital Signs  Increase Vital Sign Frequency  Yellow: Q 2hr X 2 then Q 4hr X 2, if remains yellow, continue Q 4hrs  Escalate  MEWS: Escalate Yellow: discuss with charge nurse/RN and consider discussing with provider and RRT  Notify: Charge Nurse/RN  Name of Charge Nurse/RN Notified Tanya, RN  Date Charge Nurse/RN Notified 12/31/20  Time Charge Nurse/RN Notified 2325  Notify: Provider  Provider Name/Title Dr. Arville Care  Date Provider Notified 01/01/21  Time Provider Notified 0020  Notification Type Page  Notification Reason Other (Comment) (increased HR)  Provider response See new orders  Date of Provider Response 01/01/21  Time of Provider Response 0030  Document  Patient Outcome Other (Comment) (on the unit)  Progress note created (see row info) Yes

## 2021-01-01 NOTE — Progress Notes (Signed)
Date and time results received: 01/01/21 1010 (use smartphrase ".now" to insert current time)  Test: Potassium Critical Value: 2.7  Name of Provider Notified: Mahala Menghini, MD  Orders Received? Or Actions Taken?: Orders Received - See Orders for details

## 2021-01-01 NOTE — Plan of Care (Signed)

## 2021-01-01 NOTE — Progress Notes (Signed)
Initial Nutrition Assessment  DOCUMENTATION CODES:   Underweight,Non-severe (moderate) malnutrition in context of chronic illness  INTERVENTION:   -Continue prenatal MVI -Boost Breeze po TID, each supplement provides 250 kcal and 9 grams of protein  NUTRITION DIAGNOSIS:   Moderate Malnutrition related to chronic illness (heroin abuse) as evidenced by energy intake < or equal to 75% for > or equal to 1 month,mild fat depletion,moderate fat depletion,mild muscle depletion,moderate muscle depletion.  GOAL:   Patient will meet greater than or equal to 90% of their needs  MONITOR:   PO intake,Supplement acceptance,Diet advancement,Labs,Skin,I & O's  REASON FOR ASSESSMENT:   Malnutrition Screening Tool    ASSESSMENT:   Gabrielle Nguyen is a 25 y.o. female with medical history significant of ongoing heroin abuse and methadone use.  Anxiety.  Recurrent admits for intractable N/V attributed to "opiate withdrawal".  Pt admitted with nausea and vomiting secondary to opiate withdrawal.   Reviewed I/O's: +1.3 L x 24 hours and +4.3 L since admission  UOP: 1.4 L x 24 hours  Emesis output: 900 ml x 24 hours  Per MD notes, pt is approximately [redacted] weeks pregnant.   Spoke with pt and father at bedside. Pt reports that she had been experiencing decreased oral intake over the past few weeks. Pt shares that she has been unable to keep foods or liquids down, even water, over the past 5 days. Pt shares that when she tries to eat "it just comes right back up". Her father is on the way to purchase her some chicken and wild rice soup from [place], stating that she thinks that she will be able to tolerate it today.   Pt explains that prior to nausea and vomiting episodes, she was experiencing poor appetite related to "deep depression". At baseline, she has a good appetite, consuming 3 meals per day (Breakfast: eggs toast, and sausage; Lunch: sandwich and soup or chik fil a; Dinner: steak and vegetables).    Pt shares that she has lost "a lot" of weight. She estimates her UBW is around 125-130#, but is unsure when she last weighed this amount. Reviewed wt hx; pt has experienced a 6.5% wt loss over the past 3 months, which is not significant for time frame, however, concerning due to prolonged poor oral intake.  Discussed rationale for clear liquid diet and encouraged pt to eat and drink as able. Pt very tearful at time of visit; RD provided comfort and emotional support. RD provided warm blanket and emesis bag per her request.   Medication reviewed and include 0.9% sodium chloride infusion @ 150 ml/hr.   Labs reviewed: Na: 134, K: 3.2.   NUTRITION - FOCUSED PHYSICAL EXAM:  Flowsheet Row Most Recent Value  Orbital Region Mild depletion  Upper Arm Region Moderate depletion  Thoracic and Lumbar Region Moderate depletion  Buccal Region Mild depletion  Temple Region Mild depletion  Clavicle Bone Region Moderate depletion  Clavicle and Acromion Bone Region Moderate depletion  Scapular Bone Region Moderate depletion  Dorsal Hand Mild depletion  Patellar Region Moderate depletion  Anterior Thigh Region Moderate depletion  Posterior Calf Region Moderate depletion  Edema (RD Assessment) None  Hair Reviewed  Eyes Reviewed  Mouth Reviewed  Skin Reviewed  Nails Reviewed       Diet Order:   Diet Order            Diet clear liquid Room service appropriate? Yes; Fluid consistency: Thin  Diet effective now  EDUCATION NEEDS:   Education needs have been addressed  Skin:  Skin Assessment: Reviewed RN Assessment  Last BM:  Unknown  Height:   Ht Readings from Last 1 Encounters:  12/30/20 5\' 7"  (1.702 m)    Weight:   Wt Readings from Last 1 Encounters:  12/30/20 50.5 kg    Ideal Body Weight:  61.4 kg  BMI:  Body mass index is 17.44 kg/m.  Estimated Nutritional Needs:   Kcal:  1750-1950  Protein:  90-105 grams  Fluid:  > 1.7 L    01/01/21, RD,  LDN, CDCES Registered Dietitian II Certified Diabetes Care and Education Specialist Please refer to [facility] for RD and/or RD on-call/weekend/after hours pager

## 2021-01-01 NOTE — Progress Notes (Signed)
PROGRESS NOTE   Gabrielle Nguyen  LKT:[id number] DOB: 17-Apr-1996 DOA: 12/29/2020 PCP: Duard Brady, MD (Inactive)  Brief Narrative:   25 year old white female community dwelling G4 P2-0-1-2---8 weeks and 0 days [by 12/29/20 OB USG] Bipolar, oppositional defiant disorder Opiate use disorder on methadone Xanax-has in the past declined inpatient drug rehab programs 1/29 through 10/05/2020 [facility] ICU admission secondary to opiate withdrawal requiring intubation--psychiatry did see the patient during the hospital stay  Seen in ED 12/14/2020 and found to have withdrawal symptoms and was on 4/14 felt safe to discharge from the ED Patient returned to MAU 12/30/2020 complaining of nausea vomiting endorsing abdominal pain Seen by OB/GYN [name]-her symptoms are more consistent with opiate withdrawal in terms of nausea vomiting >hyperemesis /NV from pregnancy  She was admitted for management of opiate use disorder and withdrawal from heroin.  Psychiatry was consulted to assit with management of her underlying anxiety OB will be available as needed   Hospital-Problem based course  Withdrawal off of Methadone Transition to liquid formulation a.m. 130 mg if able to take in p.o. If able to tolerate likely can discharge in the next day or so G3 P2-0-1-2-based on 8-week ultrasound 12/29/2020 Discussed with OB/GYN on morning of admission-available as needed They will follow patient in the outpatient Ptyalism 2/2 emesis in pregnancy Avoid IV medications-Zofran ODT works best for her per her own admission Clear liquid diet  Has ptyalism based on clear emesis and my personal visualization of the bags and I think that if we can have her keep down some food [ordered some [employer] soup this morning] we can probably plan for discharge I counseled her with regards to getting a sea band Hypokalemia Will give replacement potassium and IV saline D5 75 cc/h for now Severe anxiety/sinus tachycardia Avoid  beta-blocker-became hypotensive 4/29 when given Psychiatrist consulted to see patient- recommending sertraline 150, Xanax 1 mg twice daily, prazosin 1 mg at bedtime PTSD Chaplain elicited-social worker will help with resources At this time would DC cardiac monitoring  DVT prophylaxis: Lovenox Code Status: Full Family Communication: None at the bedside Disposition:  Status is: Observation  The patient will require care spanning > 2 midnights and should be moved to inpatient because: Hemodynamically unstable, Persistent severe electrolyte disturbances, Altered mental status and Ongoing diagnostic testing needed not appropriate for outpatient work up  Dispo:  Patient From: Home  Planned Disposition: Home  Medically stable for discharge: No  Difficult to place patient: Resolved   Consultants:   Psychiatry  Procedures: None  Antimicrobials: None currently   Subjective:  Countenance improved although patient still having clear regurgitation No chest pain   Objective: Vitals:   01/01/21 0200 01/01/21 0300 01/01/21 0432 01/01/21 0800  BP: 135/89 122/88 (!) 132/94 (!) 129/107  Pulse: (!) 108  87 (!) 122  Resp: 15 20 (!) 24 13  Temp:    98.5 F (36.9 C)  TempSrc:    Oral  SpO2: 100%  96% 100%  Weight:      Height:        Intake/Output Summary (Last 24 hours) at 01/01/2021 1027 Last data filed at 01/01/2021 0335 Gross per 24 hour  Intake 3566.58 ml  Output 1600 ml  Net 1966.58 ml   Filed Weights   12/30/20 0213  Weight: 50.5 kg    Examination:  Looks improved No icterus no pallor S1-S2 no murmur Chest clear Abdomen soft no rebound  Data Reviewed: personally reviewed   k 3.2-->2.7   Radiology Studies: No results  found.   Scheduled Meds: . digoxin  0.25 mg Intravenous Once  . enoxaparin (LOVENOX) injection  40 mg Subcutaneous Q24H  . feeding supplement  1 Container Oral TID BM  . mouth rinse  15 mL Mouth Rinse BID  . methadone  130 mg Oral Daily  .  pantoprazole (PROTONIX) IV  40 mg Intravenous Q24H  . prazosin  1 mg Oral QHS  . prenatal vitamin w/FE, FA  1 tablet Oral Q1200  . sertraline  150 mg Oral Daily   Continuous Infusions: . dextrose 5 % with kcl    . promethazine (PHENERGAN) injection (IM or IVPB) 25 mg (12/31/20 2202)     LOS: 1 day   Time spent: 41  Rhetta Mura, MD [facility] To contact the attending provider between 7A-7P or the covering provider during after hours 7P-7A, please log into the web site [email or web address] and access using universal [place] password for that web site. If you do not have the password, please call the hospital operator.  01/01/2021, 10:27 AM

## 2021-01-02 LAB — CBC WITH DIFFERENTIAL/PLATELET
Abs Immature Granulocytes: 0.03 10*3/uL (ref 0.00–0.07)
Basophils Absolute: 0 10*3/uL (ref 0.0–0.1)
Basophils Relative: 0 %
Eosinophils Absolute: 0 10*3/uL (ref 0.0–0.5)
Eosinophils Relative: 0 %
HCT: 36.9 % (ref 36.0–46.0)
Hemoglobin: 12.3 g/dL (ref 12.0–15.0)
Immature Granulocytes: 0 %
Lymphocytes Relative: 27 %
Lymphs Abs: 2.7 10*3/uL (ref 0.7–4.0)
MCH: 26.1 pg (ref 26.0–34.0)
MCHC: 33.3 g/dL (ref 30.0–36.0)
MCV: 78.3 fL — ABNORMAL LOW (ref 80.0–100.0)
Monocytes Absolute: 0.7 10*3/uL (ref 0.1–1.0)
Monocytes Relative: 8 %
Neutro Abs: 6.4 10*3/uL (ref 1.7–7.7)
Neutrophils Relative %: 65 %
Platelets: 251 10*3/uL (ref 150–400)
RBC: 4.71 MIL/uL (ref 3.87–5.11)
RDW: 15.8 % — ABNORMAL HIGH (ref 11.5–15.5)
WBC: 9.9 10*3/uL (ref 4.0–10.5)
nRBC: 0 % (ref 0.0–0.2)

## 2021-01-02 LAB — COMPREHENSIVE METABOLIC PANEL
ALT: 37 U/L (ref 0–44)
AST: 39 U/L (ref 15–41)
Albumin: 4 g/dL (ref 3.5–5.0)
Alkaline Phosphatase: 67 U/L (ref 38–126)
Anion gap: 11 (ref 5–15)
BUN: <5 mg/dL — ABNORMAL LOW (ref 6–20)
CO2: 25 mmol/L (ref 22–32)
Calcium: 9.4 mg/dL (ref 8.9–10.3)
Chloride: 98 mmol/L (ref 98–111)
Creatinine, Ser: 0.48 mg/dL (ref 0.44–1.00)
GFR, Estimated: >60 mL/min (ref >60)
Glucose, Bld: 100 mg/dL — ABNORMAL HIGH (ref 70–99)
Potassium: 3.1 mmol/L — ABNORMAL LOW (ref 3.5–5.1)
Sodium: 134 mmol/L — ABNORMAL LOW (ref 135–145)
Total Bilirubin: 0.2 mg/dL — ABNORMAL LOW (ref 0.3–1.2)
Total Protein: 7.3 g/dL (ref 6.5–8.1)

## 2021-01-02 MED ORDER — ACETAMINOPHEN 325 MG PO TABS
650.0000 mg | ORAL_TABLET | ORAL | Status: DC | PRN
  Administered 2021-01-02: 650 mg via ORAL
  Filled 2021-01-02: qty 2

## 2021-01-02 MED ORDER — METHADONE HCL 10 MG PO TABS: 130.0000 mg | ORAL_TABLET | Freq: Every day | ORAL | Status: DC

## 2021-01-02 MED ORDER — POTASSIUM CHLORIDE 20 MEQ PO PACK: 40.0000 meq | PACK | Freq: Two times a day (BID) | ORAL | 0 refills | Status: DC

## 2021-01-02 MED ORDER — FOSFOMYCIN TROMETHAMINE 3 G PO PACK: 3.0000 g | PACK | Freq: Once | ORAL | 0 refills | Status: AC

## 2021-01-02 MED ORDER — ONDANSETRON 4 MG PO TBDP: 4.0000 mg | ORAL_TABLET | Freq: Three times a day (TID) | ORAL | 0 refills | Status: DC | PRN

## 2021-01-02 NOTE — Plan of Care (Signed)
  Problem: Education: Goal: Knowledge of General Education information will improve Description: Including pain rating scale, medication(s)/side effects and non-pharmacologic comfort measures Outcome: Progressing   Problem: Clinical Measurements: Goal: Ability to maintain clinical measurements within normal limits will improve Outcome: Progressing Goal: Will remain free from infection Outcome: Progressing Goal: Respiratory complications will improve Outcome: Progressing Goal: Cardiovascular complication will be avoided Outcome: Progressing   Problem: Skin Integrity: Goal: Risk for impaired skin integrity will decrease Outcome: Progressing

## 2021-01-02 NOTE — Plan of Care (Signed)

## 2021-01-02 NOTE — Discharge Summary (Signed)
Physician Discharge Summary  [facility] XBM:[id number] DOB: 12-Dec-1995 DOA: 12/29/2020  PCP: Henreitta Cea, MD (Inactive)  Admit date: 12/29/2020 Discharge date: 01/02/2021  Time spent: 20 minutes  Recommendations for Outpatient Follow-up:  1. Needs outpatient routine OB/GYN appointment for prenatal labs etc. 2. Need repeat potassium in the outpatient setting  Discharge Diagnoses:  MAIN problem for hospitalization   Withdrawal from methadone Early trimester pregnancy ~ 8 weeks 4 days   Please see below for itemized issues addressed in [place]- refer to other progress notes for clarity if needed  Discharge Condition: Fair  Diet recommendation: Regular  Filed Weights   12/30/20 0213  Weight: 50.5 kg    History of present illness:  25 year old white female community dwelling G4 P2-0-1-2---8 weeks and 0 days [by 12/29/20 OB USG] Bipolar, oppositional defiant disorder Opiate use disorder on methadone Xanax-has in the past declined inpatient drug rehab programs Prior admit 1/29 through 10/05/2020 ICU admission secondary to opiate withdrawal requiring intubation  Seen in ED 12/14/2020 and found to have withdrawal symptoms and was on 4/14 felt safe to discharge from the ED Patient returned to MAU 12/30/2020 complaining of nausea vomiting endorsing abdominal pain Seen by OB/GYN [name]-her symptoms are more consistent with opiate withdrawal in terms of nausea vomiting>hyperemesis/NV from pregnancy  She was admitted for management of opiate use disorder and withdrawal from heroin.  Psychiatry was consulted to assit with management of her underlying anxiety OB will be available as needed  Patient was kept initially on IV methadone which was transitioned to liquid formulation-by  5/3 patient was able to tolerate a regular diet and keep down food She will need a recheck of her potassium in about 1 week or so She will follow-up at her methadone clinic in the morning for refills of  the same and no other medications were prescribed other than prenatal vitamins and potassium   her urine showed likely contaminant of Klebsiella pneumonia without any group B strep  This was found on her actual MAU visit where she was given Azo in addition to intravesical treatment?  Per patient I have called in fosfomycin for her to her pharmacy x1  She had met maximal hospital benefit by day of discharge   Consultations:  Psychiatry  Discharge Exam: Vitals:   01/02/21 1135 01/02/21 1505  BP: 137/87 (!) 125/94  Pulse: (!) 129 (!) 111  Resp: 16   Temp: 98 F (36.7 C) 98 F (36.7 C)  SpO2: 100% 96%    Subj on day of d/c   Awake coherent pleasant  General Exam on discharge  EOMI NCAT no icterus no pallor Chest clear S1-S2 no murmur Abdomen soft no rebound No lower extremity edema  Discharge Instructions   Discharge Instructions    Diet - low sodium heart healthy   Complete by: As directed    Discharge instructions   Complete by: As directed    Please follow-up with your methadone clinic Please follow-up with your OB/GYN for prenatal labs etc. etc. I recommend you getting the C bands as we discussed and I prescribed for you nausea medication with Zofran until you can be followed up  Take care   Increase activity slowly   Complete by: As directed      Allergies as of 01/02/2021   No Known Allergies     Medication List    STOP taking these medications   promethazine 12.5 MG tablet Commonly known as: PHENERGAN     TAKE these medications  albuterol 108 (90 Base) MCG/ACT inhaler Commonly known as: VENTOLIN HFA Inhale 1 puff into the lungs every 6 (six) hours as needed for wheezing or shortness of breath.   ALPRAZolam 1 MG tablet Commonly known as: XANAX Take 1 mg by mouth 2 (two) times daily as needed for anxiety.   APAP 325 MG tablet Take 2 tablets (650 mg total) by mouth every 6 (six) hours as needed for mild pain or headache.   methadone 10 MG/ML  solution Commonly known as: DOLOPHINE Take 130 mg by mouth daily. Verified with on call personnel for [facility] of [place],Christina. Last dose was 08-23-20   naloxone 2 MG/2ML injection Commonly known as: NARCAN Place 2 mg into the nose as needed (overdose).   ondansetron 4 MG disintegrating tablet Commonly known as: ZOFRAN-ODT Take 1 tablet (4 mg total) by mouth every 8 (eight) hours as needed for nausea or vomiting.   potassium chloride 20 MEQ packet Commonly known as: Klor-Con Take 40 mEq by mouth 2 (two) times daily.   prazosin 1 MG capsule Commonly known as: MINIPRESS Take 1 mg by mouth at bedtime.   sertraline 100 MG tablet Commonly known as: ZOLOFT Take 150 mg by mouth daily.      No Known Allergies    The results of significant diagnostics from this hospitalization (including imaging, microbiology, ancillary and laboratory) are listed below for reference.    Significant Diagnostic Studies: US OB Comp Less 14 Wks  Result Date: 12/29/2020 CLINICAL DATA:  Abdominal pain EXAM: OBSTETRIC <14 WK ULTRASOUND TECHNIQUE: Transabdominal ultrasound was performed for evaluation of the gestation as well as the maternal uterus and adnexal regions. COMPARISON:  None. FINDINGS: Intrauterine gestational sac: Single Yolk sac:  Visualized. Embryo:  Visualized. Cardiac Activity: Visualized. Heart Rate: 195 bpm CRL: 16.5 mm   8 w 0 d                  [place] EDC: 08/10/2021 Subchorionic hemorrhage:  None visualized. Maternal uterus/adnexae: Right ovary measures 1.1 x 2.2 x 1.5 cm and is unremarkable. The left ovary is not well visualized. Patient declined endovaginal imaging. IMPRESSION: 1. Single live intrauterine pregnancy as above, estimated age 61 weeks and 0 days. Electronically Signed   By: Randa Ngo M.D.   On: 12/29/2020 17:52    Microbiology: Recent Results (from the past 240 hour(s))  Culture, OB Urine     Status: Abnormal   Collection Time: 12/29/20 10:52 AM    Specimen: Urine, Random  Result Value Ref Range Status   Specimen Description URINE, RANDOM  Final   Special Requests NONE  Final   Culture (A)  Final    >=100,000 COLONIES/mL KLEBSIELLA PNEUMONIAE NO GROUP B STREP (S.AGALACTIAE) ISOLATED Performed at [facility], [place] [address] [place], [place] [address]    Report Status 01/01/2021 FINAL  Final   Organism ID, Bacteria KLEBSIELLA PNEUMONIAE (A)  Final      Susceptibility   Klebsiella pneumoniae - MIC*    AMPICILLIN RESISTANT Resistant     CEFAZOLIN <=4 SENSITIVE Sensitive     CEFEPIME <=0.12 SENSITIVE Sensitive     CEFTRIAXONE <=0.25 SENSITIVE Sensitive     CIPROFLOXACIN <=0.25 SENSITIVE Sensitive     GENTAMICIN <=1 SENSITIVE Sensitive     IMIPENEM <=0.25 SENSITIVE Sensitive     NITROFURANTOIN <=16 SENSITIVE Sensitive     TRIMETH/SULFA <=20 SENSITIVE Sensitive     AMPICILLIN/SULBACTAM <=2 SENSITIVE Sensitive     PIP/TAZO <=4 SENSITIVE Sensitive     * >=  100,000 COLONIES/mL KLEBSIELLA PNEUMONIAE  SARS CORONAVIRUS 2 (TAT 6-24 HRS) Nasopharyngeal Nasopharyngeal Swab     Status: None   Collection Time: 12/30/20  1:26 AM   Specimen: Nasopharyngeal Swab  Result Value Ref Range Status   SARS Coronavirus 2 NEGATIVE NEGATIVE Final    Comment: (NOTE) SARS-CoV-2 target nucleic acids are NOT DETECTED.  The SARS-CoV-2 RNA is generally detectable in upper and lower respiratory specimens during the acute phase of infection. Negative results do not preclude SARS-CoV-2 infection, do not rule out co-infections with other pathogens, and should not be used as the sole basis for treatment or other patient management decisions. Negative results must be combined with clinical observations, patient history, and epidemiological information. The expected result is Negative.  Fact Sheet for Patients: SugarRoll.be  Fact Sheet for Healthcare Providers: https://www.woods-mathews.com/  This test is not  yet approved or cleared by the [place] FDA and  has been authorized for detection and/or diagnosis of SARS-CoV-2 by FDA under an Emergency Use Authorization (EUA). This EUA will remain  in effect (meaning this test can be used) for the duration of the COVID-19 declaration under Se ction 564(b)(1) of the Act, 21 U.S.C. section 360bbb-3(b)(1), unless the authorization is terminated or revoked sooner.  Performed at [facility], [place] [address] [place], [place] [address]   MRSA PCR Screening     Status: None   Collection Time: 12/30/20  2:14 AM   Specimen: Nasopharyngeal  Result Value Ref Range Status   MRSA by PCR NEGATIVE NEGATIVE Final    Comment:        The GeneXpert MRSA Assay (FDA approved for NASAL specimens only), is one component of a comprehensive MRSA colonization surveillance program. It is not intended to diagnose MRSA infection nor to guide or monitor treatment for MRSA infections. Performed at [facility], [place] [address], [place], [place] [address]      Labs: Basic Metabolic Panel: Recent Labs  Lab 12/29/20 1111 12/30/20 0423 01/01/21 0900 01/02/21 0207  NA 136 134* 132* 134*  K 3.5 3.2* 2.7* 3.1*  CL 102 96* 98 98  CO2 22 24 21* 25  GLUCOSE 112* 89 84 100*  BUN 8 <5* <5* <5*  CREATININE 0.57 0.59 0.52 0.48  CALCIUM 9.5 9.6 9.0 9.4   Liver Function Tests: Recent Labs  Lab 12/29/20 1111 01/01/21 0900 01/02/21 0207  AST 17 24 39  ALT 9 15 37  ALKPHOS 72 56 67  BILITOT 0.4 0.6 0.2*  PROT 7.8 7.0 7.3  ALBUMIN 4.1 3.7 4.0   No results for input(s): LIPASE, AMYLASE in the last 168 hours. No results for input(s): AMMONIA in the last 168 hours. CBC: Recent Labs  Lab 12/29/20 1111 12/30/20 0423 01/02/21 0207  WBC 11.8* 9.5 9.9  NEUTROABS  --   --  6.4  HGB 10.9* 9.9* 12.3  HCT 34.7* 30.7* 36.9  MCV 82.2 79.9* 78.3*  PLT 333 262 251   Cardiac Enzymes: No results for input(s): CKTOTAL, CKMB, CKMBINDEX, TROPONINI in the  last 168 hours. BNP: BNP (last 3 results) No results for input(s): BNP in the last 8760 hours.  ProBNP (last 3 results) No results for input(s): PROBNP in the last 8760 hours.  CBG: No results for input(s): GLUCAP in the last 168 hours.     Signed:  Nita Sells MD   [facility] 01/02/2021, 5:47 PM

## 2021-01-02 NOTE — Progress Notes (Addendum)
PROGRESS NOTE   Gabrielle Nguyen  VOZ:[id number] DOB: 11-29-1995 DOA: 12/29/2020 PCP: Duard Brady, MD (Inactive)  Brief Narrative:   25 year old white female community dwelling G4 P2-0-1-2---8 weeks and 0 days [by 12/29/20 OB USG] Bipolar, oppositional defiant disorder Opiate use disorder on methadone Xanax-has in the past declined inpatient drug rehab programs Prior admit 1/29 through 10/05/2020 ICU admission secondary to opiate withdrawal requiring intubation  Seen in ED 12/14/2020 and found to have withdrawal symptoms and was on 4/14 felt safe to discharge from the ED Patient returned to MAU 12/30/2020 complaining of nausea vomiting endorsing abdominal pain Seen by OB/GYN Gabrielle Nguyen-her symptoms are more consistent with opiate withdrawal in terms of nausea vomiting >hyperemesis /NV from pregnancy  She was admitted for management of opiate use disorder and withdrawal from heroin.  Psychiatry was consulted to assit with management of her underlying anxiety OB will be available as needed   Hospital-Problem based course  Withdrawal off of Methadone Transition to liquid formulation a.m. 130 mg if able to take in p.o. G3 P2-0-1-2-based on 8-week ultrasound 12/29/2020 Discussed with faculty practice OB/GYN on morning of admission-available as needed They will follow patient in the outpatient Ptyalism 2/2  pregnancy Avoid IV medications-Zofran ODT works best for her per her own admission Graduate diet to solids and soft diet I counseled her with regards to getting a sea band for emesis Hypokalemia Will give replacement potassium 40 p.o. twice daily + IV saline D5 75 cc/h for now Severe anxiety/sinus tachycardia Avoid beta-blocker-became hypotensive 4/29 when given Psychiatrist recommending sertraline 150, Xanax 1 mg twice daily, prazosin 1 mg at bedtime PTSD Chaplain elicited-social worker will help with resources  DVT prophylaxis: Lovenox Code Status: Full Family Communication: None at  the bedside Disposition:  Status is: Observation  The patient will require care spanning > 2 midnights and should be moved to inpatient because: Hemodynamically unstable, Persistent severe electrolyte disturbances, Altered mental status and Ongoing diagnostic testing needed not appropriate for outpatient work up  Dispo:  Patient From: Home  Planned Disposition: Home  Medically stable for discharge: No  Difficult to place patient: Resolved   Consultants:   Psychiatry  Procedures: None  Antimicrobials: None currently   Subjective:  Doing fair significant ptyalism--not having diarrhea or other signs of withdrawal No chest pain No fever   Objective: Vitals:   01/01/21 2100 01/02/21 0615 01/02/21 0741 01/02/21 0805  BP: (!) 140/97 (!) 140/106 (!) 142/104 (!) 128/101  Pulse: (!) 110 (!) 112 (!) 115 (!) 119  Resp: 16 20  16   Temp: 98.2 F (36.8 C) 97.7 F (36.5 C)  97.8 F (36.6 C)  TempSrc: Axillary Axillary  Axillary  SpO2: 98% 100% 100% 99%  Weight:      Height:        Intake/Output Summary (Last 24 hours) at 01/02/2021 1112 Last data filed at 01/02/2021 03/04/2021 Gross per 24 hour  Intake 472.16 ml  Output 200 ml  Net 272.16 ml   Filed Weights   12/30/20 0213  Weight: 50.5 kg    Examination:  Looks improved No icterus no pallor S1-S2 no murmur Chest clear Abdomen soft no rebound  Data Reviewed: personally reviewed   Potassium 2.7-->3.1 WBC 9.9   Radiology Studies: No results found.   Scheduled Meds: . digoxin  0.25 mg Intravenous Once  . enoxaparin (LOVENOX) injection  40 mg Subcutaneous Q24H  . feeding supplement  1 Container Oral TID BM  . mouth rinse  15 mL Mouth Rinse BID  .  methadone  130 mg Oral Daily  . pantoprazole (PROTONIX) IV  40 mg Intravenous Q24H  . potassium chloride  40 mEq Oral BID  . prazosin  1 mg Oral QHS  . prenatal vitamin w/FE, FA  1 tablet Oral Q1200  . sertraline  150 mg Oral Daily   Continuous Infusions: .  promethazine (PHENERGAN) injection (IM or IVPB) Stopped (01/01/21 2029)     LOS: 2 days   Time spent: 15  Rhetta Mura, MD [facility] To contact the attending provider between 7A-7P or the covering provider during after hours 7P-7A, please log into the web site [email or web address] and access using universal [place] password for that web site. If you do not have the password, please call the hospital operator.  01/02/2021, 11:12 AM

## 2021-01-02 NOTE — Progress Notes (Signed)
RN went over discharge summary with pt and removed pt's PIV. Pt belongings with pt. NS transporting pt to private vehicle. Pt's neighbor to transport her home.

## 2021-01-03 ENCOUNTER — Telehealth: Payer: Self-pay | Admitting: Family Medicine

## 2021-01-03 NOTE — Telephone Encounter (Signed)
I called 2 phone#'s listed for pt but no answer no voicemail, appt was scheduled

## 2021-01-16 ENCOUNTER — Encounter: Payer: Medicaid Other | Admitting: Family Medicine

## 2021-02-02 ENCOUNTER — Inpatient Hospital Stay (HOSPITAL_COMMUNITY)
Admission: EM | Admit: 2021-02-02 | Discharge: 2021-02-08 | DRG: 831 | Disposition: A | Discharge status: Home / Self Care | Payer: Medicaid Other | Attending: Obstetrics and Gynecology | Admitting: Obstetrics and Gynecology

## 2021-02-02 DIAGNOSIS — Z87891 Personal history of nicotine dependence: Secondary | ICD-10-CM

## 2021-02-02 DIAGNOSIS — G8929 Other chronic pain: Secondary | ICD-10-CM | POA: Diagnosis present

## 2021-02-02 DIAGNOSIS — Z818 Family history of other mental and behavioral disorders: Secondary | ICD-10-CM

## 2021-02-02 DIAGNOSIS — Z79899 Other long term (current) drug therapy: Secondary | ICD-10-CM

## 2021-02-02 DIAGNOSIS — O211 Hyperemesis gravidarum with metabolic disturbance: Principal | ICD-10-CM | POA: Diagnosis present

## 2021-02-02 DIAGNOSIS — Z20822 Contact with and (suspected) exposure to covid-19: Secondary | ICD-10-CM | POA: Diagnosis present

## 2021-02-02 DIAGNOSIS — R1110 Vomiting, unspecified: Secondary | ICD-10-CM | POA: Diagnosis present

## 2021-02-02 DIAGNOSIS — Z813 Family history of other psychoactive substance abuse and dependence: Secondary | ICD-10-CM

## 2021-02-02 DIAGNOSIS — F418 Other specified anxiety disorders: Secondary | ICD-10-CM | POA: Diagnosis present

## 2021-02-02 DIAGNOSIS — O99512 Diseases of the respiratory system complicating pregnancy, second trimester: Secondary | ICD-10-CM | POA: Diagnosis present

## 2021-02-02 DIAGNOSIS — Z8249 Family history of ischemic heart disease and other diseases of the circulatory system: Secondary | ICD-10-CM

## 2021-02-02 DIAGNOSIS — O219 Vomiting of pregnancy, unspecified: Secondary | ICD-10-CM | POA: Diagnosis present

## 2021-02-02 DIAGNOSIS — R1084 Generalized abdominal pain: Secondary | ICD-10-CM

## 2021-02-02 DIAGNOSIS — O210 Mild hyperemesis gravidarum: Secondary | ICD-10-CM | POA: Diagnosis present

## 2021-02-02 DIAGNOSIS — R778 Other specified abnormalities of plasma proteins: Secondary | ICD-10-CM | POA: Clinically undetermined

## 2021-02-02 DIAGNOSIS — I5181 Takotsubo syndrome: Secondary | ICD-10-CM

## 2021-02-02 DIAGNOSIS — O99282 Endocrine, nutritional and metabolic diseases complicating pregnancy, second trimester: Secondary | ICD-10-CM | POA: Diagnosis present

## 2021-02-02 DIAGNOSIS — Z811 Family history of alcohol abuse and dependence: Secondary | ICD-10-CM

## 2021-02-02 DIAGNOSIS — F1190 Opioid use, unspecified, uncomplicated: Secondary | ICD-10-CM | POA: Diagnosis present

## 2021-02-02 DIAGNOSIS — J45909 Unspecified asthma, uncomplicated: Secondary | ICD-10-CM | POA: Diagnosis present

## 2021-02-02 DIAGNOSIS — R7989 Other specified abnormal findings of blood chemistry: Secondary | ICD-10-CM | POA: Clinically undetermined

## 2021-02-02 DIAGNOSIS — F913 Oppositional defiant disorder: Secondary | ICD-10-CM | POA: Diagnosis present

## 2021-02-02 DIAGNOSIS — Z3A13 13 weeks gestation of pregnancy: Secondary | ICD-10-CM | POA: Diagnosis not present

## 2021-02-02 DIAGNOSIS — Z3A16 16 weeks gestation of pregnancy: Secondary | ICD-10-CM

## 2021-02-02 DIAGNOSIS — E860 Dehydration: Secondary | ICD-10-CM | POA: Diagnosis present

## 2021-02-02 DIAGNOSIS — I5021 Acute systolic (congestive) heart failure: Secondary | ICD-10-CM

## 2021-02-02 DIAGNOSIS — O99342 Other mental disorders complicating pregnancy, second trimester: Secondary | ICD-10-CM | POA: Diagnosis present

## 2021-02-02 DIAGNOSIS — O99322 Drug use complicating pregnancy, second trimester: Secondary | ICD-10-CM | POA: Diagnosis present

## 2021-02-02 DIAGNOSIS — O99412 Diseases of the circulatory system complicating pregnancy, second trimester: Secondary | ICD-10-CM | POA: Diagnosis present

## 2021-02-02 DIAGNOSIS — R9431 Abnormal electrocardiogram [ECG] [EKG]: Secondary | ICD-10-CM | POA: Clinically undetermined

## 2021-02-02 DIAGNOSIS — R112 Nausea with vomiting, unspecified: Secondary | ICD-10-CM

## 2021-02-02 HISTORY — DX: Takotsubo syndrome: I51.81

## 2021-02-02 LAB — CBC WITH DIFFERENTIAL/PLATELET
Abs Immature Granulocytes: 0.03 10*3/uL (ref 0.00–0.07)
Basophils Absolute: 0 10*3/uL (ref 0.0–0.1)
Basophils Relative: 0 %
Eosinophils Absolute: 0 10*3/uL (ref 0.0–0.5)
Eosinophils Relative: 0 %
HCT: 34.3 % — ABNORMAL LOW (ref 36.0–46.0)
Hemoglobin: 10.6 g/dL — ABNORMAL LOW (ref 12.0–15.0)
Immature Granulocytes: 0 %
Lymphocytes Relative: 7 %
Lymphs Abs: 0.5 10*3/uL — ABNORMAL LOW (ref 0.7–4.0)
MCH: 24.7 pg — ABNORMAL LOW (ref 26.0–34.0)
MCHC: 30.9 g/dL (ref 30.0–36.0)
MCV: 79.8 fL — ABNORMAL LOW (ref 80.0–100.0)
Monocytes Absolute: 0.1 10*3/uL (ref 0.1–1.0)
Monocytes Relative: 1 %
Neutro Abs: 6.8 10*3/uL (ref 1.7–7.7)
Neutrophils Relative %: 92 %
Platelets: 268 10*3/uL (ref 150–400)
RBC: 4.3 MIL/uL (ref 3.87–5.11)
RDW: 16.5 % — ABNORMAL HIGH (ref 11.5–15.5)
WBC: 7.4 10*3/uL (ref 4.0–10.5)
nRBC: 0 % (ref 0.0–0.2)

## 2021-02-02 LAB — COMPREHENSIVE METABOLIC PANEL
ALT: 11 U/L (ref 0–44)
AST: 18 U/L (ref 15–41)
Albumin: 4.3 g/dL (ref 3.5–5.0)
Alkaline Phosphatase: 54 U/L (ref 38–126)
Anion gap: 17 — ABNORMAL HIGH (ref 5–15)
BUN: 7 mg/dL (ref 6–20)
CO2: 19 mmol/L — ABNORMAL LOW (ref 22–32)
Calcium: 9.8 mg/dL (ref 8.9–10.3)
Chloride: 101 mmol/L (ref 98–111)
Creatinine, Ser: 0.58 mg/dL (ref 0.44–1.00)
GFR, Estimated: >60 mL/min (ref >60)
Glucose, Bld: 94 mg/dL (ref 70–99)
Potassium: 3.2 mmol/L — ABNORMAL LOW (ref 3.5–5.1)
Sodium: 137 mmol/L (ref 135–145)
Total Bilirubin: 0.7 mg/dL (ref 0.3–1.2)
Total Protein: 8 g/dL (ref 6.5–8.1)

## 2021-02-02 LAB — URINALYSIS, ROUTINE W REFLEX MICROSCOPIC
Bilirubin Urine: NEGATIVE
Glucose, UA: NEGATIVE mg/dL
Ketones, ur: 80 mg/dL — AB
Leukocytes,Ua: NEGATIVE
Nitrite: NEGATIVE
Protein, ur: NEGATIVE mg/dL
Specific Gravity, Urine: 1.015 (ref 1.005–1.030)
pH: 5 (ref 5.0–8.0)

## 2021-02-02 LAB — RESP PANEL BY RT-PCR (FLU A&B, COVID) ARPGX2
Influenza A by PCR: NEGATIVE
Influenza B by PCR: NEGATIVE
SARS Coronavirus 2 by RT PCR: NEGATIVE

## 2021-02-02 LAB — LIPASE, BLOOD: Lipase: 32 U/L (ref 11–51)

## 2021-02-02 LAB — RAPID URINE DRUG SCREEN, HOSP PERFORMED
Amphetamines: NOT DETECTED
Barbiturates: NOT DETECTED
Benzodiazepines: POSITIVE — AB
Cocaine: NOT DETECTED
Opiates: POSITIVE — AB
Tetrahydrocannabinol: NOT DETECTED

## 2021-02-02 MED ORDER — METOCLOPRAMIDE HCL 10 MG PO TABS
10.0000 mg | ORAL_TABLET | Freq: Four times a day (QID) | ORAL | Status: DC
  Administered 2021-02-04 – 2021-02-06 (×3): 10 mg via ORAL
  Filled 2021-02-02 (×4): qty 1

## 2021-02-02 MED ORDER — DIPHENHYDRAMINE HCL 50 MG/ML IJ SOLN
25.0000 mg | Freq: Once | INTRAMUSCULAR | Status: AC
  Administered 2021-02-02: 25 mg via INTRAVENOUS
  Filled 2021-02-02: qty 1

## 2021-02-02 MED ORDER — FAMOTIDINE 20 MG PO TABS
20.0000 mg | ORAL_TABLET | Freq: Two times a day (BID) | ORAL | Status: DC
  Administered 2021-02-05 – 2021-02-08 (×5): 20 mg via ORAL
  Filled 2021-02-02 (×6): qty 1

## 2021-02-02 MED ORDER — METOCLOPRAMIDE HCL 5 MG/ML IJ SOLN
10.0000 mg | Freq: Once | INTRAMUSCULAR | Status: AC
  Administered 2021-02-02: 10 mg via INTRAVENOUS
  Filled 2021-02-02: qty 2

## 2021-02-02 MED ORDER — ONDANSETRON HCL 4 MG/2ML IJ SOLN
4.0000 mg | Freq: Once | INTRAMUSCULAR | Status: AC
  Administered 2021-02-02: 4 mg via INTRAVENOUS
  Filled 2021-02-02: qty 2

## 2021-02-02 MED ORDER — METOCLOPRAMIDE HCL 5 MG/ML IJ SOLN
10.0000 mg | Freq: Four times a day (QID) | INTRAMUSCULAR | Status: DC
  Administered 2021-02-02 – 2021-02-06 (×12): 10 mg via INTRAVENOUS
  Filled 2021-02-02 (×13): qty 2

## 2021-02-02 MED ORDER — ONDANSETRON HCL 4 MG/2ML IJ SOLN: 4.0000 mg | Freq: Three times a day (TID) | INTRAMUSCULAR | Status: DC | PRN

## 2021-02-02 MED ORDER — SODIUM CHLORIDE 0.9 % IV SOLN
8.0000 mg | Freq: Three times a day (TID) | INTRAVENOUS | Status: DC
  Administered 2021-02-04 – 2021-02-05 (×4): 8 mg via INTRAVENOUS
  Filled 2021-02-02 (×12): qty 4

## 2021-02-02 MED ORDER — SODIUM CHLORIDE 0.9 % IV BOLUS
1000.0000 mL | Freq: Once | INTRAVENOUS | Status: AC
  Administered 2021-02-02: 1000 mL via INTRAVENOUS

## 2021-02-02 MED ORDER — ACETAMINOPHEN 325 MG PO TABS
650.0000 mg | ORAL_TABLET | ORAL | Status: DC | PRN
  Administered 2021-02-04 – 2021-02-06 (×4): 650 mg via ORAL
  Filled 2021-02-02 (×5): qty 2

## 2021-02-02 MED ORDER — SERTRALINE HCL 50 MG PO TABS: 100.0000 mg | ORAL_TABLET | Freq: Every day | ORAL | Status: DC

## 2021-02-02 MED ORDER — FAMOTIDINE IN NACL 20-0.9 MG/50ML-% IV SOLN
20.0000 mg | Freq: Two times a day (BID) | INTRAVENOUS | Status: DC
  Administered 2021-02-02 – 2021-02-06 (×7): 20 mg via INTRAVENOUS
  Filled 2021-02-02 (×13): qty 50

## 2021-02-02 MED ORDER — MORPHINE SULFATE (PF) 4 MG/ML IV SOLN
4.0000 mg | Freq: Once | INTRAVENOUS | Status: AC
  Administered 2021-02-02: 4 mg via INTRAVENOUS
  Filled 2021-02-02: qty 1

## 2021-02-02 MED ORDER — ONDANSETRON HCL 4 MG/2ML IJ SOLN
4.0000 mg | Freq: Three times a day (TID) | INTRAMUSCULAR | Status: DC
  Administered 2021-02-02 – 2021-02-05 (×4): 4 mg via INTRAVENOUS
  Filled 2021-02-02 (×5): qty 2

## 2021-02-02 MED ORDER — SODIUM CHLORIDE 0.9 % IV SOLN
8.0000 mg | Freq: Three times a day (TID) | INTRAVENOUS | Status: DC | PRN
  Filled 2021-02-02 (×3): qty 4

## 2021-02-02 MED ORDER — SERTRALINE HCL 50 MG PO TABS
150.0000 mg | ORAL_TABLET | Freq: Every day | ORAL | Status: DC
  Administered 2021-02-03 – 2021-02-05 (×2): 150 mg via ORAL
  Filled 2021-02-02 (×3): qty 3

## 2021-02-02 MED ORDER — SCOPOLAMINE 1 MG/3DAYS TD PT72
1.0000 | MEDICATED_PATCH | TRANSDERMAL | Status: DC
  Administered 2021-02-02 – 2021-02-05 (×2): 1.5 mg via TRANSDERMAL
  Filled 2021-02-02 (×3): qty 1

## 2021-02-02 MED ORDER — ONDANSETRON 4 MG PO TBDP: 4.0000 mg | ORAL_TABLET | Freq: Three times a day (TID) | ORAL | Status: DC | PRN

## 2021-02-02 MED ORDER — METHADONE HCL 10 MG/ML IJ SOLN
60.0000 mg | Freq: Once | INTRAMUSCULAR | Status: AC
  Administered 2021-02-02: 60 mg via INTRAVENOUS
  Filled 2021-02-02 (×3): qty 6

## 2021-02-02 MED ORDER — ALPRAZOLAM 0.5 MG PO TABS
1.0000 mg | ORAL_TABLET | Freq: Every evening | ORAL | Status: DC | PRN
  Administered 2021-02-02: 1 mg via ORAL
  Filled 2021-02-02: qty 2

## 2021-02-02 MED ORDER — PROMETHAZINE HCL 12.5 MG RE SUPP
12.5000 mg | RECTAL | Status: DC | PRN
  Filled 2021-02-02: qty 1
  Filled 2021-02-02: qty 2

## 2021-02-02 MED ORDER — PROMETHAZINE HCL 25 MG PO TABS: 12.5000 mg | ORAL_TABLET | ORAL | Status: DC | PRN

## 2021-02-02 MED ORDER — THIAMINE HCL 100 MG/ML IJ SOLN
INTRAVENOUS | Status: AC
  Filled 2021-02-02 (×3): qty 1000

## 2021-02-02 MED ORDER — PRAZOSIN HCL 1 MG PO CAPS
1.0000 mg | ORAL_CAPSULE | Freq: Every day | ORAL | Status: DC
  Administered 2021-02-03 – 2021-02-05 (×4): 1 mg via ORAL
  Filled 2021-02-02 (×4): qty 1

## 2021-02-02 MED ORDER — ONDANSETRON 4 MG PO TBDP
4.0000 mg | ORAL_TABLET | Freq: Three times a day (TID) | ORAL | Status: DC
  Administered 2021-02-04: 4 mg via ORAL
  Administered 2021-02-05 – 2021-02-06 (×2): 8 mg via ORAL
  Filled 2021-02-02: qty 2
  Filled 2021-02-02 (×2): qty 1
  Filled 2021-02-02: qty 2

## 2021-02-02 MED ORDER — KCL IN DEXTROSE-NACL 20-5-0.45 MEQ/L-%-% IV SOLN
INTRAVENOUS | Status: DC
  Administered 2021-02-03: 125 mL/h via INTRAVENOUS
  Filled 2021-02-02 (×5): qty 1000

## 2021-02-02 NOTE — ED Notes (Signed)
Pt  Is asking for ice

## 2021-02-02 NOTE — ED Notes (Signed)
The pt does mot know wjen her last period was

## 2021-02-02 NOTE — ED Notes (Signed)
Transport coming to take the pt to womens room 105
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
## 2021-02-02 NOTE — ED Notes (Signed)
Pt given only ice chips  But she continues to vomit  gingerale given per order  The pt vomits on the floor consistently

## 2021-02-02 NOTE — ED Notes (Signed)
The doctor has seen here in the ed  The pt continues to spit and vomit on the floor  Pt saying that shes so sick

## 2021-02-02 NOTE — ED Notes (Signed)
Pt requesting methadone

## 2021-02-02 NOTE — ED Notes (Signed)
The pt keeps ringing asking about the methadone  She knows that we will being it and give it a soon as it is delivered.  She has been told many time in the past hour.  She appears more lucid after the last med given

## 2021-02-02 NOTE — ED Notes (Signed)
Still nauseated asking for the mehtadone that was requested earlier.  Has not arrived from  The main pharmacy

## 2021-02-02 NOTE — ED Triage Notes (Signed)
Pt come in via [facility] with c.c of Ingestion. Pt come from outpatient treatment. Pt presents with N/V. Pt has possible usage of Fentnyl. Pt is also pregnant. G3P2  104/59, 91HR, 100%RA

## 2021-02-02 NOTE — ED Provider Notes (Signed)
  Physical Exam  BP 117/65   Pulse 84   Temp 99.8 F (37.7 C) (Oral)   Resp 18   Ht 5\' 6"  (1.676 m)   Wt 52.2 kg   LMP  (LMP Unknown)   SpO2 98%   BMI 18.56 kg/m   Physical Exam Vitals and nursing note reviewed.  Constitutional:      Appearance: Normal appearance.     Comments: Appears unkept  Eyes:     Extraocular Movements: Extraocular movements intact.     Conjunctiva/sclera: Conjunctivae normal.     Pupils: Pupils are equal, round, and reactive to light.  Cardiovascular:     Rate and Rhythm: Regular rhythm. Tachycardia present.     Pulses: Normal pulses.     Comments: Tachycardic on my evaluation around 120 Pulmonary:     Effort: Pulmonary effort is normal.  Abdominal:     Palpations: Abdomen is soft.     Tenderness: There is abdominal tenderness.     Comments: Diffuse tenderness palpation on my exam.  No rigidity or distention.  Musculoskeletal:        General: Normal range of motion.  Skin:    General: Skin is warm.     Capillary Refill: Capillary refill takes less than 2 seconds.  Neurological:     Mental Status: She is alert and oriented to person, place, and time.     ED Course/Procedures     Procedures  MDM   Patient signed out to me by , PA-C.  Please see previous notes for further history.  In brief, patient presenting for evaluation of nausea, vomiting, abdominal pain.  Symptoms began this morning.  She is on methadone for opioid dependency, however when she showed up at the methadone clinic due to her symptoms EMS was called and she was not given her medicines.  Previous provider noted dark emesis, but not clear hematemesis.  Patient is also [redacted] weeks pregnant, has had an ultrasound confirming IUP.  No abdominal trauma or vaginal bleeding.  No vaginal discharge.  Patient is pending labs to ensure no significant abnormalities and symptom control.  On my evaluation, patient appears unkept.  She is tachycardic with diffuse tenderness palpation  the abdomen.  Emesis noted on the floor, mostly clear without signs of hematemesis.  Labs show mild drop in hemoglobin at 10, however this is most likely secondary to pregnancy.  Patient's hemoglobin has been 10 before, and without consistent hematemesis, I do not believe she needs emergent blood or admission for this.  She has mild hypokalemia likely due to vomiting and slight acidosis, once again likely due to vomiting.  Symptoms not well controlled at this time, will give further medications and reassess.  On reassessment, patient with continued vomiting.  In the setting of persistent nausea, vomiting, abdominal pain, will admit to OB/GYN for hyperemesis gravidarum  Discussed with Dr. Leontine Locket from OB/GYN, patient to be admitted         Charlotta Newton, PA-C 02/02/21 2039    2040, MD 02/02/21 2349

## 2021-02-02 NOTE — ED Notes (Signed)
Vomit bag lying on the floor with contents spilled all over the floor

## 2021-02-02 NOTE — ED Notes (Signed)
The pt just received her medicine she was waiting on  She was not given the methadone at  The clinic bevcause she was vomiting
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
## 2021-02-02 NOTE — ED Notes (Signed)
Report given to rn  [facility]

## 2021-02-03 ENCOUNTER — Encounter (HOSPITAL_COMMUNITY): Payer: Self-pay | Admitting: Obstetrics & Gynecology

## 2021-02-03 ENCOUNTER — Other Ambulatory Visit: Payer: Self-pay

## 2021-02-03 DIAGNOSIS — Z3A13 13 weeks gestation of pregnancy: Secondary | ICD-10-CM | POA: Diagnosis not present

## 2021-02-03 DIAGNOSIS — F1190 Opioid use, unspecified, uncomplicated: Secondary | ICD-10-CM | POA: Diagnosis not present

## 2021-02-03 DIAGNOSIS — O210 Mild hyperemesis gravidarum: Secondary | ICD-10-CM | POA: Diagnosis not present

## 2021-02-03 LAB — BASIC METABOLIC PANEL
Anion gap: 9 (ref 5–15)
BUN: <5 mg/dL — ABNORMAL LOW (ref 6–20)
CO2: 18 mmol/L — ABNORMAL LOW (ref 22–32)
Calcium: 9.1 mg/dL (ref 8.9–10.3)
Chloride: 107 mmol/L (ref 98–111)
Creatinine, Ser: 0.47 mg/dL (ref 0.44–1.00)
GFR, Estimated: >60 mL/min (ref >60)
Glucose, Bld: 122 mg/dL — ABNORMAL HIGH (ref 70–99)
Potassium: 3 mmol/L — ABNORMAL LOW (ref 3.5–5.1)
Sodium: 134 mmol/L — ABNORMAL LOW (ref 135–145)

## 2021-02-03 MED ORDER — METHADONE HCL 10 MG/ML IJ SOLN
65.0000 mg | Freq: Once | INTRAMUSCULAR | Status: AC
Start: 2021-02-03 — End: 2021-02-03
  Administered 2021-02-03: 65 mg via INTRAVENOUS
  Filled 2021-02-03 (×2): qty 6.5

## 2021-02-03 MED ORDER — HYDROXYZINE HCL 50 MG PO TABS
50.0000 mg | ORAL_TABLET | Freq: Once | ORAL | Status: AC
  Administered 2021-02-04: 50 mg via ORAL
  Filled 2021-02-03: qty 1

## 2021-02-03 MED ORDER — HYDROXYZINE HCL 50 MG PO TABS: 25.0000 mg | ORAL_TABLET | Freq: Three times a day (TID) | ORAL | Status: DC | PRN

## 2021-02-03 MED ORDER — HYDROXYZINE HCL 50 MG/ML IM SOLN
50.0000 mg | Freq: Four times a day (QID) | INTRAMUSCULAR | Status: DC | PRN
  Administered 2021-02-03: 50 mg via INTRAMUSCULAR
  Filled 2021-02-03 (×2): qty 1

## 2021-02-03 MED ORDER — SODIUM CHLORIDE 0.9 % IV SOLN
25.0000 mg | Freq: Three times a day (TID) | INTRAVENOUS | Status: DC | PRN
  Administered 2021-02-03 – 2021-02-04 (×2): 25 mg via INTRAVENOUS
  Filled 2021-02-03: qty 25
  Filled 2021-02-03 (×2): qty 1

## 2021-02-03 MED ORDER — ALPRAZOLAM 0.5 MG PO TABS
1.0000 mg | ORAL_TABLET | Freq: Two times a day (BID) | ORAL | Status: DC | PRN
  Administered 2021-02-03 – 2021-02-08 (×10): 1 mg via ORAL
  Filled 2021-02-03 (×10): qty 2

## 2021-02-03 NOTE — Progress Notes (Signed)
No emesis or diarrhea since admission.

## 2021-02-03 NOTE — Progress Notes (Signed)
Received patient from [facility] Main ER via stretcher.  IV fluids infusing well left AC.  Patient alert but speech slurred, oriented to place and person.  Pulse tachy at 137 and regular.  No adventitious heart sounds heard.

## 2021-02-03 NOTE — Progress Notes (Signed)
FACULTY PRACTICE ANTEPARTUM(COMPREHENSIVE) NOTE  Gabrielle Nguyen is a 25 y.o. [phone] with Estimated Date of Delivery: 08/10/21   By  early ultrasound [redacted]w[redacted]d  who is admitted for hyperemesis.    Length of Stay:  0  Days  Date of admission:02/02/2021  Subjective: Pt notes that she has not had any vomiting since she came over to [facility].  Tolerating some ice chips.  Nausea improving.  She does note diffuse muscle aches.  No other acute complaints  Vitals:  Blood pressure 126/71, pulse (!) 112, temperature 98.7 F (37.1 C), temperature source Axillary, resp. rate 18, height 5\' 6"  (1.676 m), weight 47.3 kg, SpO2 100 %, unknown if currently breastfeeding. Vitals:   02/03/21 0700 02/03/21 0705 02/03/21 0710 02/03/21 0725  BP:    126/71  Pulse:    (!) 112  Resp:    18  Temp:    98.7 F (37.1 C)  TempSrc:    Axillary  SpO2: 100% 100% 100%   Weight:      Height:       Physical Examination:  General appearance - alert, improved appearance, no acute distress Mental status - flat affect Chest - clear to auscultation, normal respiratory effort Heart - tachycardia noted, regular rhythm Abdomen - soft and non-tender, no rebound or guarding Extremities - no pedal edema noted, no calf tenderness bilaterally Skin - warm and dry  Labs:  Results for orders placed or performed during the hospital encounter of 02/02/21 (from the past 24 hour(s))  Urinalysis, Routine w reflex microscopic Urine, Clean Catch   Collection Time: 02/02/21  1:05 PM  Result Value Ref Range   Color, Urine YELLOW YELLOW   APPearance HAZY (A) CLEAR   Specific Gravity, Urine 1.015 1.005 - 1.030   pH 5.0 5.0 - 8.0   Glucose, UA NEGATIVE NEGATIVE mg/dL   Hgb urine dipstick SMALL (A) NEGATIVE   Bilirubin Urine NEGATIVE NEGATIVE   Ketones, ur 80 (A) NEGATIVE mg/dL   Protein, ur NEGATIVE NEGATIVE mg/dL   Nitrite NEGATIVE NEGATIVE   Leukocytes,Ua NEGATIVE NEGATIVE   RBC / HPF 0-5 0 - 5 RBC/hpf   WBC, UA 0-5 0 - 5 WBC/hpf    Bacteria, UA RARE (A) NONE SEEN   Squamous Epithelial / LPF 0-5 0 - 5   Mucus PRESENT   Urine rapid drug screen (hosp performed)   Collection Time: 02/02/21  1:05 PM  Result Value Ref Range   Opiates POSITIVE (A) NONE DETECTED   Cocaine NONE DETECTED NONE DETECTED   Benzodiazepines POSITIVE (A) NONE DETECTED   Amphetamines NONE DETECTED NONE DETECTED   Tetrahydrocannabinol NONE DETECTED NONE DETECTED   Barbiturates NONE DETECTED NONE DETECTED  Comprehensive metabolic panel   Collection Time: 02/02/21  2:54 PM  Result Value Ref Range   Sodium 137 135 - 145 mmol/L   Potassium 3.2 (L) 3.5 - 5.1 mmol/L   Chloride 101 98 - 111 mmol/L   CO2 19 (L) 22 - 32 mmol/L   Glucose, Bld 94 70 - 99 mg/dL   BUN 7 6 - 20 mg/dL   Creatinine, Ser 04/04/21 0.44 - 1.00 mg/dL   Calcium 9.8 8.9 - 6.94 mg/dL   Total Protein 8.0 6.5 - 8.1 g/dL   Albumin 4.3 3.5 - 5.0 g/dL   AST 18 15 - 41 U/L   ALT 11 0 - 44 U/L   Alkaline Phosphatase 54 38 - 126 U/L   Total Bilirubin 0.7 0.3 - 1.2 mg/dL   GFR, Estimated 85.4 >62  mL/min   Anion gap 17 (H) 5 - 15  CBC with Differential   Collection Time: 02/02/21  2:54 PM  Result Value Ref Range   WBC 7.4 4.0 - 10.5 K/uL   RBC 4.30 3.87 - 5.11 MIL/uL   Hemoglobin 10.6 (L) 12.0 - 15.0 g/dL   HCT 17.4 (L) 08.1 - 44.8 %   MCV 79.8 (L) 80.0 - 100.0 fL   MCH 24.7 (L) 26.0 - 34.0 pg   MCHC 30.9 30.0 - 36.0 g/dL   RDW 18.5 (H) 63.1 - 49.7 %   Platelets 268 150 - 400 K/uL   nRBC 0.0 0.0 - 0.2 %   Neutrophils Relative % 92 %   Neutro Abs 6.8 1.7 - 7.7 K/uL   Lymphocytes Relative 7 %   Lymphs Abs 0.5 (L) 0.7 - 4.0 K/uL   Monocytes Relative 1 %   Monocytes Absolute 0.1 0.1 - 1.0 K/uL   Eosinophils Relative 0 %   Eosinophils Absolute 0.0 0.0 - 0.5 K/uL   Basophils Relative 0 %   Basophils Absolute 0.0 0.0 - 0.1 K/uL   Immature Granulocytes 0 %   Abs Immature Granulocytes 0.03 0.00 - 0.07 K/uL  Lipase, blood   Collection Time: 02/02/21  2:54 PM  Result Value Ref Range    Lipase 32 11 - 51 U/L  Resp Panel by RT-PCR (Flu A&B, Covid) Nasopharyngeal Swab   Collection Time: 02/02/21  8:18 PM   Specimen: Nasopharyngeal Swab; Nasopharyngeal(NP) swabs in vial transport medium  Result Value Ref Range   SARS Coronavirus 2 by RT PCR NEGATIVE NEGATIVE   Influenza A by PCR NEGATIVE NEGATIVE   Influenza B by PCR NEGATIVE NEGATIVE    Imaging Studies:    No results found.   Medications:  Scheduled . famotidine  20 mg Oral Q12H  . methadone  65 mg Intravenous Once  . metoCLOPramide  10 mg Oral Q6H   Or  . metoCLOPramide (REGLAN) injection  10 mg Intravenous Q6H  . ondansetron  4-8 mg Oral Q8H   Or  . ondansetron (ZOFRAN) IV  4 mg Intravenous Q8H  . prazosin  1 mg Oral QHS  . scopolamine  1 patch Transdermal Q72H  . sertraline  150 mg Oral Daily   I have reviewed the patient's current medications.  ASSESSMENT: [id number] [redacted]w[redacted]d Estimated Date of Delivery: 08/10/21  Active problems: -hyperemesis -opioid use disorder -anxiety  PLAN: 1) Hyperemesis Plan to start with clears today and advance diet as tolerated D5LR with additives daily, BMP ordered for today Symptoms seemed to be improved with current regimen: Scopolamine patch, scheduled Reglan, zofran and pepcid PRN Phenergan   2) OUD -methadone oral 130mg  daily (IV 65mg ) -notes muscle aches today, pt is due for dose this am.  If withdrawal symptoms continue may consider increasing current dose  3) Anxiety -continue zoloft 150mg  daily -continue xanax 1mg  twice daily -continue prazosin 1mg  at bedtime -vistaril prn  , DO Attending Obstetrician & Gynecologist, [facility] for , [facility]

## 2021-02-03 NOTE — Progress Notes (Signed)
Patient complaining of generalized pain 10/10. Patient sleeping when rounding. Patient refuses oral pain medication. During conversation with pt at bedside, pt is drowsy and falling asleep.

## 2021-02-04 DIAGNOSIS — G8929 Other chronic pain: Secondary | ICD-10-CM | POA: Diagnosis present

## 2021-02-04 DIAGNOSIS — J45909 Unspecified asthma, uncomplicated: Secondary | ICD-10-CM | POA: Diagnosis not present

## 2021-02-04 DIAGNOSIS — Z3A13 13 weeks gestation of pregnancy: Secondary | ICD-10-CM | POA: Diagnosis not present

## 2021-02-04 DIAGNOSIS — R000 Tachycardia, unspecified: Secondary | ICD-10-CM | POA: Diagnosis not present

## 2021-02-04 DIAGNOSIS — O219 Vomiting of pregnancy, unspecified: Secondary | ICD-10-CM | POA: Diagnosis not present

## 2021-02-04 DIAGNOSIS — R9431 Abnormal electrocardiogram [ECG] [EKG]: Secondary | ICD-10-CM | POA: Diagnosis not present

## 2021-02-04 DIAGNOSIS — R112 Nausea with vomiting, unspecified: Secondary | ICD-10-CM | POA: Diagnosis not present

## 2021-02-04 DIAGNOSIS — Z79899 Other long term (current) drug therapy: Secondary | ICD-10-CM | POA: Diagnosis not present

## 2021-02-04 DIAGNOSIS — O99342 Other mental disorders complicating pregnancy, second trimester: Secondary | ICD-10-CM | POA: Diagnosis not present

## 2021-02-04 DIAGNOSIS — O211 Hyperemesis gravidarum with metabolic disturbance: Secondary | ICD-10-CM | POA: Diagnosis not present

## 2021-02-04 DIAGNOSIS — O99512 Diseases of the respiratory system complicating pregnancy, second trimester: Secondary | ICD-10-CM | POA: Diagnosis not present

## 2021-02-04 DIAGNOSIS — Z8249 Family history of ischemic heart disease and other diseases of the circulatory system: Secondary | ICD-10-CM | POA: Diagnosis not present

## 2021-02-04 DIAGNOSIS — R1084 Generalized abdominal pain: Secondary | ICD-10-CM | POA: Diagnosis present

## 2021-02-04 DIAGNOSIS — F1120 Opioid dependence, uncomplicated: Secondary | ICD-10-CM | POA: Diagnosis not present

## 2021-02-04 DIAGNOSIS — R778 Other specified abnormalities of plasma proteins: Secondary | ICD-10-CM | POA: Diagnosis not present

## 2021-02-04 DIAGNOSIS — Z818 Family history of other mental and behavioral disorders: Secondary | ICD-10-CM | POA: Diagnosis not present

## 2021-02-04 DIAGNOSIS — O99322 Drug use complicating pregnancy, second trimester: Secondary | ICD-10-CM | POA: Diagnosis not present

## 2021-02-04 DIAGNOSIS — R1110 Vomiting, unspecified: Secondary | ICD-10-CM | POA: Diagnosis not present

## 2021-02-04 DIAGNOSIS — F913 Oppositional defiant disorder: Secondary | ICD-10-CM | POA: Diagnosis present

## 2021-02-04 DIAGNOSIS — O99412 Diseases of the circulatory system complicating pregnancy, second trimester: Secondary | ICD-10-CM | POA: Diagnosis not present

## 2021-02-04 DIAGNOSIS — Z811 Family history of alcohol abuse and dependence: Secondary | ICD-10-CM | POA: Diagnosis not present

## 2021-02-04 DIAGNOSIS — E860 Dehydration: Secondary | ICD-10-CM | POA: Diagnosis not present

## 2021-02-04 DIAGNOSIS — Z813 Family history of other psychoactive substance abuse and dependence: Secondary | ICD-10-CM | POA: Diagnosis not present

## 2021-02-04 DIAGNOSIS — O210 Mild hyperemesis gravidarum: Secondary | ICD-10-CM | POA: Diagnosis not present

## 2021-02-04 DIAGNOSIS — I5021 Acute systolic (congestive) heart failure: Secondary | ICD-10-CM | POA: Diagnosis not present

## 2021-02-04 DIAGNOSIS — I5181 Takotsubo syndrome: Secondary | ICD-10-CM | POA: Diagnosis not present

## 2021-02-04 DIAGNOSIS — Z20822 Contact with and (suspected) exposure to covid-19: Secondary | ICD-10-CM | POA: Diagnosis not present

## 2021-02-04 DIAGNOSIS — F1190 Opioid use, unspecified, uncomplicated: Secondary | ICD-10-CM | POA: Diagnosis not present

## 2021-02-04 DIAGNOSIS — Z87891 Personal history of nicotine dependence: Secondary | ICD-10-CM | POA: Diagnosis not present

## 2021-02-04 DIAGNOSIS — Z3A16 16 weeks gestation of pregnancy: Secondary | ICD-10-CM | POA: Diagnosis not present

## 2021-02-04 DIAGNOSIS — O99282 Endocrine, nutritional and metabolic diseases complicating pregnancy, second trimester: Secondary | ICD-10-CM | POA: Diagnosis not present

## 2021-02-04 MED ORDER — SODIUM CHLORIDE 0.9 % IV BOLUS
1000.0000 mL | Freq: Once | INTRAVENOUS | Status: AC
  Administered 2021-02-04: 1000 mL via INTRAVENOUS

## 2021-02-04 MED ORDER — METHADONE HCL 10 MG PO TABS
130.0000 mg | ORAL_TABLET | Freq: Once | ORAL | Status: AC
  Administered 2021-02-04: 30 mg via ORAL
  Filled 2021-02-04: qty 13

## 2021-02-04 MED ORDER — METHADONE HCL 10 MG/ML PO CONC
100.0000 mg | Freq: Once | ORAL | Status: AC
  Administered 2021-02-04: 100 mg via ORAL
  Filled 2021-02-04: qty 10

## 2021-02-04 NOTE — Progress Notes (Signed)
Patient ID: Gabrielle Nguyen, female   DOB: Jun 23, 1996, 25 y.o.   MRN: [id number] ACULTY PRACTICE ANTEPARTUM COMPREHENSIVE PROGRESS NOTE  Gabrielle Nguyen is a 25 y.o. [id number] at [redacted]w[redacted]d  who is admitted for hyperemsis.   Fetal presentation is unsure. Length of Stay:  0  Days  Subjective: Pt reports not feeling well today. Not tolerating diet   Vitals:  Blood pressure 105/73, pulse (!) 161, temperature 97.6 F (36.4 C), temperature source Axillary, resp. rate 18, height 5\' 6"  (1.676 m), weight 42.8 kg, SpO2 99 %, unknown if currently breastfeeding.   Physical Examination: Lungs clear  Heart RRR Abd soft + BS Ext non tender   Labs:  No results found for this or any previous visit (from the past 24 hour(s)).     Medications:  Scheduled . famotidine  20 mg Oral Q12H  . metoCLOPramide  10 mg Oral Q6H   Or  . metoCLOPramide (REGLAN) injection  10 mg Intravenous Q6H  . ondansetron  4-8 mg Oral Q8H   Or  . ondansetron (ZOFRAN) IV  4 mg Intravenous Q8H  . prazosin  1 mg Oral QHS  . scopolamine  1 patch Transdermal Q72H  . sertraline  150 mg Oral Daily   I have reviewed the patient's current medications.  ASSESSMENT: IUP 13 2/7 Hyperemesis OUD Anxiety   PLAN: Will continue with current management.     4/7 02/04/2021,12:37 PM

## 2021-02-05 DIAGNOSIS — F1120 Opioid dependence, uncomplicated: Secondary | ICD-10-CM

## 2021-02-05 DIAGNOSIS — O219 Vomiting of pregnancy, unspecified: Secondary | ICD-10-CM | POA: Diagnosis present

## 2021-02-05 DIAGNOSIS — O210 Mild hyperemesis gravidarum: Secondary | ICD-10-CM | POA: Diagnosis not present

## 2021-02-05 LAB — BASIC METABOLIC PANEL
Anion gap: 10 (ref 5–15)
BUN: <5 mg/dL — ABNORMAL LOW (ref 6–20)
CO2: 23 mmol/L (ref 22–32)
Calcium: 8.9 mg/dL (ref 8.9–10.3)
Chloride: 100 mmol/L (ref 98–111)
Creatinine, Ser: 0.47 mg/dL (ref 0.44–1.00)
GFR, Estimated: >60 mL/min (ref >60)
Glucose, Bld: 97 mg/dL (ref 70–99)
Potassium: 2.9 mmol/L — ABNORMAL LOW (ref 3.5–5.1)
Sodium: 133 mmol/L — ABNORMAL LOW (ref 135–145)

## 2021-02-05 LAB — BLOOD GAS, VENOUS
Acid-base deficit: 0.1 mmol/L (ref 0.0–2.0)
Bicarbonate: 23.7 mmol/L (ref 20.0–28.0)
Drawn by: 2259
O2 Saturation: 84.4 %
Patient temperature: 37
pCO2, Ven: 36.5 mmHg — ABNORMAL LOW (ref 44.0–60.0)
pH, Ven: 7.428 (ref 7.250–7.430)
pO2, Ven: 48.9 mmHg — ABNORMAL HIGH (ref 32.0–45.0)

## 2021-02-05 MED ORDER — POTASSIUM CHLORIDE 10 MEQ/100ML IV SOLN
10.0000 meq | INTRAVENOUS | Status: AC
  Administered 2021-02-05 (×3): 10 meq via INTRAVENOUS
  Filled 2021-02-05 (×3): qty 100

## 2021-02-05 MED ORDER — PYRIDOXINE HCL 100 MG/ML IJ SOLN
100.0000 mg | Freq: Every day | INTRAMUSCULAR | Status: DC
  Administered 2021-02-05 – 2021-02-08 (×4): 100 mg via INTRAVENOUS
  Filled 2021-02-05 (×4): qty 1

## 2021-02-05 MED ORDER — SODIUM CHLORIDE 0.9 % IV SOLN
25.0000 mg | INTRAVENOUS | Status: DC
  Administered 2021-02-05 – 2021-02-06 (×3): 25 mg via INTRAVENOUS
  Filled 2021-02-05 (×3): qty 1

## 2021-02-05 MED ORDER — METHADONE HCL 10 MG/ML PO CONC: 130.0000 mg | Freq: Every day | ORAL | Status: DC

## 2021-02-05 MED ORDER — METHADONE HCL 10 MG/ML PO CONC
130.0000 mg | Freq: Every day | ORAL | Status: DC
  Administered 2021-02-05 – 2021-02-07 (×3): 130 mg via ORAL
  Filled 2021-02-05 (×3): qty 13

## 2021-02-05 NOTE — Progress Notes (Signed)
Patient ID: Gabrielle Nguyen, female   DOB: Mar 05, 1996, 25 y.o.   MRN: [id number] ACULTY PRACTICE ANTEPARTUM COMPREHENSIVE PROGRESS NOTE  Gabrielle Nguyen is a 25 y.o. [id number] at [redacted]w[redacted]d  who is admitted for hyperemsis.   Fetal presentation is unsure. Length of Stay:  1  Days  Subjective: Pt reports not feeling well today. She is concerned that her symptoms are related to not receiving her methadone at her usual 6 am time. She reports feeling something crawling under the skin of her legs. She is ambulating. She denies cramping or vaginal bleeding. She is tolerating sips of liquids and saltine crackers   Vitals:  Blood pressure 97/69, pulse (!) 152, temperature 98.1 F (36.7 C), temperature source Oral, resp. rate 16, height 5\' 6"  (1.676 m), weight 44.6 kg, SpO2 100 %, unknown if currently breastfeeding.   Physical Examination: Lungs clear   Heart RRR Abd soft + BS Ext non tender   Labs:  No results found for this or any previous visit (from the past 24 hour(s)).     Medications:  Scheduled . famotidine  20 mg Oral Q12H  . methadone  130 mg Oral Daily  . metoCLOPramide  10 mg Oral Q6H   Or  . metoCLOPramide (REGLAN) injection  10 mg Intravenous Q6H  . ondansetron  4-8 mg Oral Q8H   Or  . ondansetron (ZOFRAN) IV  4 mg Intravenous Q8H  . prazosin  1 mg Oral QHS  . scopolamine  1 patch Transdermal Q72H  . sertraline  150 mg Oral Daily   I have reviewed the patient's current medications.  ASSESSMENT/PLAN: IUP 13 3/7 Hyperemesis- Continue antiemetics and trial of po intake OUD- will ensure patient is scheduled to receive methadone daily at 6 am Anxiety- prn medications Will continue with current management.     Gabrielle Nguyen 02/05/2021,9:18 AM
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
## 2021-02-06 ENCOUNTER — Inpatient Hospital Stay (HOSPITAL_COMMUNITY): Payer: Medicaid Other

## 2021-02-06 DIAGNOSIS — R778 Other specified abnormalities of plasma proteins: Secondary | ICD-10-CM | POA: Clinically undetermined

## 2021-02-06 DIAGNOSIS — R000 Tachycardia, unspecified: Secondary | ICD-10-CM

## 2021-02-06 DIAGNOSIS — F418 Other specified anxiety disorders: Secondary | ICD-10-CM

## 2021-02-06 DIAGNOSIS — F1190 Opioid use, unspecified, uncomplicated: Secondary | ICD-10-CM

## 2021-02-06 DIAGNOSIS — I5181 Takotsubo syndrome: Secondary | ICD-10-CM

## 2021-02-06 DIAGNOSIS — R9431 Abnormal electrocardiogram [ECG] [EKG]: Secondary | ICD-10-CM | POA: Clinically undetermined

## 2021-02-06 DIAGNOSIS — I5021 Acute systolic (congestive) heart failure: Secondary | ICD-10-CM

## 2021-02-06 LAB — ECHOCARDIOGRAM COMPLETE
AR max vel: 2.63 cm2
AV Area VTI: 2.71 cm2
AV Area mean vel: 2.61 cm2
AV Mean grad: 3 mmHg
AV Peak grad: 6 mmHg
Ao pk vel: 1.22 m/s
Area-P 1/2: 3.97 cm2
Height: 66 in
S' Lateral: 3.5 cm
Single Plane A4C EF: 28.2 %
Weight: 1721.35 oz

## 2021-02-06 LAB — TROPONIN I (HIGH SENSITIVITY)
Troponin I (High Sensitivity): 429 ng/L (ref <18)
Troponin I (High Sensitivity): 497 ng/L (ref <18)
Troponin I (High Sensitivity): 368 ng/L (ref <18)

## 2021-02-06 LAB — BASIC METABOLIC PANEL
Anion gap: 7 (ref 5–15)
BUN: <5 mg/dL — ABNORMAL LOW (ref 6–20)
CO2: 22 mmol/L (ref 22–32)
Calcium: 8.3 mg/dL — ABNORMAL LOW (ref 8.9–10.3)
Chloride: 106 mmol/L (ref 98–111)
Creatinine, Ser: 0.36 mg/dL — ABNORMAL LOW (ref 0.44–1.00)
GFR, Estimated: >60 mL/min (ref >60)
Glucose, Bld: 89 mg/dL (ref 70–99)
Potassium: 3.1 mmol/L — ABNORMAL LOW (ref 3.5–5.1)
Sodium: 135 mmol/L (ref 135–145)

## 2021-02-06 LAB — LACTIC ACID, PLASMA
Lactic Acid, Venous: 1.3 mmol/L (ref 0.5–1.9)
Lactic Acid, Venous: 0.6 mmol/L (ref 0.5–1.9)

## 2021-02-06 LAB — BRAIN NATRIURETIC PEPTIDE: B Natriuretic Peptide: 1341.2 pg/mL — ABNORMAL HIGH (ref 0.0–100.0)

## 2021-02-06 LAB — SEDIMENTATION RATE: Sed Rate: 11 mm/hr (ref 0–22)

## 2021-02-06 LAB — MAGNESIUM: Magnesium: 1.9 mg/dL (ref 1.7–2.4)

## 2021-02-06 LAB — TSH: TSH: 1.297 u[IU]/mL (ref 0.350–4.500)

## 2021-02-06 MED ORDER — PERFLUTREN LIPID MICROSPHERE
1.0000 mL | INTRAVENOUS | Status: AC | PRN
  Administered 2021-02-06: 4 mL via INTRAVENOUS
  Filled 2021-02-06: qty 10

## 2021-02-06 MED ORDER — METOCLOPRAMIDE HCL 10 MG PO TABS
5.0000 mg | ORAL_TABLET | Freq: Four times a day (QID) | ORAL | Status: DC
  Administered 2021-02-06 – 2021-02-08 (×5): 5 mg via ORAL
  Filled 2021-02-06 (×5): qty 1

## 2021-02-06 MED ORDER — METOCLOPRAMIDE HCL 5 MG/ML IJ SOLN
5.0000 mg | Freq: Four times a day (QID) | INTRAMUSCULAR | Status: DC
  Administered 2021-02-06 – 2021-02-07 (×3): 5 mg via INTRAVENOUS
  Filled 2021-02-06 (×3): qty 2

## 2021-02-06 MED ORDER — ONDANSETRON HCL 4 MG/2ML IJ SOLN: 4.0000 mg | Freq: Three times a day (TID) | INTRAMUSCULAR | Status: DC

## 2021-02-06 MED ORDER — ONDANSETRON 4 MG PO TBDP
4.0000 mg | ORAL_TABLET | Freq: Three times a day (TID) | ORAL | Status: DC
  Administered 2021-02-06 – 2021-02-08 (×4): 4 mg via ORAL
  Filled 2021-02-06 (×4): qty 1

## 2021-02-06 MED ORDER — ASPIRIN EC 81 MG PO TBEC
81.0000 mg | DELAYED_RELEASE_TABLET | Freq: Every day | ORAL | Status: DC
  Administered 2021-02-06 – 2021-02-08 (×3): 81 mg via ORAL
  Filled 2021-02-06 (×3): qty 1

## 2021-02-06 MED ORDER — POTASSIUM CHLORIDE 20 MEQ PO PACK
40.0000 meq | PACK | Freq: Two times a day (BID) | ORAL | Status: DC
  Administered 2021-02-06 (×2): 40 meq via ORAL
  Filled 2021-02-06 (×4): qty 2

## 2021-02-06 MED ORDER — ONDANSETRON HCL 4 MG/2ML IJ SOLN
4.0000 mg | Freq: Three times a day (TID) | INTRAMUSCULAR | Status: DC
  Administered 2021-02-06 – 2021-02-07 (×2): 4 mg via INTRAVENOUS
  Filled 2021-02-06 (×3): qty 2

## 2021-02-06 MED ORDER — METOPROLOL SUCCINATE ER 25 MG PO TB24
12.5000 mg | ORAL_TABLET | Freq: Two times a day (BID) | ORAL | Status: DC
  Administered 2021-02-06 – 2021-02-07 (×2): 12.5 mg via ORAL
  Filled 2021-02-06 (×2): qty 1

## 2021-02-06 MED ORDER — METOPROLOL SUCCINATE ER 25 MG PO TB24
12.5000 mg | ORAL_TABLET | Freq: Every day | ORAL | Status: DC
  Administered 2021-02-06: 12.5 mg via ORAL
  Filled 2021-02-06: qty 1

## 2021-02-06 MED ORDER — SERTRALINE HCL 25 MG PO TABS
150.0000 mg | ORAL_TABLET | Freq: Every day | ORAL | Status: DC
  Administered 2021-02-06: 150 mg via ORAL
  Filled 2021-02-06: qty 2

## 2021-02-06 NOTE — Plan of Care (Signed)
  Problem: Education: Goal: Knowledge of General Education information will improve Description: Including pain rating scale, medication(s)/side effects and non-pharmacologic comfort measures Outcome: Progressing   Problem: Health Behavior/Discharge Planning: Goal: Ability to manage health-related needs will improve Outcome: Progressing   Problem: Nutrition: Goal: Adequate nutrition will be maintained Outcome: Progressing   Problem: Coping: Goal: Level of anxiety will decrease Outcome: Progressing   Problem: Education: Goal: Knowledge of the prescribed therapeutic regimen will improve Outcome: Progressing   Problem: Education: Goal: Knowledge of the prescribed therapeutic regimen will improve Outcome: Progressing   Problem: Bowel/Gastric: Goal: Occurences of nausea and/or vomiting will decrease Outcome: Progressing

## 2021-02-06 NOTE — Progress Notes (Signed)
All night time meds were given po.  Patient tolerated po meds very well.  No emesis or gagging noted during night.

## 2021-02-06 NOTE — Progress Notes (Signed)
Asked in safety rounds about patient's persistent tachycardia. Reviewed EKG, VS, labs and decided to order a troponin due to ? Anterolateral ischemia on EKG.  checked TSH - WNL Trop back at 429. Called cards who reviewed and wanted second troponin. Ordered already.  They will eval once this is back.

## 2021-02-06 NOTE — Progress Notes (Signed)
Patient ID: [name], female   DOB: [birth year], 25 y.o.   MRN: [id number]  EF 30% with severe hypokinesis in the apical 2/3 of LV, suggestive of takotsubo syndrome. Discussed with Cardiology - Will transfer patient to [facility]. They recommended BBlocker and ASA 81mg .   Labs still pending.  , DO

## 2021-02-06 NOTE — Progress Notes (Signed)
Patient ID: Gabrielle Nguyen, female   DOB: December 15, 1995, 25 y.o.   MRN: [id number]  Gabrielle Nguyen  Gabrielle Nguyen is a 25 y.o. [id number] at [redacted]w[redacted]d who is admitted for hyperemesis.   Fetal presentation is unsure. Length of Stay:  2  Days  Subjective: Patient extremely tachycardic overnight, with rates in the 190s. EKG done - QTc prolonged to 590s, q waves suggestive of anterolateral ischemia. HS Troponin 429, then 497 on repeat. Patient feels rapid heartrate, some mild nausea, but no chest pain or difficulty breathing. Patient reports good fetal movement.   She reports no uterine contractions She reports no bleeding  She reports no loss of fluid per vagina.  Vitals:  Blood pressure (!) 87/52, pulse (!) 126, temperature 98.2 F (36.8 C), temperature source Oral, resp. rate 16, height '5\' 6"'  (1.676 m), weight 48.8 kg, SpO2 94 %, unknown if currently breastfeeding. Physical Examination:  General appearance - alert, well appearing, and in no distress Abdomen - soft, nontender, nondistended, no masses or organomegaly Extremities: extremities normal, atraumatic, no cyanosis or edema and Homans sign is negative, no sign of DVT  Membranes:intact  Fetal Monitoring:  Not indicated  Labs:  Results for orders placed or performed during the hospital encounter of 02/02/21 (from the past 24 hour(s))  Basic metabolic panel   Collection Time: 02/05/21 10:12 AM  Result Value Ref Range   Sodium 133 (L) 135 - 145 mmol/L   Potassium 2.9 (L) 3.5 - 5.1 mmol/L   Chloride 100 98 - 111 mmol/L   CO2 23 22 - 32 mmol/L   Glucose, Bld 97 70 - 99 mg/dL   BUN <5 (L) 6 - 20 mg/dL   Creatinine, Ser 0.47 0.44 - 1.00 mg/dL   Calcium 8.9 8.9 - 10.3 mg/dL   GFR, Estimated >60 >60 mL/min   Anion gap 10 5 - 15  TSH   Collection Time: 02/05/21 10:59 PM  Result Value Ref Range   TSH 1.297 0.350 - 4.500 uIU/mL  Blood gas, venous   Collection Time: 02/05/21 10:59 PM  Result Value Ref Range   pH, Ven  7.428 7.250 - 7.430   pCO2, Ven 36.5 (L) 44.0 - 60.0 mmHg   pO2, Ven 48.9 (H) 32.0 - 45.0 mmHg   Bicarbonate 23.7 20.0 - 28.0 mmol/L   Acid-base deficit 0.1 0.0 - 2.0 mmol/L   O2 Saturation 84.4 %   Patient temperature 37.0    Collection site BLOOD LEFT HAND    Drawn by  [id number] AL    Sample type VENOUS   Lactic acid, plasma   Collection Time: 02/05/21 10:59 PM  Result Value Ref Range   Lactic Acid, Venous 1.3 0.5 - 1.9 mmol/L  Magnesium   Collection Time: 02/05/21 10:59 PM  Result Value Ref Range   Magnesium 1.9 1.7 - 2.4 mg/dL  Troponin I (High Sensitivity)   Collection Time: 02/05/21 10:59 PM  Result Value Ref Range   Troponin I (High Sensitivity) 429 (HH) <18 ng/L  Basic metabolic panel   Collection Time: 02/06/21  2:41 AM  Result Value Ref Range   Sodium 135 135 - 145 mmol/L   Potassium 3.1 (L) 3.5 - 5.1 mmol/L   Chloride 106 98 - 111 mmol/L   CO2 22 22 - 32 mmol/L   Glucose, Bld 89 70 - 99 mg/dL   BUN <5 (L) 6 - 20 mg/dL   Creatinine, Ser 0.36 (L) 0.44 - 1.00 mg/dL   Calcium 8.3 (L) 8.9 -  10.3 mg/dL   GFR, Estimated >60 >60 mL/min   Anion gap 7 5 - 15  Lactic acid, plasma   Collection Time: 02/06/21  2:41 AM  Result Value Ref Range   Lactic Acid, Venous 0.6 0.5 - 1.9 mmol/L  Troponin I (High Sensitivity)   Collection Time: 02/06/21  2:41 AM  Result Value Ref Range   Troponin I (High Sensitivity) 497 (HH) <18 ng/L    Imaging Studies:      Medications:  Scheduled . famotidine  20 mg Oral Q12H  . methadone  130 mg Oral Daily  . metoCLOPramide  10 mg Oral Q6H   Or  . metoCLOPramide (REGLAN) injection  10 mg Intravenous Q6H  . ondansetron  4-8 mg Oral Q8H   Or  . ondansetron (ZOFRAN) IV  4 mg Intravenous Q8H  . potassium chloride  40 mEq Oral BID  . prazosin  1 mg Oral QHS  . pyridOXINE  100 mg Intravenous Daily  . scopolamine  1 patch Transdermal Q72H  . sertraline  150 mg Oral Daily   I have reviewed the patient's current medications. Prior to  Admission:  Medications Prior to Admission  Medication Sig Dispense Refill Last Dose  . ALPRAZolam (XANAX) 1 MG tablet Take 1 mg by mouth 2 (two) times daily as needed for anxiety.   02/02/2021 at Unknown time  . clonazePAM (KLONOPIN) 1 MG tablet Take 1 mg by mouth 2 (two) times daily as needed for anxiety.   02/01/2021 at Unknown time  . methadone (DOLOPHINE) 10 MG/ML solution Take 130 mg by mouth daily. Verified with on call personnel for [facility] of [facility]. Last dose was 08-23-20   02/01/2021 at Unknown time  . naloxone [facility]) 2 MG/2ML injection Place 2 mg into the nose as needed (overdose).   unknown  . ondansetron (ZOFRAN-ODT) 4 MG disintegrating tablet Take 1 tablet (4 mg total) by mouth every 8 (eight) hours as needed for nausea or vomiting. 20 tablet 0 02/02/2021 at Unknown time  . prazosin (MINIPRESS) 1 MG capsule Take 1 mg by mouth at bedtime.   02/01/2021 at Unknown time  . sertraline (ZOLOFT) 100 MG tablet Take 150 mg by mouth at bedtime.   02/01/2021 at Unknown time  . acetaminophen 325 MG tablet Take 2 tablets (650 mg total) by mouth every 6 (six) hours as needed for mild pain or headache. (Patient not taking: Reported on 02/02/2021) 30 tablet 0 Completed Course at Unknown time  . albuterol (VENTOLIN HFA) 108 (90 Base) MCG/ACT inhaler Inhale 1 puff into the lungs every 6 (six) hours as needed for wheezing or shortness of breath.   unknown  . potassium chloride (KLOR-CON) 20 MEQ packet Take 40 mEq by mouth 2 (two) times daily. (Patient not taking: No sig reported) 6 packet 0 Completed Course at Unknown time   Scheduled: . famotidine  20 mg Oral Q12H  . methadone  130 mg Oral Daily  . metoCLOPramide  10 mg Oral Q6H   Or  . metoCLOPramide (REGLAN) injection  10 mg Intravenous Q6H  . ondansetron  4-8 mg Oral Q8H   Or  . ondansetron (ZOFRAN) IV  4 mg Intravenous Q8H  . potassium chloride  40 mEq Oral BID  . prazosin  1 mg Oral QHS  . pyridOXINE  100 mg Intravenous  Daily  . scopolamine  1 patch Transdermal Q72H  . sertraline  150 mg Oral Daily   Continuous: . famotidine (PEPCID) IV 20 mg (02/05/21  1062)  . ondansetron (ZOFRAN) IV 8 mg (02/05/21 1435)  . promethazine (PHENERGAN) IV infusion (for hyperemesis) 25 mg (02/06/21 0527)    ASSESSMENT: Active Problems:   Anxiety with depression   Opioid use disorder   Hyperemesis affecting pregnancy, antepartum   Hyperemesis   Nausea and vomiting in pregnancy   Prolonged Q-T interval on ECG   Elevated troponin   PLAN: 1. Elevated Troponin with prolonged QTc  Discussed with Dr Haroldine Laws with cardiology  Stat echo (he called the echo lab to arrange)  Will get stat ESR, BNP, rpt troponin,   Will need to look at medications to try to decrease QT prolonging medications 2. Hyperemesis  On reglan, zofran, phenergan drip, scopolamine patch. 3. Anxiety 1. On xanax 4. Opioid use disorder 1. On methadone 157m daily  Continue routine antenatal care.   STruett Mainland DO 02/06/2021,8:53 AM

## 2021-02-06 NOTE — Progress Notes (Signed)
Date and time results received: 02/06/21 0023 (use smartphrase ".now" to insert current time)  Test:Troponin  Critical Value:429  Name of Provider Notified: Dr Shawnie Pons  Orders Received? Or Actions Taken?: no new orders
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
## 2021-02-06 NOTE — Progress Notes (Signed)
Progress Note  Patient Name: Gabrielle Nguyen Date of Encounter: 02/06/2021  [facility] Cardiologist: Maikayla Beggs   Subjective   25 yo , currently [redacted] weeks pregnant, admmitted with hyperemesis - had similar with her 2 previous pregnancies  + troponins   Echo shows severe LV dysfunction ( EF 25-30%) , mild MR , normal RV  Severe apical hypokinesis / akinesis.  ? Takotsubo      Inpatient Medications    Scheduled Meds: . famotidine  20 mg Oral Q12H  . methadone  130 mg Oral Daily  . metoCLOPramide  10 mg Oral Q6H   Or  . metoCLOPramide (REGLAN) injection  10 mg Intravenous Q6H  . ondansetron  4-8 mg Oral Q8H   Or  . ondansetron (ZOFRAN) IV  4 mg Intravenous Q8H  . potassium chloride  40 mEq Oral BID  . prazosin  1 mg Oral QHS  . pyridOXINE  100 mg Intravenous Daily  . scopolamine  1 patch Transdermal Q72H  . sertraline  150 mg Oral Daily   Continuous Infusions: . famotidine (PEPCID) IV 20 mg (02/05/21 0925)  . ondansetron (ZOFRAN) IV 8 mg (02/05/21 1435)  . promethazine (PHENERGAN) IV infusion (for hyperemesis) 25 mg (02/06/21 0527)   PRN Meds: acetaminophen, ALPRAZolam, hydrOXYzine, perflutren lipid microspheres (DEFINITY) IV suspension   Vital Signs    Vitals:   02/05/21 2314 02/06/21 0252 02/06/21 0538 02/06/21 0755  BP: 107/74 (!) 94/57  (!) 87/52  Pulse: (!) 130 (!) 144  (!) 126  Resp: 15 15  16   Temp: 98.2 F (36.8 C) 98 F (36.7 C)  98.2 F (36.8 C)  TempSrc: Oral Oral  Oral  SpO2: 100% 100%  94%  Weight:   48.8 kg   Height:        Intake/Output Summary (Last 24 hours) at 02/06/2021 0942 Last data filed at 02/06/2021 0538 Gross per 24 hour  Intake 3610.51 ml  Output 2100 ml  Net 1510.51 ml   Last 3 Weights 02/06/2021 02/05/2021 02/04/2021  Weight (lbs) 107 lb 9.4 oz 98 lb 5 oz 94 lb 7 oz  Weight (kg) 48.8 kg 44.594 kg 42.837 kg  Some encounter information is confidential and restricted. Go to Review Flowsheets activity to see all data.       Telemetry    Sinus tach  - Personally Reviewed  ECG     sinus tach  - Personally Reviewed  Physical Exam   GEN: Young, thin, female, no acute distress. Neck: No JVD Cardiac: RRR, no murmurs, rubs, or gallops.  She is tachycardic. Respiratory: Clear to auscultation bilaterally. GI: Soft, nontender, non-distended  MS: No edema; No deformity. Neuro:  Nonfocal  Psych: Normal affect   Labs    High Sensitivity Troponin:   Recent Labs  Lab 02/05/21 2259 02/06/21 0241  TROPONINIHS 429* 497*      Chemistry Recent Labs  Lab 02/02/21 1454 02/03/21 0747 02/05/21 1012 02/06/21 0241  NA 137 134* 133* 135  K 3.2* 3.0* 2.9* 3.1*  CL 101 107 100 106  CO2 19* 18* 23 22  GLUCOSE 94 122* 97 89  BUN 7 <5* <5* <5*  CREATININE 0.58 0.47 0.47 0.36*  CALCIUM 9.8 9.1 8.9 8.3*  PROT 8.0  --   --   --   ALBUMIN 4.3  --   --   --   AST 18  --   --   --   ALT 11  --   --   --  ALKPHOS 54  --   --   --   BILITOT 0.7  --   --   --   GFRNONAA >60 >60 >60 >60  ANIONGAP 17* 9 10 7      Hematology Recent Labs  Lab 02/02/21 1454  WBC 7.4  RBC 4.30  HGB 10.6*  HCT 34.3*  MCV 79.8*  MCH 24.7*  MCHC 30.9  RDW 16.5*  PLT 268    BNPNo results for input(s): BNP, PROBNP in the last 168 hours.   DDimer No results for input(s): DDIMER in the last 168 hours.   Radiology    No results found.  Cardiac Studies     Patient Profile     25 y.o. female with hyperemesis, volume depletion and subsequent sinus tachycardia.  Assessment & Plan    1. Acute systolic CHF:  Echo shows EF 30%.   Looks like Takotsubo syndrome  Her BP was slightly elevated yesterday but is lower today after receiving prazosin 1 mg last night  I think she would benefit from getting some IV NS since her PO intake has been so poor.  Would like to start low dose metoprolol XL 12.5 mg po today . Will need ASA 81 mg a day  Will need to monitor her BP closely .  She will need to eat and drink better.  DC  prazosin   I would like to consult the Advanced CHF team for further assistance with this complicated OB patient with acutely reduced LV EF .   2.  Sinus tachycardia: likely associated with her LV dysfunction   3.  Pregnancy:   I have discussed with Dr. 30.   Will transfer her to [facility] for further management .    For questions or updates, please contact [facility] Please consult [email or web address] for contact info under        Signed, [facility], MD  02/06/2021, 9:42 AM

## 2021-02-06 NOTE — Progress Notes (Signed)
   02/06/21 1900  Assess: MEWS Score  Temp 98.4 F (36.9 C)  BP 106/77  Pulse Rate (!) 117  ECG Heart Rate (!) 117  Resp 14  SpO2 100 %  O2 Device Room Air  Assess: MEWS Score  MEWS Temp 0  MEWS Systolic 0  MEWS Pulse 2  MEWS RR 0  MEWS LOC 0  MEWS Score 2  MEWS Score Color Yellow  Assess: if the MEWS score is Yellow or Red  Were vital signs taken at a resting state? Yes  Focused Assessment No change from prior assessment  Early Detection of Sepsis Score *See Row Information* Low  MEWS guidelines implemented *See Row Information* No, previously yellow, continue vital signs every 4 hours  Treat  Pain Scale 0-10  Pain Score 0

## 2021-02-06 NOTE — Progress Notes (Signed)
Date and time results received: 02/06/21 0400 (use smartphrase ".now" to insert current time)  Test: troponin  Critical Value:Results for ERIKKA, FOLLMER (MRN [id number]) as of 02/06/2021 03:56  Ref. Range 02/06/2021 02:41  Troponin I (High Sensitivity) Latest Ref Range: <18 ng/L [facility])    Name of Provider Notified: Dr Shawnie Pons  Orders Received? Or Actions Taken?:  No new orders.

## 2021-02-07 ENCOUNTER — Other Ambulatory Visit: Payer: Self-pay | Admitting: Obstetrics and Gynecology

## 2021-02-07 DIAGNOSIS — R000 Tachycardia, unspecified: Secondary | ICD-10-CM

## 2021-02-07 DIAGNOSIS — R1110 Vomiting, unspecified: Secondary | ICD-10-CM

## 2021-02-07 DIAGNOSIS — I5181 Takotsubo syndrome: Secondary | ICD-10-CM

## 2021-02-07 LAB — BASIC METABOLIC PANEL
Anion gap: 11 (ref 5–15)
BUN: <5 mg/dL — ABNORMAL LOW (ref 6–20)
CO2: 24 mmol/L (ref 22–32)
Calcium: 8.5 mg/dL — ABNORMAL LOW (ref 8.9–10.3)
Chloride: 100 mmol/L (ref 98–111)
Creatinine, Ser: 0.33 mg/dL — ABNORMAL LOW (ref 0.44–1.00)
GFR, Estimated: >60 mL/min (ref >60)
Glucose, Bld: 88 mg/dL (ref 70–99)
Potassium: 3.1 mmol/L — ABNORMAL LOW (ref 3.5–5.1)
Sodium: 135 mmol/L (ref 135–145)

## 2021-02-07 MED ORDER — METOPROLOL SUCCINATE ER 25 MG PO TB24
25.0000 mg | ORAL_TABLET | Freq: Two times a day (BID) | ORAL | Status: DC
  Administered 2021-02-07 – 2021-02-08 (×2): 25 mg via ORAL
  Filled 2021-02-07 (×2): qty 1

## 2021-02-07 MED ORDER — METHADONE HCL 10 MG/ML PO CONC: 130.0000 mg | Freq: Every day | ORAL | Status: DC

## 2021-02-07 MED ORDER — METHADONE HCL 10 MG/ML IJ SOLN
65.0000 mg | Freq: Once | INTRAMUSCULAR | Status: AC
Start: 2021-02-07 — End: 2021-02-07
  Administered 2021-02-07: 65 mg via INTRAVENOUS
  Filled 2021-02-07: qty 6.5

## 2021-02-07 MED ORDER — PRENATAL MULTIVITAMIN CH
1.0000 | ORAL_TABLET | Freq: Every day | ORAL | Status: DC
  Administered 2021-02-07 – 2021-02-08 (×2): 1 via ORAL
  Filled 2021-02-07 (×2): qty 1

## 2021-02-07 MED ORDER — SERTRALINE HCL 25 MG PO TABS
75.0000 mg | ORAL_TABLET | Freq: Every day | ORAL | Status: DC
  Administered 2021-02-07: 75 mg via ORAL
  Filled 2021-02-07: qty 3

## 2021-02-07 MED ORDER — POTASSIUM CHLORIDE CRYS ER 10 MEQ PO TBCR
40.0000 meq | EXTENDED_RELEASE_TABLET | Freq: Two times a day (BID) | ORAL | Status: DC
  Administered 2021-02-07 – 2021-02-08 (×3): 40 meq via ORAL
  Filled 2021-02-07 (×3): qty 4

## 2021-02-07 MED ORDER — BOOST / RESOURCE BREEZE PO LIQD CUSTOM: 1.0000 | Freq: Three times a day (TID) | ORAL | Status: DC

## 2021-02-07 MED ORDER — METOPROLOL SUCCINATE ER 25 MG PO TB24
12.5000 mg | ORAL_TABLET | Freq: Once | ORAL | Status: AC
  Administered 2021-02-07: 12.5 mg via ORAL
  Filled 2021-02-07: qty 1

## 2021-02-07 MED ORDER — POTASSIUM CHLORIDE CRYS ER 10 MEQ PO TBCR
40.0000 meq | EXTENDED_RELEASE_TABLET | Freq: Two times a day (BID) | ORAL | Status: DC
  Filled 2021-02-07: qty 2

## 2021-02-07 NOTE — TOC Initial Note (Signed)
Transition of Care (TOC) - Initial/Assessment Note  Heart Failure  Patient Details  Name: Gabrielle Nguyen MRN: [id number] Date of Birth: [birth year]  Transition of Care [facility]) CM/SW Contact:    Kyona Chauncey, LCSWA Phone Number: 02/07/2021, 10:26 AM  Clinical Narrative:                 CSW spoke with the patient at bedside and completed a very brief SDOH screening with the patient who reported having some needs such as help with bills. [name] reported that she does get Food Stamps but needs to provide DSS with a birth certificate for her daughter so she can continue receiving Food Stamps. [name] denied needing help with re-instating her Food Stamps. [name] reported receiving methadone at [facility] and will continue to go there upon discharge from the hospital. 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 [name] reported she may need help with some of her bills and that she was late with her rent but was able to get support from family. CSW provided the patient with the social workers name, number and position and to reach out to CSW as other social needs arise.       CSW will continue to follow throughout discharge.   Barriers to Discharge: Continued Medical Work up   Patient Goals and CMS Choice        Expected Discharge Plan and Services   In-house Referral: Clinical Social Work     Living arrangements for the past 2 months: Single Family Home                                      Prior Living Arrangements/Services Living arrangements for the past 2 months: Single Family Home Lives with:: Self,Minor Children Patient language and need for interpreter reviewed:: Yes        Need for Family Participation in Patient Care: No (Comment) Care giver support system in place?: No (comment)   Criminal Activity/Legal Involvement Pertinent to Current Situation/Hospitalization: No - Comment as  needed  Activities of Daily Living Home Assistive Devices/Equipment: None ADL Screening (condition at time of admission) Patient's cognitive ability adequate to safely complete daily activities?: Yes Is the patient deaf or have difficulty hearing?: No Does the patient have difficulty seeing, even when wearing glasses/contacts?: No Does the patient have difficulty concentrating, remembering, or making decisions?: No Patient able to express need for assistance with ADLs?: Yes Does the patient have difficulty dressing or bathing?: No Independently performs ADLs?: Yes (appropriate for developmental age) Communication: Independent Dressing (OT): Independent Grooming: Independent Feeding: Independent Bathing: Independent Toileting: Independent In/Out Bed: Independent Walks in Home: Independent Does the patient have difficulty walking or climbing stairs?: No Weakness of Legs: None Weakness of Arms/Hands: None  Permission Sought/Granted                  Emotional Assessment Appearance:: Appears stated age Attitude/Demeanor/Rapport: Engaged Affect (typically observed): Pleasant Orientation: : Oriented to Self,Oriented to Place,Oriented to  Time,Oriented to Situation   Psych Involvement: No (comment)  Admission diagnosis:  Generalized abdominal pain [R10.84] Hyperemesis affecting pregnancy, antepartum [O21.0] Nausea and vomiting, intractability of vomiting not specified, unspecified vomiting type [R11.2] Hyperemesis [R11.10] Nausea and vomiting in pregnancy [O21.9] Patient Active Problem List   Diagnosis Date Noted  . Prolonged Q-T interval on ECG  02/06/2021  . Elevated troponin 02/06/2021  . Acute systolic CHF (congestive heart failure) (HCC)   . Takotsubo cardiomyopathy   . Nausea and vomiting in pregnancy 02/05/2021  . Hyperemesis 02/04/2021  . Hyperemesis affecting pregnancy, antepartum 02/02/2021  . Opioid use disorder 12/30/2020  . Nausea and vomiting 09/30/2020  .  Asthma   . Anxiety with depression   . Normocytic anemia   . 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 Opioid withdrawal (HCC) 07/05/2020  . Intractable nausea and vomiting 07/04/2020  . Positive urine drug screen 11/26/2019  . No prenatal care in current pregnancy 11/26/2019  . MDD (major depressive disorder) 08/19/2011  . Impulse control disorder 08/19/2011  . Panic disorder 08/19/2011   PCP:  Duard Brady, MD (Inactive) Pharmacy:   [phone] - [place], [place] - [address] AT [facility] [address] [place] [address] Phone: [phone] Fax: [phone]  [id number] [place], [place] - [address] AT [facility] [address] [place] [address] Phone: [phone] Fax: [phone]  [facility] [address] [place] [address] Phone: [phone] Fax: [phone]  [id number] [place], [place] - [address] [place] [address] Phone: [phone] Fax: [phone]     Social Determinants of Health (SDOH) Interventions Food Insecurity Interventions: Other (Comment) (Pt. reports receiving food stamps she just needs to follow up with DSS to get them restarted and provide them with paperwork. pt. declined needing assistance with this) Financial Strain Interventions: Other (Comment) (Referral to outpatient HV clinic for F/U) Housing Interventions: Other (Comment) (Referral to [facility] outpatient clinic for F/U) Transportation Interventions: Intervention Not Indicated  Readmission Risk Interventions No flowsheet data found.  Gabrielle Nguyen, MSW, LCSWA [phone] Heart Failure Social Worker

## 2021-02-07 NOTE — Progress Notes (Signed)
Progress Note  Patient Name: Gabrielle Nguyen Date of Encounter: 02/07/2021  [facility] Cardiologist: Fatih Stalvey   Subjective   25 yo with hyperemesis, sinus tach and takotsubo syndrome. We started Toprol xl last night  Seems to be feeling better  Nausea and vomitting continue   I appreciate the consult / assistance  by the Advanced CHF team .    Inpatient Medications    Scheduled Meds: . aspirin EC  81 mg Oral Daily  . famotidine  20 mg Oral Q12H  . methadone  130 mg Oral Daily  . metoCLOPramide  5 mg Oral Q6H   Or  . metoCLOPramide (REGLAN) injection  5 mg Intravenous Q6H  . metoprolol succinate  12.5 mg Oral BID  . ondansetron  4 mg Oral Q8H   Or  . ondansetron (ZOFRAN) IV  4 mg Intravenous Q8H   Or  . ondansetron (ZOFRAN) IV  4 mg Intravenous Q8H  . potassium chloride  40 mEq Oral BID  . pyridOXINE  100 mg Intravenous Daily  . scopolamine  1 patch Transdermal Q72H  . sertraline  150 mg Oral Daily   Continuous Infusions: . famotidine (PEPCID) IV 20 mg (02/06/21 1150)   PRN Meds: acetaminophen, ALPRAZolam, hydrOXYzine   Vital Signs    Vitals:   02/06/21 2300 02/07/21 0249 02/07/21 0300 02/07/21 0754  BP: 99/76  102/71 102/63  Pulse: (!) 108  (!) 105 (!) 102  Resp: 15  17 16   Temp: 98.1 F (36.7 C)  98.5 F (36.9 C) 97.9 F (36.6 C)  TempSrc: Oral  Oral Oral  SpO2: 98%  98% 98%  Weight:  49.3 kg    Height:        Intake/Output Summary (Last 24 hours) at 02/07/2021 0905 Last data filed at 02/06/2021 2000 Gross per 24 hour  Intake 1930.13 ml  Output 900 ml  Net 1030.13 ml   Last 3 Weights 02/07/2021 02/06/2021 02/05/2021  Weight (lbs) 108 lb 11 oz 107 lb 9.4 oz 98 lb 5 oz  Weight (kg) 49.3 kg 48.8 kg 44.594 kg  Some encounter information is confidential and restricted. Go to Review Flowsheets activity to see all data.      Telemetry    Sinus tach at 103  - Personally Reviewed  ECG     - Personally Reviewed  Physical Exam   GEN: this female, NAD   Neck: No JVD Cardiac: RRR, soft systolic murmur,  No S3  Respiratory: Clear to auscultation bilaterally. GI: Soft, nontender, non-distended  MS: No edema; No deformity. Neuro:  Nonfocal  Psych: Normal affect   Labs    High Sensitivity Troponin:   Recent Labs  Lab 02/05/21 2259 02/06/21 0241 02/06/21 0927  TROPONINIHS 429* 497* 368*      Chemistry Recent Labs  Lab 02/02/21 1454 02/03/21 0747 02/05/21 1012 02/06/21 0241  NA 137 134* 133* 135  K 3.2* 3.0* 2.9* 3.1*  CL 101 107 100 106  CO2 19* 18* 23 22  GLUCOSE 94 122* 97 89  BUN 7 <5* <5* <5*  CREATININE 0.58 0.47 0.47 0.36*  CALCIUM 9.8 9.1 8.9 8.3*  PROT 8.0  --   --   --   ALBUMIN 4.3  --   --   --   AST 18  --   --   --   ALT 11  --   --   --   ALKPHOS 54  --   --   --   BILITOT 0.7  --   --   --  GFRNONAA >60 >60 >60 >60  ANIONGAP 17* 9 10 7      Hematology Recent Labs  Lab 02/02/21 1454  WBC 7.4  RBC 4.30  HGB 10.6*  HCT 34.3*  MCV 79.8*  MCH 24.7*  MCHC 30.9  RDW 16.5*  PLT 268    BNP Recent Labs  Lab 02/06/21 0927  BNP 1,341.2*     DDimer No results for input(s): DDIMER in the last 168 hours.   Radiology    ECHOCARDIOGRAM COMPLETE  Result Date: 02/06/2021    ECHOCARDIOGRAM REPORT   Patient Name:   Gabrielle Nguyen Date of Exam: 02/06/2021 Medical Rec #:  04/08/2021     Height:       66.0 in Accession #:    [id number]    Weight:       107.6 lb Date of Birth:  October 06, 1995    BSA:          1.537 m Patient Age:    24 years      BP:           87/52 mmHg Patient Gender: F             HR:           118 bpm. Exam Location:  Inpatient Procedure: 2D Echo, Cardiac Doppler and Color Doppler Indications:    Abnormal ECG  History:        Patient has no prior history of Echocardiogram examinations.  Sonographer:    08/24/1994 Referring Phys: [phone] JACOB J STINSON IMPRESSIONS  1. There is no left ventricular thrombus seen with Definity contrast. There is severe hypokinesis of the apical 2/3 of the left  ventrcile with preserved basal contractility. 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 Left ventricular ejection fraction, by estimation, is 30 to 35%. The left ventricle has moderately decreased function. The left ventricle demonstrates regional wall motion abnormalities (see scoring diagram/findings for description). Indeterminate diastolic filling due to E-A fusion.  2. Right ventricular systolic function is normal. The right ventricular size is normal. Tricuspid regurgitation signal is inadequate for assessing PA pressure.  3. Left atrial size was mildly dilated.  4. The pericardial effusion is posterior to the left ventricle.  5. The mitral valve is normal in structure. Trivial mitral valve regurgitation.  6. The aortic valve is tricuspid. Aortic valve regurgitation is not visualized. No aortic stenosis is present.  7. The inferior vena cava is normal in size with greater than 50% respiratory variability, suggesting right atrial pressure of 3 mmHg. FINDINGS  Left Ventricle: There is no left ventricular thrombus seen with Definity contrast. There is severe hypokinesis of the apical 2/3 of the left ventrcile with preserved basal contractility. 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  Left ventricular ejection fraction, by estimation, is 30 to 35%. The left ventricle has moderately decreased function. The left ventricle demonstrates regional wall motion abnormalities. The left ventricular internal cavity size was normal in size. There is no left ventricular hypertrophy. Indeterminate diastolic filling due to E-A fusion. Right Ventricle: The right ventricular size is normal. No increase in right ventricular wall thickness. Right ventricular systolic function is normal. Tricuspid regurgitation signal is inadequate for assessing PA pressure. Left Atrium: Left atrial size was mildly dilated. Right Atrium: Right atrial size was normal in size. Pericardium: Trivial  pericardial effusion is present. The pericardial effusion is posterior to the left ventricle. Mitral Valve: The mitral valve is normal in structure. Trivial mitral valve regurgitation. Tricuspid Valve: The tricuspid  valve is normal in structure. Tricuspid valve regurgitation is not demonstrated. Aortic Valve: The aortic valve is tricuspid. Aortic valve regurgitation is not visualized. No aortic stenosis is present. Aortic valve mean gradient measures 3.0 mmHg. Aortic valve peak gradient measures 6.0 mmHg. Aortic valve area, by VTI measures 2.71 cm. Pulmonic Valve: The pulmonic valve was normal in structure. Pulmonic valve regurgitation is not visualized. Aorta: The aortic root and ascending aorta are structurally normal, with no evidence of dilitation. Venous: The inferior vena cava is normal in size with greater than 50% respiratory variability, suggesting right atrial pressure of 3 mmHg. IAS/Shunts: No atrial level shunt detected by color flow Doppler.  LEFT VENTRICLE PLAX 2D LVIDd:         5.10 cm      Diastology LVIDs:         3.50 cm      LV e' lateral:   15.90 cm/s LV PW:         1.10 cm      LV E/e' lateral: 5.9 LV IVS:        1.00 cm LVOT diam:     2.00 cm LV SV:         48 LV SV Index:   31 LVOT Area:     3.14 cm  LV Volumes (MOD) LV vol d, MOD A4C: 136.7 ml LV vol s, MOD A4C: 98.2 ml LV SV MOD A4C:     45.5 ml RIGHT VENTRICLE             IVC RV Basal diam:  3.30 cm     IVC diam: 1.00 cm RV S prime:     22.40 cm/s TAPSE (M-mode): 2.6 cm LEFT ATRIUM             Index       RIGHT ATRIUM          Index LA diam:        4.00 cm 2.60 cm/m  RA Area:     9.32 cm LA Vol (A2C):   62.8 ml 40.87 ml/m RA Volume:   18.80 ml 12.24 ml/m LA Vol (A4C):   83.1 ml 54.08 ml/m LA Biplane Vol: 76.1 ml 49.53 ml/m  AORTIC VALVE AV Area (Vmax):    2.63 cm AV Area (Vmean):   2.61 cm AV Area (VTI):     2.71 cm AV Vmax:           122.00 cm/s AV Vmean:          85.500 cm/s AV VTI:            0.176 m AV Peak Grad:      6.0  mmHg AV Mean Grad:      3.0 mmHg LVOT Vmax:         102.00 cm/s LVOT Vmean:        71.100 cm/s LVOT VTI:          0.152 m LVOT/AV VTI ratio: 0.86  AORTA Ao Root diam: 2.70 cm Ao Asc diam:  2.50 cm MITRAL VALVE MV Area (PHT): 3.97 cm    SHUNTS MV Decel Time: 191 msec    Systemic VTI:  0.15 m MV E velocity: 94.10 cm/s  Systemic Diam: 2.00 cm Rachelle Hora Croitoru MD Electronically signed by Thurmon Fair MD Signature Date/Time: 02/06/2021/9:53:39 AM    Final     Cardiac Studies    Patient Profile     25 y.o. female with takotsubo syndrome, sinus tach  Assessment & Plan    1.   Acute systolic CHF:   Echo is c/w Takotsubo syndrome.   Tolerating the toprol xl 12.5 mg QD well. Will possibly increase dose tomorrow  On ASA No symptoms of cp   2.   Sinus tach:  Improving with toprol XL   3.  Hyperemesis:  Cont zofran  4. Pregnancy :  Further management per OB      For questions or updates, please contact [facility] Please consult [email or web address] for contact info under        Signed, Kristeen Miss, MD  02/07/2021, 9:05 AM

## 2021-02-07 NOTE — Progress Notes (Signed)
Initial Nutrition Assessment  DOCUMENTATION CODES:   Underweight,Severe malnutrition in context of chronic illness  INTERVENTION:   - Boost Breeze po TID, each supplement provides 250 kcal and 9 grams of protein  - Prenatal MVI  - Encourage PO intake  NUTRITION DIAGNOSIS:   Severe Malnutrition related to social / environmental circumstances (polysubstance abuse) as evidenced by moderate fat depletion,severe muscle depletion,percent weight loss (12% weight loss in less than 6 months).  GOAL:   Patient will meet greater than or equal to 90% of their needs  MONITOR:   PO intake,Supplement acceptance,Labs,Weight trends,I & O's  REASON FOR ASSESSMENT:   Malnutrition Screening Tool    ASSESSMENT:   25 year old female who presented to the ED on 6/03 with N/V/D. Pt is currently [redacted]w[redacted]d pregnant and experiencing hyperemesis. PMH of anemia, GAD, MDD, substance abuse, endometriosis. Pt found to have acute systolic CHF, Takutsubo syndrome per Cardiology.   Spoke with pt at bedside. Visitor in room who pt identifies as her husband. Pt agreed to RD asking questions with visitor in room.  Pt reports very poor PO intake for about 4 days PTA. She states that anything solid that she would try to eat would "come back up" except for mashed potatoes. Pt was able to tolerate cranberry juice and unsweetened iced tea with lemon. Pt states that PO intake PTA would vary related to hyperemesis. Pt able to tolerate protein bars and a few other items. Pt's main issue was keeping food down. Pt states that initially she started "eating like a bird" back in December 2021 and that her current pregnancy contributed to ongoing poor appetite and PO intake.  Pt now on anti-emetic regimen which she reports is helping with her N/V. Pt states that she did vomit this AM but it was related to medication. She states that so far today she has consumed 2 bowls of wild rice soup from [employer], 1 bagel, a chai tea drink, and  some other food items today.  Pt endorses weight loss. She reports that her highest weight was 150 lbs when she was pregnant with her daughter (who was born in March 2021). She states that she stabilized at 125 lbs after the birth of her daughter but has now lost weight down to 118 lbs. Current weight in chart is 108.69 lbs.  Reviewed weight history available in chart. Pt's weight in December 2021 was 56 kg and current weight is 49.3 kg. This is a 6.7 kg weight loss (12% weight loss) in less than 6 months which is severe and significant for timeframe. Noted weight loss was progressive in nature as pt weighed 54.4 kg in April. Pt meets criteria for malnutrition.  Pt states that she was taking a prenatal MVI PTA. Pt willing to try clear liquid oral nutrition supplements. She reports that she was unable to tolerate the milky supplements like Ensure. RD will also order prenatal MVI.  Admit weight: 46.8 kg Current weight: 49.3 kg  Medications reviewed and include: pepcid, methadone, reglan, zofran, klor-con 40 mEq BID, pyridoxine injection, scopolamine patch, pepcid  Labs reviewed: potassium 3.1  UOP: 900 ml x 24 hours I/O's: +1.9 L since admit  NUTRITION - FOCUSED PHYSICAL EXAM:  Flowsheet Row Most Recent Value  Orbital Region Moderate depletion  Upper Arm Region Moderate depletion  Thoracic and Lumbar Region Moderate depletion  Buccal Region Moderate depletion  Temple Region Mild depletion  Clavicle Bone Region Severe depletion  Clavicle and Acromion Bone Region Severe depletion  Scapular Bone Region Moderate  depletion  Dorsal Hand Moderate depletion  Patellar Region Moderate depletion  Anterior Thigh Region Severe depletion  Posterior Calf Region Moderate depletion  Edema (RD Assessment) Mild  Hair Reviewed  Eyes Reviewed  Mouth Reviewed  Skin Reviewed  Nails Reviewed       Diet Order:   Diet Order            Diet regular Room service appropriate? Yes; Fluid consistency:  Thin  Diet effective now                 EDUCATION NEEDS:   Education needs have been addressed  Skin:  Skin Assessment: Reviewed RN Assessment  Last BM:  02/04/21  Height:   Ht Readings from Last 1 Encounters:  02/02/21 5\' 6"  (1.676 m)    Weight:   Wt Readings from Last 1 Encounters:  02/07/21 49.3 kg    BMI:  Body mass index is 17.54 kg/m.  Estimated Nutritional Needs:   Kcal:  1800-2000  Protein:  85-100 grams  Fluid:  >/= 1.8 L/day    04/09/21, MS, RD, LDN Inpatient Clinical Dietitian Please see AMiON for contact information.

## 2021-02-07 NOTE — Progress Notes (Addendum)
Patient ID: Gabrielle Nguyen, female   DOB: [birth year], 25 y.o.   MRN: [id number] [place] ANTEPARTUM COMPREHENSIVE PROGRESS NOTE  Gabrielle Nguyen is a 25 y.o. [id number] at [redacted]w[redacted]d who is admitted for hyperemsis.   Fetal presentation is unsure. Length of Stay:  3  Days  Subjective: Pt reports feeling much better. She experienced some emesis this morning immediately following her methadone dose. Overall, patient states that she feels much better. She tolerated a regular diet yesterday consisting of a full bowl of soup, mac and cheese, and shrimps. Patient denies cramping or vaginal bleeding. She denies chest pain, shortness of breath, of lightheadedness at rest or with ambulation.  Vitals:  Blood pressure 102/63, pulse (!) 102, temperature 97.9 F (36.6 C), temperature source Oral, resp. rate 16, height _0  (1.676 m), weight 49.3 kg, SpO2 98 %, unknown if currently breastfeeding.   Physical Examination: Lungs clear   Heart RRR Abd soft + BS Ext non tender   Labs:  Results for orders placed or performed during the hospital encounter of 02/02/21 (from the past 24 hour(s))  Sedimentation rate   Collection Time: 02/06/21  9:27 AM  Result Value Ref Range   Sed Rate 11 0 - 22 mm/hr  Brain natriuretic peptide   Collection Time: 02/06/21  9:27 AM  Result Value Ref Range   B Natriuretic Peptide 1,341.2 (H) 0.0 - 100.0 pg/mL  Troponin I (High Sensitivity)   Collection Time: 02/06/21  9:27 AM  Result Value Ref Range   Troponin I (High Sensitivity) 368 (HH) <18 ng/L  ECHOCARDIOGRAM COMPLETE   Collection Time: 02/06/21  9:28 AM  Result Value Ref Range   Weight 1,721.35 oz   Height 66 in   BP 87/52 mmHg   S' Lateral 3.50 cm   AR max vel 2.63 cm2   AV Area VTI 2.71 cm2   AV Mean grad 3.0 mmHg   AV Peak grad 6.0 mmHg   Ao pk vel 1.22 m/s   Area-P 1/2 3.97 cm2   AV Area mean vel 2.61 cm2   Single Plane A4C EF 28.2 %       Medications:  Scheduled . aspirin EC  81 mg Oral Daily   . famotidine  20 mg Oral Q12H  . methadone  130 mg Oral Daily  . metoCLOPramide  5 mg Oral Q6H   Or  . metoCLOPramide (REGLAN) injection  5 mg Intravenous Q6H  . metoprolol succinate  12.5 mg Oral BID  . ondansetron  4 mg Oral Q8H   Or  . ondansetron (ZOFRAN) IV  4 mg Intravenous Q8H   Or  . ondansetron (ZOFRAN) IV  4 mg Intravenous Q8H  . potassium chloride  40 mEq Oral BID  . pyridOXINE  100 mg Intravenous Daily  . scopolamine  1 patch Transdermal Q72H  . sertraline  150 mg Oral Daily   I have reviewed the patient's current medications.  ASSESSMENT/PLAN: IUP 13 5/7 Hyperemesis- significantly improved OUD- Continue methadone  Anxiety- prn medications Acute systolic CHF- Continue ASA and toprol titration  Obstetrically patient is stable for discharge home. Will arrange for prenatal appointment as well as a referral to ob cardiology with Dr. KBerniece SalinesContinue current management per cardiology recommendations Discharge planning pending cardiology clearance     Gabrielle Nguyen 02/07/2021,9:06 AM

## 2021-02-07 NOTE — Progress Notes (Addendum)
Advanced Heart Failure Rounding Note   Subjective:    Says her chest feels better. No CP or SOB. Remains anxious.  Still tachy. Tolerating toprol ok    Objective:   Weight Range:  Vital Signs:   Temp:  [97.5 F (36.4 C)-98.5 F (36.9 C)] 97.9 F (36.6 C) (06/08 0754) Pulse Rate:  [102-157] 102 (06/08 0754) Resp:  [14-18] 16 (06/08 0754) BP: (92-106)/(61-77) 102/63 (06/08 0754) SpO2:  [98 %-100 %] 98 % (06/08 0754) Weight:  [49.3 kg] 49.3 kg (06/08 0249) Last BM Date: 02/04/21  Weight change: Filed Weights   02/05/21 0530 02/06/21 0538 02/07/21 0249  Weight: 44.6 kg 48.8 kg 49.3 kg    Intake/Output:   Intake/Output Summary (Last 24 hours) at 02/07/2021 1033 Last data filed at 02/06/2021 2000 Gross per 24 hour  Intake 1780.13 ml  Output 900 ml  Net 880.13 ml     Physical Exam: General:  Sitting in bed No resp difficulty HEENT: normal Neck: supple. JVP . Carotids 2+ bilat; no bruits. No lymphadenopathy or thryomegaly appreciated. Cor: PMI nondisplaced. Tachy regular  No rubs, gallops or murmurs. Lungs: clear Abdomen: soft, nontender, nondistended. No hepatosplenomegaly. No bruits or masses. Good bowel sounds. Extremities: no cyanosis, clubbing, rash, edema Neuro: alert & orientedx3, cranial nerves grossly intact. moves all 4 extremities w/o difficulty. Affect pleasant  Telemetry: Sinus tach 100-110 +QT prolonged Personally reviewed   Labs: Basic Metabolic Panel: Recent Labs  Lab 02/02/21 1454 02/03/21 0747 02/05/21 1012 02/05/21 2259 02/06/21 0241  NA 137 134* 133*  --  135  K 3.2* 3.0* 2.9*  --  3.1*  CL 101 107 100  --  106  CO2 19* 18* 23  --  22  GLUCOSE 94 122* 97  --  89  BUN 7 <5* <5*  --  <5*  CREATININE 0.58 0.47 0.47  --  0.36*  CALCIUM 9.8 9.1 8.9  --  8.3*  MG  --   --   --  1.9  --     Liver Function Tests: Recent Labs  Lab 02/02/21 1454  AST 18  ALT 11  ALKPHOS 54  BILITOT 0.7  PROT 8.0  ALBUMIN 4.3   Recent Labs  Lab  02/02/21 1454  LIPASE 32   No results for input(s): AMMONIA in the last 168 hours.  CBC: Recent Labs  Lab 02/02/21 1454  WBC 7.4  NEUTROABS 6.8  HGB 10.6*  HCT 34.3*  MCV 79.8*  PLT 268    Cardiac Enzymes: No results for input(s): CKTOTAL, CKMB, CKMBINDEX, TROPONINI in the last 168 hours.  BNP: BNP (last 3 results) Recent Labs    02/06/21 0927  BNP 1,341.2*    ProBNP (last 3 results) No results for input(s): PROBNP in the last 8760 hours.    Other results:  Imaging: ECHOCARDIOGRAM COMPLETE  Result Date: 02/06/2021    ECHOCARDIOGRAM REPORT   Patient Name:   Gabrielle Nguyen Date of Exam: 02/06/2021 Medical Rec #:  [id number]     Height:       66.0 in Accession #:    [id number]    Weight:       107.6 lb Date of Birth:  [birth year]    BSA:          1.537 m Patient Age:    24 years      BP:           87/52 mmHg Patient Gender: F  HR:           118 bpm. Exam Location:  Inpatient Procedure: 2D Echo, Cardiac Doppler and Color Doppler Indications:    Abnormal ECG  History:        Patient has no prior history of Echocardiogram examinations.  Sonographer:    Shirlean Kelly Referring Phys: [id number] JACOB J STINSON IMPRESSIONS  1. There is no left ventricular thrombus seen with Definity contrast. There is severe hypokinesis of the apical 2/3 of the left ventrcile with preserved basal contractility. 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 Left ventricular ejection fraction, by estimation, is 30 to 35%. The left ventricle has moderately decreased function. The left ventricle demonstrates regional wall motion abnormalities (see scoring diagram/findings for description). Indeterminate diastolic filling due to E-A fusion.  2. Right ventricular systolic function is normal. The right ventricular size is normal. Tricuspid regurgitation signal is inadequate for assessing PA pressure.  3. Left atrial size was mildly dilated.  4. The pericardial effusion is posterior to  the left ventricle.  5. The mitral valve is normal in structure. Trivial mitral valve regurgitation.  6. The aortic valve is tricuspid. Aortic valve regurgitation is not visualized. No aortic stenosis is present.  7. The inferior vena cava is normal in size with greater than 50% respiratory variability, suggesting right atrial pressure of 3 mmHg. FINDINGS  Left Ventricle: There is no left ventricular thrombus seen with Definity contrast. There is severe hypokinesis of the apical 2/3 of the left ventrcile with preserved basal contractility. 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  Left ventricular ejection fraction, by estimation, is 30 to 35%. The left ventricle has moderately decreased function. The left ventricle demonstrates regional wall motion abnormalities. The left ventricular internal cavity size was normal in size. There is no left ventricular hypertrophy. Indeterminate diastolic filling due to E-A fusion. Right Ventricle: The right ventricular size is normal. No increase in right ventricular wall thickness. Right ventricular systolic function is normal. Tricuspid regurgitation signal is inadequate for assessing PA pressure. Left Atrium: Left atrial size was mildly dilated. Right Atrium: Right atrial size was normal in size. Pericardium: Trivial pericardial effusion is present. The pericardial effusion is posterior to the left ventricle. Mitral Valve: The mitral valve is normal in structure. Trivial mitral valve regurgitation. Tricuspid Valve: The tricuspid valve is normal in structure. Tricuspid valve regurgitation is not demonstrated. Aortic Valve: The aortic valve is tricuspid. Aortic valve regurgitation is not visualized. No aortic stenosis is present. Aortic valve mean gradient measures 3.0 mmHg. Aortic valve peak gradient measures 6.0 mmHg. Aortic valve area, by VTI measures 2.71 cm. Pulmonic Valve: The pulmonic valve was normal in structure. Pulmonic valve regurgitation is  not visualized. Aorta: The aortic root and ascending aorta are structurally normal, with no evidence of dilitation. Venous: The inferior vena cava is normal in size with greater than 50% respiratory variability, suggesting right atrial pressure of 3 mmHg. IAS/Shunts: No atrial level shunt detected by color flow Doppler.  LEFT VENTRICLE PLAX 2D LVIDd:         5.10 cm      Diastology LVIDs:         3.50 cm      LV e' lateral:   15.90 cm/s LV PW:         1.10 cm      LV E/e' lateral: 5.9 LV IVS:        1.00 cm LVOT diam:     2.00 cm LV SV:  48 LV SV Index:   31 LVOT Area:     3.14 cm  LV Volumes (MOD) LV vol d, MOD A4C: 136.7 ml LV vol s, MOD A4C: 98.2 ml LV SV MOD A4C:     45.5 ml RIGHT VENTRICLE             IVC RV Basal diam:  3.30 cm     IVC diam: 1.00 cm RV S prime:     22.40 cm/s TAPSE (M-mode): 2.6 cm LEFT ATRIUM             Index       RIGHT ATRIUM          Index LA diam:        4.00 cm 2.60 cm/m  RA Area:     9.32 cm LA Vol (A2C):   62.8 ml 40.87 ml/m RA Volume:   18.80 ml 12.24 ml/m LA Vol (A4C):   83.1 ml 54.08 ml/m LA Biplane Vol: 76.1 ml 49.53 ml/m  AORTIC VALVE AV Area (Vmax):    2.63 cm AV Area (Vmean):   2.61 cm AV Area (VTI):     2.71 cm AV Vmax:           122.00 cm/s AV Vmean:          85.500 cm/s AV VTI:            0.176 m AV Peak Grad:      6.0 mmHg AV Mean Grad:      3.0 mmHg LVOT Vmax:         102.00 cm/s LVOT Vmean:        71.100 cm/s LVOT VTI:          0.152 m LVOT/AV VTI ratio: 0.86  AORTA Ao Root diam: 2.70 cm Ao Asc diam:  2.50 cm MITRAL VALVE MV Area (PHT): 3.97 cm    SHUNTS MV Decel Time: 191 msec    Systemic VTI:  0.15 m MV E velocity: 94.10 cm/s  Systemic Diam: 2.00 cm Rachelle Hora Croitoru MD Electronically signed by Thurmon Fair MD Signature Date/Time: 02/06/2021/9:53:39 AM    Final       Medications:     Scheduled Medications: . aspirin EC  81 mg Oral Daily  . famotidine  20 mg Oral Q12H  . methadone  130 mg Oral Daily  . metoCLOPramide  5 mg Oral Q6H   Or  .  metoCLOPramide (REGLAN) injection  5 mg Intravenous Q6H  . metoprolol succinate  12.5 mg Oral BID  . ondansetron  4 mg Oral Q8H   Or  . ondansetron (ZOFRAN) IV  4 mg Intravenous Q8H   Or  . ondansetron (ZOFRAN) IV  4 mg Intravenous Q8H  . potassium chloride  40 mEq Oral BID  . pyridOXINE  100 mg Intravenous Daily  . scopolamine  1 patch Transdermal Q72H  . sertraline  75 mg Oral Daily     Infusions: . famotidine (PEPCID) IV 20 mg (02/06/21 1150)     PRN Medications:  acetaminophen, ALPRAZolam, hydrOXYzine   Assessment/Plan:   1. 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 QT prolongation - more prominent on ECG today. 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 Should improve soon  - Cut sertraline back and follow daily ECG - Discussed with pharmD  3. Opiate use - UDS + opiates  - On methadone will need to continue.   4. Anxiety   5. Pregnancy 13 wk 5/7  I will follow in the HF Clinic post discharge (she is not ready for d/c yet).     Length of Stay: 3   Arvilla Meres MD 02/07/2021, 10:33 AM  Advanced Heart Failure Team Pager [phone] (M-F; 7a - 4p)  Please contact [facility] for night-coverage after hours (4p -7a ) and weekends on [email or web address]

## 2021-02-07 NOTE — Plan of Care (Signed)
  Problem: Education: Goal: Knowledge of General Education information will improve Description: Including pain rating scale, medication(s)/side effects and non-pharmacologic comfort measures Outcome: Progressing   Problem: Health Behavior/Discharge Planning: Goal: Ability to manage health-related needs will improve Outcome: Progressing   Problem: Clinical Measurements: Goal: Ability to maintain clinical measurements within normal limits will improve Outcome: Progressing Goal: Will remain free from infection Outcome: Progressing Goal: Diagnostic test results will improve Outcome: Progressing Goal: Respiratory complications will improve Outcome: Progressing Goal: Cardiovascular complication will be avoided Outcome: Progressing   Problem: Activity: Goal: Risk for activity intolerance will decrease Outcome: Progressing   Problem: Nutrition: Goal: Adequate nutrition will be maintained Outcome: Progressing   Problem: Coping: Goal: Level of anxiety will decrease Outcome: Progressing   Problem: Elimination: Goal: Will not experience complications related to bowel motility Outcome: Progressing Goal: Will not experience complications related to urinary retention Outcome: Progressing   Problem: Pain Managment: Goal: General experience of comfort will improve Outcome: Progressing   Problem: Safety: Goal: Ability to remain free from injury will improve Outcome: Progressing   Problem: Skin Integrity: Goal: Risk for impaired skin integrity will decrease Outcome: Progressing   Problem: Education: Goal: Knowledge of disease or condition will improve Outcome: Progressing Goal: Knowledge of the prescribed therapeutic regimen will improve Outcome: Progressing Goal: Individualized Educational Video(s) Outcome: Progressing   Problem: Clinical Measurements: Goal: Complications related to the disease process, condition or treatment will be avoided or minimized Outcome:  Progressing   Problem: Education: Goal: Knowledge of disease or condition will improve Outcome: Progressing Goal: Knowledge of the prescribed therapeutic regimen will improve Outcome: Progressing   Problem: Bowel/Gastric: Goal: Occurences of nausea and/or vomiting will decrease Outcome: Progressing   Problem: Fluid Volume: Goal: Maintenance of adequate hydration will improve Outcome: Progressing   Problem: Nutritional: Goal: Achievement of adequate weight for body size and type will improve Outcome: Progressing   

## 2021-02-08 ENCOUNTER — Inpatient Hospital Stay (HOSPITAL_COMMUNITY): Payer: Medicaid Other

## 2021-02-08 ENCOUNTER — Other Ambulatory Visit: Payer: Self-pay | Admitting: Obstetrics and Gynecology

## 2021-02-08 ENCOUNTER — Telehealth: Payer: Self-pay | Admitting: *Deleted

## 2021-02-08 DIAGNOSIS — I5021 Acute systolic (congestive) heart failure: Secondary | ICD-10-CM

## 2021-02-08 LAB — ECHOCARDIOGRAM LIMITED
AR max vel: 3 cm2
AV Area VTI: 2.91 cm2
AV Area mean vel: 2.9 cm2
AV Mean grad: 2 mmHg
AV Peak grad: 3.7 mmHg
Ao pk vel: 0.97 m/s
Area-P 1/2: 4.33 cm2
Height: 66 in
S' Lateral: 3.2 cm
Weight: 1710.4 oz

## 2021-02-08 LAB — BASIC METABOLIC PANEL
Anion gap: 8 (ref 5–15)
BUN: 5 mg/dL — ABNORMAL LOW (ref 6–20)
CO2: 28 mmol/L (ref 22–32)
Calcium: 8.8 mg/dL — ABNORMAL LOW (ref 8.9–10.3)
Chloride: 98 mmol/L (ref 98–111)
Creatinine, Ser: 0.44 mg/dL (ref 0.44–1.00)
GFR, Estimated: >60 mL/min (ref >60)
Glucose, Bld: 91 mg/dL (ref 70–99)
Potassium: 3.8 mmol/L (ref 3.5–5.1)
Sodium: 134 mmol/L — ABNORMAL LOW (ref 135–145)

## 2021-02-08 MED ORDER — SERTRALINE HCL 25 MG PO TABS: 75.0000 mg | ORAL_TABLET | Freq: Every day | ORAL | 3 refills | Status: DC

## 2021-02-08 MED ORDER — METHADONE HCL 10 MG PO TABS
130.0000 mg | ORAL_TABLET | Freq: Every day | ORAL | Status: DC
  Administered 2021-02-08: 130 mg via ORAL
  Filled 2021-02-08: qty 13

## 2021-02-08 MED ORDER — ALPRAZOLAM 1 MG PO TABS: 1.0000 mg | ORAL_TABLET | Freq: Two times a day (BID) | ORAL | 0 refills | Status: DC | PRN

## 2021-02-08 MED ORDER — ASPIRIN 81 MG PO TBEC: 81.0000 mg | DELAYED_RELEASE_TABLET | Freq: Every day | ORAL | 11 refills | Status: DC

## 2021-02-08 MED ORDER — FAMOTIDINE 20 MG PO TABS: 20.0000 mg | ORAL_TABLET | Freq: Two times a day (BID) | ORAL | 3 refills | Status: DC

## 2021-02-08 MED ORDER — SCOPOLAMINE 1 MG/3DAYS TD PT72: 1.0000 | MEDICATED_PATCH | TRANSDERMAL | 1 refills | Status: DC

## 2021-02-08 MED ORDER — PREPLUS 27-1 MG PO TABS: 1.0000 | ORAL_TABLET | Freq: Every day | ORAL | 13 refills | Status: DC

## 2021-02-08 MED ORDER — METOPROLOL SUCCINATE ER 25 MG PO TB24: 25.0000 mg | ORAL_TABLET | Freq: Two times a day (BID) | ORAL | 3 refills | Status: DC

## 2021-02-08 MED ORDER — POTASSIUM CHLORIDE CRYS ER 20 MEQ PO TBCR: 20.0000 meq | EXTENDED_RELEASE_TABLET | Freq: Two times a day (BID) | ORAL | 0 refills | Status: DC

## 2021-02-08 MED ORDER — ONDANSETRON 4 MG PO TBDP
4.0000 mg | ORAL_TABLET | Freq: Three times a day (TID) | ORAL | 0 refills | Status: DC
Start: 2021-02-08 — End: 2021-02-19

## 2021-02-08 MED ORDER — HYDROXYZINE PAMOATE 25 MG PO CAPS: 25.0000 mg | ORAL_CAPSULE | Freq: Three times a day (TID) | ORAL | 0 refills | Status: DC | PRN

## 2021-02-08 MED ORDER — METOCLOPRAMIDE HCL 5 MG PO TABS: 5.0000 mg | ORAL_TABLET | Freq: Four times a day (QID) | ORAL | 3 refills | Status: DC

## 2021-02-08 NOTE — Telephone Encounter (Signed)
Received a voice message from today at 1:23 pm from  a woman stating she is Rosalita Chessman from [facility] . States Luther Parody is a patient of Dr. Jolayne Panther and is being discharged and has questions for Dr. Jolayne Panther. I called Dr. Jolayne Panther and she will follow up with [place]. Yesli Vanderhoff,RN

## 2021-02-08 NOTE — TOC Transition Note (Addendum)
Transition of Care [facility]) - CM/SW Discharge Note Heart Failure   Patient Details  Name: Gabrielle Nguyen MRN: [id number] Date of Birth: 26-Jul-1996  Transition of Care [facility]) CM/SW Contact:  Jeff Frieden, LCSWA Phone Number: 02/08/2021, 12:35 PM   Clinical Narrative:    CSW spoke with the patient at bedside to bring them an appointment card for the [facility] outpatient clinic and encouraged them to follow up and to attend the appointment and bring their medications and if anything changes to please reach out so that CSW/HV clinic team can provide support. Patient reported she does have transportation to the appointment and does have a ride home for discharge today.  CSW will sign off for now as social work intervention is no longer needed. Please consult [place] again if new needs arise.    Final next level of care: Home/Self Care Barriers to Discharge: No Barriers Identified   Patient Goals and CMS Choice        Discharge Placement                       Discharge Plan and Services In-house Referral: Clinical Social Work                                   Social Determinants of Health (SDOH) Interventions Food Insecurity Interventions: Other (Comment) (Pt. reports receiving food stamps she just needs to follow up with DSS to get them restarted and provide them with paperwork. pt. declined needing assistance with this) Financial Strain Interventions: Other (Comment) (Referral to outpatient HV clinic for F/U) Housing Interventions: Other (Comment) (Referral to [facility] outpatient clinic for F/U) Transportation Interventions: Intervention Not Indicated   Readmission Risk Interventions No flowsheet data found.  Srihith Aquilino, MSW, LCSWA [phone] Heart Failure Social Worker

## 2021-02-08 NOTE — Progress Notes (Signed)
Progress Note  Patient Name: Gabrielle Nguyen Date of Encounter: 02/08/2021  [facility] Cardiologist: Gabrielle Nguyen   Subjective   25 yo with hyperemesis, sinus tach and takotsubo syndrome.  HR is much better / slower . Toprol xl was increased to 25 mg a day last night Is tolerating it very well She feels much better   I appreciate the consult / assistance  by the Advanced CHF team .   Getting limited echo now. Her EF is still reduced but the distall ant. Septal wall is contracting better than 2 days ago   Inpatient Medications    Scheduled Meds:  aspirin EC  81 mg Oral Daily   famotidine  20 mg Oral Q12H   feeding supplement  1 Container Oral TID BM   methadone  130 mg Oral Daily   metoCLOPramide  5 mg Oral Q6H   Or   metoCLOPramide (REGLAN) injection  5 mg Intravenous Q6H   metoprolol succinate  25 mg Oral BID   ondansetron  4 mg Oral Q8H   Or   ondansetron (ZOFRAN) IV  4 mg Intravenous Q8H   potassium chloride  40 mEq Oral BID   prenatal multivitamin  1 tablet Oral Q1200   pyridOXINE  100 mg Intravenous Daily   scopolamine  1 patch Transdermal Q72H   sertraline  75 mg Oral Daily   Continuous Infusions:  famotidine (PEPCID) IV 100 mL/hr at 02/08/21 0700   PRN Meds: acetaminophen, ALPRAZolam, hydrOXYzine   Vital Signs    Vitals:   02/07/21 2359 02/08/21 0401 02/08/21 0640 02/08/21 0728  BP: (!) 88/64 (!) 84/61  (!) 91/58  Pulse: 90 87    Resp: 12 13  13   Temp: 97.6 F (36.4 C) 98.3 F (36.8 C)  97.9 F (36.6 C)  TempSrc: Oral Oral    SpO2: 97% 93%    Weight:   48.5 kg   Height:        Intake/Output Summary (Last 24 hours) at 02/08/2021 0813 Last data filed at 02/08/2021 0700 Gross per 24 hour  Intake 360 ml  Output --  Net 360 ml    Last 3 Weights 02/08/2021 02/07/2021 02/06/2021  Weight (lbs) 106 lb 14.4 oz 108 lb 11 oz 107 lb 9.4 oz  Weight (kg) 48.49 kg 49.3 kg 48.8 kg  Some encounter information is confidential and restricted. Go to Review Flowsheets  activity to see all data.      Telemetry    NSR at 75  ECG     - Personally Reviewed  Physical Exam   Physical Exam: Blood pressure (!) 91/58, pulse 87, temperature 97.9 F (36.6 C), resp. rate 13, height 5\' 6"  (1.676 m), weight 48.5 kg, SpO2 93 %, unknown if currently breastfeeding.  GEN:   thin female, NAD  HEENT: Normal NECK: No JVD; No carotid bruits LYMPHATICS: No lymphadenopathy CARDIAC:  RR , soft systolic murmur  RESPIRATORY:  Clear to auscultation without rales, wheezing or rhonchi  ABDOMEN: Soft, non-tender, non-distended MUSCULOSKELETAL:  No edema; No deformity  SKIN: Warm and dry NEUROLOGIC:  Alert and oriented x 3   Labs    High Sensitivity Troponin:   Recent Labs  Lab 02/05/21 2259 02/06/21 0241 02/06/21 0927  TROPONINIHS 429* 497* 368*       Chemistry Recent Labs  Lab 02/02/21 1454 02/03/21 0747 02/05/21 1012 02/06/21 0241 02/07/21 0939  NA 137   < > 133* 135 135  K 3.2*   < > 2.9* 3.1* 3.1*  CL 101   < > 100 106 100  CO2 19*   < > 23 22 24   GLUCOSE 94   < > 97 89 88  BUN 7   < > <5* <5* <5*  CREATININE 0.58   < > 0.47 0.36* 0.33*  CALCIUM 9.8   < > 8.9 8.3* 8.5*  PROT 8.0  --   --   --   --   ALBUMIN 4.3  --   --   --   --   AST 18  --   --   --   --   ALT 11  --   --   --   --   ALKPHOS 54  --   --   --   --   BILITOT 0.7  --   --   --   --   GFRNONAA >60   < > >60 >60 >60  ANIONGAP 17*   < > 10 7 11    < > = values in this interval not displayed.      Hematology Recent Labs  Lab 02/02/21 1454  WBC 7.4  RBC 4.30  HGB 10.6*  HCT 34.3*  MCV 79.8*  MCH 24.7*  MCHC 30.9  RDW 16.5*  PLT 268     BNP Recent Labs  Lab 02/06/21 0927  BNP 1,341.2*      DDimer No results for input(s): DDIMER in the last 168 hours.   Radiology    ECHOCARDIOGRAM COMPLETE  Result Date: 02/06/2021    ECHOCARDIOGRAM REPORT   Patient Name:   Gabrielle Nguyen Date of Exam: 02/06/2021 Medical Rec #:  [id number]     Height:       66.0 in  Accession #:    [id number]    Weight:       107.6 lb Date of Birth:  October 02, 1995    BSA:          1.537 m Patient Age:    24 years      BP:           87/52 mmHg Patient Gender: F             HR:           118 bpm. Exam Location:  Inpatient Procedure: 2D Echo, Cardiac Doppler and Color Doppler Indications:    Abnormal ECG  History:        Patient has no prior history of Echocardiogram examinations.  Sonographer:    Shirlean Kelly Referring Phys: [phone] Gabrielle Nguyen IMPRESSIONS  1. There is no left ventricular thrombus seen with Definity contrast. There is severe hypokinesis of the apical 2/3 of the left ventrcile with preserved basal contractility. 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 Left ventricular ejection fraction, by estimation, is 30 to 35%. The left ventricle has moderately decreased function. The left ventricle demonstrates regional wall motion abnormalities (see scoring diagram/findings for description). Indeterminate diastolic filling due to E-A fusion.  2. Right ventricular systolic function is normal. The right ventricular size is normal. Tricuspid regurgitation signal is inadequate for assessing PA pressure.  3. Left atrial size was mildly dilated.  4. The pericardial effusion is posterior to the left ventricle.  5. The mitral valve is normal in structure. Trivial mitral valve regurgitation.  6. The aortic valve is tricuspid. Aortic valve regurgitation is not visualized. No aortic stenosis is present.  7. The inferior vena cava is normal in size with greater than 50% respiratory variability, suggesting  right atrial pressure of 3 mmHg. FINDINGS  Left Ventricle: There is no left ventricular thrombus seen with Definity contrast. There is severe hypokinesis of the apical 2/3 of the left ventrcile with preserved basal contractility. 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  Left ventricular ejection fraction, by estimation, is 30 to 35%. The left  ventricle has moderately decreased function. The left ventricle demonstrates regional wall motion abnormalities. The left ventricular internal cavity size was normal in size. There is no left ventricular hypertrophy. Indeterminate diastolic filling due to E-A fusion. Right Ventricle: The right ventricular size is normal. No increase in right ventricular wall thickness. Right ventricular systolic function is normal. Tricuspid regurgitation signal is inadequate for assessing PA pressure. Left Atrium: Left atrial size was mildly dilated. Right Atrium: Right atrial size was normal in size. Pericardium: Trivial pericardial effusion is present. The pericardial effusion is posterior to the left ventricle. Mitral Valve: The mitral valve is normal in structure. Trivial mitral valve regurgitation. Tricuspid Valve: The tricuspid valve is normal in structure. Tricuspid valve regurgitation is not demonstrated. Aortic Valve: The aortic valve is tricuspid. Aortic valve regurgitation is not visualized. No aortic stenosis is present. Aortic valve mean gradient measures 3.0 mmHg. Aortic valve peak gradient measures 6.0 mmHg. Aortic valve area, by VTI measures 2.71 cm. Pulmonic Valve: The pulmonic valve was normal in structure. Pulmonic valve regurgitation is not visualized. Aorta: The aortic root and ascending aorta are structurally normal, with no evidence of dilitation. Venous: The inferior vena cava is normal in size with greater than 50% respiratory variability, suggesting right atrial pressure of 3 mmHg. IAS/Shunts: No atrial level shunt detected by color flow Doppler.  LEFT VENTRICLE PLAX 2D LVIDd:         5.10 cm      Diastology LVIDs:         3.50 cm      LV e' lateral:   15.90 cm/s LV PW:         1.10 cm      LV E/e' lateral: 5.9 LV IVS:        1.00 cm LVOT diam:     2.00 cm LV SV:         48 LV SV Index:   31 LVOT Area:     3.14 cm  LV Volumes (MOD) LV vol d, MOD A4C: 136.7 ml LV vol s, MOD A4C: 98.2 ml LV SV MOD A4C:      45.5 ml RIGHT VENTRICLE             IVC RV Basal diam:  3.30 cm     IVC diam: 1.00 cm RV S prime:     22.40 cm/s TAPSE (M-mode): 2.6 cm LEFT ATRIUM             Index       RIGHT ATRIUM          Index LA diam:        4.00 cm 2.60 cm/m  RA Area:     9.32 cm LA Vol (A2C):   62.8 ml 40.87 ml/m RA Volume:   18.80 ml 12.24 ml/m LA Vol (A4C):   83.1 ml 54.08 ml/m LA Biplane Vol: 76.1 ml 49.53 ml/m  AORTIC VALVE AV Area (Vmax):    2.63 cm AV Area (Vmean):   2.61 cm AV Area (VTI):     2.71 cm AV Vmax:           122.00 cm/s AV Vmean:  85.500 cm/s AV VTI:            0.176 m AV Peak Grad:      6.0 mmHg AV Mean Grad:      3.0 mmHg LVOT Vmax:         102.00 cm/s LVOT Vmean:        71.100 cm/s LVOT VTI:          0.152 m LVOT/AV VTI ratio: 0.86  AORTA Ao Root diam: 2.70 cm Ao Asc diam:  2.50 cm MITRAL VALVE MV Area (PHT): 3.97 cm    SHUNTS MV Decel Time: 191 msec    Systemic VTI:  0.15 m MV E velocity: 94.10 cm/s  Systemic Diam: 2.00 cm Rachelle Hora Croitoru MD Electronically signed by Thurmon Fair MD Signature Date/Time: 02/06/2021/9:53:39 AM    Final      Cardiac Studies    Patient Profile     25 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   Acute systolic CHF:   Takotsub syndrome. Is improving on Toprol XL Will have her follow up in the advanced CHF clinic  2.   Sinus tach:  Imuch better on the toprol   3.  Hyperemesis:  Cont zofran  4. Pregnancy :  Further management per OB      For questions or updates, please contact [facility] Please consult [email or web address] for contact info under    She may be discharged today     Signed, Kristeen Miss, MD  02/08/2021, 8:13 AM

## 2021-02-08 NOTE — Discharge Summary (Signed)
Physician Discharge Summary  Patient ID: Gabrielle Nguyen MRN: [id number] DOB/AGE: [birth year] 27 y.o.  Admit date: 02/02/2021 Discharge date: 02/08/2021  Admission Diagnoses:  Discharge Diagnoses:  Active Problems:   Anxiety with depression   Opioid use disorder   Hyperemesis affecting pregnancy, antepartum   Hyperemesis   Nausea and vomiting in pregnancy   Prolonged Q-T interval on ECG   Elevated troponin   Acute systolic CHF (congestive heart failure) (HCC)   Takotsubo cardiomyopathy   Discharged Condition: good  Hospital Course: 25 yo [id number] at 13w pregnancy admitted with hyperemesis. Patient receive IV hydration and antiemetic. It was noted that tachycardia thought to be associated with dehydration persistent and cardiology was consulted. Maternal echo revealed an EF 30% and patient transferred to telemetry floor. She was started on Toprol which improved her heart rate. Her hyperemesis improved over the course of her admission. Patient also has a history of opiod use disorder and was maintained on her methadone dose daily. Patient was found stable for discharge home with plans to follow up with Cardiology on 6/20 and to start prenatal care at [facility] on 03/01/21. Precautions were reviewed with the patient  Consults: cardiology   Discharge Exam: Blood pressure 92/61, pulse 87, temperature 97.8 F (36.6 C), temperature source Oral, resp. rate 13, height 5\' 6"  (1.676 m), weight 48.5 kg, SpO2 93 %, unknown if currently breastfeeding. GENERAL: Well-developed, well-nourished female in no acute distress.  LUNGS: Clear to auscultation bilaterally.  HEART: Regular rate and rhythm. ABDOMEN: Soft, nontender, nondistended. No organomegaly. EXTREMITIES: No cyanosis, clubbing, or edema, 2+ distal pulses.   Disposition:  There are no questions and answers to display.        Allergies as of 02/08/2021   No Known Allergies      Medication List     STOP taking these medications     ALPRAZolam 1 MG tablet Commonly known as: XANAX   clonazePAM 1 MG tablet Commonly known as: KLONOPIN   potassium chloride 20 MEQ packet Commonly known as: Klor-Con   prazosin 1 MG capsule Commonly known as: MINIPRESS       TAKE these medications    albuterol 108 (90 Base) MCG/ACT inhaler Commonly known as: VENTOLIN HFA Inhale 1 puff into the lungs every 6 (six) hours as needed for wheezing or shortness of breath.   APAP 325 MG tablet Take 2 tablets (650 mg total) by mouth every 6 (six) hours as needed for mild pain or headache.   aspirin 81 MG EC tablet Take 1 tablet (81 mg total) by mouth daily. Swallow whole. Start taking on: February 09, 2021   famotidine 20 MG tablet Commonly known as: PEPCID Take 1 tablet (20 mg total) by mouth every 12 (twelve) hours.   hydrOXYzine 25 MG capsule Commonly known as: Vistaril Take 1 capsule (25 mg total) by mouth 3 (three) times daily as needed for anxiety.   methadone 10 MG/ML solution Commonly known as: DOLOPHINE Take 130 mg by mouth daily. Verified with on call personnel for [facility] of Crowder,Christina. Last dose was 08-23-20   metoCLOPramide 5 MG tablet Commonly known as: REGLAN Take 1 tablet (5 mg total) by mouth every 6 (six) hours.   metoprolol succinate 25 MG 24 hr tablet Commonly known as: TOPROL-XL Take 1 tablet (25 mg total) by mouth 2 (two) times daily.   naloxone 2 MG/2ML injection Commonly known as: NARCAN Place 2 mg into the nose as needed (overdose).   ondansetron 4 MG disintegrating tablet  Commonly known as: ZOFRAN-ODT Take 1 tablet (4 mg total) by mouth every 8 (eight) hours. What changed:  when to take this reasons to take this   potassium chloride SA 20 MEQ tablet Commonly known as: KLOR-CON Take 1 tablet (20 mEq total) by mouth 2 (two) times daily.   PrePLUS 27-1 MG Tabs Take 1 tablet by mouth daily.   scopolamine 1 MG/3DAYS Commonly known as: TRANSDERM-SCOP Place 1 patch  (1.5 mg total) onto the skin every 3 (three) days.   sertraline 25 MG tablet Commonly known as: ZOLOFT Take 3 tablets (75 mg total) by mouth daily. What changed:  medication strength how much to take when to take this        Follow-up Information     [facility] Follow up on 02/19/2021.   Specialty: Cardiology Why: at 3:30 Located at [facility]. Free [employer].Entrance C. Contact information: [address] [id number] Wilhemina Bonito [place] [address] [phone]        Center for [facility] Follow up on 03/01/2021.   Specialty: Obstetrics and Gynecology Why: First prenatal appointment scheduled at 8:55am. Please arrive 15 minutes prior to your appointment Contact information: [address] [place] [address] [phone]                Signed: Catalina Antigua 02/08/2021, 12:06 PM

## 2021-02-08 NOTE — Progress Notes (Addendum)
Advanced Heart Failure Rounding Note   Subjective:   02/07/21 Toprol XL increased to 25 mg twice a day. Sertraline cut back due to prolonged QTc.    Feeling better. Denies SOB.   Objective:   Weight Range:  Vital Signs:   Temp:  [97.6 F (36.4 C)-98.3 F (36.8 C)] 97.9 F (36.6 C) (06/09 0728) Pulse Rate:  [87-106] 87 (06/09 0401) Resp:  [10-16] 13 (06/09 0728) BP: (84-102)/(58-72) 91/58 (06/09 0728) SpO2:  [93 %-98 %] 93 % (06/09 0401) Weight:  [48.5 kg] 48.5 kg (06/09 0640) Last BM Date: 02/04/21  Weight change: Filed Weights   02/06/21 0538 02/07/21 0249 02/08/21 0640  Weight: 48.8 kg 49.3 kg 48.5 kg    Intake/Output:   Intake/Output Summary (Last 24 hours) at 02/08/2021 0744 Last data filed at 02/08/2021 0700 Gross per 24 hour  Intake 360 ml  Output --  Net 360 ml     Physical Exam: General:  Thin . No resp difficulty HEENT: normal Neck: supple. no JVD. Carotids 2+ bilat; no bruits. No lymphadenopathy or thryomegaly appreciated. Cor: PMI nondisplaced. Regular rate & rhythm. No rubs, gallops or murmurs. Lungs: clear Abdomen: soft, nontender, nondistended. No hepatosplenomegaly. No bruits or masses. Good bowel sounds. Extremities: no cyanosis, clubbing, rash, edema Neuro: alert & orientedx3, cranial nerves grossly intact. moves all 4 extremities w/o difficulty. Affect pleasant  Telemetry: SR-ST 80-100s  EKG : ST 70 bpm QTc 477  Labs: Basic Metabolic Panel: Recent Labs  Lab 02/02/21 1454 02/03/21 0747 02/05/21 1012 02/05/21 2259 02/06/21 0241 02/07/21 0939  NA 137 134* 133*  --  135 135  K 3.2* 3.0* 2.9*  --  3.1* 3.1*  CL 101 107 100  --  106 100  CO2 19* 18* 23  --  22 24  GLUCOSE 94 122* 97  --  89 88  BUN 7 <5* <5*  --  <5* <5*  CREATININE 0.58 0.47 0.47  --  0.36* 0.33*  CALCIUM 9.8 9.1 8.9  --  8.3* 8.5*  MG  --   --   --  1.9  --   --     Liver Function Tests: Recent Labs  Lab 02/02/21 1454  AST 18  ALT 11  ALKPHOS 54  BILITOT  0.7  PROT 8.0  ALBUMIN 4.3   Recent Labs  Lab 02/02/21 1454  LIPASE 32   No results for input(s): AMMONIA in the last 168 hours.  CBC: Recent Labs  Lab 02/02/21 1454  WBC 7.4  NEUTROABS 6.8  HGB 10.6*  HCT 34.3*  MCV 79.8*  PLT 268    Cardiac Enzymes: No results for input(s): CKTOTAL, CKMB, CKMBINDEX, TROPONINI in the last 168 hours.  BNP: BNP (last 3 results) Recent Labs    02/06/21 0927  BNP 1,341.2*    ProBNP (last 3 results) No results for input(s): PROBNP in the last 8760 hours.    Other results:  Imaging: ECHOCARDIOGRAM COMPLETE  Result Date: 02/06/2021    ECHOCARDIOGRAM REPORT   Patient Name:   ROLLA SERVIDIO Date of Exam: 02/06/2021 Medical Rec #:  [id number]     Height:       66.0 in Accession #:    [id number]    Weight:       107.6 lb Date of Birth:  09/03/1995    BSA:          1.537 m Patient Age:    24 years      BP:  87/52 mmHg Patient Gender: F             HR:           118 bpm. Exam Location:  Inpatient Procedure: 2D Echo, Cardiac Doppler and Color Doppler Indications:    Abnormal ECG  History:        Patient has no prior history of Echocardiogram examinations.  Sonographer:    Shirlean Kelly Referring Phys: [phone] JACOB J STINSON IMPRESSIONS  1. There is no left ventricular thrombus seen with Definity contrast. There is severe hypokinesis of the apical 2/3 of the left ventrcile with preserved basal contractility. 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 Left ventricular ejection fraction, by estimation, is 30 to 35%. The left ventricle has moderately decreased function. The left ventricle demonstrates regional wall motion abnormalities (see scoring diagram/findings for description). Indeterminate diastolic filling due to E-A fusion.  2. Right ventricular systolic function is normal. The right ventricular size is normal. Tricuspid regurgitation signal is inadequate for assessing PA pressure.  3. Left atrial size was mildly  dilated.  4. The pericardial effusion is posterior to the left ventricle.  5. The mitral valve is normal in structure. Trivial mitral valve regurgitation.  6. The aortic valve is tricuspid. Aortic valve regurgitation is not visualized. No aortic stenosis is present.  7. The inferior vena cava is normal in size with greater than 50% respiratory variability, suggesting right atrial pressure of 3 mmHg. FINDINGS  Left Ventricle: There is no left ventricular thrombus seen with Definity contrast. There is severe hypokinesis of the apical 2/3 of the left ventrcile with preserved basal contractility. 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  Left ventricular ejection fraction, by estimation, is 30 to 35%. The left ventricle has moderately decreased function. The left ventricle demonstrates regional wall motion abnormalities. The left ventricular internal cavity size was normal in size. There is no left ventricular hypertrophy. Indeterminate diastolic filling due to E-A fusion. Right Ventricle: The right ventricular size is normal. No increase in right ventricular wall thickness. Right ventricular systolic function is normal. Tricuspid regurgitation signal is inadequate for assessing PA pressure. Left Atrium: Left atrial size was mildly dilated. Right Atrium: Right atrial size was normal in size. Pericardium: Trivial pericardial effusion is present. The pericardial effusion is posterior to the left ventricle. Mitral Valve: The mitral valve is normal in structure. Trivial mitral valve regurgitation. Tricuspid Valve: The tricuspid valve is normal in structure. Tricuspid valve regurgitation is not demonstrated. Aortic Valve: The aortic valve is tricuspid. Aortic valve regurgitation is not visualized. No aortic stenosis is present. Aortic valve mean gradient measures 3.0 mmHg. Aortic valve peak gradient measures 6.0 mmHg. Aortic valve area, by VTI measures 2.71 cm. Pulmonic Valve: The pulmonic valve  was normal in structure. Pulmonic valve regurgitation is not visualized. Aorta: The aortic root and ascending aorta are structurally normal, with no evidence of dilitation. Venous: The inferior vena cava is normal in size with greater than 50% respiratory variability, suggesting right atrial pressure of 3 mmHg. IAS/Shunts: No atrial level shunt detected by color flow Doppler.  LEFT VENTRICLE PLAX 2D LVIDd:         5.10 cm      Diastology LVIDs:         3.50 cm      LV e' lateral:   15.90 cm/s LV PW:         1.10 cm      LV E/e' lateral: 5.9 LV IVS:  1.00 cm LVOT diam:     2.00 cm LV SV:         48 LV SV Index:   31 LVOT Area:     3.14 cm  LV Volumes (MOD) LV vol d, MOD A4C: 136.7 ml LV vol s, MOD A4C: 98.2 ml LV SV MOD A4C:     45.5 ml RIGHT VENTRICLE             IVC RV Basal diam:  3.30 cm     IVC diam: 1.00 cm RV S prime:     22.40 cm/s TAPSE (M-mode): 2.6 cm LEFT ATRIUM             Index       RIGHT ATRIUM          Index LA diam:        4.00 cm 2.60 cm/m  RA Area:     9.32 cm LA Vol (A2C):   62.8 ml 40.87 ml/m RA Volume:   18.80 ml 12.24 ml/m LA Vol (A4C):   83.1 ml 54.08 ml/m LA Biplane Vol: 76.1 ml 49.53 ml/m  AORTIC VALVE AV Area (Vmax):    2.63 cm AV Area (Vmean):   2.61 cm AV Area (VTI):     2.71 cm AV Vmax:           122.00 cm/s AV Vmean:          85.500 cm/s AV VTI:            0.176 m AV Peak Grad:      6.0 mmHg AV Mean Grad:      3.0 mmHg LVOT Vmax:         102.00 cm/s LVOT Vmean:        71.100 cm/s LVOT VTI:          0.152 m LVOT/AV VTI ratio: 0.86  AORTA Ao Root diam: 2.70 cm Ao Asc diam:  2.50 cm MITRAL VALVE MV Area (PHT): 3.97 cm    SHUNTS MV Decel Time: 191 msec    Systemic VTI:  0.15 m MV E velocity: 94.10 cm/s  Systemic Diam: 2.00 cm Mihai Croitoru MD Electronically signed by Thurmon Fair MD Signature Date/Time: 02/06/2021/9:53:39 AM    Final       Medications:     Scheduled Medications:  aspirin EC  81 mg Oral Daily   famotidine  20 mg Oral Q12H   feeding supplement  1  Container Oral TID BM   methadone  130 mg Oral Daily   metoCLOPramide  5 mg Oral Q6H   Or   metoCLOPramide (REGLAN) injection  5 mg Intravenous Q6H   metoprolol succinate  25 mg Oral BID   ondansetron  4 mg Oral Q8H   Or   ondansetron (ZOFRAN) IV  4 mg Intravenous Q8H   potassium chloride  40 mEq Oral BID   prenatal multivitamin  1 tablet Oral Q1200   pyridOXINE  100 mg Intravenous Daily   scopolamine  1 patch Transdermal Q72H   sertraline  75 mg Oral Daily    Infusions:  famotidine (PEPCID) IV 100 mL/hr at 02/08/21 0700    PRN Medications: acetaminophen, ALPRAZolam, hydrOXYzine   Assessment/Plan:   1. Acute Systolic HF - ECHO EF 30-35% , severe HK of apical 2/3 left ventricle -->possible takotusubo CM - Stat limited ECHO to assess for some recovery.  - Continue Toprol to 25 bid - No ACE/ARB/ARNI/spiro or dig with pregnancy - Continue to monitor on  tele  2. QT prolongation - more prominent on ECG today. 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 Should improve soon  - Continue lower dose of sertraline. QTc interval improved.  - Discussed with pharmD   3. Opiate use - UDS + opiates  - On methadone will need to continue.    4. Anxiety    5. Pregnancy 13 wk 6/7   Check limited ECHO. Should be able to go home today.  Length of Stay: 4   Amy Clegg NP-C  02/08/2021, 7:44 AM  Advanced Heart Failure Team Pager [phone] (M-F; 7a - 4p)  Please contact [facility] for night-coverage after hours (4p -7a ) and weekends on [email or web address]  Personally reviewed  She feels better. No CP or SOB. HR improved. Wants to go home.   QT is beginning to shorten but still  General:  thin appearing. No resp difficulty HEENT: normal Neck: supple. no JVD. Carotids 2+ bilat; no bruits. No lymphadenopathy or thryomegaly appreciated. Cor: PMI nondisplaced. Regular rate & rhythm. No rubs, gallops or murmurs. Lungs: clear Abdomen: soft, nontender, nondistended. No  hepatosplenomegaly. No bruits or masses. Good bowel sounds. Extremities: no cyanosis, clubbing, rash, edema Neuro: alert & orientedx3, cranial nerves grossly intact. moves all 4 extremities w/o difficulty. Affect pleasant  Repeat echo shows improving EF 35-40%. QT improving. Still with TWI on ECG.  Ok to go home today if K >= 3.7  D/c cardiac meds  Toprol 25 bid ECASA 81 daily Kdur 20 daily.   We will see next week in clinic.   D/w Dr. Ashley Royalty.   Arvilla Meres, MD  8:40 AM

## 2021-02-08 NOTE — Plan of Care (Signed)
  Problem: Education: Goal: Knowledge of General Education information will improve Description: Including pain rating scale, medication(s)/side effects and non-pharmacologic comfort measures Outcome: Progressing   Problem: Health Behavior/Discharge Planning: Goal: Ability to manage health-related needs will improve Outcome: Progressing   Problem: Clinical Measurements: Goal: Ability to maintain clinical measurements within normal limits will improve Outcome: Progressing Goal: Will remain free from infection Outcome: Progressing Goal: Diagnostic test results will improve Outcome: Progressing Goal: Respiratory complications will improve Outcome: Progressing Goal: Cardiovascular complication will be avoided Outcome: Progressing   Problem: Activity: Goal: Risk for activity intolerance will decrease Outcome: Progressing   Problem: Nutrition: Goal: Adequate nutrition will be maintained Outcome: Progressing   Problem: Coping: Goal: Level of anxiety will decrease Outcome: Progressing   Problem: Elimination: Goal: Will not experience complications related to bowel motility Outcome: Progressing Goal: Will not experience complications related to urinary retention Outcome: Progressing   Problem: Pain Managment: Goal: General experience of comfort will improve Outcome: Progressing   Problem: Safety: Goal: Ability to remain free from injury will improve Outcome: Progressing   Problem: Skin Integrity: Goal: Risk for impaired skin integrity will decrease Outcome: Progressing   Problem: Education: Goal: Knowledge of disease or condition will improve Outcome: Progressing Goal: Knowledge of the prescribed therapeutic regimen will improve Outcome: Progressing Goal: Individualized Educational Video(s) Outcome: Progressing   Problem: Clinical Measurements: Goal: Complications related to the disease process, condition or treatment will be avoided or minimized Outcome:  Progressing   Problem: Education: Goal: Knowledge of disease or condition will improve Outcome: Progressing Goal: Knowledge of the prescribed therapeutic regimen will improve Outcome: Progressing   Problem: Bowel/Gastric: Goal: Occurences of nausea and/or vomiting will decrease Outcome: Progressing   Problem: Fluid Volume: Goal: Maintenance of adequate hydration will improve Outcome: Progressing   Problem: Nutritional: Goal: Achievement of adequate weight for body size and type will improve Outcome: Progressing   

## 2021-02-16 ENCOUNTER — Encounter: Payer: Medicaid Other | Admitting: Physician Assistant

## 2021-02-16 ENCOUNTER — Telehealth: Payer: Medicaid Other | Admitting: Physician Assistant

## 2021-02-16 ENCOUNTER — Telehealth: Payer: Medicaid Other | Admitting: Emergency Medicine

## 2021-02-16 DIAGNOSIS — O210 Mild hyperemesis gravidarum: Secondary | ICD-10-CM

## 2021-02-16 NOTE — Progress Notes (Signed)
Erroneous encounter -- patient had another e-visit for same complaint. Is pregnant with hyperemesis gravidarum with recent ER visit. She has been instructed to follow-up with her OB/GYN for further management and refills of medications.

## 2021-02-16 NOTE — Progress Notes (Signed)
For the safety of you and your child, I recommend a face to face office visit with a health care provider.  Many mothers need to take medicines during their pregnancy and while nursing.  Almost all medicines pass into the breast milk in small quantities.  Most are generally considered safe for a mother to take but some medicines must be avoided.  After reviewing your E-Visit request, I recommend that you consult your OB/GYN or pediatrician for medical advice in relation to your condition and prescription medications while pregnant or breastfeeding.  You need to contact your OBGYN for further refills of medication and for management of hyperemesis.   NOTE: If you entered your credit card information for this eVisit, you will not be charged. You may see a "hold" on your card for the $35 but that hold will drop off and you will not have a charge processed.  If you are having a true medical emergency please call 911.    For an urgent face to face visit, [facility] has six urgent care centers for your convenience:     [facility] at [facility] Directions [phone] [address] [place], [place] [address] 8 am - 4 pm Monday - Friday    [facility] Urgent [facility] Center [facility]) Get Driving Directions [phone] [address] [place], [place] [address] 8 am to 8 pm Monday-Friday 10 am to 6 pm [facility] Urgent Care Center [facility]) Get Driving Directions [phone]  [address] [place],  [place]  [address] 8 am to 8 pm Monday-Friday 8 am to 4 pm [facility] Urgent Care at [facility] Get Driving Directions [phone] [address] [place], [place] [address] 8 am to 8 pm Monday-Friday 8 am to 4 pm [facility] Urgent Care at [facility] Get Driving Directions  [phone] [address] [place], [place] [address] 8 am to 8 pm Monday-Friday 8 am to 4 pm [facility] Urgent Care at [facility] Directions [phone] [address] [place], [place] [address] 8 am to 8 pm Monday-Friday 8 am to 4 pm Saturday-Sunday     Your MyChart E-visit questionnaire answers were reviewed by a board certified advanced clinical practitioner to complete your personal care plan based on your specific symptoms.  Thank you for using e-Visits.
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
## 2021-02-19 ENCOUNTER — Encounter (HOSPITAL_COMMUNITY): Payer: Self-pay

## 2021-02-19 ENCOUNTER — Other Ambulatory Visit: Payer: Self-pay

## 2021-02-19 ENCOUNTER — Other Ambulatory Visit (HOSPITAL_COMMUNITY): Payer: Self-pay | Admitting: Family Medicine

## 2021-02-19 ENCOUNTER — Telehealth: Payer: Self-pay

## 2021-02-19 ENCOUNTER — Ambulatory Visit (HOSPITAL_COMMUNITY)
Admission: RE | Admit: 2021-02-19 | Discharge: 2021-02-19 | Disposition: A | Discharge status: Home / Self Care | Payer: Medicaid Other | Source: Ambulatory Visit | Attending: Family Medicine | Admitting: Family Medicine

## 2021-02-19 VITALS — BP 88/50 | HR 65 | Wt 112.8 lb

## 2021-02-19 DIAGNOSIS — Z87891 Personal history of nicotine dependence: Secondary | ICD-10-CM | POA: Insufficient documentation

## 2021-02-19 DIAGNOSIS — R9431 Abnormal electrocardiogram [ECG] [EKG]: Secondary | ICD-10-CM | POA: Insufficient documentation

## 2021-02-19 DIAGNOSIS — F1190 Opioid use, unspecified, uncomplicated: Secondary | ICD-10-CM

## 2021-02-19 DIAGNOSIS — Z79899 Other long term (current) drug therapy: Secondary | ICD-10-CM | POA: Diagnosis not present

## 2021-02-19 DIAGNOSIS — I5181 Takotsubo syndrome: Secondary | ICD-10-CM | POA: Diagnosis not present

## 2021-02-19 DIAGNOSIS — F419 Anxiety disorder, unspecified: Secondary | ICD-10-CM | POA: Insufficient documentation

## 2021-02-19 DIAGNOSIS — F32A Depression, unspecified: Secondary | ICD-10-CM | POA: Diagnosis not present

## 2021-02-19 DIAGNOSIS — O99411 Diseases of the circulatory system complicating pregnancy, first trimester: Secondary | ICD-10-CM | POA: Diagnosis present

## 2021-02-19 DIAGNOSIS — I4510 Unspecified right bundle-branch block: Secondary | ICD-10-CM | POA: Insufficient documentation

## 2021-02-19 DIAGNOSIS — F1990 Other psychoactive substance use, unspecified, uncomplicated: Secondary | ICD-10-CM | POA: Diagnosis not present

## 2021-02-19 DIAGNOSIS — O99412 Diseases of the circulatory system complicating pregnancy, second trimester: Secondary | ICD-10-CM | POA: Diagnosis not present

## 2021-02-19 DIAGNOSIS — O99342 Other mental disorders complicating pregnancy, second trimester: Secondary | ICD-10-CM | POA: Insufficient documentation

## 2021-02-19 DIAGNOSIS — I5022 Chronic systolic (congestive) heart failure: Secondary | ICD-10-CM | POA: Insufficient documentation

## 2021-02-19 DIAGNOSIS — O99322 Drug use complicating pregnancy, second trimester: Secondary | ICD-10-CM | POA: Insufficient documentation

## 2021-02-19 DIAGNOSIS — Z7982 Long term (current) use of aspirin: Secondary | ICD-10-CM | POA: Insufficient documentation

## 2021-02-19 DIAGNOSIS — Z3A15 15 weeks gestation of pregnancy: Secondary | ICD-10-CM | POA: Insufficient documentation

## 2021-02-19 DIAGNOSIS — I498 Other specified cardiac arrhythmias: Secondary | ICD-10-CM | POA: Insufficient documentation

## 2021-02-19 LAB — BASIC METABOLIC PANEL
Anion gap: 5 (ref 5–15)
BUN: 6 mg/dL (ref 6–20)
CO2: 25 mmol/L (ref 22–32)
Calcium: 8.8 mg/dL — ABNORMAL LOW (ref 8.9–10.3)
Chloride: 102 mmol/L (ref 98–111)
Creatinine, Ser: 0.47 mg/dL (ref 0.44–1.00)
GFR, Estimated: >60 mL/min (ref >60)
Glucose, Bld: 80 mg/dL (ref 70–99)
Potassium: 4 mmol/L (ref 3.5–5.1)
Sodium: 132 mmol/L — ABNORMAL LOW (ref 135–145)

## 2021-02-19 LAB — MAGNESIUM: Magnesium: 2 mg/dL (ref 1.7–2.4)

## 2021-02-19 MED ORDER — SERTRALINE HCL 25 MG PO TABS: 50.0000 mg | ORAL_TABLET | Freq: Every day | ORAL | 3 refills | Status: DC

## 2021-02-19 MED ORDER — METOPROLOL SUCCINATE ER 25 MG PO TB24: 12.5000 mg | ORAL_TABLET | Freq: Two times a day (BID) | ORAL | 3 refills | Status: DC

## 2021-02-19 MED ORDER — ONDANSETRON 4 MG PO TBDP: 4.0000 mg | ORAL_TABLET | Freq: Three times a day (TID) | ORAL | 0 refills | Status: DC

## 2021-02-19 NOTE — Telephone Encounter (Signed)
Pt called requesting a refill on her Zofran.  Called pt and informed her that the medication that she requested has been sent her [facility].  Pt stated thank you with no further questions.

## 2021-02-19 NOTE — Progress Notes (Signed)
No show

## 2021-02-19 NOTE — Patient Instructions (Addendum)
EKG done today.  Labs done today. We will contact you only if your labs are abnormal.  DECREASE Toprol XL to 12.5mg (1/2 tablet) by mouth 2 times daily.  DECREASE Zoloft to 50mg (2 tablets) by mouth daily.   No other medication changes were made. Please continue all current medications as prescribed.  Your physician recommends that you schedule a follow-up appointment in: 2 weeks for a EKG and in 4-6 weeks with our Pharmacist here in our office.  If you have any questions or concerns before your next appointment please send a message through [place] or call our office at [phone].    TO LEAVE A MESSAGE FOR THE NURSE SELECT OPTION 2, PLEASE LEAVE A MESSAGE INCLUDING: YOUR NAME DATE OF BIRTH CALL BACK NUMBER REASON FOR CALL**this is important as we prioritize the call backs  YOU WILL RECEIVE A CALL BACK THE SAME DAY AS LONG AS YOU CALL BEFORE 4:00 PM   Do the following things EVERYDAY: Weigh yourself in the morning before breakfast. Write it down and keep it in a log. Take your medicines as prescribed Eat low salt foods--Limit salt (sodium) to 2000 mg per day.  Stay as active as you can everyday Limit all fluids for the day to less than 2 liters   At the [facility], you and your health needs are our priority. As part of our continuing mission to provide you with exceptional heart care, we have created designated Provider Care Teams. These Care Teams include your primary Cardiologist (physician) and Advanced Practice Providers (APPs- Physician Assistants and Nurse Practitioners) who all work together to provide you with the care you need, when you need it.   You may see any of the following providers on your designated Care Team at your next follow up: Dr [phone] Dr Arvilla Meres, NP Carron Curie, Robbie Lis [place], PharmD   Please be sure to bring in all your medications bottles to every appointment.

## 2021-02-21 ENCOUNTER — Telehealth: Payer: Self-pay

## 2021-02-21 NOTE — Telephone Encounter (Signed)
Tried calling patient. Mailbox is full, unable to leave a message. Trying to schedule patient for July 8th.

## 2021-02-28 ENCOUNTER — Encounter (HOSPITAL_COMMUNITY): Payer: Medicaid Other

## 2021-03-01 ENCOUNTER — Encounter: Payer: Medicaid Other | Admitting: Obstetrics and Gynecology

## 2021-03-07 ENCOUNTER — Encounter (HOSPITAL_COMMUNITY): Payer: Self-pay | Admitting: Obstetrics and Gynecology

## 2021-03-07 ENCOUNTER — Inpatient Hospital Stay (HOSPITAL_COMMUNITY)
Admission: AD | Admit: 2021-03-07 | Discharge: 2021-03-08 | Disposition: A | Discharge status: Home / Self Care | Payer: Medicaid Other | Attending: Obstetrics and Gynecology | Admitting: Obstetrics and Gynecology

## 2021-03-07 DIAGNOSIS — O219 Vomiting of pregnancy, unspecified: Secondary | ICD-10-CM | POA: Diagnosis not present

## 2021-03-07 DIAGNOSIS — F419 Anxiety disorder, unspecified: Secondary | ICD-10-CM | POA: Insufficient documentation

## 2021-03-07 DIAGNOSIS — Z7982 Long term (current) use of aspirin: Secondary | ICD-10-CM | POA: Diagnosis not present

## 2021-03-07 DIAGNOSIS — F1123 Opioid dependence with withdrawal: Secondary | ICD-10-CM | POA: Diagnosis not present

## 2021-03-07 DIAGNOSIS — Z20822 Contact with and (suspected) exposure to covid-19: Secondary | ICD-10-CM | POA: Diagnosis not present

## 2021-03-07 DIAGNOSIS — I4519 Other right bundle-branch block: Secondary | ICD-10-CM | POA: Diagnosis not present

## 2021-03-07 DIAGNOSIS — F32A Depression, unspecified: Secondary | ICD-10-CM | POA: Insufficient documentation

## 2021-03-07 DIAGNOSIS — Z79899 Other long term (current) drug therapy: Secondary | ICD-10-CM | POA: Insufficient documentation

## 2021-03-07 DIAGNOSIS — F1290 Cannabis use, unspecified, uncomplicated: Secondary | ICD-10-CM | POA: Diagnosis not present

## 2021-03-07 DIAGNOSIS — Z87891 Personal history of nicotine dependence: Secondary | ICD-10-CM | POA: Insufficient documentation

## 2021-03-07 DIAGNOSIS — J45909 Unspecified asthma, uncomplicated: Secondary | ICD-10-CM | POA: Insufficient documentation

## 2021-03-07 DIAGNOSIS — O99412 Diseases of the circulatory system complicating pregnancy, second trimester: Secondary | ICD-10-CM | POA: Diagnosis not present

## 2021-03-07 DIAGNOSIS — Z3A17 17 weeks gestation of pregnancy: Secondary | ICD-10-CM | POA: Diagnosis not present

## 2021-03-07 DIAGNOSIS — O99322 Drug use complicating pregnancy, second trimester: Secondary | ICD-10-CM | POA: Insufficient documentation

## 2021-03-07 DIAGNOSIS — F1193 Opioid use, unspecified with withdrawal: Secondary | ICD-10-CM

## 2021-03-07 DIAGNOSIS — O99342 Other mental disorders complicating pregnancy, second trimester: Secondary | ICD-10-CM | POA: Diagnosis not present

## 2021-03-07 DIAGNOSIS — O99512 Diseases of the respiratory system complicating pregnancy, second trimester: Secondary | ICD-10-CM | POA: Insufficient documentation

## 2021-03-07 LAB — COMPREHENSIVE METABOLIC PANEL
ALT: 10 U/L (ref 0–44)
AST: 15 U/L (ref 15–41)
Albumin: 3.3 g/dL — ABNORMAL LOW (ref 3.5–5.0)
Alkaline Phosphatase: 65 U/L (ref 38–126)
Anion gap: 18 — ABNORMAL HIGH (ref 5–15)
BUN: <5 mg/dL — ABNORMAL LOW (ref 6–20)
CO2: 12 mmol/L — ABNORMAL LOW (ref 22–32)
Calcium: 9.1 mg/dL (ref 8.9–10.3)
Chloride: 105 mmol/L (ref 98–111)
Creatinine, Ser: 0.67 mg/dL (ref 0.44–1.00)
GFR, Estimated: >60 mL/min (ref >60)
Glucose, Bld: 84 mg/dL (ref 70–99)
Potassium: 3 mmol/L — ABNORMAL LOW (ref 3.5–5.1)
Sodium: 135 mmol/L (ref 135–145)
Total Bilirubin: 1.2 mg/dL (ref 0.3–1.2)
Total Protein: 6.8 g/dL (ref 6.5–8.1)

## 2021-03-07 LAB — LIPASE, BLOOD: Lipase: 26 U/L (ref 11–51)

## 2021-03-07 LAB — RAPID URINE DRUG SCREEN, HOSP PERFORMED
Amphetamines: NOT DETECTED
Barbiturates: NOT DETECTED
Benzodiazepines: POSITIVE — AB
Cocaine: NOT DETECTED
Opiates: NOT DETECTED
Tetrahydrocannabinol: POSITIVE — AB

## 2021-03-07 LAB — RESP PANEL BY RT-PCR (FLU A&B, COVID) ARPGX2
Influenza A by PCR: NEGATIVE
Influenza B by PCR: NEGATIVE
SARS Coronavirus 2 by RT PCR: NEGATIVE

## 2021-03-07 LAB — URINALYSIS, COMPLETE (UACMP) WITH MICROSCOPIC
Bilirubin Urine: NEGATIVE
Glucose, UA: NEGATIVE mg/dL
Hgb urine dipstick: NEGATIVE
Ketones, ur: 80 mg/dL — AB
Nitrite: NEGATIVE
Protein, ur: 30 mg/dL — AB
Specific Gravity, Urine: 1.019 (ref 1.005–1.030)
pH: 5 (ref 5.0–8.0)

## 2021-03-07 LAB — TROPONIN I (HIGH SENSITIVITY)
Troponin I (High Sensitivity): 8 ng/L (ref <18)
Troponin I (High Sensitivity): 7 ng/L (ref <18)

## 2021-03-07 LAB — CBC
HCT: 29.3 % — ABNORMAL LOW (ref 36.0–46.0)
Hemoglobin: 9.4 g/dL — ABNORMAL LOW (ref 12.0–15.0)
MCH: 25.1 pg — ABNORMAL LOW (ref 26.0–34.0)
MCHC: 32.1 g/dL (ref 30.0–36.0)
MCV: 78.3 fL — ABNORMAL LOW (ref 80.0–100.0)
Platelets: 242 10*3/uL (ref 150–400)
RBC: 3.74 MIL/uL — ABNORMAL LOW (ref 3.87–5.11)
RDW: 17.3 % — ABNORMAL HIGH (ref 11.5–15.5)
WBC: 10.3 10*3/uL (ref 4.0–10.5)
nRBC: 0 % (ref 0.0–0.2)

## 2021-03-07 LAB — TYPE AND SCREEN
ABO/RH(D): A POS
Antibody Screen: NEGATIVE

## 2021-03-07 LAB — SEDIMENTATION RATE: Sed Rate: 45 mm/hr — ABNORMAL HIGH (ref 0–22)

## 2021-03-07 LAB — BRAIN NATRIURETIC PEPTIDE: B Natriuretic Peptide: 133.8 pg/mL — ABNORMAL HIGH (ref 0.0–100.0)

## 2021-03-07 LAB — LACTIC ACID, PLASMA: Lactic Acid, Venous: 1.3 mmol/L (ref 0.5–1.9)

## 2021-03-07 LAB — MAGNESIUM: Magnesium: 2 mg/dL (ref 1.7–2.4)

## 2021-03-07 MED ORDER — ALPRAZOLAM 1 MG PO TABS: 1.0000 mg | ORAL_TABLET | Freq: Two times a day (BID) | ORAL | 0 refills | Status: DC | PRN

## 2021-03-07 MED ORDER — ALPRAZOLAM 0.5 MG PO TABS
1.0000 mg | ORAL_TABLET | Freq: Once | ORAL | Status: AC
  Administered 2021-03-07: 1 mg via ORAL
  Filled 2021-03-07: qty 1

## 2021-03-07 MED ORDER — LORAZEPAM 2 MG/ML IJ SOLN
1.0000 mg | Freq: Once | INTRAMUSCULAR | Status: AC
  Administered 2021-03-07: 1 mg via INTRAVENOUS
  Filled 2021-03-07: qty 1

## 2021-03-07 MED ORDER — LACTATED RINGERS IV SOLN: INTRAVENOUS | Status: DC

## 2021-03-07 MED ORDER — METHADONE HCL 10 MG/ML IJ SOLN
65.0000 mg | Freq: Once | INTRAMUSCULAR | Status: AC
  Administered 2021-03-07: 65 mg via INTRAVENOUS
  Filled 2021-03-07: qty 6.5

## 2021-03-07 MED ORDER — ONDANSETRON 4 MG PO TBDP
8.0000 mg | ORAL_TABLET | Freq: Once | ORAL | Status: AC
  Administered 2021-03-07: 8 mg via ORAL
  Filled 2021-03-07: qty 2

## 2021-03-07 MED ORDER — DEXAMETHASONE SODIUM PHOSPHATE 10 MG/ML IJ SOLN
10.0000 mg | Freq: Once | INTRAMUSCULAR | Status: AC
  Administered 2021-03-07: 10 mg via INTRAVENOUS
  Filled 2021-03-07: qty 1

## 2021-03-07 MED ORDER — ONDANSETRON 4 MG PO TBDP: 4.0000 mg | ORAL_TABLET | Freq: Once | ORAL | Status: DC

## 2021-03-07 MED ORDER — SCOPOLAMINE 1 MG/3DAYS TD PT72
1.0000 | MEDICATED_PATCH | Freq: Once | TRANSDERMAL | Status: DC
  Administered 2021-03-07: 1.5 mg via TRANSDERMAL
  Filled 2021-03-07: qty 1

## 2021-03-07 MED ORDER — METOPROLOL SUCCINATE 12.5 MG HALF TABLET
12.5000 mg | ORAL_TABLET | Freq: Every day | ORAL | Status: DC
  Administered 2021-03-07: 12.5 mg via ORAL
  Filled 2021-03-07: qty 1

## 2021-03-07 MED ORDER — METOCLOPRAMIDE HCL 5 MG/ML IJ SOLN
10.0000 mg | Freq: Once | INTRAMUSCULAR | Status: AC
  Administered 2021-03-07: 10 mg via INTRAVENOUS
  Filled 2021-03-07: qty 2

## 2021-03-07 NOTE — MAU Note (Signed)
EMS arrival. "Sick". Started this morning. Pain  in upper stomach. Takes Methadone, did not get dose today.

## 2021-03-07 NOTE — MAU Note (Signed)
1630 Met patient in room - was crouched on bed with head in trash can vomiting.  Gave her emesis bag and introduced self. Pt stated she needed to use restroom - escorted to bathroom, where she stayed for a while as she stated she needed to have a BM.  Pt taken back to bed, continued vomiting.  Was unable to tolerate being in position for EKG/Doppler of fetus. Was able to place IV. Phlebotomist  came and drew blood, but had trouble as veins collapsed. Phlebotomy finally got ordered labs.   Pt received IV Ativan,which calmed her down to allow for procedures to be done.  She received Reglan, then Methadone.   Pt was vomiting when RN came in to give ODT Zofran, but said she felt better.   Has intermittent tachycardia, provider aware.  Pt states she feels better after medications.

## 2021-03-07 NOTE — MAU Note (Signed)
Pt on floor, under wc, dry heaving.  Asked pt to get off the floor and back in chair, at first said she couldn't - when I said I am taking you to a rm, pt immediately got up and into chair.  Nose running, kleenex given, - had to tell pt to wipe her nose.

## 2021-03-07 NOTE — MAU Note (Signed)
Pt stated she vomited 5 minutes after taking her metoprolol. She did take Ativan before this and may have kept it down, but unsure about either. Provider notified.   Per provider, pt is stable to be discharged. Taxi voucher obtained from Social work.  When going in to discharge patient, she asked for something else for nausea. Provider notified and ordered additional medication.
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
## 2021-03-08 ENCOUNTER — Inpatient Hospital Stay (EMERGENCY_DEPARTMENT_HOSPITAL)
Admission: EM | Admit: 2021-03-08 | Discharge: 2021-03-09 | Disposition: A | Discharge status: Home / Self Care | Payer: Medicaid Other | Source: Home / Self Care | Attending: Emergency Medicine | Admitting: Emergency Medicine

## 2021-03-08 DIAGNOSIS — O99322 Drug use complicating pregnancy, second trimester: Secondary | ICD-10-CM | POA: Insufficient documentation

## 2021-03-08 DIAGNOSIS — Z79899 Other long term (current) drug therapy: Secondary | ICD-10-CM | POA: Insufficient documentation

## 2021-03-08 DIAGNOSIS — R9431 Abnormal electrocardiogram [ECG] [EKG]: Secondary | ICD-10-CM

## 2021-03-08 DIAGNOSIS — I4581 Long QT syndrome: Secondary | ICD-10-CM | POA: Insufficient documentation

## 2021-03-08 DIAGNOSIS — Z3A17 17 weeks gestation of pregnancy: Secondary | ICD-10-CM | POA: Insufficient documentation

## 2021-03-08 DIAGNOSIS — O26852 Spotting complicating pregnancy, second trimester: Secondary | ICD-10-CM | POA: Insufficient documentation

## 2021-03-08 DIAGNOSIS — O211 Hyperemesis gravidarum with metabolic disturbance: Secondary | ICD-10-CM | POA: Insufficient documentation

## 2021-03-08 DIAGNOSIS — F1123 Opioid dependence with withdrawal: Secondary | ICD-10-CM

## 2021-03-08 DIAGNOSIS — Z87891 Personal history of nicotine dependence: Secondary | ICD-10-CM | POA: Insufficient documentation

## 2021-03-08 DIAGNOSIS — O99412 Diseases of the circulatory system complicating pregnancy, second trimester: Secondary | ICD-10-CM | POA: Insufficient documentation

## 2021-03-08 DIAGNOSIS — J45909 Unspecified asthma, uncomplicated: Secondary | ICD-10-CM | POA: Insufficient documentation

## 2021-03-08 DIAGNOSIS — I5021 Acute systolic (congestive) heart failure: Secondary | ICD-10-CM | POA: Insufficient documentation

## 2021-03-08 DIAGNOSIS — I5181 Takotsubo syndrome: Secondary | ICD-10-CM

## 2021-03-08 DIAGNOSIS — O210 Mild hyperemesis gravidarum: Secondary | ICD-10-CM

## 2021-03-08 DIAGNOSIS — F1120 Opioid dependence, uncomplicated: Secondary | ICD-10-CM | POA: Insufficient documentation

## 2021-03-08 DIAGNOSIS — F1193 Opioid use, unspecified with withdrawal: Secondary | ICD-10-CM

## 2021-03-08 DIAGNOSIS — O99512 Diseases of the respiratory system complicating pregnancy, second trimester: Secondary | ICD-10-CM | POA: Insufficient documentation

## 2021-03-08 DIAGNOSIS — Z7982 Long term (current) use of aspirin: Secondary | ICD-10-CM | POA: Insufficient documentation

## 2021-03-08 LAB — CBC WITH DIFFERENTIAL/PLATELET
Abs Immature Granulocytes: 0.08 10*3/uL — ABNORMAL HIGH (ref 0.00–0.07)
Basophils Absolute: 0 10*3/uL (ref 0.0–0.1)
Basophils Relative: 0 %
Eosinophils Absolute: 0 10*3/uL (ref 0.0–0.5)
Eosinophils Relative: 0 %
HCT: 31.2 % — ABNORMAL LOW (ref 36.0–46.0)
Hemoglobin: 9.6 g/dL — ABNORMAL LOW (ref 12.0–15.0)
Immature Granulocytes: 1 %
Lymphocytes Relative: 11 %
Lymphs Abs: 1.5 10*3/uL (ref 0.7–4.0)
MCH: 25.3 pg — ABNORMAL LOW (ref 26.0–34.0)
MCHC: 30.8 g/dL (ref 30.0–36.0)
MCV: 82.3 fL (ref 80.0–100.0)
Monocytes Absolute: 0.7 10*3/uL (ref 0.1–1.0)
Monocytes Relative: 5 %
Neutro Abs: 11.7 10*3/uL — ABNORMAL HIGH (ref 1.7–7.7)
Neutrophils Relative %: 83 %
Platelets: 308 10*3/uL (ref 150–400)
RBC: 3.79 MIL/uL — ABNORMAL LOW (ref 3.87–5.11)
RDW: 18.2 % — ABNORMAL HIGH (ref 11.5–15.5)
WBC: 14 10*3/uL — ABNORMAL HIGH (ref 4.0–10.5)
nRBC: 0 % (ref 0.0–0.2)

## 2021-03-08 LAB — COMPREHENSIVE METABOLIC PANEL
ALT: 10 U/L (ref 0–44)
AST: 19 U/L (ref 15–41)
Albumin: 3.5 g/dL (ref 3.5–5.0)
Alkaline Phosphatase: 68 U/L (ref 38–126)
Anion gap: 15 (ref 5–15)
BUN: 6 mg/dL (ref 6–20)
CO2: 13 mmol/L — ABNORMAL LOW (ref 22–32)
Calcium: 9 mg/dL (ref 8.9–10.3)
Chloride: 110 mmol/L (ref 98–111)
Creatinine, Ser: 0.68 mg/dL (ref 0.44–1.00)
GFR, Estimated: >60 mL/min (ref >60)
Glucose, Bld: 86 mg/dL (ref 70–99)
Potassium: 3.1 mmol/L — ABNORMAL LOW (ref 3.5–5.1)
Sodium: 138 mmol/L (ref 135–145)
Total Bilirubin: 1.1 mg/dL (ref 0.3–1.2)
Total Protein: 7.2 g/dL (ref 6.5–8.1)

## 2021-03-08 LAB — BASIC METABOLIC PANEL
Anion gap: 15 (ref 5–15)
BUN: 8 mg/dL (ref 6–20)
CO2: 12 mmol/L — ABNORMAL LOW (ref 22–32)
Calcium: 9 mg/dL (ref 8.9–10.3)
Chloride: 108 mmol/L (ref 98–111)
Creatinine, Ser: 0.72 mg/dL (ref 0.44–1.00)
GFR, Estimated: >60 mL/min (ref >60)
Glucose, Bld: 86 mg/dL (ref 70–99)
Potassium: 3 mmol/L — ABNORMAL LOW (ref 3.5–5.1)
Sodium: 135 mmol/L (ref 135–145)

## 2021-03-08 LAB — WET PREP, GENITAL
Clue Cells Wet Prep HPF POC: NONE SEEN
Sperm: NONE SEEN
Trich, Wet Prep: NONE SEEN

## 2021-03-08 LAB — LACTIC ACID, PLASMA: Lactic Acid, Venous: 1.3 mmol/L (ref 0.5–1.9)

## 2021-03-08 LAB — TROPONIN I (HIGH SENSITIVITY)
Troponin I (High Sensitivity): 11 ng/L (ref <18)
Troponin I (High Sensitivity): 9 ng/L (ref <18)
Troponin I (High Sensitivity): 11 ng/L (ref <18)

## 2021-03-08 LAB — LIPASE, BLOOD: Lipase: 23 U/L (ref 11–51)

## 2021-03-08 LAB — BRAIN NATRIURETIC PEPTIDE: B Natriuretic Peptide: 280.2 pg/mL — ABNORMAL HIGH (ref 0.0–100.0)

## 2021-03-08 MED ORDER — SODIUM CHLORIDE 0.9 % IV SOLN: INTRAVENOUS | Status: DC | PRN

## 2021-03-08 MED ORDER — SODIUM CHLORIDE 0.9 % IV BOLUS
30.0000 mL/kg | Freq: Once | INTRAVENOUS | Status: AC
  Administered 2021-03-08: 1779 mL via INTRAVENOUS

## 2021-03-08 MED ORDER — FENTANYL CITRATE (PF) 100 MCG/2ML IJ SOLN
100.0000 ug | Freq: Once | INTRAMUSCULAR | Status: AC
Start: 2021-03-08 — End: 2021-03-08
  Administered 2021-03-08: 100 ug via INTRAVENOUS
  Filled 2021-03-08: qty 2

## 2021-03-08 MED ORDER — POTASSIUM CHLORIDE 10 MEQ/100ML IV SOLN
10.0000 meq | INTRAVENOUS | Status: AC
  Administered 2021-03-08 (×3): 10 meq via INTRAVENOUS
  Filled 2021-03-08 (×3): qty 100

## 2021-03-08 MED ORDER — FENTANYL CITRATE (PF) 100 MCG/2ML IJ SOLN
100.0000 ug | Freq: Once | INTRAMUSCULAR | Status: AC
  Administered 2021-03-08: 100 ug via INTRAVENOUS
  Filled 2021-03-08: qty 2

## 2021-03-08 MED ORDER — OXYCODONE-ACETAMINOPHEN 5-325 MG PO TABS
1.0000 | ORAL_TABLET | Freq: Once | ORAL | Status: AC
Start: 2021-03-08 — End: 2021-03-09
  Administered 2021-03-09: 1 via ORAL
  Filled 2021-03-08 (×2): qty 1

## 2021-03-08 MED ORDER — METOCLOPRAMIDE HCL 5 MG/ML IJ SOLN
5.0000 mg | Freq: Once | INTRAMUSCULAR | Status: AC
  Administered 2021-03-08: 5 mg via INTRAVENOUS
  Filled 2021-03-08: qty 2

## 2021-03-08 MED ORDER — LORAZEPAM 2 MG/ML IJ SOLN
1.0000 mg | Freq: Four times a day (QID) | INTRAMUSCULAR | Status: DC | PRN
  Administered 2021-03-08: 1 mg via INTRAVENOUS
  Filled 2021-03-08: qty 1

## 2021-03-08 MED ORDER — DEXAMETHASONE SODIUM PHOSPHATE 10 MG/ML IJ SOLN
10.0000 mg | Freq: Once | INTRAMUSCULAR | Status: AC
  Administered 2021-03-08: 10 mg via INTRAVENOUS
  Filled 2021-03-08: qty 1

## 2021-03-08 MED ORDER — DIPHENHYDRAMINE HCL 50 MG/ML IJ SOLN
12.5000 mg | Freq: Once | INTRAMUSCULAR | Status: AC
  Administered 2021-03-08: 12.5 mg via INTRAVENOUS
  Filled 2021-03-08: qty 1

## 2021-03-08 NOTE — ED Notes (Signed)
The patient is brought back to the room via wheelchair. 2 NT's at the bedside attempting to assist the patient to get in the bed. The patient refuses to stand up. Redirected the patient that it is neccessary for care that she be in bed on the monitor, she agrees.

## 2021-03-08 NOTE — ED Triage Notes (Signed)
Patient BIB [facility] for generalized body aches, chest pain, and vomiting, was discharged from [facility] hospital (patient is pregnant) last night after admission for methadone withdrawal. PAtient received 4mg  zofran from EMS PTA.

## 2021-03-08 NOTE — MAU Note (Signed)
Patient presented to MAU via transfer from the ED. Patient c/o generalized pain with a pain rating of 9/10. Pt reported having some scant VB in the lobby. Pt stated feeling panicky.
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
## 2021-03-08 NOTE — ED Notes (Signed)
Fentanyl injection handed off to on coming nurse Johann Capers, RN

## 2021-03-08 NOTE — ED Provider Notes (Signed)
Emergency Medicine Provider Triage Evaluation Note  [name] , a 25 y.o. female  was evaluated in triage.  Pt complains of chest pain, vomiting and vaginal bleeding.  Chest pain vomiting began few days ago.  Vaginal bleeding started today.  Seen and evaluated yesterday at MAU with concern for methadone withdrawal.  Review of Systems  Positive: Chest pain, vomiting, vaginal bleeding Negative: Fever  Physical Exam  LMP  (LMP Unknown)  Gen:   Awake, no distress   Resp:  Normal effort  MSK:   Moves extremities without difficulty  Other:  Abdomen generally tender  Medical Decision Making  Medically screening exam initiated at 12:26 PM.  Appropriate orders placed.  Shaquanta Sortino was informed that the remainder of the evaluation will be completed by another provider, this initial triage assessment does not replace that evaluation, and the importance of remaining in the ED until their evaluation is complete.  Contacted MAU but they recommend ruling cardiac etiology of her chest pain and then can consult OB/GYN as needed for vaginal bleeding versus transfer to MAU then   Dietrich Pates, PA-C 03/08/21 1229    Mancel Bale, MD 03/10/21 1339

## 2021-03-09 DIAGNOSIS — O99322 Drug use complicating pregnancy, second trimester: Secondary | ICD-10-CM | POA: Diagnosis not present

## 2021-03-09 DIAGNOSIS — Z3A17 17 weeks gestation of pregnancy: Secondary | ICD-10-CM

## 2021-03-09 DIAGNOSIS — I5181 Takotsubo syndrome: Secondary | ICD-10-CM

## 2021-03-09 DIAGNOSIS — F1123 Opioid dependence with withdrawal: Secondary | ICD-10-CM

## 2021-03-09 DIAGNOSIS — O99412 Diseases of the circulatory system complicating pregnancy, second trimester: Secondary | ICD-10-CM | POA: Diagnosis not present

## 2021-03-09 DIAGNOSIS — O219 Vomiting of pregnancy, unspecified: Secondary | ICD-10-CM | POA: Diagnosis not present

## 2021-03-09 LAB — GC/CHLAMYDIA PROBE AMP (~~LOC~~) NOT AT ARMC
Chlamydia: NEGATIVE
Comment: NEGATIVE
Comment: NORMAL
Neisseria Gonorrhea: NEGATIVE

## 2021-03-11 ENCOUNTER — Other Ambulatory Visit (INDEPENDENT_AMBULATORY_CARE_PROVIDER_SITE_OTHER): Payer: Self-pay

## 2021-03-12 ENCOUNTER — Other Ambulatory Visit: Payer: Self-pay | Admitting: Obstetrics and Gynecology

## 2021-03-12 ENCOUNTER — Other Ambulatory Visit: Payer: Self-pay | Admitting: Student

## 2021-03-12 ENCOUNTER — Telehealth: Payer: Self-pay

## 2021-03-12 NOTE — Telephone Encounter (Signed)
Patient left voicemail on nurse line and requests call back. Pt needs medication refills.   Called pt; pt reports ongoing nausea and vomiting. Very concerned about not being able to get refills for psych medications. Reports not being able to start prenatal care due to hospital admission and childcare issues. Currently going to clinic daily for methodone administration. Pt to be overbooked with Shawnie Pons, MD for new OB tomorrow.

## 2021-03-13 ENCOUNTER — Other Ambulatory Visit: Payer: Self-pay

## 2021-03-13 ENCOUNTER — Ambulatory Visit (INDEPENDENT_AMBULATORY_CARE_PROVIDER_SITE_OTHER): Payer: Medicaid Other | Admitting: Family Medicine

## 2021-03-13 ENCOUNTER — Encounter: Payer: Self-pay | Admitting: Family Medicine

## 2021-03-13 VITALS — BP 93/67 | HR 73 | Wt 106.7 lb

## 2021-03-13 DIAGNOSIS — I5021 Acute systolic (congestive) heart failure: Secondary | ICD-10-CM

## 2021-03-13 DIAGNOSIS — O0990 Supervision of high risk pregnancy, unspecified, unspecified trimester: Secondary | ICD-10-CM | POA: Insufficient documentation

## 2021-03-13 DIAGNOSIS — R9431 Abnormal electrocardiogram [ECG] [EKG]: Secondary | ICD-10-CM

## 2021-03-13 DIAGNOSIS — O210 Mild hyperemesis gravidarum: Secondary | ICD-10-CM

## 2021-03-13 DIAGNOSIS — F1190 Opioid use, unspecified, uncomplicated: Secondary | ICD-10-CM

## 2021-03-13 DIAGNOSIS — F410 Panic disorder [episodic paroxysmal anxiety] without agoraphobia: Secondary | ICD-10-CM

## 2021-03-13 DIAGNOSIS — F418 Other specified anxiety disorders: Secondary | ICD-10-CM

## 2021-03-13 MED ORDER — SERTRALINE HCL 50 MG PO TABS: 50.0000 mg | ORAL_TABLET | Freq: Every day | ORAL | 3 refills | Status: DC

## 2021-03-13 MED ORDER — METOCLOPRAMIDE HCL 5 MG PO TABS: 5.0000 mg | ORAL_TABLET | Freq: Four times a day (QID) | ORAL | 3 refills | Status: DC

## 2021-03-13 MED ORDER — BLOOD PRESSURE KIT DEVI: 1.0000 | Freq: Once | 0 refills | Status: AC

## 2021-03-13 MED ORDER — GOJJI WEIGHT SCALE MISC: 1.0000 | Freq: Once | 0 refills | Status: AC

## 2021-03-13 MED ORDER — SCOPOLAMINE 1 MG/3DAYS TD PT72: 1.0000 | MEDICATED_PATCH | TRANSDERMAL | 1 refills | Status: DC

## 2021-03-13 MED ORDER — ONDANSETRON 4 MG PO TBDP: 4.0000 mg | ORAL_TABLET | Freq: Three times a day (TID) | ORAL | 3 refills | Status: DC

## 2021-03-13 MED ORDER — ALPRAZOLAM 1 MG PO TABS: 1.0000 mg | ORAL_TABLET | Freq: Two times a day (BID) | ORAL | 1 refills | Status: DC | PRN

## 2021-03-14 NOTE — Patient Instructions (Signed)
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
## 2021-03-15 LAB — AFP, SERUM, OPEN SPINA BIFIDA
AFP MoM: 1.02
AFP Value: 61.9 ng/mL
Gest. Age on Collection Date: 18.4 weeks
Maternal Age At EDD: 26.9 yr
OSBR Risk 1 IN: 10000
Test Results:: NEGATIVE
Weight: 107 [lb_av]

## 2021-03-15 LAB — HEPATITIS B SURFACE ANTIGEN: Hepatitis B Surface Ag: NEGATIVE

## 2021-03-15 LAB — RPR: RPR Ser Ql: NONREACTIVE

## 2021-03-15 LAB — RUBELLA ANTIBODY, IGM: Rubella IgM: <20 AU/mL (ref 0.0–19.9)

## 2021-03-15 LAB — HEPATITIS C ANTIBODY: Hep C Virus Ab: 0.2 s/co ratio (ref 0.0–0.9)

## 2021-03-19 ENCOUNTER — Telehealth (INDEPENDENT_AMBULATORY_CARE_PROVIDER_SITE_OTHER): Payer: Medicaid Other | Admitting: Family Medicine

## 2021-03-19 DIAGNOSIS — F1120 Opioid dependence, uncomplicated: Secondary | ICD-10-CM

## 2021-03-19 DIAGNOSIS — F410 Panic disorder [episodic paroxysmal anxiety] without agoraphobia: Secondary | ICD-10-CM | POA: Diagnosis not present

## 2021-03-19 NOTE — Telephone Encounter (Signed)
Attempted to call patient, went straight to voicemail and mailbox full.  Patient currently on 130 mg methadone at [facility]. Unfortunately Qtc also significantly prolonged at 564 on last check. In addition patient is suffering from hyperemesis gravidarum and has minimal options to treat her symptoms in the setting of her QT prolongation. She would therefore benefit from transition from methadone to suboxone. Given her high dose, pregnancy, and co-morbid conditions, this should be done as an inpatient.  Will tentatively plan for patient to be admitted on 03/26/2021 in the morning. She will need to take her last dose of methadone on the morning of 03/25/2021. I will leave admission orders and a detailed consult note with the schedule for her induction.  FYI to [facility] clinical staff, please discuss this plan with Gabrielle Nguyen if she calls and continue to try and outreach to her.

## 2021-03-21 MED ORDER — ALPRAZOLAM 1 MG PO TABS: 1.0000 mg | ORAL_TABLET | Freq: Two times a day (BID) | ORAL | 0 refills | Status: DC

## 2021-03-21 NOTE — Telephone Encounter (Signed)
Called patient and spoke with her about possibly switching from methadone to suboxone.   Patient reports she has been on methadone for about two years now without issues. In the past month has been dealing with hypermesis and having significant anti-emetic needs. At last MAU visit her Qtc was 564.   She was contacted earlier by clinical staff and told we were hoping to admit her to inpatient and transition to suboxone given concerns over her long QT combined with her hyperemesis as well as Takotsubo cardiomyopathy.  Today patient reports she is feeling significantly better. She has been doing split dosing of her methadone at her clinic as well as resuming her Xanax which she has taken chronically since around age 26 and feels that both of these have improved her nausea symptoms tremendously. She is no longer needing frequent anti-emetics. She spoke with her doctor at the methadone clinic Dr. Adair Patter who is hesitant to have her switch to Suboxone given she has been stable for the past few years, and she would like to speak with me and Kyler's cardiologist prior to doing this.   Given this additional history I think it's very reasonable to defer until I have spoke with Dr. Adair Patter, and luckily [place] also has an appt with her cardiologist this coming Monday and then an appt with me in one week on Wednesday. Given she has had two years on methadone without problems with her QT, this issue is likely being driven by heavy use of anti-emetics in combination with her methadone. If her symptoms are markedly improved and remain so, and her QT is normal at her upcoming cardiology appointment, then we could definitely delay any transition (if still desired) to the postpartum period. If Qtc remains prolonged at upcoming visit, however, given her other cardiac issues and my clinical experience that hyperemesis is often relapsing/remitting for most patients, I think we should definitely transition her as an  inpatient.  I've consulted with an addiction specialist colleague and discussed with [place] that we have a plan in place that will hopefully allow for a smooth transition without any withdrawal symptoms, but regardless we'll wait for the above to play out before making a decision.   Patient will also run out of Xanax soon, refill until our appt was sent. Patient reports hx of withdrawal seizures when she is unable to take this medication, and it should never be withheld as she will require a slow taper to avoid further withdrawal complications. She reports she will be establishing care with [facility] soon, who will take over prescribing responsibilities for this medication.   Total time spent on the phone and documenting 44 minutes.

## 2021-03-21 NOTE — Addendum Note (Signed)
Addended by: Merian Capron on: 03/21/2021 12:46 PM   Modules accepted: Orders

## 2021-03-21 NOTE — Telephone Encounter (Signed)
Call placed to pt. Spoke with pt. Pt given recommendations per Dr Crissie Reese. Pt desires to talk with Dr Crissie Reese in more detail for plan of care. Pt advised will give message to Dr Crissie Reese to return call. Phone number verified.   Judeth Cornfield, RN

## 2021-03-23 LAB — OB RESULTS CONSOLE GC/CHLAMYDIA: Gonorrhea: NEGATIVE

## 2021-03-26 ENCOUNTER — Other Ambulatory Visit: Payer: Self-pay

## 2021-03-26 ENCOUNTER — Other Ambulatory Visit: Payer: Self-pay | Admitting: Family Medicine

## 2021-03-26 ENCOUNTER — Other Ambulatory Visit (HOSPITAL_COMMUNITY): Payer: Self-pay

## 2021-03-26 ENCOUNTER — Encounter (HOSPITAL_COMMUNITY): Payer: Self-pay

## 2021-03-26 ENCOUNTER — Other Ambulatory Visit: Payer: Self-pay | Admitting: Obstetrics & Gynecology

## 2021-03-26 ENCOUNTER — Encounter: Payer: Self-pay | Admitting: Family Medicine

## 2021-03-26 ENCOUNTER — Other Ambulatory Visit (HOSPITAL_COMMUNITY): Payer: Self-pay | Admitting: *Deleted

## 2021-03-26 ENCOUNTER — Ambulatory Visit (HOSPITAL_COMMUNITY)
Admission: RE | Admit: 2021-03-26 | Discharge: 2021-03-26 | Disposition: A | Discharge status: Home / Self Care | Payer: Medicaid Other | Source: Ambulatory Visit | Attending: Cardiology | Admitting: Cardiology

## 2021-03-26 VITALS — BP 88/48 | HR 60 | Wt 108.4 lb

## 2021-03-26 DIAGNOSIS — F410 Panic disorder [episodic paroxysmal anxiety] without agoraphobia: Secondary | ICD-10-CM

## 2021-03-26 DIAGNOSIS — I5181 Takotsubo syndrome: Secondary | ICD-10-CM

## 2021-03-26 DIAGNOSIS — Z79899 Other long term (current) drug therapy: Secondary | ICD-10-CM | POA: Diagnosis not present

## 2021-03-26 DIAGNOSIS — O210 Mild hyperemesis gravidarum: Secondary | ICD-10-CM

## 2021-03-26 DIAGNOSIS — I5022 Chronic systolic (congestive) heart failure: Secondary | ICD-10-CM | POA: Diagnosis not present

## 2021-03-26 DIAGNOSIS — F1190 Opioid use, unspecified, uncomplicated: Secondary | ICD-10-CM | POA: Insufficient documentation

## 2021-03-26 DIAGNOSIS — F419 Anxiety disorder, unspecified: Secondary | ICD-10-CM | POA: Insufficient documentation

## 2021-03-26 DIAGNOSIS — R9431 Abnormal electrocardiogram [ECG] [EKG]: Secondary | ICD-10-CM | POA: Diagnosis not present

## 2021-03-26 DIAGNOSIS — Z3A23 23 weeks gestation of pregnancy: Secondary | ICD-10-CM | POA: Insufficient documentation

## 2021-03-26 DIAGNOSIS — F418 Other specified anxiety disorders: Secondary | ICD-10-CM

## 2021-03-26 MED ORDER — ONDANSETRON 8 MG PO TBDP: 8.0000 mg | ORAL_TABLET | Freq: Three times a day (TID) | ORAL | 2 refills | Status: DC

## 2021-03-26 MED ORDER — ALPRAZOLAM 1 MG PO TABS: 1.0000 mg | ORAL_TABLET | Freq: Two times a day (BID) | ORAL | 0 refills | Status: DC

## 2021-03-26 MED ORDER — METOPROLOL SUCCINATE ER 25 MG PO TB24: 12.5000 mg | ORAL_TABLET | Freq: Two times a day (BID) | ORAL | 3 refills | Status: DC

## 2021-03-26 NOTE — Progress Notes (Addendum)
PCP: Dr. Dario Guardian HF Cardiologist: Dr. Gala Romney  HPI:  Gabrielle Nguyen is a 25 year old with a history of G4 P2012 23 weeks, prior substance abuse, on methodone,  depression, anxieity, chronic pain, endometriosis and systolic HF. 6 years ago had palpitations after birth of her son.   2 years ago prescribed Roxicet for endometriosis. Once roxicet was not longer prescribed she had withdrawal and started buying drugs off the street.  Started on methodone.   2 recent admits for hyperemesis.    Presented to [facility] 02/02/21 with hyperemesis> [redacted] weeks pregnant.  UDS + opiates & benzodiazepines (took fentanyl). 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 Bnp 1341. Repeat echo showed EF 35-40%, hospitalization c/b prolonged QTc, sertraline cut back.   On 02/19/21 she returned for HF follow up. Overall felt more anxious, had been taking xanax twice a day for panic attacks. SOB & CP with panic attacks, resolved afterwards. Denied increasing SOB, CP, dizziness, edema, or PND/Orthopnea. Appetite poor, struggling with hyperemesis, required Zofran every other day. No fever or chills. Weight at home 111 pounds. Taking all medications. Had appt with [facility] following month.  Today she returns to HF clinic for pharmacist medication titration. At last visit with FNP, Toprol XL was decreased to 12.5 mg BID with low BP. Overall, patient is feeling good today. Denies any SOB, PND, or orthopnea. Can see a noticeable difference when she takes her medications and when she doesn't. States that yesterday she had a near syncopal event when going to get her methadone injection. She states she has palpitations but associates this with her anxiety > heart failure. She walks with her daughter at least once a day about 1 mile to and from her house. She has been weighing herself at home and has lost about 5 lbs due to her emetic episodes. Has been out of her Xanax, Metoprolol and Zofran for about two  days.   HF Medications: Toprol XL 12.5 mg BID  Has the patient been experiencing any side effects to the medications prescribed?  No  Understanding of regimen: good Understanding of indications: good Potential of compliance: fair Patient understands to avoid NSAIDs. Patient understands to avoid decongestants.   Pertinent Lab Values: As of 7/7: Serum creatinine 0.72, BUN 8, Potassium 3.0, Sodium 135, BNP 280.2  Vital Signs: Weight: 108 lbs (last clinic weight: 112 lbs) Blood pressure: 88/48  Heart rate: 60   Assessment/Plan: 1. Chronic Systolic HF - Echo (6/22): EF 51-76%, severe HK of apical 2/3 left ventricle -->possible takotusubo CM. Has been out of metoprolol x 2 days. - NYHA I, volume good today. Weight down 4 lbs. - Continue Toprol XL 12.5 mg BID - No ACE/ARB/ARNI/spiro or digoxin with pregnancy. - No SGLT2i - not recommended in 2nd or 3rd trimester of pregnancy   2. QT prolongation - More prominent on ECG on 02/19/21. 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 Should improve soon, however inverted T waves suggestive of ischemia. EKG today with QTc 453 but has not been taking medications. - Continue Sertraline 50 mg daily and Zofran as needed   3. Opiate use - On methadone will need to continue.    4. Anxiety  - Having frequent panic attacks requiring daily xanax use, long history of trauma. - Continue sertraline  5. Pregnancy 23 weeks - Followed by Dr. Jolayne Panther, OBGYN.  Follow up with HF NP/PA in 1 month.  Asencion Gowda PharmD/MBA Candidate [employer], [employer]  [address]  Theotis Burrow, PharmD, BCPS Advanced Heart Failure Clinic Pharmacist [phone]

## 2021-03-26 NOTE — Patient Instructions (Signed)
It was a pleasure seeing you today!  MEDICATIONS: -No medication changes today -Please ask your pharmacy to refill the metoprolol and Zofran. They should be able to complete an insurance override since the medications were lost/damaged -Call if you have questions about your medications.   NEXT APPOINTMENT: Return to clinic in 1 month with HF NP/PA.  In general, to take care of your heart failure: -Limit your fluid intake to 2 Liters (half-gallon) per day.   -Limit your salt intake to ideally 2-3 grams (2000-3000 mg) per day. -Weigh yourself daily and record, and bring that "weight diary" to your next appointment.  (Weight gain of 2-3 pounds in 1 day typically means fluid weight.) -The medications for your heart are to help your heart and help you live longer.   -Please contact us before stopping any of your heart medications.  Call the clinic at [phone] with questions or to reschedule future appointments.

## 2021-03-28 ENCOUNTER — Ambulatory Visit (INDEPENDENT_AMBULATORY_CARE_PROVIDER_SITE_OTHER): Payer: Medicaid Other | Admitting: Family Medicine

## 2021-03-28 ENCOUNTER — Other Ambulatory Visit: Payer: Self-pay

## 2021-03-28 ENCOUNTER — Encounter: Payer: Self-pay | Admitting: Family Medicine

## 2021-03-28 VITALS — BP 97/55 | HR 63 | Wt 114.4 lb

## 2021-03-28 DIAGNOSIS — F1190 Opioid use, unspecified, uncomplicated: Secondary | ICD-10-CM

## 2021-03-28 DIAGNOSIS — F410 Panic disorder [episodic paroxysmal anxiety] without agoraphobia: Secondary | ICD-10-CM

## 2021-03-28 DIAGNOSIS — F418 Other specified anxiety disorders: Secondary | ICD-10-CM

## 2021-03-28 DIAGNOSIS — I5181 Takotsubo syndrome: Secondary | ICD-10-CM

## 2021-03-28 DIAGNOSIS — Z5941 Food insecurity: Secondary | ICD-10-CM

## 2021-03-28 DIAGNOSIS — O0990 Supervision of high risk pregnancy, unspecified, unspecified trimester: Secondary | ICD-10-CM

## 2021-03-28 DIAGNOSIS — R9431 Abnormal electrocardiogram [ECG] [EKG]: Secondary | ICD-10-CM

## 2021-03-28 DIAGNOSIS — O210 Mild hyperemesis gravidarum: Secondary | ICD-10-CM

## 2021-03-28 MED ORDER — ALPRAZOLAM 1 MG PO TABS
1.0000 mg | ORAL_TABLET | Freq: Two times a day (BID) | ORAL | 0 refills | Status: AC
Start: 2021-03-28 — End: 2021-04-11

## 2021-03-28 NOTE — Progress Notes (Signed)
   Subjective:  Gabrielle Nguyen is a 25 y.o. [phone] at [redacted]w[redacted]d being seen today for ongoing prenatal care.  She is currently monitored for the following issues for this high-risk pregnancy and has MDD (major depressive disorder); Panic disorder; Asthma; Anxiety with depression; Normocytic anemia; Opioid use disorder; Hyperemesis affecting pregnancy, antepartum; Prolonged Q-T interval on ECG; Elevated troponin; Acute systolic CHF (congestive heart failure) (HCC); Takotsubo cardiomyopathy; PTSD (post-traumatic stress disorder); and Supervision of high risk pregnancy, antepartum on their problem list.  Patient reports no complaints.  Contractions: Not present. Vag. Bleeding: None.  Movement: Present. Denies leaking of fluid.   The following portions of the patient's history were reviewed and updated as appropriate: allergies, current medications, past family history, past medical history, past social history, past surgical history and problem list. Problem list updated.  Objective:   Vitals:   03/28/21 0919  BP: (!) 97/55  Pulse: 63  Weight: 114 lb 6.4 oz (51.9 kg)    Fetal Status: Fetal Heart Rate (bpm): 154   Movement: Present     General:  Alert, oriented and cooperative. Patient is in no acute distress.  Skin: Skin is warm and dry. No rash noted.   Cardiovascular: Normal heart rate noted  Respiratory: Normal respiratory effort, no problems with respiration noted  Abdomen: Soft, gravid, appropriate for gestational age. Pain/Pressure: Absent     Pelvic: Vag. Bleeding: None     Cervical exam deferred        Extremities: Normal range of motion.  Edema: Trace  Mental Status: Normal mood and affect. Normal behavior. Normal judgment and thought content.   Urinalysis:      Assessment and Plan:  Pregnancy: [id number] at [redacted]w[redacted]d  1. Food insecurity  - AMBULATORY REFERRAL TO [facility]  2. Supervision of high risk pregnancy, antepartum BP and FHR normal Has anatomy scan scheduled for  04/17/21  3. Prolonged Q-T interval on ECG Seen in Cardiology office earlier this week, QTc 453 Taking anti-emetics sparingly, feels that her anxiety meds also help immensely with her symptoms and overall feels very well and is gaining weight Given overall history appears that her long QT was due to combination of methadone and heavy use of anti-emetics If severe symptoms return we should consider transitioning to suboxone, but for time being she should continue on methadone  4. Opioid use disorder Stable for two years on methadone 150mg  daily Verbal consent obtained for UDS today  5. Hyperemesis affecting pregnancy, antepartum Symptoms resolving OK for sparing use of zofran which is effective for her symptoms  6. Takotsubo cardiomyopathy Following w Cardiology HF clinic, also seeing Cardio OB on 04/06/21  7. Anxiety with depression On Xanax 1g BID for many years, also helping w nausea symptoms Trying to establish care w 06/06/21, will refill for time being until she establishes psychiatric care Given letter for [facility] explaining her meds and OK to continue in pregnancy  Preterm labor symptoms and general obstetric precautions including but not limited to vaginal bleeding, contractions, leaking of fluid and fetal movement were reviewed in detail with the patient. Please refer to After Visit Summary for other counseling recommendations.  Return in 2 weeks (on 04/11/2021).   06/11/2021, MD

## 2021-03-28 NOTE — Patient Instructions (Signed)

## 2021-03-29 LAB — PAIN MGT SCRN (14 DRUGS), UR
Amphetamine Scrn, Ur: NEGATIVE ng/mL
BARBITURATE SCREEN URINE: NEGATIVE ng/mL
BENZODIAZEPINE SCREEN, URINE: POSITIVE ng/mL — AB
Buprenorphine, Urine: NEGATIVE ng/mL
CANNABINOIDS UR QL SCN: POSITIVE ng/mL — AB
Cocaine (Metab) Scrn, Ur: NEGATIVE ng/mL
Creatinine(Crt), U: 44.3 mg/dL (ref 20.0–300.0)
Fentanyl, Urine: POSITIVE pg/mL — AB
Meperidine Screen, Urine: NEGATIVE ng/mL
Methadone Screen, Urine: POSITIVE ng/mL — AB
OXYCODONE+OXYMORPHONE UR QL SCN: NEGATIVE ng/mL
Opiate Scrn, Ur: POSITIVE ng/mL — AB
Ph of Urine: 6.7 (ref 4.5–8.9)
Phencyclidine Qn, Ur: NEGATIVE ng/mL
Propoxyphene Scrn, Ur: NEGATIVE ng/mL
Tramadol Screen, Urine: NEGATIVE ng/mL

## 2021-04-03 NOTE — Telephone Encounter (Signed)
Dealt with in another encounter

## 2021-04-05 ENCOUNTER — Encounter: Payer: Self-pay | Admitting: *Deleted

## 2021-04-06 ENCOUNTER — Ambulatory Visit: Payer: Medicaid Other | Admitting: Cardiology

## 2021-04-10 ENCOUNTER — Telehealth: Payer: Self-pay

## 2021-04-10 NOTE — Telephone Encounter (Signed)
Patient called and left VM on nurse line requesting a letter to dentist so that she may have dental procedure.Procedure to be done by [employer]. Letter created and sent via MyChart. Called pt; VM left stating letter is in MyChart.

## 2021-04-13 ENCOUNTER — Other Ambulatory Visit: Payer: Self-pay | Admitting: Family Medicine

## 2021-04-13 DIAGNOSIS — F410 Panic disorder [episodic paroxysmal anxiety] without agoraphobia: Secondary | ICD-10-CM

## 2021-04-13 DIAGNOSIS — F418 Other specified anxiety disorders: Secondary | ICD-10-CM

## 2021-04-13 MED ORDER — ALPRAZOLAM 1 MG PO TABS: 1.0000 mg | ORAL_TABLET | Freq: Two times a day (BID) | ORAL | 0 refills | Status: DC

## 2021-04-13 MED ORDER — SERTRALINE HCL 50 MG PO TABS: 50.0000 mg | ORAL_TABLET | Freq: Every day | ORAL | 3 refills | Status: DC

## 2021-04-17 ENCOUNTER — Other Ambulatory Visit: Payer: Self-pay | Admitting: Family Medicine

## 2021-04-17 ENCOUNTER — Ambulatory Visit: Payer: Medicaid Other | Admitting: *Deleted

## 2021-04-17 ENCOUNTER — Ambulatory Visit: Payer: Medicaid Other | Attending: Family Medicine

## 2021-04-17 ENCOUNTER — Ambulatory Visit (HOSPITAL_BASED_OUTPATIENT_CLINIC_OR_DEPARTMENT_OTHER): Payer: Medicaid Other | Admitting: Maternal & Fetal Medicine

## 2021-04-17 ENCOUNTER — Other Ambulatory Visit: Payer: Self-pay | Admitting: *Deleted

## 2021-04-17 ENCOUNTER — Encounter: Payer: Medicaid Other | Admitting: Family Medicine

## 2021-04-17 ENCOUNTER — Other Ambulatory Visit: Payer: Self-pay

## 2021-04-17 VITALS — BP 96/54 | HR 62

## 2021-04-17 DIAGNOSIS — I429 Cardiomyopathy, unspecified: Secondary | ICD-10-CM | POA: Diagnosis not present

## 2021-04-17 DIAGNOSIS — O0990 Supervision of high risk pregnancy, unspecified, unspecified trimester: Secondary | ICD-10-CM

## 2021-04-17 DIAGNOSIS — N883 Incompetence of cervix uteri: Secondary | ICD-10-CM | POA: Insufficient documentation

## 2021-04-17 DIAGNOSIS — Z362 Encounter for other antenatal screening follow-up: Secondary | ICD-10-CM
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
## 2021-04-18 ENCOUNTER — Encounter: Payer: Medicaid Other | Admitting: Family Medicine

## 2021-04-20 ENCOUNTER — Encounter: Payer: Self-pay | Admitting: Family Medicine

## 2021-04-20 ENCOUNTER — Telehealth: Payer: Self-pay

## 2021-04-20 DIAGNOSIS — Z7189 Other specified counseling: Secondary | ICD-10-CM | POA: Insufficient documentation

## 2021-04-20 NOTE — Telephone Encounter (Signed)
Called patient to move her appointment to 9/9 and let her know Dr. Servando Salina wants her to have an echo. No answer, unable to leave a message at this time.

## 2021-04-26 ENCOUNTER — Inpatient Hospital Stay (HOSPITAL_BASED_OUTPATIENT_CLINIC_OR_DEPARTMENT_OTHER): Payer: Medicaid Other

## 2021-04-26 ENCOUNTER — Other Ambulatory Visit: Payer: Self-pay

## 2021-04-26 ENCOUNTER — Inpatient Hospital Stay (HOSPITAL_COMMUNITY)
Admission: AD | Admit: 2021-04-26 | Discharge: 2021-04-26 | Disposition: A | Discharge status: Home / Self Care | Payer: Medicaid Other | Attending: Obstetrics & Gynecology | Admitting: Obstetrics & Gynecology

## 2021-04-26 ENCOUNTER — Encounter (HOSPITAL_COMMUNITY): Payer: Self-pay | Admitting: Obstetrics & Gynecology

## 2021-04-26 ENCOUNTER — Other Ambulatory Visit (HOSPITAL_COMMUNITY): Payer: Self-pay

## 2021-04-26 DIAGNOSIS — O99012 Anemia complicating pregnancy, second trimester: Secondary | ICD-10-CM

## 2021-04-26 DIAGNOSIS — O26892 Other specified pregnancy related conditions, second trimester: Secondary | ICD-10-CM | POA: Insufficient documentation

## 2021-04-26 DIAGNOSIS — F4310 Post-traumatic stress disorder, unspecified: Secondary | ICD-10-CM | POA: Diagnosis not present

## 2021-04-26 DIAGNOSIS — Z7982 Long term (current) use of aspirin: Secondary | ICD-10-CM | POA: Diagnosis not present

## 2021-04-26 DIAGNOSIS — Z79899 Other long term (current) drug therapy: Secondary | ICD-10-CM | POA: Diagnosis not present

## 2021-04-26 DIAGNOSIS — O26899 Other specified pregnancy related conditions, unspecified trimester: Secondary | ICD-10-CM

## 2021-04-26 DIAGNOSIS — O0990 Supervision of high risk pregnancy, unspecified, unspecified trimester: Secondary | ICD-10-CM

## 2021-04-26 DIAGNOSIS — I5181 Takotsubo syndrome: Secondary | ICD-10-CM | POA: Insufficient documentation

## 2021-04-26 DIAGNOSIS — O99322 Drug use complicating pregnancy, second trimester: Secondary | ICD-10-CM

## 2021-04-26 DIAGNOSIS — Z3A24 24 weeks gestation of pregnancy: Secondary | ICD-10-CM

## 2021-04-26 DIAGNOSIS — O99512 Diseases of the respiratory system complicating pregnancy, second trimester: Secondary | ICD-10-CM

## 2021-04-26 DIAGNOSIS — O321XX0 Maternal care for breech presentation, not applicable or unspecified: Secondary | ICD-10-CM

## 2021-04-26 DIAGNOSIS — F1920 Other psychoactive substance dependence, uncomplicated: Secondary | ICD-10-CM | POA: Diagnosis not present

## 2021-04-26 DIAGNOSIS — F32A Depression, unspecified: Secondary | ICD-10-CM | POA: Diagnosis not present

## 2021-04-26 DIAGNOSIS — Z87891 Personal history of nicotine dependence: Secondary | ICD-10-CM | POA: Diagnosis not present

## 2021-04-26 DIAGNOSIS — O99412 Diseases of the circulatory system complicating pregnancy, second trimester: Secondary | ICD-10-CM

## 2021-04-26 DIAGNOSIS — O99342 Other mental disorders complicating pregnancy, second trimester: Secondary | ICD-10-CM

## 2021-04-26 DIAGNOSIS — F419 Anxiety disorder, unspecified: Secondary | ICD-10-CM | POA: Insufficient documentation

## 2021-04-26 DIAGNOSIS — J45909 Unspecified asthma, uncomplicated: Secondary | ICD-10-CM

## 2021-04-26 DIAGNOSIS — D649 Anemia, unspecified: Secondary | ICD-10-CM

## 2021-04-26 DIAGNOSIS — I2510 Atherosclerotic heart disease of native coronary artery without angina pectoris: Secondary | ICD-10-CM | POA: Diagnosis not present

## 2021-04-26 DIAGNOSIS — F1990 Other psychoactive substance use, unspecified, uncomplicated: Secondary | ICD-10-CM

## 2021-04-26 DIAGNOSIS — O3432 Maternal care for cervical incompetence, second trimester: Secondary | ICD-10-CM | POA: Insufficient documentation

## 2021-04-26 DIAGNOSIS — R109 Unspecified abdominal pain: Secondary | ICD-10-CM | POA: Diagnosis not present

## 2021-04-26 DIAGNOSIS — Q249 Congenital malformation of heart, unspecified: Secondary | ICD-10-CM

## 2021-04-26 MED ORDER — PROGESTERONE 200 MG PO CAPS: 200.0000 mg | ORAL_CAPSULE | Freq: Every day | ORAL | 11 refills | Status: DC
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
## 2021-04-26 NOTE — MAU Note (Signed)
Presents with c/o intermittent cramping that began @ approx 1430 this afternoon.  States was "really stressed out". Denies VB or LOF.  Endorses +FM.  Denies recent intercourse.

## 2021-04-27 ENCOUNTER — Ambulatory Visit (INDEPENDENT_AMBULATORY_CARE_PROVIDER_SITE_OTHER): Payer: Medicaid Other | Admitting: Family Medicine

## 2021-04-27 ENCOUNTER — Encounter: Payer: Self-pay | Admitting: Family Medicine

## 2021-04-27 ENCOUNTER — Encounter: Payer: Medicaid Other | Admitting: Family Medicine

## 2021-04-27 VITALS — BP 73/27 | HR 85 | Wt 117.6 lb

## 2021-04-27 DIAGNOSIS — I5181 Takotsubo syndrome: Secondary | ICD-10-CM

## 2021-04-27 DIAGNOSIS — F1190 Opioid use, unspecified, uncomplicated: Secondary | ICD-10-CM

## 2021-04-27 DIAGNOSIS — O0990 Supervision of high risk pregnancy, unspecified, unspecified trimester: Secondary | ICD-10-CM

## 2021-04-27 DIAGNOSIS — O3432 Maternal care for cervical incompetence, second trimester: Secondary | ICD-10-CM | POA: Insufficient documentation

## 2021-04-27 DIAGNOSIS — Z3A25 25 weeks gestation of pregnancy: Secondary | ICD-10-CM

## 2021-04-27 DIAGNOSIS — O210 Mild hyperemesis gravidarum: Secondary | ICD-10-CM

## 2021-04-27 DIAGNOSIS — F4310 Post-traumatic stress disorder, unspecified: Secondary | ICD-10-CM

## 2021-04-27 DIAGNOSIS — Z7189 Other specified counseling: Secondary | ICD-10-CM
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
## 2021-04-30 ENCOUNTER — Other Ambulatory Visit: Payer: Self-pay | Admitting: Family Medicine

## 2021-04-30 ENCOUNTER — Telehealth: Payer: Self-pay | Admitting: General Practice

## 2021-04-30 ENCOUNTER — Other Ambulatory Visit: Payer: Self-pay

## 2021-04-30 ENCOUNTER — Ambulatory Visit (HOSPITAL_COMMUNITY): Payer: Medicaid Other | Admitting: Family

## 2021-04-30 DIAGNOSIS — F418 Other specified anxiety disorders: Secondary | ICD-10-CM

## 2021-04-30 MED ORDER — ALPRAZOLAM 1 MG PO TABS: 1.0000 mg | ORAL_TABLET | Freq: Two times a day (BID) | ORAL | 0 refills | Status: DC

## 2021-04-30 NOTE — Telephone Encounter (Signed)
Patient called into front office requesting refills on xanax & zoloft. Patient states that she missed her behavioral health appt this morning because they changed the time on her and no one told her and the earliest rescheduled appt they had is the end of the month. Patient states she is taking xanax BID and a 1/2 pill as needed for panic attacks which she has had to take a couple times. Patient has 4.5 pills left and picked up Rx on 8/12. Patient states she is taking zoloft qhs and has 2 pills left. Per pharmacy, patient picked up zoloft on 8/11 #90. Per Dr Crissie Reese, he will refill xanax that should be enough to last until next appt but none additionally before then. Informed patient and discussed picked up Rx for Zoloft #90 on 8/11. Patient states that her husband picks up her prescriptions and she wonders if he has it somewhere and her current bottle is an old prescription. Patient states she will check with him and understands why refill cannot be provided. Patient had no other questions & will follow up on 9/7.

## 2021-04-30 NOTE — Progress Notes (Signed)
Refill of xanax Missed BH appt this morning, RN spoke with patient and reviewed meds left at home, she has an appropriate number. Stressed importance of getting into [facility] as we will not be long term prescribers.

## 2021-04-30 NOTE — Progress Notes (Unsigned)
Attempted to reach patient by Caregility and telephone. " The voice mail box is full." Patient to reschedule

## 2021-05-01 ENCOUNTER — Encounter (HOSPITAL_COMMUNITY): Payer: Self-pay | Admitting: Family
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
## 2021-05-02 ENCOUNTER — Ambulatory Visit (HOSPITAL_COMMUNITY)
Admission: RE | Admit: 2021-05-02 | Discharge: 2021-05-02 | Disposition: A | Discharge status: Home / Self Care | Payer: Medicaid Other | Source: Ambulatory Visit | Attending: Family Medicine | Admitting: Family Medicine

## 2021-05-02 ENCOUNTER — Encounter (HOSPITAL_COMMUNITY): Payer: Self-pay

## 2021-05-02 ENCOUNTER — Other Ambulatory Visit: Payer: Self-pay

## 2021-05-02 VITALS — BP 88/58 | HR 64 | Wt 120.4 lb

## 2021-05-02 DIAGNOSIS — Z79899 Other long term (current) drug therapy: Secondary | ICD-10-CM | POA: Insufficient documentation

## 2021-05-02 DIAGNOSIS — F1190 Opioid use, unspecified, uncomplicated: Secondary | ICD-10-CM | POA: Insufficient documentation

## 2021-05-02 DIAGNOSIS — Z7982 Long term (current) use of aspirin: Secondary | ICD-10-CM | POA: Diagnosis not present

## 2021-05-02 DIAGNOSIS — I5022 Chronic systolic (congestive) heart failure: Secondary | ICD-10-CM | POA: Diagnosis present

## 2021-05-02 DIAGNOSIS — F419 Anxiety disorder, unspecified: Secondary | ICD-10-CM | POA: Diagnosis not present

## 2021-05-02 DIAGNOSIS — O99342 Other mental disorders complicating pregnancy, second trimester: Secondary | ICD-10-CM | POA: Diagnosis not present

## 2021-05-02 DIAGNOSIS — R9431 Abnormal electrocardiogram [ECG] [EKG]: Secondary | ICD-10-CM | POA: Diagnosis not present

## 2021-05-02 DIAGNOSIS — I5181 Takotsubo syndrome: Secondary | ICD-10-CM | POA: Diagnosis not present

## 2021-05-02 DIAGNOSIS — O99322 Drug use complicating pregnancy, second trimester: Secondary | ICD-10-CM | POA: Insufficient documentation

## 2021-05-02 DIAGNOSIS — O99412 Diseases of the circulatory system complicating pregnancy, second trimester: Secondary | ICD-10-CM | POA: Diagnosis not present

## 2021-05-02 DIAGNOSIS — Z3A25 25 weeks gestation of pregnancy: Secondary | ICD-10-CM | POA: Insufficient documentation

## 2021-05-02 LAB — BASIC METABOLIC PANEL
Anion gap: 7 (ref 5–15)
BUN: 9 mg/dL (ref 6–20)
CO2: 25 mmol/L (ref 22–32)
Calcium: 9.1 mg/dL (ref 8.9–10.3)
Chloride: 102 mmol/L (ref 98–111)
Creatinine, Ser: 0.56 mg/dL (ref 0.44–1.00)
GFR, Estimated: >60 mL/min (ref >60)
Glucose, Bld: 91 mg/dL (ref 70–99)
Potassium: 3.8 mmol/L (ref 3.5–5.1)
Sodium: 134 mmol/L — ABNORMAL LOW (ref 135–145)

## 2021-05-02 NOTE — Patient Instructions (Signed)
Routine lab work today. Will notify you of abnormal results  Follow up and echo in 2-38months  Do the following things EVERYDAY: Weigh yourself in the morning before breakfast. Write it down and keep it in a log. Take your medicines as prescribed Eat low salt foods--Limit salt (sodium) to 2000 mg per day.  Stay as active as you can everyday Limit all fluids for the day to less than 2 liters

## 2021-05-09 ENCOUNTER — Other Ambulatory Visit: Payer: Medicaid Other

## 2021-05-09 ENCOUNTER — Ambulatory Visit (INDEPENDENT_AMBULATORY_CARE_PROVIDER_SITE_OTHER): Payer: Medicaid Other | Admitting: Family Medicine

## 2021-05-09 ENCOUNTER — Encounter: Payer: Self-pay | Admitting: Family Medicine

## 2021-05-09 ENCOUNTER — Other Ambulatory Visit: Payer: Self-pay

## 2021-05-09 ENCOUNTER — Other Ambulatory Visit: Payer: Self-pay | Admitting: General Practice

## 2021-05-09 VITALS — BP 111/67 | HR 86 | Wt 120.4 lb

## 2021-05-09 DIAGNOSIS — Z23 Encounter for immunization: Secondary | ICD-10-CM | POA: Diagnosis not present

## 2021-05-09 DIAGNOSIS — O210 Mild hyperemesis gravidarum: Secondary | ICD-10-CM

## 2021-05-09 DIAGNOSIS — I5181 Takotsubo syndrome: Secondary | ICD-10-CM

## 2021-05-09 DIAGNOSIS — F1190 Opioid use, unspecified, uncomplicated: Secondary | ICD-10-CM

## 2021-05-09 DIAGNOSIS — F418 Other specified anxiety disorders: Secondary | ICD-10-CM

## 2021-05-09 DIAGNOSIS — O0990 Supervision of high risk pregnancy, unspecified, unspecified trimester: Secondary | ICD-10-CM

## 2021-05-09 DIAGNOSIS — O3432 Maternal care for cervical incompetence, second trimester: Secondary | ICD-10-CM

## 2021-05-09 DIAGNOSIS — F4310 Post-traumatic stress disorder, unspecified: Secondary | ICD-10-CM

## 2021-05-09 DIAGNOSIS — Z7189 Other specified counseling: Secondary | ICD-10-CM

## 2021-05-09 MED ORDER — ALPRAZOLAM 1 MG PO TABS: 1.0000 mg | ORAL_TABLET | Freq: Two times a day (BID) | ORAL | 0 refills | Status: DC

## 2021-05-09 MED ORDER — FLUCONAZOLE 150 MG PO TABS: 150.0000 mg | ORAL_TABLET | Freq: Once | ORAL | 0 refills | Status: AC
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
## 2021-05-09 NOTE — Progress Notes (Signed)
Patient reports taking a Delta 8 cannabis gummy "a couple weeks ago". States this was due to her feeling sick on her stomach

## 2021-05-10 ENCOUNTER — Telehealth: Payer: Self-pay

## 2021-05-10 LAB — CBC
Hematocrit: 22.8 % — ABNORMAL LOW (ref 34.0–46.6)
Hemoglobin: 6.9 g/dL — CL (ref 11.1–15.9)
MCH: 22.6 pg — ABNORMAL LOW (ref 26.6–33.0)
MCHC: 30.3 g/dL — ABNORMAL LOW (ref 31.5–35.7)
MCV: 75 fL — ABNORMAL LOW (ref 79–97)
Platelets: 198 10*3/uL (ref 150–450)
RBC: 3.05 x10E6/uL — ABNORMAL LOW (ref 3.77–5.28)
RDW: 16.8 % — ABNORMAL HIGH (ref 11.7–15.4)
WBC: 7.7 10*3/uL (ref 3.4–10.8)

## 2021-05-10 LAB — HIV ANTIBODY (ROUTINE TESTING W REFLEX): HIV Screen 4th Generation wRfx: NONREACTIVE

## 2021-05-10 LAB — RPR: RPR Ser Ql: NONREACTIVE

## 2021-05-10 NOTE — Telephone Encounter (Addendum)
-----   Message from Venora Maples, MD sent at 05/10/2021 10:51 AM EDT ----- HIV and RPR normal CBC with critical anemia, hgb 6.9, given heart failure needs to receive 1-2u pRBC Attempted to call both home and cell phones, no answer, voicemail left on cell phone asking her to call the clinic back and I also sent a my chart message (see below) Clinical pool please continue to reach out to patient. Plan will be for her to be admitted for observation to [facility] specialty care to receive blood.   Attempted to contact pt at both numbers unable to leave message.     1558- Called pt unable to leave message.  Leonette Nutting  05/10/21

## 2021-05-11 ENCOUNTER — Encounter: Payer: Self-pay | Admitting: Family Medicine

## 2021-05-11 NOTE — Telephone Encounter (Signed)
Pt concern is addressed via Mychart message that was routed to [place], MD.   Gabrielle Nguyen  05/11/21

## 2021-05-14 LAB — TOXASSURE SELECT 13 (MW), URINE

## 2021-05-16 ENCOUNTER — Encounter (HOSPITAL_COMMUNITY): Payer: Self-pay | Admitting: Psychiatry

## 2021-05-16 ENCOUNTER — Other Ambulatory Visit: Payer: Self-pay

## 2021-05-16 ENCOUNTER — Telehealth: Payer: Self-pay | Admitting: Lactation Services

## 2021-05-16 ENCOUNTER — Ambulatory Visit (INDEPENDENT_AMBULATORY_CARE_PROVIDER_SITE_OTHER): Payer: Medicaid Other | Admitting: Psychiatry

## 2021-05-16 ENCOUNTER — Encounter: Payer: Medicaid Other | Admitting: Family Medicine

## 2021-05-16 VITALS — BP 110/62 | HR 60 | Ht 66.0 in | Wt 122.0 lb

## 2021-05-16 DIAGNOSIS — F418 Other specified anxiety disorders: Secondary | ICD-10-CM | POA: Diagnosis present

## 2021-05-16 DIAGNOSIS — F411 Generalized anxiety disorder: Secondary | ICD-10-CM

## 2021-05-16 DIAGNOSIS — F331 Major depressive disorder, recurrent, moderate: Secondary | ICD-10-CM

## 2021-05-16 MED ORDER — XANAX 1 MG PO TABS: 1.0000 mg | ORAL_TABLET | Freq: Two times a day (BID) | ORAL | 0 refills | Status: AC

## 2021-05-16 MED ORDER — SERTRALINE HCL 100 MG PO TABS: 100.0000 mg | ORAL_TABLET | Freq: Every day | ORAL | 2 refills | Status: DC

## 2021-05-16 NOTE — Patient Instructions (Signed)
Follow-up in 2 weeks
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
## 2021-05-16 NOTE — Telephone Encounter (Signed)
Attempted to call patient after receiving After Hours report that patient was having abdominal pain in 9/10 and was advised to go to ED. There is no record that patient went to ED for evaluation. Patient did not answer, LM for her to call the office at her convenience.   My Chart message sent to patient.

## 2021-05-17 ENCOUNTER — Ambulatory Visit: Payer: Medicaid Other

## 2021-05-17 ENCOUNTER — Ambulatory Visit: Payer: Medicaid Other | Attending: Maternal & Fetal Medicine

## 2021-05-17 ENCOUNTER — Telehealth: Payer: Self-pay

## 2021-05-17 NOTE — Telephone Encounter (Signed)
Attempted to call patient to reschedule appt. Due to ultrasound tech. Out - no answer and mailbox full.

## 2021-05-24 ENCOUNTER — Other Ambulatory Visit: Payer: Self-pay

## 2021-05-24 DIAGNOSIS — O0990 Supervision of high risk pregnancy, unspecified, unspecified trimester: Secondary | ICD-10-CM

## 2021-05-25 ENCOUNTER — Other Ambulatory Visit: Payer: Medicaid Other

## 2021-05-25 ENCOUNTER — Ambulatory Visit: Payer: Medicaid Other | Admitting: Cardiology

## 2021-05-25 ENCOUNTER — Encounter: Payer: Medicaid Other | Admitting: Family Medicine

## 2021-06-07 ENCOUNTER — Other Ambulatory Visit: Payer: Medicaid Other

## 2021-06-13 ENCOUNTER — Ambulatory Visit (INDEPENDENT_AMBULATORY_CARE_PROVIDER_SITE_OTHER): Payer: Medicaid Other | Admitting: Family Medicine

## 2021-06-13 ENCOUNTER — Other Ambulatory Visit: Payer: Self-pay

## 2021-06-13 ENCOUNTER — Other Ambulatory Visit: Payer: Medicaid Other

## 2021-06-13 ENCOUNTER — Encounter: Payer: Self-pay | Admitting: Family Medicine

## 2021-06-13 ENCOUNTER — Telehealth: Payer: Self-pay

## 2021-06-13 VITALS — BP 108/75 | HR 98 | Wt 120.7 lb

## 2021-06-13 DIAGNOSIS — O210 Mild hyperemesis gravidarum: Secondary | ICD-10-CM | POA: Diagnosis not present

## 2021-06-13 DIAGNOSIS — O3432 Maternal care for cervical incompetence, second trimester: Secondary | ICD-10-CM

## 2021-06-13 DIAGNOSIS — I5181 Takotsubo syndrome: Secondary | ICD-10-CM | POA: Diagnosis not present

## 2021-06-13 DIAGNOSIS — Z7189 Other specified counseling: Secondary | ICD-10-CM

## 2021-06-13 DIAGNOSIS — O0990 Supervision of high risk pregnancy, unspecified, unspecified trimester: Secondary | ICD-10-CM | POA: Diagnosis not present

## 2021-06-13 DIAGNOSIS — F1190 Opioid use, unspecified, uncomplicated: Secondary | ICD-10-CM

## 2021-06-13 DIAGNOSIS — F418 Other specified anxiety disorders: Secondary | ICD-10-CM

## 2021-06-13 DIAGNOSIS — R9431 Abnormal electrocardiogram [ECG] [EKG]: Secondary | ICD-10-CM

## 2021-06-13 NOTE — Progress Notes (Signed)
Subjective:  Gabrielle Nguyen is a 25 y.o. [phone] at [redacted]w[redacted]d being seen today for ongoing prenatal care.  She is currently monitored for the following issues for this high-risk pregnancy and has MDD (major depressive disorder); Panic disorder; Asthma; Anxiety with depression; Normocytic anemia; Opioid use disorder; Hyperemesis affecting pregnancy, antepartum; Prolonged Q-T interval on ECG; Elevated troponin; Acute systolic CHF (congestive heart failure) (HCC); Takotsubo cardiomyopathy; PTSD (post-traumatic stress disorder); Supervision of high risk pregnancy, antepartum; Red Chart Rounds Patient; Cervical insufficiency during pregnancy in second trimester, antepartum; and Generalized anxiety disorder on their problem list.  Patient reports  grandmother died recently. Car is getting fixed today. Lots of life stressors .  Contractions: Not present. Vag. Bleeding: None.  Movement: Present. Denies leaking of fluid.   The following portions of the patient's history were reviewed and updated as appropriate: allergies, current medications, past family history, past medical history, past social history, past surgical history and problem list. Problem list updated.  Objective:   Vitals:   06/13/21 1145  BP: 108/75  Pulse: 98  Weight: 120 lb 11.2 oz (54.7 kg)    Fetal Status: Fetal Heart Rate (bpm): 141   Movement: Present     General:  Alert, oriented and cooperative. Patient is in no acute distress.  Skin: Skin is warm and dry. No rash noted.   Cardiovascular: Normal heart rate noted  Respiratory: Normal respiratory effort, no problems with respiration noted  Abdomen: Soft, gravid, appropriate for gestational age. Pain/Pressure: Present     Pelvic: Vag. Bleeding: None     Cervical exam deferred        Extremities: Normal range of motion.  Edema: Trace  Mental Status: Normal mood and affect. Normal behavior. Normal judgment and thought content.   Urinalysis:      Assessment and Plan:   Pregnancy: [id number] at [redacted]w[redacted]d  1. Supervision of high risk pregnancy, antepartum BP and FHR normal Grandmother passed away, going to [place] for the funeral and spending a week there Discussed hospital precautions - ToxASSURE Select 13 (MW), Urine - CBC - Comprehensive metabolic panel  2. Opioid use disorder Currently on methadone 170 mg daily Multiple prior UDS have not been in line with reported use Reports she does not know how clonazepam and fentanyl showed up in her last UDS from 05/09/21 Repeat UDS today Would like to do ROI for her methadone clinic drug screens, to be completed at checkout  3. Takotsubo cardiomyopathy Last saw cardiology on 05/02/21, continued on Toprol Plan for echo and visit with Dr. Gala Romney 06/2021, currently scheduled for 07/18/2021  4. Hyperemesis affecting pregnancy, antepartum Reports mostly controlled with her Xanax  5. Cervical insufficiency during pregnancy in second trimester, antepartum On anatomy scan had 1.6cm cervical length and mild funneling on 04/17/21 Subsequent scan in MAU 04/26/21 with normal cervical length, ?dynamic cervix? Reports she has been taking Prometrium Had follow up US scheduled for 05/17/21 but did not attend Rescheduled for 06/27/21  6. Red Chart Rounds Patient   7. Prolonged Q-T interval on ECG Not an issue of late, only in the setting of multiple QT prolonging meds  8. Anxiety with depression Has connected with [facility] services and they have assumed responsibility for Xanax prescription Has follow up visit scheduled for 06/21/21, she is working on getting it rescheduled  Preterm labor symptoms and general obstetric precautions including but not limited to vaginal bleeding, contractions, leaking of fluid and fetal movement were reviewed in detail with the patient. Please refer to After  Visit Summary for other counseling recommendations.  Return in 2 weeks (on 06/27/2021) for [facility], ob visit, needs MD.   Venora Maples, MD

## 2021-06-13 NOTE — Patient Instructions (Signed)

## 2021-06-13 NOTE — Progress Notes (Signed)
High PHQ-9 and GAD7 scores, is currently connected with [facility]\  Tran Randle, CMA

## 2021-06-13 NOTE — Telephone Encounter (Signed)
Called pt; unable to leave VM at mobile/home number due to box full, home phone cannot be completed as dialed. Called husband, pt given the cell phone. Pt states she is planning to come to appt today at 9:30 AM. States she has not eaten or had anything to drink following midnight. States she plans to go pick up medicine and then come to office.

## 2021-06-14 LAB — CBC
Hematocrit: 22.4 % — ABNORMAL LOW (ref 34.0–46.6)
Hemoglobin: 6.7 g/dL — CL (ref 11.1–15.9)
MCH: 21.9 pg — ABNORMAL LOW (ref 26.6–33.0)
MCHC: 29.9 g/dL — ABNORMAL LOW (ref 31.5–35.7)
MCV: 73 fL — ABNORMAL LOW (ref 79–97)
Platelets: 200 10*3/uL (ref 150–450)
RBC: 3.06 x10E6/uL — ABNORMAL LOW (ref 3.77–5.28)
RDW: 17.1 % — ABNORMAL HIGH (ref 11.7–15.4)
WBC: 6.3 10*3/uL (ref 3.4–10.8)

## 2021-06-14 LAB — COMPREHENSIVE METABOLIC PANEL
ALT: 7 IU/L (ref 0–32)
AST: 16 IU/L (ref 0–40)
Albumin/Globulin Ratio: 1.2 (ref 1.2–2.2)
Albumin: 3.6 g/dL — ABNORMAL LOW (ref 3.9–5.0)
Alkaline Phosphatase: 133 IU/L — ABNORMAL HIGH (ref 44–121)
BUN/Creatinine Ratio: 12 (ref 9–23)
BUN: 7 mg/dL (ref 6–20)
Bilirubin Total: 0.3 mg/dL (ref 0.0–1.2)
CO2: 23 mmol/L (ref 20–29)
Calcium: 8.8 mg/dL (ref 8.7–10.2)
Chloride: 98 mmol/L (ref 96–106)
Creatinine, Ser: 0.6 mg/dL (ref 0.57–1.00)
Globulin, Total: 3.1 g/dL (ref 1.5–4.5)
Glucose: 76 mg/dL (ref 70–99)
Potassium: 3.8 mmol/L (ref 3.5–5.2)
Sodium: 134 mmol/L (ref 134–144)
Total Protein: 6.7 g/dL (ref 6.0–8.5)
eGFR: 127 mL/min/{1.73_m2} (ref >59)

## 2021-06-14 LAB — GLUCOSE TOLERANCE, 2 HOURS W/ 1HR
Glucose, 1 hour: 105 mg/dL (ref 70–179)
Glucose, 2 hour: 73 mg/dL (ref 70–152)
Glucose, Fasting: 74 mg/dL (ref 70–91)

## 2021-06-14 NOTE — Telephone Encounter (Signed)
Attempted to call patient and let her know that her hemoglobin is again critically low at 6.7 which is a known issue.  Given her CHF she would benefit from a blood transfusion, we could also perform an echo while she is here.  Clinical staff please continue to outreach to patient, if you are able to reach her and she is amenable she can either present to MAU (I am here today 06/14/21 until 2200) or call me on Vocera to coordinate or call [facility] second attending to coordinate.

## 2021-06-15 ENCOUNTER — Telehealth: Payer: Self-pay

## 2021-06-15 ENCOUNTER — Ambulatory Visit: Payer: Medicaid Other

## 2021-06-15 NOTE — Telephone Encounter (Signed)
Call placed to pt. Spoke with pt. Pt given results and recommendations per Dr Crissie Reese on low Hgb and to go to MAU for blood transfusion! Pt agreeable to plan of care and will go to MAU today or tomorrow.  Dr Crissie Reese aware.  Judeth Cornfield, RN   Venora Maples, MD 21 hours ago (1:15 PM)   Attempted to call patient and let her know that her hemoglobin is again critically low at 6.7 which is a known issue.   Given her CHF she would benefit from a blood transfusion, we could also perform an echo while she is here.   Clinical staff please continue to outreach to patient, if you are able to reach her and she is amenable she can either present to MAU (I am here today 06/14/21 until 2200) or call me on Vocera to coordinate or call [facility] second attending to coordinate.

## 2021-06-20 ENCOUNTER — Telehealth (HOSPITAL_COMMUNITY): Payer: Self-pay | Admitting: *Deleted

## 2021-06-20 ENCOUNTER — Encounter (HOSPITAL_COMMUNITY): Payer: Medicaid Other | Admitting: Psychiatry

## 2021-06-20 NOTE — Telephone Encounter (Signed)
Pt called from [facility] for bridge on sertraline (ZOLOFT) 100 MG tablet  and Xanax (ordered by another provider)  Pt was recommended restarting therapy with counselor at [facility] for PTSD and f/u in 2 wks--which neither is done.

## 2021-06-21 ENCOUNTER — Encounter: Payer: Self-pay | Admitting: *Deleted

## 2021-06-21 ENCOUNTER — Telehealth (HOSPITAL_COMMUNITY): Payer: Self-pay | Admitting: *Deleted

## 2021-06-21 ENCOUNTER — Encounter (HOSPITAL_COMMUNITY): Payer: Medicaid Other | Admitting: Psychiatry

## 2021-06-21 LAB — TOXASSURE SELECT 13 (MW), URINE

## 2021-06-21 NOTE — Telephone Encounter (Signed)
Attempted call to patient after Speaking with Dr Lucianne Muss. Patient has accumulated many no shows due to provider schedule. But unfortunately we are unable to bridge her medication while patient is down in [facility]

## 2021-06-22 ENCOUNTER — Telehealth (HOSPITAL_COMMUNITY): Payer: Self-pay | Admitting: *Deleted

## 2021-06-22 NOTE — Telephone Encounter (Signed)
Attempted Call to patient Phone Rings Numerous Times & then Recording Says:: Mail Box is Full Unable to Leave any Voice Messages @ this Time.Marland Kitchen   Purpose of call was to inform patient that she can call her Rx [facility]  & request then to  move refill to location near her.  Patient has ordered :   sertraline (ZOLOFT) 100 MG tablet 30 tablet 2 Refills

## 2021-06-25 ENCOUNTER — Encounter (HOSPITAL_COMMUNITY): Payer: Self-pay | Admitting: Obstetrics & Gynecology

## 2021-06-25 ENCOUNTER — Observation Stay (HOSPITAL_COMMUNITY)
Admission: AD | Admit: 2021-06-25 | Discharge: 2021-06-26 | Disposition: A | Discharge status: Home / Self Care | Payer: Medicaid Other | Attending: Family Medicine | Admitting: Family Medicine

## 2021-06-25 ENCOUNTER — Other Ambulatory Visit: Payer: Self-pay

## 2021-06-25 DIAGNOSIS — Z20822 Contact with and (suspected) exposure to covid-19: Secondary | ICD-10-CM | POA: Insufficient documentation

## 2021-06-25 DIAGNOSIS — O0993 Supervision of high risk pregnancy, unspecified, third trimester: Secondary | ICD-10-CM | POA: Diagnosis not present

## 2021-06-25 DIAGNOSIS — F1193 Opioid use, unspecified with withdrawal: Secondary | ICD-10-CM | POA: Diagnosis not present

## 2021-06-25 DIAGNOSIS — I4581 Long QT syndrome: Secondary | ICD-10-CM | POA: Diagnosis not present

## 2021-06-25 DIAGNOSIS — O219 Vomiting of pregnancy, unspecified: Secondary | ICD-10-CM | POA: Insufficient documentation

## 2021-06-25 DIAGNOSIS — D509 Iron deficiency anemia, unspecified: Secondary | ICD-10-CM | POA: Diagnosis not present

## 2021-06-25 DIAGNOSIS — Z3A33 33 weeks gestation of pregnancy: Secondary | ICD-10-CM | POA: Diagnosis not present

## 2021-06-25 DIAGNOSIS — O99323 Drug use complicating pregnancy, third trimester: Principal | ICD-10-CM | POA: Insufficient documentation

## 2021-06-25 DIAGNOSIS — O99019 Anemia complicating pregnancy, unspecified trimester: Secondary | ICD-10-CM | POA: Diagnosis not present

## 2021-06-25 DIAGNOSIS — I5181 Takotsubo syndrome: Secondary | ICD-10-CM | POA: Diagnosis not present

## 2021-06-25 DIAGNOSIS — R9431 Abnormal electrocardiogram [ECG] [EKG]: Secondary | ICD-10-CM | POA: Diagnosis not present

## 2021-06-25 DIAGNOSIS — F1190 Opioid use, unspecified, uncomplicated: Secondary | ICD-10-CM | POA: Diagnosis not present

## 2021-06-25 DIAGNOSIS — O99012 Anemia complicating pregnancy, second trimester: Secondary | ICD-10-CM

## 2021-06-25 DIAGNOSIS — O26893 Other specified pregnancy related conditions, third trimester: Secondary | ICD-10-CM | POA: Insufficient documentation

## 2021-06-25 DIAGNOSIS — O0990 Supervision of high risk pregnancy, unspecified, unspecified trimester: Secondary | ICD-10-CM

## 2021-06-25 LAB — RESP PANEL BY RT-PCR (FLU A&B, COVID) ARPGX2
Influenza A by PCR: NEGATIVE
Influenza B by PCR: NEGATIVE
SARS Coronavirus 2 by RT PCR: NEGATIVE

## 2021-06-25 LAB — CBC
HCT: 23.8 % — ABNORMAL LOW (ref 36.0–46.0)
Hemoglobin: 6.5 g/dL — CL (ref 12.0–15.0)
MCH: 21 pg — ABNORMAL LOW (ref 26.0–34.0)
MCHC: 27.3 g/dL — ABNORMAL LOW (ref 30.0–36.0)
MCV: 76.8 fL — ABNORMAL LOW (ref 80.0–100.0)
Platelets: 233 10*3/uL (ref 150–400)
RBC: 3.1 MIL/uL — ABNORMAL LOW (ref 3.87–5.11)
RDW: 17.2 % — ABNORMAL HIGH (ref 11.5–15.5)
WBC: 5.6 10*3/uL (ref 4.0–10.5)
nRBC: 0.4 % — ABNORMAL HIGH (ref 0.0–0.2)

## 2021-06-25 LAB — URINALYSIS, ROUTINE W REFLEX MICROSCOPIC
Bacteria, UA: NONE SEEN
Bilirubin Urine: NEGATIVE
Glucose, UA: NEGATIVE mg/dL
Hgb urine dipstick: NEGATIVE
Ketones, ur: 80 mg/dL — AB
Nitrite: NEGATIVE
Protein, ur: NEGATIVE mg/dL
Specific Gravity, Urine: 1.018 (ref 1.005–1.030)
pH: 6 (ref 5.0–8.0)

## 2021-06-25 LAB — COMPREHENSIVE METABOLIC PANEL
ALT: 11 U/L (ref 0–44)
AST: 16 U/L (ref 15–41)
Albumin: 2.5 g/dL — ABNORMAL LOW (ref 3.5–5.0)
Alkaline Phosphatase: 131 U/L — ABNORMAL HIGH (ref 38–126)
Anion gap: 10 (ref 5–15)
BUN: 9 mg/dL (ref 6–20)
CO2: 21 mmol/L — ABNORMAL LOW (ref 22–32)
Calcium: 8.5 mg/dL — ABNORMAL LOW (ref 8.9–10.3)
Chloride: 100 mmol/L (ref 98–111)
Creatinine, Ser: 0.63 mg/dL (ref 0.44–1.00)
GFR, Estimated: >60 mL/min (ref >60)
Glucose, Bld: 82 mg/dL (ref 70–99)
Potassium: 3.8 mmol/L (ref 3.5–5.1)
Sodium: 131 mmol/L — ABNORMAL LOW (ref 135–145)
Total Bilirubin: 0.7 mg/dL (ref 0.3–1.2)
Total Protein: 6.1 g/dL — ABNORMAL LOW (ref 6.5–8.1)

## 2021-06-25 LAB — PREPARE RBC (CROSSMATCH)

## 2021-06-25 MED ORDER — ASPIRIN EC 81 MG PO TBEC
81.0000 mg | DELAYED_RELEASE_TABLET | Freq: Every day | ORAL | Status: DC
  Administered 2021-06-25 – 2021-06-26 (×2): 81 mg via ORAL
  Filled 2021-06-25 (×2): qty 1

## 2021-06-25 MED ORDER — PROGESTERONE 200 MG PO CAPS
200.0000 mg | ORAL_CAPSULE | Freq: Every day | ORAL | Status: DC
  Filled 2021-06-25: qty 1

## 2021-06-25 MED ORDER — LACTATED RINGERS IV BOLUS
1000.0000 mL | Freq: Once | INTRAVENOUS | Status: AC
  Administered 2021-06-25: 1000 mL via INTRAVENOUS

## 2021-06-25 MED ORDER — METHADONE HCL 10 MG/ML PO CONC
170.0000 mg | Freq: Every day | ORAL | Status: DC
Start: 2021-06-25 — End: 2021-06-26
  Administered 2021-06-25: 170 mg via ORAL
  Filled 2021-06-25 (×3): qty 20

## 2021-06-25 MED ORDER — ACETAMINOPHEN 325 MG PO TABS
650.0000 mg | ORAL_TABLET | ORAL | Status: DC | PRN
  Filled 2021-06-25: qty 2

## 2021-06-25 MED ORDER — ONDANSETRON 4 MG PO TBDP
8.0000 mg | ORAL_TABLET | Freq: Three times a day (TID) | ORAL | Status: DC | PRN
  Filled 2021-06-25: qty 2

## 2021-06-25 MED ORDER — PANTOPRAZOLE SODIUM 40 MG IV SOLR
40.0000 mg | Freq: Once | INTRAVENOUS | Status: AC
  Administered 2021-06-25: 40 mg via INTRAVENOUS
  Filled 2021-06-25: qty 40

## 2021-06-25 MED ORDER — ALPRAZOLAM 0.5 MG PO TABS
1.0000 mg | ORAL_TABLET | Freq: Every evening | ORAL | Status: DC | PRN
  Administered 2021-06-25 – 2021-06-26 (×2): 1 mg via ORAL
  Filled 2021-06-25 (×2): qty 2

## 2021-06-25 MED ORDER — METOPROLOL SUCCINATE 12.5 MG HALF TABLET
12.5000 mg | ORAL_TABLET | Freq: Two times a day (BID) | ORAL | Status: DC
  Administered 2021-06-26: 12.5 mg via ORAL
  Filled 2021-06-25 (×3): qty 1

## 2021-06-25 MED ORDER — ONDANSETRON HCL 4 MG/2ML IJ SOLN
4.0000 mg | Freq: Once | INTRAMUSCULAR | Status: AC
  Administered 2021-06-25: 4 mg via INTRAVENOUS
  Filled 2021-06-25: qty 2

## 2021-06-25 MED ORDER — CALCIUM CARBONATE ANTACID 500 MG PO CHEW: 2.0000 | CHEWABLE_TABLET | ORAL | Status: DC | PRN

## 2021-06-25 MED ORDER — SODIUM CHLORIDE 0.9 % IV SOLN
25.0000 mg | Freq: Once | INTRAVENOUS | Status: AC
  Administered 2021-06-25: 25 mg via INTRAVENOUS
  Filled 2021-06-25: qty 1

## 2021-06-25 MED ORDER — ZOLPIDEM TARTRATE 5 MG PO TABS: 5.0000 mg | ORAL_TABLET | Freq: Every evening | ORAL | Status: DC | PRN

## 2021-06-25 MED ORDER — PRENATAL ADULT GUMMY/DHA/FA 0.4-25 MG PO CHEW: 1.0000 | CHEWABLE_TABLET | Freq: Every day | ORAL | Status: DC

## 2021-06-25 MED ORDER — DOCUSATE SODIUM 100 MG PO CAPS: 100.0000 mg | ORAL_CAPSULE | Freq: Every day | ORAL | Status: DC

## 2021-06-25 MED ORDER — LACTATED RINGERS IV SOLN: INTRAVENOUS | Status: DC

## 2021-06-25 MED ORDER — PRENATAL MULTIVITAMIN CH: 1.0000 | ORAL_TABLET | Freq: Every day | ORAL | Status: DC

## 2021-06-25 MED ORDER — SODIUM CHLORIDE 0.9% IV SOLUTION: Freq: Once | INTRAVENOUS | Status: DC
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
## 2021-06-25 NOTE — Progress Notes (Signed)
CRITICAL VALUE STICKER  CRITICAL VALUE: 6.5 Hgb  RECEIVER (on-site recipient of call): Zenia Resides, RN   DATE & TIME NOTIFIED: 06/25/2021 1814  MESSENGER (representative from lab): Luiz Blare  MD NOTIFIED: Dr. Adrian Blackwater   TIME OF NOTIFICATION: 06/25/2021 1815  RESPONSE:  no new orders at this time

## 2021-06-25 NOTE — H&P (Signed)
Patient Name: Gabrielle Nguyen, female   DOB: 1995-11-24, 25 y.o.  MRN: [id number]   This is a 25yo [id number] at [redacted]w[redacted]d with pregnancy complicated by takotsubo cardiomyopathy, OUD, HFrEF at 20-25%. She presents with increased nausea, vomiting, body aches. She missed her dose of methadone this morning. Symptoms worsening.   I have reviewed the patients past medical, family, and social history.  I have reviewed the patient's medication list and allergies.  BP 120/72 (BP Location: Right Arm)   Pulse (!) 114   Temp 99.2 F (37.3 C) (Axillary)   Resp 18   LMP  (LMP Unknown)   SpO2 100%  A&Ox3, ill appearing. Pale conjunctiva CTA no wheezing RR, normal S1/S2. Mild tenderness, no rebound/guarding.  No edema. 2+ pulses.  Results for orders placed or performed during the hospital encounter of 06/25/21 (from the past 24 hour(s))  Comprehensive metabolic panel     Status: Abnormal   Collection Time: 06/25/21  5:00 PM  Result Value Ref Range   Sodium 131 (L) 135 - 145 mmol/L   Potassium 3.8 3.5 - 5.1 mmol/L   Chloride 100 98 - 111 mmol/L   CO2 21 (L) 22 - 32 mmol/L   Glucose, Bld 82 70 - 99 mg/dL   BUN 9 6 - 20 mg/dL   Creatinine, Ser 7.61 0.44 - 1.00 mg/dL   Calcium 8.5 (L) 8.9 - 10.3 mg/dL   Total Protein 6.1 (L) 6.5 - 8.1 g/dL   Albumin 2.5 (L) 3.5 - 5.0 g/dL   AST 16 15 - 41 U/L   ALT 11 0 - 44 U/L   Alkaline Phosphatase 131 (H) 38 - 126 U/L   Total Bilirubin 0.7 0.3 - 1.2 mg/dL   GFR, Estimated >60 >73 mL/min   Anion gap 10 5 - 15  CBC     Status: Abnormal   Collection Time: 06/25/21  5:00 PM  Result Value Ref Range   WBC 5.6 4.0 - 10.5 K/uL   RBC 3.10 (L) 3.87 - 5.11 MIL/uL   Hemoglobin 6.5 (LL) 12.0 - 15.0 g/dL   HCT 71.0 (L) 62.6 - 94.8 %   MCV 76.8 (L) 80.0 - 100.0 fL   MCH 21.0 (L) 26.0 - 34.0 pg   MCHC 27.3 (L) 30.0 - 36.0 g/dL   RDW 54.6 (H) 27.0 - 35.0 %   Platelets 233 150 - 400 K/uL   nRBC 0.4 (H) 0.0 - 0.2 %   NST:  Baseline: 150-160, some episodes in the  180 Variability: moderate Accelerations: ++  Decelerations: none Contractions:   1. Supervision of high risk pregnancy, antepartum   2. Iron deficiency anemia, unspecified iron deficiency anemia type   3. Prolonged Q-T interval on ECG   4. Opioid use disorder   5. Takotsubo cardiomyopathy    Will admit. Transfuse 2 unites PRBCs - especially as patient has cardiomyopathy and is pregnant. Restart methadone - give dose tonight Recheck labs in AM   Levie Heritage, DO [facility] [employer] 06/25/2021 7:26 PM
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
## 2021-06-26 ENCOUNTER — Telehealth (HOSPITAL_COMMUNITY): Payer: Self-pay | Admitting: *Deleted

## 2021-06-26 DIAGNOSIS — O99013 Anemia complicating pregnancy, third trimester: Secondary | ICD-10-CM | POA: Diagnosis not present

## 2021-06-26 DIAGNOSIS — O99012 Anemia complicating pregnancy, second trimester: Secondary | ICD-10-CM

## 2021-06-26 DIAGNOSIS — Z3A33 33 weeks gestation of pregnancy: Secondary | ICD-10-CM | POA: Diagnosis not present

## 2021-06-26 DIAGNOSIS — O99019 Anemia complicating pregnancy, unspecified trimester: Secondary | ICD-10-CM

## 2021-06-26 DIAGNOSIS — F1193 Opioid use, unspecified with withdrawal: Secondary | ICD-10-CM | POA: Diagnosis not present

## 2021-06-26 LAB — TYPE AND SCREEN
ABO/RH(D): A POS
Antibody Screen: NEGATIVE
Unit division: 0
Unit division: 0

## 2021-06-26 LAB — BPAM RBC
Blood Product Expiration Date: 202211052359
Blood Product Expiration Date: 202211052359
ISSUE DATE / TIME: 202210242049
ISSUE DATE / TIME: 202210242332
Unit Type and Rh: 6200
Unit Type and Rh: 6200

## 2021-06-26 LAB — HEMOGLOBIN AND HEMATOCRIT, BLOOD
HCT: 26.7 % — ABNORMAL LOW (ref 36.0–46.0)
Hemoglobin: 8 g/dL — ABNORMAL LOW (ref 12.0–15.0)

## 2021-06-26 MED ORDER — SODIUM CHLORIDE 0.9 % IV SOLN
25.0000 mg | Freq: Once | INTRAVENOUS | Status: AC
  Administered 2021-06-26: 25 mg via INTRAVENOUS
  Filled 2021-06-26: qty 1

## 2021-06-26 MED ORDER — SCOPOLAMINE 1 MG/3DAYS TD PT72: 1.0000 | MEDICATED_PATCH | TRANSDERMAL | 12 refills | Status: DC

## 2021-06-26 MED ORDER — GLYCOPYRROLATE 1 MG PO TABS: 2.0000 mg | ORAL_TABLET | Freq: Three times a day (TID) | ORAL | Status: DC | PRN

## 2021-06-26 MED ORDER — SODIUM CHLORIDE 0.9 % IV SOLN: Freq: Every day | INTRAVENOUS | Status: DC

## 2021-06-26 MED ORDER — METHADONE HCL 10 MG/ML IJ SOLN: Freq: Every day | INTRAMUSCULAR | Status: DC

## 2021-06-26 MED ORDER — PROCHLORPERAZINE MALEATE 10 MG PO TABS
10.0000 mg | ORAL_TABLET | Freq: Four times a day (QID) | ORAL | Status: DC | PRN
  Filled 2021-06-26: qty 1

## 2021-06-26 MED ORDER — METHADONE HCL 10 MG/ML IJ SOLN
80.0000 mg | Freq: Every day | INTRAMUSCULAR | Status: DC
  Administered 2021-06-26: 80 mg via INTRAVENOUS
  Filled 2021-06-26 (×2): qty 8

## 2021-06-26 MED ORDER — ALPRAZOLAM 1 MG PO TABS: 1.0000 mg | ORAL_TABLET | Freq: Two times a day (BID) | ORAL | 0 refills | Status: DC

## 2021-06-26 MED ORDER — GLYCOPYRROLATE 0.2 MG/ML IJ SOLN
0.2000 mg | Freq: Three times a day (TID) | INTRAMUSCULAR | Status: DC | PRN
  Administered 2021-06-26: 0.2 mg via INTRAVENOUS
  Filled 2021-06-26: qty 1

## 2021-06-26 NOTE — Discharge Summary (Signed)
Physician Discharge Summary  Patient ID: Gabrielle Nguyen MRN: [id number] DOB/AGE: 10/20/1995 24 y.o.  Admit date: 06/25/2021 Discharge date: 06/26/2021  Admission Diagnoses: Opioid withdrawal syndrome Anemia, symptomatic [redacted]w[redacted]d  Takotsubo cardiomyopathy Discharge Diagnoses:  Active Problems:   Anemia in pregnancy SAA  Discharged Condition: stable  Hospital Course: managed her nausea vomiting related to alprazolam and methadone withdrawal, dosed her methadone IV and transfused 2 units PRBC  Pt felt "1000" times better at discharge  Fetal surveillance reassuring, tachycardia until received her IV methadone dosing  Consults: None  Significant Diagnostic Studies: labs:  CBC Latest Ref Rng & Units 06/26/2021 06/25/2021 06/13/2021  WBC 4.0 - 10.5 K/uL - 5.6 6.3  Hemoglobin 12.0 - 15.0 g/dL 8.0(L) 6.5(LL) 6.7(LL)  Hematocrit 36.0 - 46.0 % 26.7(L) 23.8(L) 22.4(L)  Platelets 150 - 400 K/uL - 233 200     Treatments: transfusion, nausea control, IV methadone, supportive care  Discharge Exam: Blood pressure 140/90, pulse (!) 147, temperature 98.2 F (36.8 C), temperature source Axillary, resp. rate 16, height 5' 7.01" (1.702 m), weight 54.7 kg, SpO2 100 %, unknown if currently breastfeeding. General appearance: alert, cooperative, and no distress GI: soft, non-tender; bowel sounds normal; no masses,  no organomegaly  Disposition: Discharge disposition: 01-Home or Self Care       Discharge Instructions     Diet - low sodium heart healthy   Complete by: As directed    Increase activity slowly   Complete by: As directed         Follow-up Information     Venora Maples, MD Follow up in 1 week(s).   Specialty: Family Medicine Why: routine OB visit Contact information: [address] [place] [address] [phone]                 Signed: Lazaro Arms 06/26/2021, 3:02 PM

## 2021-06-26 NOTE — Telephone Encounter (Signed)
Opened in error

## 2021-06-26 NOTE — Progress Notes (Addendum)
Discharge instructions and prescriptions given to pt. Discussed signs and symptoms to report to the MD, upcoming appointments, and meds. Pt verbalizes understanding and has no questions at this time. Pt discharged home from hospital in stable condition.  

## 2021-06-27 ENCOUNTER — Other Ambulatory Visit: Payer: Self-pay | Admitting: Maternal & Fetal Medicine

## 2021-06-27 ENCOUNTER — Ambulatory Visit: Payer: Medicaid Other | Admitting: *Deleted

## 2021-06-27 ENCOUNTER — Encounter: Payer: Self-pay | Admitting: *Deleted

## 2021-06-27 ENCOUNTER — Other Ambulatory Visit: Payer: Self-pay

## 2021-06-27 ENCOUNTER — Ambulatory Visit: Payer: Medicaid Other | Attending: Maternal & Fetal Medicine | Admitting: *Deleted

## 2021-06-27 ENCOUNTER — Ambulatory Visit (HOSPITAL_BASED_OUTPATIENT_CLINIC_OR_DEPARTMENT_OTHER): Payer: Medicaid Other

## 2021-06-27 VITALS — BP 94/53 | HR 60

## 2021-06-27 DIAGNOSIS — Z3A33 33 weeks gestation of pregnancy: Secondary | ICD-10-CM | POA: Insufficient documentation

## 2021-06-27 DIAGNOSIS — O99323 Drug use complicating pregnancy, third trimester: Secondary | ICD-10-CM | POA: Insufficient documentation

## 2021-06-27 DIAGNOSIS — O0933 Supervision of pregnancy with insufficient antenatal care, third trimester: Secondary | ICD-10-CM | POA: Diagnosis not present

## 2021-06-27 DIAGNOSIS — D649 Anemia, unspecified: Secondary | ICD-10-CM | POA: Insufficient documentation

## 2021-06-27 DIAGNOSIS — O99013 Anemia complicating pregnancy, third trimester: Secondary | ICD-10-CM | POA: Diagnosis not present

## 2021-06-27 DIAGNOSIS — J45909 Unspecified asthma, uncomplicated: Secondary | ICD-10-CM | POA: Diagnosis not present

## 2021-06-27 DIAGNOSIS — F1990 Other psychoactive substance use, unspecified, uncomplicated: Secondary | ICD-10-CM

## 2021-06-27 DIAGNOSIS — O99413 Diseases of the circulatory system complicating pregnancy, third trimester: Secondary | ICD-10-CM | POA: Insufficient documentation

## 2021-06-27 DIAGNOSIS — Z362 Encounter for other antenatal screening follow-up: Secondary | ICD-10-CM

## 2021-06-27 DIAGNOSIS — F1920 Other psychoactive substance dependence, uncomplicated: Secondary | ICD-10-CM | POA: Insufficient documentation

## 2021-06-27 DIAGNOSIS — O0990 Supervision of high risk pregnancy, unspecified, unspecified trimester: Secondary | ICD-10-CM

## 2021-06-27 DIAGNOSIS — O365930 Maternal care for other known or suspected poor fetal growth, third trimester, not applicable or unspecified: Secondary | ICD-10-CM | POA: Insufficient documentation

## 2021-06-27 DIAGNOSIS — O99513 Diseases of the respiratory system complicating pregnancy, third trimester: Secondary | ICD-10-CM | POA: Insufficient documentation

## 2021-06-27 NOTE — Procedures (Signed)
Gabrielle Nguyen Feb 15, 1996 [redacted]w[redacted]d  Fetus A Non-Stress Test Interpretation for 06/27/21  Indication: Unsatisfactory BPP, IUGR  Fetal Heart Rate A Mode: External Baseline Rate (A): 140 bpm Variability: Moderate Accelerations: 15 x 15 Decelerations: None Multiple birth?: No  Uterine Activity Mode: Palpation, Toco Contraction Frequency (min): 2 uc's Contraction Duration (sec): 70-80 Contraction Quality: Mild Resting Tone Palpated: Relaxed Resting Time: Adequate  Interpretation (Fetal Testing) Nonstress Test Interpretation: Reactive Overall Impression: Reassuring for gestational age Comments: Dr. Parke Poisson reviewed tracing

## 2021-06-28 ENCOUNTER — Other Ambulatory Visit: Payer: Self-pay | Admitting: *Deleted

## 2021-06-28 DIAGNOSIS — O365930 Maternal care for other known or suspected poor fetal growth, third trimester, not applicable or unspecified: Secondary | ICD-10-CM

## 2021-06-28 DIAGNOSIS — O99323 Drug use complicating pregnancy, third trimester: Secondary | ICD-10-CM

## 2021-06-28 NOTE — Progress Notes (Unsigned)
u

## 2021-07-03 HISTORY — PX: WISDOM TOOTH EXTRACTION: SHX21

## 2021-07-04 ENCOUNTER — Ambulatory Visit: Payer: Medicaid Other

## 2021-07-04 ENCOUNTER — Ambulatory Visit: Payer: Medicaid Other | Attending: Obstetrics

## 2021-07-04 ENCOUNTER — Encounter: Payer: Medicaid Other | Admitting: Family Medicine

## 2021-07-05 ENCOUNTER — Encounter: Payer: Self-pay | Admitting: Family Medicine

## 2021-07-05 DIAGNOSIS — O365990 Maternal care for other known or suspected poor fetal growth, unspecified trimester, not applicable or unspecified: Secondary | ICD-10-CM | POA: Insufficient documentation

## 2021-07-11 ENCOUNTER — Ambulatory Visit: Payer: Medicaid Other

## 2021-07-11 ENCOUNTER — Ambulatory Visit: Payer: Medicaid Other | Attending: Obstetrics

## 2021-07-15 ENCOUNTER — Inpatient Hospital Stay (HOSPITAL_COMMUNITY)
Admission: AD | Admit: 2021-07-15 | Discharge: 2021-07-15 | Disposition: A | Discharge status: Home / Self Care | Payer: Medicaid Other | Attending: Family Medicine | Admitting: Family Medicine

## 2021-07-15 ENCOUNTER — Other Ambulatory Visit: Payer: Self-pay

## 2021-07-15 ENCOUNTER — Encounter (HOSPITAL_COMMUNITY): Payer: Self-pay | Admitting: Family Medicine

## 2021-07-15 ENCOUNTER — Inpatient Hospital Stay (HOSPITAL_BASED_OUTPATIENT_CLINIC_OR_DEPARTMENT_OTHER): Payer: Medicaid Other

## 2021-07-15 DIAGNOSIS — Z3A36 36 weeks gestation of pregnancy: Secondary | ICD-10-CM | POA: Diagnosis not present

## 2021-07-15 DIAGNOSIS — F1490 Cocaine use, unspecified, uncomplicated: Secondary | ICD-10-CM | POA: Insufficient documentation

## 2021-07-15 DIAGNOSIS — F1120 Opioid dependence, uncomplicated: Secondary | ICD-10-CM | POA: Diagnosis not present

## 2021-07-15 DIAGNOSIS — I429 Cardiomyopathy, unspecified: Secondary | ICD-10-CM | POA: Insufficient documentation

## 2021-07-15 DIAGNOSIS — O0933 Supervision of pregnancy with insufficient antenatal care, third trimester: Secondary | ICD-10-CM | POA: Diagnosis not present

## 2021-07-15 DIAGNOSIS — O99413 Diseases of the circulatory system complicating pregnancy, third trimester: Secondary | ICD-10-CM | POA: Insufficient documentation

## 2021-07-15 DIAGNOSIS — I428 Other cardiomyopathies: Secondary | ICD-10-CM | POA: Diagnosis not present

## 2021-07-15 DIAGNOSIS — O99323 Drug use complicating pregnancy, third trimester: Secondary | ICD-10-CM | POA: Diagnosis not present

## 2021-07-15 DIAGNOSIS — R110 Nausea: Secondary | ICD-10-CM | POA: Diagnosis not present

## 2021-07-15 DIAGNOSIS — O26893 Other specified pregnancy related conditions, third trimester: Secondary | ICD-10-CM | POA: Diagnosis present

## 2021-07-15 LAB — CBC
HCT: 27 % — ABNORMAL LOW (ref 36.0–46.0)
Hemoglobin: 7.9 g/dL — ABNORMAL LOW (ref 12.0–15.0)
MCH: 23.2 pg — ABNORMAL LOW (ref 26.0–34.0)
MCHC: 29.3 g/dL — ABNORMAL LOW (ref 30.0–36.0)
MCV: 79.4 fL — ABNORMAL LOW (ref 80.0–100.0)
Platelets: 173 10*3/uL (ref 150–400)
RBC: 3.4 MIL/uL — ABNORMAL LOW (ref 3.87–5.11)
RDW: 19 % — ABNORMAL HIGH (ref 11.5–15.5)
WBC: 6.4 10*3/uL (ref 4.0–10.5)
nRBC: 0 % (ref 0.0–0.2)

## 2021-07-15 LAB — RAPID URINE DRUG SCREEN, HOSP PERFORMED
Amphetamines: NOT DETECTED
Barbiturates: NOT DETECTED
Benzodiazepines: POSITIVE — AB
Cocaine: POSITIVE — AB
Opiates: POSITIVE — AB
Tetrahydrocannabinol: NOT DETECTED

## 2021-07-15 LAB — ECHOCARDIOGRAM COMPLETE
AR max vel: 2.2 cm2
AV Area VTI: 2.05 cm2
AV Area mean vel: 2.14 cm2
AV Mean grad: 6 mmHg
AV Peak grad: 10.2 mmHg
Ao pk vel: 1.6 m/s
Area-P 1/2: 4.63 cm2
S' Lateral: 3.6 cm

## 2021-07-15 LAB — URINALYSIS, ROUTINE W REFLEX MICROSCOPIC
Bilirubin Urine: NEGATIVE
Glucose, UA: NEGATIVE mg/dL
Hgb urine dipstick: NEGATIVE
Ketones, ur: 20 mg/dL — AB
Nitrite: NEGATIVE
Protein, ur: 30 mg/dL — AB
Specific Gravity, Urine: 1.034 — ABNORMAL HIGH (ref 1.005–1.030)
pH: 5 (ref 5.0–8.0)

## 2021-07-15 LAB — TYPE AND SCREEN
ABO/RH(D): A POS
Antibody Screen: NEGATIVE

## 2021-07-15 MED ORDER — METHADONE HCL 10 MG/ML PO CONC
175.0000 mg | Freq: Once | ORAL | Status: AC
  Administered 2021-07-15: 175 mg via ORAL
  Filled 2021-07-15: qty 20

## 2021-07-15 MED ORDER — ACETAMINOPHEN 500 MG PO TABS: 1000.0000 mg | ORAL_TABLET | Freq: Four times a day (QID) | ORAL | 0 refills | Status: DC | PRN

## 2021-07-15 MED ORDER — ACETAMINOPHEN 500 MG PO TABS
1000.0000 mg | ORAL_TABLET | Freq: Once | ORAL | Status: AC
  Administered 2021-07-15: 1000 mg via ORAL
  Filled 2021-07-15: qty 2

## 2021-07-15 MED ORDER — LACTATED RINGERS IV BOLUS
1000.0000 mL | Freq: Once | INTRAVENOUS | Status: AC
  Administered 2021-07-15: 1000 mL via INTRAVENOUS

## 2021-07-15 MED ORDER — ALPRAZOLAM 0.5 MG PO TABS
1.0000 mg | ORAL_TABLET | Freq: Once | ORAL | Status: AC
  Administered 2021-07-15: 1 mg via ORAL
  Filled 2021-07-15: qty 2

## 2021-07-15 NOTE — Progress Notes (Signed)
  Echocardiogram 2D Echocardiogram has been performed.  Roosvelt Maser F 07/15/2021, 2:58 PM

## 2021-07-15 NOTE — MAU Note (Signed)
Patient presenting with new onset lethargy while RN in room. Patient unable to keep her eyes open long without verbal cues. RN asked patient if she used any substances since being at the hospital and patient very adamant stating she did not.  Gabrielle Nguyen, CNM, and Tinnie Gens, MD at bedside.
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
## 2021-07-15 NOTE — MAU Note (Signed)
Patient alert and oriented x4. Patient talking to RN about a sale with online shopping and sharing stories about her children.

## 2021-07-15 NOTE — Discharge Instructions (Signed)
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
## 2021-07-15 NOTE — MAU Note (Signed)
Called Echo Lab to get an estimate of when they would come and perform the patients Echocardiogram that was ordered a t 1107. They stated they would sent for transport for the patient now.

## 2021-07-15 NOTE — MAU Note (Signed)
Transport arrived to escort patient to [facility].

## 2021-07-18 ENCOUNTER — Telehealth: Payer: Self-pay

## 2021-07-18 ENCOUNTER — Encounter: Payer: Self-pay | Admitting: *Deleted

## 2021-07-18 ENCOUNTER — Ambulatory Visit (HOSPITAL_COMMUNITY): Payer: Medicaid Other | Attending: Obstetrics

## 2021-07-18 ENCOUNTER — Ambulatory Visit (HOSPITAL_COMMUNITY): Admission: RE | Admit: 2021-07-18 | Payer: Medicaid Other | Source: Ambulatory Visit

## 2021-07-18 ENCOUNTER — Ambulatory Visit: Payer: Medicaid Other

## 2021-07-18 ENCOUNTER — Encounter (HOSPITAL_COMMUNITY): Payer: Medicaid Other | Admitting: Internal Medicine

## 2021-07-18 NOTE — Telephone Encounter (Signed)
Patient called to let us know that she was running late for her appointment, due to multiple reasons - secure chat was sent to the sonographers, nurses and schedulers - explaining the patient said she would be here by 11am.  Patient never showed.

## 2021-07-25 ENCOUNTER — Inpatient Hospital Stay (HOSPITAL_COMMUNITY): Payer: Medicaid Other | Admitting: Anesthesiology

## 2021-07-25 ENCOUNTER — Other Ambulatory Visit: Payer: Self-pay

## 2021-07-25 ENCOUNTER — Encounter (HOSPITAL_COMMUNITY): Payer: Self-pay | Admitting: Obstetrics and Gynecology

## 2021-07-25 ENCOUNTER — Ambulatory Visit: Payer: Medicaid Other

## 2021-07-25 ENCOUNTER — Inpatient Hospital Stay (HOSPITAL_COMMUNITY)
Admission: AD | Admit: 2021-07-25 | Discharge: 2021-07-26 | DRG: 806 | Disposition: A | Discharge status: Home / Self Care | Payer: Medicaid Other | Attending: Obstetrics and Gynecology | Admitting: Obstetrics and Gynecology

## 2021-07-25 DIAGNOSIS — F411 Generalized anxiety disorder: Secondary | ICD-10-CM | POA: Diagnosis present

## 2021-07-25 DIAGNOSIS — O9902 Anemia complicating childbirth: Secondary | ICD-10-CM | POA: Diagnosis present

## 2021-07-25 DIAGNOSIS — F4310 Post-traumatic stress disorder, unspecified: Secondary | ICD-10-CM | POA: Diagnosis present

## 2021-07-25 DIAGNOSIS — O99324 Drug use complicating childbirth: Secondary | ICD-10-CM | POA: Diagnosis present

## 2021-07-25 DIAGNOSIS — Z20822 Contact with and (suspected) exposure to covid-19: Secondary | ICD-10-CM | POA: Diagnosis present

## 2021-07-25 DIAGNOSIS — O210 Mild hyperemesis gravidarum: Secondary | ICD-10-CM

## 2021-07-25 DIAGNOSIS — F1120 Opioid dependence, uncomplicated: Secondary | ICD-10-CM | POA: Diagnosis present

## 2021-07-25 DIAGNOSIS — O99344 Other mental disorders complicating childbirth: Secondary | ICD-10-CM | POA: Diagnosis present

## 2021-07-25 DIAGNOSIS — O365930 Maternal care for other known or suspected poor fetal growth, third trimester, not applicable or unspecified: Secondary | ICD-10-CM | POA: Diagnosis present

## 2021-07-25 DIAGNOSIS — Z3490 Encounter for supervision of normal pregnancy, unspecified, unspecified trimester: Secondary | ICD-10-CM

## 2021-07-25 DIAGNOSIS — Z3A37 37 weeks gestation of pregnancy: Secondary | ICD-10-CM

## 2021-07-25 DIAGNOSIS — O0990 Supervision of high risk pregnancy, unspecified, unspecified trimester: Secondary | ICD-10-CM

## 2021-07-25 DIAGNOSIS — Z7982 Long term (current) use of aspirin: Secondary | ICD-10-CM | POA: Diagnosis not present

## 2021-07-25 DIAGNOSIS — F410 Panic disorder [episodic paroxysmal anxiety] without agoraphobia: Secondary | ICD-10-CM

## 2021-07-25 DIAGNOSIS — F1190 Opioid use, unspecified, uncomplicated: Secondary | ICD-10-CM

## 2021-07-25 DIAGNOSIS — K5903 Drug induced constipation: Secondary | ICD-10-CM

## 2021-07-25 LAB — CBC
HCT: 28.9 % — ABNORMAL LOW (ref 36.0–46.0)
Hemoglobin: 8.6 g/dL — ABNORMAL LOW (ref 12.0–15.0)
MCH: 23.6 pg — ABNORMAL LOW (ref 26.0–34.0)
MCHC: 29.8 g/dL — ABNORMAL LOW (ref 30.0–36.0)
MCV: 79.2 fL — ABNORMAL LOW (ref 80.0–100.0)
Platelets: 141 10*3/uL — ABNORMAL LOW (ref 150–400)
RBC: 3.65 MIL/uL — ABNORMAL LOW (ref 3.87–5.11)
RDW: 18.4 % — ABNORMAL HIGH (ref 11.5–15.5)
WBC: 6.6 10*3/uL (ref 4.0–10.5)
nRBC: 0 % (ref 0.0–0.2)

## 2021-07-25 LAB — GROUP B STREP BY PCR: Group B strep by PCR: NEGATIVE

## 2021-07-25 LAB — RESP PANEL BY RT-PCR (FLU A&B, COVID) ARPGX2
Influenza A by PCR: NEGATIVE
Influenza B by PCR: NEGATIVE
SARS Coronavirus 2 by RT PCR: NEGATIVE

## 2021-07-25 LAB — TYPE AND SCREEN
ABO/RH(D): A POS
Antibody Screen: NEGATIVE

## 2021-07-25 MED ORDER — SOD CITRATE-CITRIC ACID 500-334 MG/5ML PO SOLN: 30.0000 mL | ORAL | Status: DC | PRN

## 2021-07-25 MED ORDER — TETANUS-DIPHTH-ACELL PERTUSSIS 5-2.5-18.5 LF-MCG/0.5 IM SUSY: 0.5000 mL | PREFILLED_SYRINGE | Freq: Once | INTRAMUSCULAR | Status: DC

## 2021-07-25 MED ORDER — SCOPOLAMINE 1 MG/3DAYS TD PT72: 1.0000 | MEDICATED_PATCH | TRANSDERMAL | Status: DC

## 2021-07-25 MED ORDER — ACETAMINOPHEN 325 MG PO TABS: 650.0000 mg | ORAL_TABLET | ORAL | Status: DC | PRN

## 2021-07-25 MED ORDER — SENNOSIDES-DOCUSATE SODIUM 8.6-50 MG PO TABS
2.0000 | ORAL_TABLET | Freq: Every day | ORAL | Status: DC
  Administered 2021-07-26: 2 via ORAL
  Filled 2021-07-25: qty 2

## 2021-07-25 MED ORDER — IBUPROFEN 600 MG PO TABS
600.0000 mg | ORAL_TABLET | Freq: Once | ORAL | Status: AC
  Administered 2021-07-25: 600 mg via ORAL
  Filled 2021-07-25: qty 1

## 2021-07-25 MED ORDER — EPHEDRINE 5 MG/ML INJ: 10.0000 mg | INTRAVENOUS | Status: DC | PRN

## 2021-07-25 MED ORDER — TRANEXAMIC ACID-NACL 1000-0.7 MG/100ML-% IV SOLN: 1000.0000 mg | INTRAVENOUS | Status: AC

## 2021-07-25 MED ORDER — PHENYLEPHRINE 40 MCG/ML (10ML) SYRINGE FOR IV PUSH (FOR BLOOD PRESSURE SUPPORT): 80.0000 ug | PREFILLED_SYRINGE | INTRAVENOUS | Status: DC | PRN

## 2021-07-25 MED ORDER — POLYETHYLENE GLYCOL 3350 17 G PO PACK
17.0000 g | PACK | Freq: Two times a day (BID) | ORAL | Status: DC
  Filled 2021-07-25 (×2): qty 1

## 2021-07-25 MED ORDER — BENZOCAINE-MENTHOL 20-0.5 % EX AERO
1.0000 "application " | INHALATION_SPRAY | CUTANEOUS | Status: DC | PRN
  Administered 2021-07-25: 1 via TOPICAL
  Filled 2021-07-25: qty 56

## 2021-07-25 MED ORDER — OXYCODONE-ACETAMINOPHEN 5-325 MG PO TABS
2.0000 | ORAL_TABLET | ORAL | Status: DC | PRN
  Administered 2021-07-25: 2 via ORAL
  Filled 2021-07-25: qty 2

## 2021-07-25 MED ORDER — OXYTOCIN BOLUS FROM INFUSION: 333.0000 mL | Freq: Once | INTRAVENOUS | Status: AC

## 2021-07-25 MED ORDER — OXYCODONE-ACETAMINOPHEN 5-325 MG PO TABS: 1.0000 | ORAL_TABLET | ORAL | Status: DC | PRN

## 2021-07-25 MED ORDER — LACTATED RINGERS IV SOLN: 500.0000 mL | Freq: Once | INTRAVENOUS | Status: DC

## 2021-07-25 MED ORDER — METHADONE HCL 10 MG/ML PO CONC
175.0000 mg | Freq: Every day | ORAL | Status: DC
  Administered 2021-07-26: 175 mg via ORAL
  Filled 2021-07-25: qty 20

## 2021-07-25 MED ORDER — SIMETHICONE 80 MG PO CHEW: 80.0000 mg | CHEWABLE_TABLET | ORAL | Status: DC | PRN

## 2021-07-25 MED ORDER — DIPHENHYDRAMINE HCL 25 MG PO CAPS: 25.0000 mg | ORAL_CAPSULE | Freq: Four times a day (QID) | ORAL | Status: DC | PRN

## 2021-07-25 MED ORDER — ACETAMINOPHEN 325 MG PO TABS
650.0000 mg | ORAL_TABLET | ORAL | Status: DC | PRN
  Administered 2021-07-26: 650 mg via ORAL
  Filled 2021-07-25: qty 2

## 2021-07-25 MED ORDER — COCONUT OIL OIL: 1.0000 "application " | TOPICAL_OIL | Status: DC | PRN

## 2021-07-25 MED ORDER — BUPIVACAINE HCL (PF) 0.25 % IJ SOLN
INTRAMUSCULAR | Status: DC | PRN
  Administered 2021-07-25: 1 mL via INTRATHECAL

## 2021-07-25 MED ORDER — PRENATAL MULTIVITAMIN CH
1.0000 | ORAL_TABLET | Freq: Every day | ORAL | Status: DC
  Administered 2021-07-26: 1 via ORAL
  Filled 2021-07-25: qty 1

## 2021-07-25 MED ORDER — ZOLPIDEM TARTRATE 5 MG PO TABS: 5.0000 mg | ORAL_TABLET | Freq: Every evening | ORAL | Status: DC | PRN

## 2021-07-25 MED ORDER — OXYTOCIN 10 UNIT/ML IJ SOLN
INTRAMUSCULAR | Status: AC
  Filled 2021-07-25: qty 1

## 2021-07-25 MED ORDER — LACTATED RINGERS IV SOLN: 500.0000 mL | INTRAVENOUS | Status: DC | PRN

## 2021-07-25 MED ORDER — TRANEXAMIC ACID-NACL 1000-0.7 MG/100ML-% IV SOLN
INTRAVENOUS | Status: AC
  Administered 2021-07-25: 1000 mg
  Filled 2021-07-25: qty 100

## 2021-07-25 MED ORDER — FENTANYL-BUPIVACAINE-NACL 0.5-0.125-0.9 MG/250ML-% EP SOLN
12.0000 mL/h | EPIDURAL | Status: DC | PRN
  Administered 2021-07-25: 12 mL/h via EPIDURAL
  Filled 2021-07-25: qty 250

## 2021-07-25 MED ORDER — SERTRALINE HCL 50 MG PO TABS
50.0000 mg | ORAL_TABLET | Freq: Every day | ORAL | Status: DC
  Administered 2021-07-25 – 2021-07-26 (×2): 50 mg via ORAL
  Filled 2021-07-25 (×2): qty 1

## 2021-07-25 MED ORDER — IBUPROFEN 600 MG PO TABS
600.0000 mg | ORAL_TABLET | Freq: Four times a day (QID) | ORAL | Status: DC
  Administered 2021-07-26: 600 mg via ORAL
  Filled 2021-07-25 (×3): qty 1

## 2021-07-25 MED ORDER — FENTANYL CITRATE (PF) 100 MCG/2ML IJ SOLN
INTRAMUSCULAR | Status: AC
  Filled 2021-07-25: qty 2

## 2021-07-25 MED ORDER — ONDANSETRON HCL 4 MG/2ML IJ SOLN: 4.0000 mg | Freq: Four times a day (QID) | INTRAMUSCULAR | Status: DC | PRN

## 2021-07-25 MED ORDER — OXYTOCIN-SODIUM CHLORIDE 30-0.9 UT/500ML-% IV SOLN: 2.5000 [IU]/h | INTRAVENOUS | Status: DC

## 2021-07-25 MED ORDER — LIDOCAINE HCL (PF) 1 % IJ SOLN: 30.0000 mL | INTRAMUSCULAR | Status: DC | PRN

## 2021-07-25 MED ORDER — ALPRAZOLAM 0.5 MG PO TABS
1.0000 mg | ORAL_TABLET | Freq: Once | ORAL | Status: AC
  Administered 2021-07-25: 1 mg via ORAL
  Filled 2021-07-25 (×3): qty 1
  Filled 2021-07-25 (×2): qty 2

## 2021-07-25 MED ORDER — OXYTOCIN-SODIUM CHLORIDE 30-0.9 UT/500ML-% IV SOLN
INTRAVENOUS | Status: AC
  Administered 2021-07-25: 333 mL/h via INTRAVENOUS
  Filled 2021-07-25: qty 500

## 2021-07-25 MED ORDER — ALPRAZOLAM 0.5 MG PO TABS
1.0000 mg | ORAL_TABLET | Freq: Three times a day (TID) | ORAL | Status: DC | PRN
  Administered 2021-07-26: 1 mg via ORAL
  Filled 2021-07-25: qty 2

## 2021-07-25 MED ORDER — ALPRAZOLAM 0.5 MG PO TABS: 1.0000 mg | ORAL_TABLET | Freq: Two times a day (BID) | ORAL | Status: DC

## 2021-07-25 MED ORDER — DIPHENHYDRAMINE HCL 50 MG/ML IJ SOLN: 12.5000 mg | INTRAMUSCULAR | Status: DC | PRN

## 2021-07-25 MED ORDER — LACTATED RINGERS IV SOLN: INTRAVENOUS | Status: DC

## 2021-07-25 MED ORDER — METHADONE HCL 10 MG/ML PO CONC: 170.0000 mg | Freq: Every day | ORAL | Status: DC

## 2021-07-25 MED ORDER — DIBUCAINE (PERIANAL) 1 % EX OINT: 1.0000 "application " | TOPICAL_OINTMENT | CUTANEOUS | Status: DC | PRN

## 2021-07-25 MED ORDER — WITCH HAZEL-GLYCERIN EX PADS: 1.0000 "application " | MEDICATED_PAD | CUTANEOUS | Status: DC | PRN

## 2021-07-25 MED ORDER — FENTANYL CITRATE (PF) 100 MCG/2ML IJ SOLN
INTRAMUSCULAR | Status: DC | PRN
  Administered 2021-07-25: 15 ug via INTRATHECAL

## 2021-07-25 NOTE — MAU Note (Signed)
Pt presents in labor, SVE 9 per Bhambri,CNM. FHT's obtained. Pt transferred per bed to 202 with Bhambri,CNM at bedside
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
## 2021-07-25 NOTE — Discharge Summary (Signed)
   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 yo G4P2012 at [redacted]w[redacted]d was admitted in Active Labor on 07/25/2021. Patient had an uncomplicated labor course as follows:  Membrane Rupture Time/Date: 12:50 PM ,07/25/2021   Delivery Method:Vaginal, Spontaneous  Episiotomy: None  Lacerations:  None  Patient had an uncomplicated postpartum course.  She is ambulating, tolerating a regular diet, passing flatus, and urinating well. Patient is discharged home in stable condition on 07/26/21.  Newborn Data: Birth date:07/25/2021  Birth time:1:17 PM  Gender:Female  Living status:Living  Apgars:7 ,9  Weight:2260 g   Magnesium Sulfate received: No BMZ received: No Rhophylac:No MMR:No T-DaP: declined Flu: No Transfusion:No  Physical exam  Vitals:   07/25/21 1755 07/25/21 2059 07/26/21 0500 07/26/21 1400  BP: 110/69 (!) 92/59 (!) 90/59 102/60  Pulse: 100 94 85 88  Resp:  18 18 19  Temp: 98.4 F (36.9 C)  98.3 F (36.8 C) 98 F (36.7 C)  TempSrc: Oral  Oral Oral  SpO2: 100% 98%     General: alert, cooperative, and no distress Lochia: appropriate Uterine Fundus: firm Incision: n/a DVT Evaluation: No evidence of DVT seen on physical  exam. Labs: Lab Results  Component Value Date   WBC 5.9 07/26/2021   HGB 7.9 (L) 07/26/2021   HCT 25.7 (L) 07/26/2021   MCV 77.9 (L) 07/26/2021   PLT 129 (L) 07/26/2021   CMP Latest Ref Rng & Units 06/25/2021  Glucose 70 - 99 mg/dL 82  BUN 6 - 20 mg/dL 9  Creatinine 0.44 - 1.00 mg/dL 0.63  Sodium 135 - 145 mmol/L 131(L)  Potassium 3.5 - 5.1 mmol/L 3.8  Chloride 98 - 111 mmol/L 100  CO2 22 - 32 mmol/L 21(L)  Calcium 8.9 - 10.3 mg/dL 8.5(L)  Total Protein 6.5 - 8.1 g/dL 6.1(L)  Total Bilirubin 0.3 - 1.2 mg/dL 0.7  Alkaline Phos 38 - 126 U/L 131(H)  AST 15 - 41 U/L 16  ALT 0 - 44 U/L 11   Edinburgh Score: Edinburgh Postnatal Depression Scale Screening Tool 07/25/2021  I have been able to laugh and see the funny side of things. 1  I have looked forward with enjoyment to things. 1  I have blamed myself unnecessarily when things went wrong. 3  I have been anxious or worried for no good reason. 3  I have felt scared or panicky for no good reason. 3  Things have been getting on top of me. 2  I have been so unhappy that I have had difficulty sleeping. 3  I have felt sad or miserable. 2  I have been so   unhappy that I have been crying. 1  The thought of harming myself has occurred to me. 0  Edinburgh Postnatal Depression Scale Total 19     After visit meds:  Allergies as of 07/26/2021   No Known Allergies      Medication List     STOP taking these medications    aspirin 81 MG EC tablet       TAKE these medications    acetaminophen 500 MG tablet Commonly known as: TYLENOL Take 2 tablets (1,000 mg total) by mouth every 6 (six) hours as needed.   ALPRAZolam 1 MG tablet Commonly known as: Xanax Take 1 tablet (1 mg total) by mouth 3 (three) times daily as needed for anxiety. What changed:  when to take this reasons to take this   benzocaine-Menthol 20-0.5 % Aero Commonly known as: DERMOPLAST Apply 1 application topically as needed for irritation (perineal  discomfort).   ibuprofen 600 MG tablet Commonly known as: ADVIL Take 1 tablet (600 mg total) by mouth every 6 (six) hours.   methadone 10 MG/ML solution Commonly known as: DOLOPHINE Take 170 mg by mouth daily. Verified with on call personnel for [facility] [place],Christina. Last dose was 08-23-20   metoprolol succinate 25 MG 24 hr tablet Commonly known as: TOPROL-XL Take 0.5 tablets (12.5 mg total) by mouth 2 (two) times daily.   naloxone 2 MG/2ML injection Commonly known as: NARCAN Place 2 mg into the nose as needed (overdose).   ondansetron 8 MG disintegrating tablet Commonly known as: ZOFRAN-ODT Take 1 tablet (8 mg total) by mouth every 8 (eight) hours as needed.   polyethylene glycol powder 17 GM/SCOOP powder Commonly known as: GLYCOLAX/MIRALAX Take 17 g by mouth in the morning and at bedtime.   PRENATAL ADULT GUMMY/DHA/FA PO Take by mouth.   progesterone 200 MG capsule Commonly known as: Prometrium Place 1 capsule (200 mg total) vaginally daily.   scopolamine 1 MG/3DAYS Commonly known as: Transderm-Scop (1.5 MG) Place 1 patch (1.5 mg total) onto the skin every 3 (three) days.   sertraline 50 MG tablet Commonly known as: ZOLOFT Take 50 mg by mouth daily.         Discharge home in stable condition Infant Feeding:  donor breastmilk in NICU, mom pumping, UDS results pending. Cocaine positive on 11/13. Infant Disposition:NICU Discharge instruction: per After Visit Summary and Postpartum booklet. Activity: Advance as tolerated. Pelvic rest for 6 weeks.  Diet: routine diet Future Appointments: Future Appointments  Date Time Provider [place]  08/02/2021  3:00 PM Armando Reichert, MD [facility] None  08/14/2021  8:15 AM [facility]  08/31/2021 10:35 AM Clarnce Flock, MD [facility]  09/28/2021  3:20 PM Bensimhon, Shaune Pascal, MD [facility] None   Follow up Visit:  Message sent to [facility] on 07/25/21: Please  schedule this patient for a In person postpartum visit in 6 weeks with the following provider: MD and Dione Plover preferred for OUD . Additional Postpartum F/U:Postpartum Depression checkup  High risk pregnancy complicated by:  methadone use Delivery mode:  Vaginal, Spontaneous  Anticipated Birth Control:  Unsure   07/26/2021 Fatima Blank, CNM

## 2021-07-25 NOTE — Anesthesia Procedure Notes (Signed)
Epidural Patient location during procedure: OB Start time: 07/25/2021 12:04 PM End time: 07/25/2021 12:07 PM  Staffing Anesthesiologist: Kaylyn Layer, MD Performed: anesthesiologist   Preanesthetic Checklist Completed: patient identified, IV checked, risks and benefits discussed, monitors and equipment checked, pre-op evaluation and timeout performed  Epidural Patient position: right lateral decubitus Prep: DuraPrep and site prepped and draped Patient monitoring: heart rate, continuous pulse ox and blood pressure Approach: midline Location: L3-L4 Injection technique: LOR air  Needle:  Needle type: Tuohy  Needle gauge: 17 G Needle length: 9 cm Needle insertion depth: 5 cm Catheter type: closed end flexible Catheter size: 19 Gauge Catheter at skin depth: 10 cm  Assessment Events: blood not aspirated, injection not painful, no injection resistance, no paresthesia and negative IV test  Additional Notes Patient identified. Risks, benefits, and alternatives discussed with patient including but not limited to bleeding, infection, nerve damage, paralysis, failed block, incomplete pain control, headache, blood pressure changes, nausea, vomiting, reactions to medication, itching, and postpartum back pain. Confirmed with bedside nurse the patient's most recent platelet count. Confirmed with patient that they are not currently taking any anticoagulation, have any bleeding history, or any family history of bleeding disorders. Patient expressed understanding and wished to proceed. All questions were answered. Sterile technique was used throughout the entire procedure. Please see nursing notes for vital signs.   Crisp LOR with Tuohy needle on first pass. [place] 25g spinal needle introduced through Tuohy with clear CSF return prior to injection of intrathecal medication. Spinal needle withdrawn and epidural catheter threaded easily. Negative aspiration of catheter for heme or CSF prior  to starting epidural infusion. Warning signs of high block given to the patient including shortness of breath, tingling/numbness in hands, complete motor block, or any concerning symptoms with instructions to call for help. Patient was given instructions on fall risk and not to get out of bed. All questions and concerns addressed with instructions to call with any issues or inadequate analgesia.  Patient comfortable with contractions prior to my leaving room. Reason for block:procedure for pain

## 2021-07-25 NOTE — Anesthesia Preprocedure Evaluation (Signed)
Anesthesia Evaluation  Patient identified by MRN, date of birth, ID band Patient awake    Reviewed: Allergy & Precautions, Patient's Chart, lab work & pertinent test results  History of Anesthesia Complications Negative for: history of anesthetic complications  Airway Mallampati: II  TM Distance: >3 FB Neck ROM: Full    Dental no notable dental hx.    Pulmonary asthma ,    Pulmonary exam normal        Cardiovascular Normal cardiovascular exam  Hx of Takotsubo cardiomyopathy, EF now normal   Neuro/Psych Anxiety Depression negative neurological ROS     GI/Hepatic negative GI ROS, (+)     substance abuse (hx of OUD, now on methadone)  ,   Endo/Other  negative endocrine ROS  Renal/GU negative Renal ROS  negative genitourinary   Musculoskeletal negative musculoskeletal ROS (+)   Abdominal   Peds  Hematology  (+) anemia , Hgb 8.6   Anesthesia Other Findings Day of surgery medications reviewed with patient.  Reproductive/Obstetrics (+) Pregnancy                             Anesthesia Physical Anesthesia Plan  ASA: 3  Anesthesia Plan: Combined Spinal and Epidural   Post-op Pain Management:    Induction:   PONV Risk Score and Plan: Treatment may vary due to age or medical condition  Airway Management Planned: Natural Airway  Additional Equipment: Fetal Monitoring  Intra-op Plan:   Post-operative Plan:   Informed Consent: I have reviewed the patients History and Physical, chart, labs and discussed the procedure including the risks, benefits and alternatives for the proposed anesthesia with the patient or authorized representative who has indicated his/her understanding and acceptance.       Plan Discussed with: CRNA  Anesthesia Plan Comments: (Pt 10cm dilated, discussed plan for CSE and patient gives consent for procedure. Stephannie Peters, MD)        Anesthesia Quick  Evaluation

## 2021-07-26 ENCOUNTER — Ambulatory Visit: Payer: Self-pay

## 2021-07-26 LAB — RAPID URINE DRUG SCREEN, HOSP PERFORMED
Amphetamines: NOT DETECTED
Barbiturates: NOT DETECTED
Benzodiazepines: NOT DETECTED
Cocaine: NOT DETECTED
Opiates: NOT DETECTED
Tetrahydrocannabinol: NOT DETECTED

## 2021-07-26 LAB — CBC
HCT: 25.7 % — ABNORMAL LOW (ref 36.0–46.0)
Hemoglobin: 7.9 g/dL — ABNORMAL LOW (ref 12.0–15.0)
MCH: 23.9 pg — ABNORMAL LOW (ref 26.0–34.0)
MCHC: 30.7 g/dL (ref 30.0–36.0)
MCV: 77.9 fL — ABNORMAL LOW (ref 80.0–100.0)
Platelets: 129 10*3/uL — ABNORMAL LOW (ref 150–400)
RBC: 3.3 MIL/uL — ABNORMAL LOW (ref 3.87–5.11)
RDW: 18.5 % — ABNORMAL HIGH (ref 11.5–15.5)
WBC: 5.9 10*3/uL (ref 4.0–10.5)
nRBC: 0 % (ref 0.0–0.2)

## 2021-07-26 LAB — RPR: RPR Ser Ql: NONREACTIVE

## 2021-07-26 MED ORDER — POLYETHYLENE GLYCOL 3350 17 GM/SCOOP PO POWD: 17.0000 g | Freq: Two times a day (BID) | ORAL | 0 refills | Status: DC

## 2021-07-26 MED ORDER — ALPRAZOLAM 1 MG PO TABS: 1.0000 mg | ORAL_TABLET | Freq: Three times a day (TID) | ORAL | 0 refills | Status: DC | PRN

## 2021-07-26 MED ORDER — SCOPOLAMINE 1 MG/3DAYS TD PT72: 1.0000 | MEDICATED_PATCH | TRANSDERMAL | 2 refills | Status: DC

## 2021-07-26 MED ORDER — ONDANSETRON 8 MG PO TBDP: 8.0000 mg | ORAL_TABLET | Freq: Three times a day (TID) | ORAL | 0 refills | Status: DC | PRN

## 2021-07-26 MED ORDER — BENZOCAINE-MENTHOL 20-0.5 % EX AERO: 1.0000 "application " | INHALATION_SPRAY | CUTANEOUS | 0 refills | Status: DC | PRN

## 2021-07-26 MED ORDER — IBUPROFEN 600 MG PO TABS: 600.0000 mg | ORAL_TABLET | Freq: Four times a day (QID) | ORAL | 0 refills | Status: DC

## 2021-07-26 NOTE — Anesthesia Postprocedure Evaluation (Signed)
Anesthesia Post Note  Patient: [employer]  Procedure(s) Performed: AN AD HOC LABOR EPIDURAL     Patient location during evaluation: Mother Baby Anesthesia Type: Epidural Level of consciousness: awake Pain management: satisfactory to patient Vital Signs Assessment: post-procedure vital signs reviewed and stable Respiratory status: spontaneous breathing Cardiovascular status: stable Anesthetic complications: no   No notable events documented.  Last Vitals:  Vitals:   07/25/21 2059 07/26/21 0500  BP: (!) 92/59 (!) 90/59  Pulse: 94 85  Resp: 18 18  Temp:  36.8 C  SpO2: 98%     Last Pain:  Vitals:   07/26/21 0604  TempSrc:   PainSc: 9    Pain Goal:                   Cephus Shelling
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
## 2021-07-26 NOTE — Progress Notes (Signed)
Urine sample given by patient felt cold to the touch; RN was not in room when patient collected urine sample.  Urine sample labeled and sent to lab.  Patient received a dose of Methadone this morning by the nightshift RN.  MD updated with UDS results.

## 2021-07-26 NOTE — Lactation Note (Signed)
This note was copied from a baby's chart. Lactation Consultation Note  Patient Name: Gabrielle Nguyen Today's Date: 07/26/2021   Age:27 hours  Spoke to baby's provider NP Verne Carrow. and due to mother's Hx of polysubstance abuse; they have deferred this baby from BF. LC services no longer needed at this point.  Consult Status Consult Status: Complete   Gabrielle Nguyen 07/26/2021, 7:07 PM

## 2021-08-01 NOTE — BH Specialist Note (Deleted)
Integrated Behavioral Health via Telemedicine Visit  08/01/2021 Toshika Parrow [id number]  Number of Integrated Behavioral Health visits: 1 Session Start time: 8:15***  Session End time: 9:15*** Total time: {IBH Total Time:21014050}  Referring Provider: *** Patient/Family location: Home*** [facility] Provider location: [facility]  All persons participating in visit: Patient *** and [facility] Sohana Austell ***  Types of Service: {CHL AMB TYPE OF SERVICE:[phone]}  I connected with Venezuela Belinsky and/or Venezuela Gowin's {family members:20773} via  Telephone or Engineer, civil (consulting)  (Video is Surveyor, mining) and verified that I am speaking with the correct person using two identifiers. Discussed confidentiality: {YES/NO:21197}  I discussed the limitations of telemedicine and the availability of in person appointments.  Discussed there is a possibility of technology failure and discussed alternative modes of communication if that failure occurs.  I discussed that engaging in this telemedicine visit, they consent to the provision of behavioral healthcare and the services will be billed under their insurance.  Patient and/or legal guardian expressed understanding and consented to Telemedicine visit: {YES/NO:21197}  Presenting Concerns: Patient and/or family reports the following symptoms/concerns: *** Duration of problem: ***; Severity of problem: {Mild/Moderate/Severe:20260}  Patient and/or Family's Strengths/Protective Factors: {CHL AMB BH PROTECTIVE FACTORS:[phone]}  Goals Addressed: Patient will:  Reduce symptoms of: {IBH Symptoms:21014056}   Increase knowledge and/or ability of: {IBH Patient Tools:21014057}   Demonstrate ability to: {IBH Goals:21014053}  Progress towards Goals: {CHL AMB BH PROGRESS TOWARDS GOALS:[phone]}  Interventions: Interventions utilized:  {IBH  Interventions:21014054} Standardized Assessments completed: {IBH Screening Tools:21014051}  Patient and/or Family Response: ***  Assessment: Patient currently experiencing ***.   Patient may benefit from ***.  Plan: Follow up with behavioral health clinician on : *** Behavioral recommendations: *** Referral(s): {IBH Referrals:21014055}  I discussed the assessment and treatment plan with the patient and/or parent/guardian. They were provided an opportunity to ask questions and all were answered. They agreed with the plan and demonstrated an understanding of the instructions.   They were advised to call back or seek an in-person evaluation if the symptoms worsen or if the condition fails to improve as anticipated.  Valetta Close Onyinyechi Huante, LCSW

## 2021-08-02 ENCOUNTER — Other Ambulatory Visit: Payer: Self-pay

## 2021-08-02 ENCOUNTER — Encounter (HOSPITAL_COMMUNITY): Payer: Self-pay | Admitting: Psychiatry

## 2021-08-02 ENCOUNTER — Ambulatory Visit (INDEPENDENT_AMBULATORY_CARE_PROVIDER_SITE_OTHER): Payer: Medicaid Other | Admitting: Psychiatry

## 2021-08-02 ENCOUNTER — Other Ambulatory Visit (HOSPITAL_COMMUNITY): Payer: Self-pay | Admitting: Psychiatry

## 2021-08-02 VITALS — BP 99/75 | HR 84 | Ht 67.0 in | Wt 116.0 lb

## 2021-08-02 DIAGNOSIS — F411 Generalized anxiety disorder: Secondary | ICD-10-CM | POA: Diagnosis not present

## 2021-08-02 DIAGNOSIS — F331 Major depressive disorder, recurrent, moderate: Secondary | ICD-10-CM | POA: Diagnosis not present

## 2021-08-02 MED ORDER — XANAX 1 MG PO TABS: 1.0000 mg | ORAL_TABLET | Freq: Two times a day (BID) | ORAL | 0 refills | Status: DC | PRN

## 2021-08-02 MED ORDER — ALPRAZOLAM 1 MG PO TABS: 1.0000 mg | ORAL_TABLET | Freq: Two times a day (BID) | ORAL | 0 refills | Status: DC | PRN

## 2021-08-02 MED ORDER — SERTRALINE HCL 100 MG PO TABS: 100.0000 mg | ORAL_TABLET | Freq: Every day | ORAL | 2 refills | Status: DC

## 2021-08-02 NOTE — Patient Instructions (Signed)
Follow-up in 2 weeks
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
## 2021-08-08 ENCOUNTER — Telehealth (HOSPITAL_COMMUNITY): Payer: Self-pay | Admitting: *Deleted

## 2021-08-08 NOTE — Telephone Encounter (Signed)
Patient voiced no concerns regarding her own health. EPDS = 17. Patient reported that she has an appointment with Integrated Behavioral Health on 12/13. Reported that she has already had one appointment with them, and that she is seeing a Librarian, academic at her church. Reports taking medications as prescribed. RN sent in-basket message to [facility] provider, [facility], to report EPDS score.  Patient requested RN email information regarding hospital's virtual Baby and Me class and the virtual breastfeeding and pumping support groups. Email sent. Infant remains in NICU. Deforest Hoyles, RN, 08/08/21, [phone]

## 2021-08-10 ENCOUNTER — Inpatient Hospital Stay (HOSPITAL_COMMUNITY): Admit: 2021-08-10 | Payer: Self-pay

## 2021-08-13 ENCOUNTER — Encounter: Payer: Medicaid Other | Admitting: Family Medicine

## 2021-08-16 ENCOUNTER — Encounter (HOSPITAL_COMMUNITY): Payer: Self-pay | Admitting: Psychiatry

## 2021-08-16 ENCOUNTER — Other Ambulatory Visit: Payer: Self-pay

## 2021-08-16 ENCOUNTER — Ambulatory Visit (INDEPENDENT_AMBULATORY_CARE_PROVIDER_SITE_OTHER): Payer: Medicaid Other | Admitting: Psychiatry

## 2021-08-16 VITALS — BP 85/55 | HR 50 | Ht 67.0 in | Wt 120.0 lb

## 2021-08-16 DIAGNOSIS — F418 Other specified anxiety disorders: Secondary | ICD-10-CM

## 2021-08-16 DIAGNOSIS — F4310 Post-traumatic stress disorder, unspecified: Secondary | ICD-10-CM | POA: Diagnosis not present

## 2021-08-16 DIAGNOSIS — F411 Generalized anxiety disorder: Secondary | ICD-10-CM

## 2021-08-16 MED ORDER — CLONAZEPAM 1 MG PO TABS: 1.0000 mg | ORAL_TABLET | Freq: Two times a day (BID) | ORAL | 0 refills | Status: DC | PRN

## 2021-08-16 NOTE — Patient Instructions (Signed)
Follow-up in 2 weeks
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
## 2021-08-21 ENCOUNTER — Telehealth (HOSPITAL_COMMUNITY): Payer: Self-pay | Admitting: Psychiatry

## 2021-08-21 ENCOUNTER — Telehealth (HOSPITAL_COMMUNITY): Payer: Self-pay | Admitting: *Deleted

## 2021-08-21 NOTE — Telephone Encounter (Signed)
Called wanting to speak with her provider to update her on how she is doing with her medicine. States she had 2 extra Xanax which she took the other day when she had a panic attack and it helped. She states the Klonopin Dr Leone Haven is prescribing her helps with the anxiety and lasts longer but it doesn't help with the "PTSD attacks"she has. Informed her Dr Leone Haven works here on [address], may see this note before then but may not till Thurs.

## 2021-08-21 NOTE — Telephone Encounter (Signed)
Pt calling to report Klonopin does last longer throughout the day, it helps with anxiety over time. Patient also reports Klonopin is not AS effective when having an intense panic attack; it is not quick acting and instantaneous. She reports her in-home nurse came  and noted pt postpartum scale as "off the charts"  Klonopin 2 times daily for 14 days (ends 12/29) Please advise. [facility] uses [facility] on [address].

## 2021-08-23 ENCOUNTER — Telehealth (HOSPITAL_COMMUNITY): Payer: Self-pay | Admitting: Psychiatry

## 2021-08-23 NOTE — Telephone Encounter (Cosign Needed)
Called 7 more days of Klonopin prescription to [facility] which will be good until next follow up on 09/06/20. Called and informed Patient about extra 7 days of refill.

## 2021-08-31 ENCOUNTER — Ambulatory Visit: Payer: Self-pay | Admitting: Family Medicine

## 2021-09-04 ENCOUNTER — Ambulatory Visit: Payer: Self-pay | Admitting: Family Medicine

## 2021-09-04 ENCOUNTER — Encounter: Payer: Self-pay | Admitting: Family Medicine

## 2021-09-04 NOTE — Progress Notes (Deleted)
° ° °  Post Partum Visit Note  Gabrielle Nguyen is a 26 y.o. [phone] female who presents for a postpartum visit. She is 6 weeks postpartum following a normal spontaneous vaginal delivery.  I have fully reviewed the prenatal and intrapartum course. The delivery was at 37.5 gestational weeks.  Anesthesia: {anesthesia types:812}. Postpartum course has been ***. Baby is doing well***. Baby is feeding by {breast/bottle:69}. Bleeding {vag bleed:12292}. Bowel function is {normal:32111}. Bladder function is {normal:32111}. Patient {is/is not:9024} sexually active. Contraception method is {contraceptive method:5051}. Postpartum depression screening: {gen negative/positive:315881}.   The pregnancy intention screening data noted above was reviewed. Potential methods of contraception were discussed. The patient elected to proceed with No data recorded.    Health Maintenance Due  Topic Date Due   COVID-19 Vaccine (1) Never done   Pneumococcal Vaccine 10-74 Years old (1 - PCV) Never done   PAP-Cervical Cytology Screening  Never done   PAP SMEAR-Modifier  Never done   INFLUENZA VACCINE  04/02/2021    {Common ambulatory SmartLinks:19316}  Review of Systems {ros; complete:30496}  Objective:  LMP  (LMP Unknown)    Breastfeeding Unknown    General:  {gen appearance:16600}   Breasts:  {desc; normal/abnormal/not indicated:14647}  Lungs: {lung exam:16931}  Heart:  {heart exam:5510}  Abdomen: {abdomen exam:16834}   Wound {Wound assessment:11097}  GU exam:  {desc; normal/abnormal/not indicated:14647}       Assessment:    There are no diagnoses linked to this encounter.  *** postpartum exam.   Plan:   Essential components of care per ACOG recommendations:  1.  Mood and well being: Patient with {gen negative/positive:315881} depression screening today. Reviewed local resources for support.  - Patient tobacco use? {tobacco use:25506}  - hx of drug use? {yes/no:25505}    2. Infant care and feeding:   -Patient currently breastmilk feeding? {yes/no:25502}  -Social determinants of health (SDOH) reviewed in EPIC. No concerns***The following needs were identified***  3. Sexuality, contraception and birth spacing - Patient {DOES_DOES CEY:22336} want a pregnancy in the next year.  Desired family size is {NUMBER 1-10:22536} children.  - Reviewed forms of contraception in tiered fashion. Patient desired {PLAN CONTRACEPTION:313102} today.   - Discussed birth spacing of 18 months  4. Sleep and fatigue -Encouraged family/partner/community support of 4 hrs of uninterrupted sleep to help with mood and fatigue  5. Physical Recovery  - Discussed patients delivery and complications. She describes her labor as {description:25511} - Patient had a {CHL AMB DELIVERY:[phone]}. Patient had a {laceration:25518} laceration. Perineal healing reviewed. Patient expressed understanding - Patient has urinary incontinence? {yes/no:25515} - Patient {ACTION; IS/IS PQA:[id number]} safe to resume physical and sexual activity  6.  Health Maintenance - HM due items addressed {Yes or If no, why not?:20788} - Last pap smear No results found for: DIAGPAP Pap smear {done:10129} at today's visit.  -Breast Cancer screening indicated? {indicated:25516}  7. Chronic Disease/Pregnancy Condition follow up: {Follow up:25499}  - PCP follow up  Isabell Jarvis, RN Center for [employer], [facility]

## 2021-09-06 ENCOUNTER — Ambulatory Visit (INDEPENDENT_AMBULATORY_CARE_PROVIDER_SITE_OTHER): Payer: Medicaid Other | Admitting: Psychiatry

## 2021-09-06 ENCOUNTER — Encounter (HOSPITAL_COMMUNITY): Payer: Self-pay | Admitting: Psychiatry

## 2021-09-06 VITALS — BP 128/87 | HR 65 | Ht 67.0 in

## 2021-09-06 DIAGNOSIS — F4310 Post-traumatic stress disorder, unspecified: Secondary | ICD-10-CM | POA: Diagnosis not present

## 2021-09-06 DIAGNOSIS — F411 Generalized anxiety disorder: Secondary | ICD-10-CM | POA: Diagnosis not present

## 2021-09-06 DIAGNOSIS — F331 Major depressive disorder, recurrent, moderate: Secondary | ICD-10-CM | POA: Diagnosis not present

## 2021-09-06 MED ORDER — SERTRALINE HCL 100 MG PO TABS: 200.0000 mg | ORAL_TABLET | Freq: Every day | ORAL | 2 refills | Status: DC

## 2021-09-06 MED ORDER — CLONAZEPAM 1 MG PO TABS: 1.0000 mg | ORAL_TABLET | Freq: Two times a day (BID) | ORAL | 0 refills | Status: DC | PRN

## 2021-09-06 NOTE — Patient Instructions (Signed)
Follow-up in 3 weeks
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
## 2021-09-17 ENCOUNTER — Telehealth: Payer: Self-pay

## 2021-09-17 NOTE — Telephone Encounter (Signed)
Spoke with pt. Pt states needing to change PP visit from morning til afternoon due to court time at 10am. Pt rescheduled for 4:15pm with Dr Crissie Reese on 09/18/21. Pt agreeable and verbalized understanding to change of appt time. Pt will call or send mychart message to office if unable to make it tomorrow.  Judeth Cornfield, RN

## 2021-09-17 NOTE — Progress Notes (Signed)
Post Partum Visit Note  Gabrielle Nguyen is a 26 y.o. [phone] female who presents for a postpartum visit. She is 8 weeks postpartum following a normal spontaneous vaginal delivery.  I have fully reviewed the prenatal and intrapartum course. The delivery was at 37.5 gestational weeks.  Anesthesia: epidural. Postpartum course has been very very stressful. Baby is doing well. Baby is feeding by bottle - Nutramagin . Bleeding no bleeding. Bowel function is normal. Bladder function is normal. Patient is sexually active. Contraception method is condoms. Postpartum depression screening: positive.  Still going to [facility] methadone clinic, trying to wean, down to 130 mg Has good relationship with doctor there  Does not like her psychiatrist Feels she does not know what she's doing Feels like she has messed up her prescriptions on multiple occasions Upset that she has been switched from Xanax to Klonopin Significant stress, was in court earlier  Denies SI  The pregnancy intention screening data noted above was reviewed. Potential methods of contraception were discussed. The patient elected to proceed with No data recorded.   Edinburgh Postnatal Depression Scale - 09/18/21 1614       Edinburgh Postnatal Depression Scale:  In the Past 7 Days   I have been able to laugh and see the funny side of things. 2    I have looked forward with enjoyment to things. 3    I have blamed myself unnecessarily when things went wrong. 3    I have been anxious or worried for no good reason. 3    I have felt scared or panicky for no good reason. 3    Things have been getting on top of me. 3    I have been so unhappy that I have had difficulty sleeping. 3    I have felt sad or miserable. 3    I have been so unhappy that I have been crying. 3    The thought of harming myself has occurred to me. 0    Edinburgh Postnatal Depression Scale Total 26             Health Maintenance Due  Topic Date Due    COVID-19 Vaccine (1) Never done   Pneumococcal Vaccine 29-78 Years old (1 - PCV) Never done   PAP-Cervical Cytology Screening  Never done   PAP SMEAR-Modifier  Never done   INFLUENZA VACCINE  04/02/2021    The following portions of the patient's history were reviewed and updated as appropriate: allergies, current medications, past family history, past medical history, past social history, past surgical history, and problem list.  Review of Systems Pertinent items noted in HPI and remainder of comprehensive ROS otherwise negative.  Objective:  BP 132/79    Pulse (!) 116    Wt 126 lb (57.2 kg)    BMI 19.73 kg/m    General:  alert, cooperative, and distracted   Breasts:  not indicated  Lungs: Comfortable on room air  GU exam:  not indicated       Assessment:    There are no diagnoses linked to this encounter.  Routine postpartum exam.   Plan:   Essential components of care per ACOG recommendations:  1.  Mood and well being: Patient with positive depression screening today. Already established with psych though having the above issues. Discussed she can transfer care if she desires.  - Patient tobacco use? No.   - hx of drug use? Yes, see below    2. Infant care and  feeding:  -Patient currently breastmilk feeding? No.    3. Sexuality, contraception and birth spacing - Patient does not want a pregnancy in the next year.  Desired family size is 3 children.  - Reviewed forms of contraception in tiered fashion. Patient unsure about what method she would like, very sure she does not want pregnancy. Became very agitated when we started to discuss it, defer to follow up visit.    4. Physical Recovery  - Patient had a Vaginal, no problems at delivery. Patient had no laceration. Perineal healing reviewed. Patient expressed understanding - Patient has urinary incontinence? No. - Patient is safe to resume physical and sexual activity  6.  Health Maintenance - HM due items addressed  No - declined pap today - Last pap smear No results found for: DIAGPAP Pap smear not done at today's visit.  -Breast Cancer screening indicated? No.   7. Chronic Disease/Pregnancy Condition follow up:  OUD, anxiety/depression - following w Methadone clinic - long discussion about benzo's, discussed she currently has supply of klonopin based on PDMP and we explicitly transferred her care to psychiatry to assume this prescribing so I will not be able to give her rx.  - PCP follow up  Venora Maples, MD Center for [facility], [facility]

## 2021-09-17 NOTE — Telephone Encounter (Addendum)
Called pt second attempt at [phone]. Patient answers the phone. States she does not want to see behavioral health doctor again. States the doctor told her that she should follow up in 3 weeks but there wasn't an appointment available. Pt is concerned because she has run out of Xanax and has been taking this since she was 26 yrs old. States that her psych doctor stopped Xanax and then started Klonopin but has now changed back to Xanax. States she last took Xanax a few days ago. Currently taking: Metoprolol 12.5 mg BID, aspirin 81 mg daily, Zoloft 200 mg daily, Methodone 130 mg daily

## 2021-09-17 NOTE — Telephone Encounter (Signed)
Patient left VM on nurse line stating she is unable to come to provider visit tomorrow due to custody court of her child. States her lawyer just informed her of this date. Patient is tearful on voicemail. Reports concerns that she will run out of medication for her heart, PTSD, and anxiety.  Called [phone]; unable to leave VM due to VM box being full.  Called [phone]; call cannot be completed as dialed.

## 2021-09-18 ENCOUNTER — Ambulatory Visit: Payer: Self-pay | Admitting: Family Medicine

## 2021-09-18 ENCOUNTER — Ambulatory Visit (INDEPENDENT_AMBULATORY_CARE_PROVIDER_SITE_OTHER): Payer: Self-pay | Admitting: Family Medicine

## 2021-09-18 ENCOUNTER — Encounter: Payer: Self-pay | Admitting: Family Medicine

## 2021-09-18 ENCOUNTER — Other Ambulatory Visit: Payer: Self-pay

## 2021-09-24 ENCOUNTER — Telehealth (HOSPITAL_COMMUNITY): Payer: Self-pay | Admitting: *Deleted

## 2021-09-24 NOTE — Telephone Encounter (Signed)
Patient called stated that she will run out med's before she sees you again on 2/9 clonazePAM (KLONOPIN) 1 MG tablet 42 tablet 0 09/06/2021 09/27/2021   Sig - Route: Take 1 tablet (1 mg total) by mouth 2 (two) times daily as needed for up to 21 days for anxiety

## 2021-09-27 ENCOUNTER — Ambulatory Visit (INDEPENDENT_AMBULATORY_CARE_PROVIDER_SITE_OTHER): Payer: Medicaid Other | Admitting: Psychiatry

## 2021-09-27 ENCOUNTER — Encounter (HOSPITAL_COMMUNITY): Payer: Self-pay | Admitting: Internal Medicine

## 2021-09-27 ENCOUNTER — Other Ambulatory Visit: Payer: Self-pay

## 2021-09-27 ENCOUNTER — Encounter: Payer: Self-pay | Admitting: Family Medicine

## 2021-09-27 DIAGNOSIS — F410 Panic disorder [episodic paroxysmal anxiety] without agoraphobia: Secondary | ICD-10-CM

## 2021-09-27 DIAGNOSIS — F418 Other specified anxiety disorders: Secondary | ICD-10-CM

## 2021-09-27 DIAGNOSIS — F411 Generalized anxiety disorder: Secondary | ICD-10-CM

## 2021-09-27 DIAGNOSIS — F4310 Post-traumatic stress disorder, unspecified: Secondary | ICD-10-CM | POA: Diagnosis not present

## 2021-09-27 MED ORDER — OXCARBAZEPINE 150 MG PO TABS: 150.0000 mg | ORAL_TABLET | Freq: Two times a day (BID) | ORAL | 2 refills | Status: DC

## 2021-09-27 MED ORDER — CLONAZEPAM 1 MG PO TABS: 1.0000 mg | ORAL_TABLET | Freq: Two times a day (BID) | ORAL | 0 refills | Status: DC | PRN

## 2021-09-27 NOTE — Patient Instructions (Signed)
Follow-up in 2 weeks
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
## 2021-09-28 ENCOUNTER — Telehealth (HOSPITAL_COMMUNITY): Payer: Self-pay | Admitting: Psychiatry

## 2021-09-28 ENCOUNTER — Encounter (HOSPITAL_COMMUNITY): Payer: Medicaid Other | Admitting: Internal Medicine

## 2021-09-28 ENCOUNTER — Encounter (HOSPITAL_COMMUNITY): Payer: Self-pay

## 2021-09-28 NOTE — Telephone Encounter (Signed)
DEA number not on prescription yesterday.  Please call pharmacy with DEA#.   [facility].

## 2021-10-11 ENCOUNTER — Encounter (HOSPITAL_COMMUNITY): Payer: Self-pay | Admitting: Psychiatry

## 2021-10-11 ENCOUNTER — Ambulatory Visit (INDEPENDENT_AMBULATORY_CARE_PROVIDER_SITE_OTHER): Payer: Medicaid Other | Admitting: Psychiatry

## 2021-10-11 ENCOUNTER — Other Ambulatory Visit: Payer: Self-pay

## 2021-10-11 VITALS — Wt 129.4 lb

## 2021-10-11 DIAGNOSIS — F4310 Post-traumatic stress disorder, unspecified: Secondary | ICD-10-CM

## 2021-10-11 DIAGNOSIS — F324 Major depressive disorder, single episode, in partial remission: Secondary | ICD-10-CM

## 2021-10-11 DIAGNOSIS — F418 Other specified anxiety disorders: Secondary | ICD-10-CM

## 2021-10-11 DIAGNOSIS — F411 Generalized anxiety disorder: Secondary | ICD-10-CM

## 2021-10-11 MED ORDER — CLONAZEPAM 1 MG PO TABS: 1.0000 mg | ORAL_TABLET | Freq: Two times a day (BID) | ORAL | 0 refills | Status: DC | PRN

## 2021-10-11 MED ORDER — OXCARBAZEPINE 300 MG PO TABS: 300.0000 mg | ORAL_TABLET | Freq: Two times a day (BID) | ORAL | 2 refills | Status: DC

## 2021-10-11 NOTE — Patient Instructions (Signed)
Follow-up in 3 weeks
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
## 2021-10-16 ENCOUNTER — Ambulatory Visit: Payer: Self-pay | Admitting: Family Medicine

## 2021-11-01 ENCOUNTER — Ambulatory Visit (INDEPENDENT_AMBULATORY_CARE_PROVIDER_SITE_OTHER): Payer: Medicaid Other | Admitting: Psychiatry

## 2021-11-01 ENCOUNTER — Encounter (HOSPITAL_COMMUNITY): Payer: Self-pay | Admitting: Psychiatry

## 2021-11-01 ENCOUNTER — Other Ambulatory Visit: Payer: Self-pay

## 2021-11-01 VITALS — BP 128/93 | HR 88 | Ht 67.0 in | Wt 122.0 lb

## 2021-11-01 DIAGNOSIS — F411 Generalized anxiety disorder: Secondary | ICD-10-CM | POA: Diagnosis not present

## 2021-11-01 DIAGNOSIS — F4310 Post-traumatic stress disorder, unspecified: Secondary | ICD-10-CM | POA: Diagnosis not present

## 2021-11-01 DIAGNOSIS — F324 Major depressive disorder, single episode, in partial remission: Secondary | ICD-10-CM

## 2021-11-01 MED ORDER — HYDROXYZINE PAMOATE 25 MG PO CAPS: 25.0000 mg | ORAL_CAPSULE | Freq: Two times a day (BID) | ORAL | 0 refills | Status: AC | PRN

## 2021-11-01 MED ORDER — CLONAZEPAM 1 MG PO TABS: 1.0000 mg | ORAL_TABLET | Freq: Two times a day (BID) | ORAL | 0 refills | Status: DC | PRN
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
## 2021-11-01 NOTE — Patient Instructions (Addendum)
Follow up in  2 weeks.  ?Recommend to start Therapy at [facility] Outpatient clinic. Can come for walk in therapy. Mo, Tues or Wed at 7.30 am.  ?

## 2021-11-06 ENCOUNTER — Telehealth (HOSPITAL_COMMUNITY): Payer: Self-pay | Admitting: *Deleted

## 2021-11-06 ENCOUNTER — Other Ambulatory Visit (HOSPITAL_COMMUNITY): Payer: Self-pay

## 2021-11-06 NOTE — Telephone Encounter (Signed)
Call from [place], Gabrielle Nguyen, she would like Dr Rosita Kea to call her back re patient. Will forward request for Dr Mathis Dad to call [phone]. ?

## 2021-11-08 ENCOUNTER — Telehealth (HOSPITAL_COMMUNITY): Payer: Self-pay | Admitting: Psychiatry

## 2021-11-08 NOTE — Telephone Encounter (Cosign Needed)
Telephone Note ?Got a message from [place] [phone]) that she wants to speak to me about this patient.  ?Received signed consent from patient to release information to DSS agent. ?Talked to [place] @[phone]- Provided information needed by DSS for patient's diagnosis and current medications.  Apolonio Schneiders asked me if I have any concerns about patient's ability to parent.  Told her that we cannot comment on patient's ability to parent her child. There should be a separate evaluation for that which we do not do here. ? ?Armando Reichert, MD  ?Pgy2 ?Psychiatry ?

## 2021-11-15 ENCOUNTER — Ambulatory Visit (HOSPITAL_COMMUNITY): Payer: Medicaid Other | Admitting: Psychiatry

## 2021-11-15 ENCOUNTER — Other Ambulatory Visit: Payer: Self-pay

## 2021-11-15 MED ORDER — CLONAZEPAM 1 MG PO TABS: 1.0000 mg | ORAL_TABLET | Freq: Two times a day (BID) | ORAL | 0 refills | Status: DC | PRN

## 2021-11-15 NOTE — Patient Instructions (Signed)
Follow-up in 2 weeks
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
## 2021-11-26 ENCOUNTER — Other Ambulatory Visit: Payer: Self-pay

## 2021-11-26 ENCOUNTER — Emergency Department (HOSPITAL_COMMUNITY)
Admission: EM | Admit: 2021-11-26 | Discharge: 2021-11-28 | Disposition: A | Discharge status: Other Acute Inpatient Hospital | Payer: Medicaid Other | Attending: Student | Admitting: Student

## 2021-11-26 ENCOUNTER — Emergency Department (HOSPITAL_COMMUNITY): Payer: Medicaid Other

## 2021-11-26 ENCOUNTER — Encounter (HOSPITAL_COMMUNITY): Payer: Self-pay | Admitting: Emergency Medicine

## 2021-11-26 DIAGNOSIS — R451 Restlessness and agitation: Secondary | ICD-10-CM | POA: Insufficient documentation

## 2021-11-26 DIAGNOSIS — R45851 Suicidal ideations: Secondary | ICD-10-CM | POA: Insufficient documentation

## 2021-11-26 DIAGNOSIS — Z20822 Contact with and (suspected) exposure to covid-19: Secondary | ICD-10-CM | POA: Insufficient documentation

## 2021-11-26 DIAGNOSIS — Z79899 Other long term (current) drug therapy: Secondary | ICD-10-CM | POA: Insufficient documentation

## 2021-11-26 DIAGNOSIS — W01198A Fall on same level from slipping, tripping and stumbling with subsequent striking against other object, initial encounter: Secondary | ICD-10-CM | POA: Insufficient documentation

## 2021-11-26 DIAGNOSIS — I5021 Acute systolic (congestive) heart failure: Secondary | ICD-10-CM | POA: Insufficient documentation

## 2021-11-26 DIAGNOSIS — J45909 Unspecified asthma, uncomplicated: Secondary | ICD-10-CM | POA: Insufficient documentation

## 2021-11-26 DIAGNOSIS — Y900 Blood alcohol level of less than 20 mg/100 ml: Secondary | ICD-10-CM | POA: Insufficient documentation

## 2021-11-26 DIAGNOSIS — S0101XA Laceration without foreign body of scalp, initial encounter: Secondary | ICD-10-CM | POA: Insufficient documentation

## 2021-11-26 LAB — CBC WITH DIFFERENTIAL/PLATELET
Abs Immature Granulocytes: 0.02 10*3/uL (ref 0.00–0.07)
Basophils Absolute: 0 10*3/uL (ref 0.0–0.1)
Basophils Relative: 1 %
Eosinophils Absolute: 0.2 10*3/uL (ref 0.0–0.5)
Eosinophils Relative: 3 %
HCT: 31.1 % — ABNORMAL LOW (ref 36.0–46.0)
Hemoglobin: 9.5 g/dL — ABNORMAL LOW (ref 12.0–15.0)
Immature Granulocytes: 0 %
Lymphocytes Relative: 24 %
Lymphs Abs: 1.3 10*3/uL (ref 0.7–4.0)
MCH: 24.1 pg — ABNORMAL LOW (ref 26.0–34.0)
MCHC: 30.5 g/dL (ref 30.0–36.0)
MCV: 78.7 fL — ABNORMAL LOW (ref 80.0–100.0)
Monocytes Absolute: 0.4 10*3/uL (ref 0.1–1.0)
Monocytes Relative: 7 %
Neutro Abs: 3.5 10*3/uL (ref 1.7–7.7)
Neutrophils Relative %: 65 %
Platelets: 241 10*3/uL (ref 150–400)
RBC: 3.95 MIL/uL (ref 3.87–5.11)
RDW: 15.5 % (ref 11.5–15.5)
WBC: 5.4 10*3/uL (ref 4.0–10.5)
nRBC: 0 % (ref 0.0–0.2)

## 2021-11-26 LAB — COMPREHENSIVE METABOLIC PANEL
ALT: 17 U/L (ref 0–44)
AST: 33 U/L (ref 15–41)
Albumin: 4.5 g/dL (ref 3.5–5.0)
Alkaline Phosphatase: 48 U/L (ref 38–126)
Anion gap: 10 (ref 5–15)
BUN: 18 mg/dL (ref 6–20)
CO2: 23 mmol/L (ref 22–32)
Calcium: 9.6 mg/dL (ref 8.9–10.3)
Chloride: 107 mmol/L (ref 98–111)
Creatinine, Ser: 0.59 mg/dL (ref 0.44–1.00)
GFR, Estimated: >60 mL/min (ref >60)
Glucose, Bld: 81 mg/dL (ref 70–99)
Potassium: 3.5 mmol/L (ref 3.5–5.1)
Sodium: 140 mmol/L (ref 135–145)
Total Bilirubin: 0.4 mg/dL (ref 0.3–1.2)
Total Protein: 8 g/dL (ref 6.5–8.1)

## 2021-11-26 LAB — ETHANOL: Alcohol, Ethyl (B): <10 mg/dL (ref <10)

## 2021-11-26 LAB — SALICYLATE LEVEL: Salicylate Lvl: <7 mg/dL — ABNORMAL LOW (ref 7.0–30.0)

## 2021-11-26 LAB — ACETAMINOPHEN LEVEL: Acetaminophen (Tylenol), Serum: <10 ug/mL — ABNORMAL LOW (ref 10–30)

## 2021-11-26 MED ORDER — HYDROGEN PEROXIDE 3 % EX SOLN
Freq: Once | CUTANEOUS | Status: AC
  Filled 2021-11-26: qty 473

## 2021-11-26 NOTE — ED Triage Notes (Signed)
Pt arrived via EMS from home. Pt told EMS that something fell and hit the back of her head. Pt states her head hurts. Pt is having a behavioral episode per EMS. Pt recently lost custody of her children and has been manic per EMS. ?
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
## 2021-11-27 LAB — URINALYSIS, ROUTINE W REFLEX MICROSCOPIC
Bilirubin Urine: NEGATIVE
Glucose, UA: NEGATIVE mg/dL
Hgb urine dipstick: NEGATIVE
Ketones, ur: 80 mg/dL — AB
Nitrite: NEGATIVE
Protein, ur: NEGATIVE mg/dL
Specific Gravity, Urine: 1.019 (ref 1.005–1.030)
pH: 5 (ref 5.0–8.0)

## 2021-11-27 LAB — RAPID URINE DRUG SCREEN, HOSP PERFORMED
Amphetamines: POSITIVE — AB
Barbiturates: NOT DETECTED
Benzodiazepines: POSITIVE — AB
Cocaine: POSITIVE — AB
Opiates: NOT DETECTED
Tetrahydrocannabinol: POSITIVE — AB

## 2021-11-27 LAB — RESP PANEL BY RT-PCR (FLU A&B, COVID) ARPGX2
Influenza A by PCR: NEGATIVE
Influenza B by PCR: NEGATIVE
SARS Coronavirus 2 by RT PCR: NEGATIVE

## 2021-11-27 LAB — PREGNANCY, URINE: Preg Test, Ur: NEGATIVE

## 2021-11-27 MED ORDER — OXCARBAZEPINE 300 MG PO TABS
300.0000 mg | ORAL_TABLET | Freq: Two times a day (BID) | ORAL | Status: DC
  Administered 2021-11-27 – 2021-11-28 (×3): 300 mg via ORAL
  Filled 2021-11-27 (×3): qty 1

## 2021-11-27 MED ORDER — METHADONE HCL 10 MG/ML PO CONC
175.0000 mg | Freq: Every day | ORAL | Status: DC
  Administered 2021-11-27: 175 mg via ORAL
  Filled 2021-11-27 (×3): qty 20

## 2021-11-27 MED ORDER — CLONAZEPAM 0.5 MG PO TABS
1.0000 mg | ORAL_TABLET | Freq: Two times a day (BID) | ORAL | Status: DC | PRN
  Administered 2021-11-27 – 2021-11-28 (×3): 1 mg via ORAL
  Filled 2021-11-27 (×3): qty 2

## 2021-11-27 MED ORDER — SERTRALINE HCL 50 MG PO TABS
200.0000 mg | ORAL_TABLET | Freq: Every day | ORAL | Status: DC
  Administered 2021-11-27 – 2021-11-28 (×2): 200 mg via ORAL
  Filled 2021-11-27 (×2): qty 4

## 2021-11-27 NOTE — ED Provider Notes (Signed)
Emergency Medicine Observation Re-evaluation Note ? ?Gabrielle Nguyen is a 26 y.o. female, seen on rounds today.  Pt initially presented to the ED for complaints of Head Laceration, Agitation, and Suicidal ? ? ?ED Course / MDM  ? ?Patient initially presented overnight voluntarily for psychiatric evaluation.  Reportedly had sent text messages to her father endorsing desire to kill herself.  Psychiatry evaluated the patient and felt that she met criteria for inpatient treatment.  Patient no longer voluntarily willing to stay in the emergency department.  IVC paperwork filed. ? ?Plan  ?Current plan is for inpatient psychiatric treatment, social work coordinating disposition. ? Yanissa Lohmeyer is under involuntary commitment. ? ? ?  ?Regan Lemming, MD ?11/27/21 1041 ? ?

## 2021-11-27 NOTE — ED Notes (Signed)
Patient resting quietly.

## 2021-11-27 NOTE — ED Notes (Signed)
This Clinical research associate accessed Pt's chart because Pt had questions regarding her care and Primary RN was unavailable.  ?

## 2021-11-27 NOTE — Progress Notes (Signed)
Per the request of Dr. Lucianne Muss, Pt has been faxed out. Patient meets [facility] inpatient criteria per Melbourne Abts, PA-C. Patient has been faxed out to the following facilities:  ? ? ?[facility]  [address] [place] [address] [phone]  ?[facility]  [address] [place] [address] [phone]  ?[facility]  [address] [place] [address] [phone]  ?[facility]  [address] [place] [address] [phone]  ?[facility]  [address], [place] [address] [phone]  ?[facility]  [address] [place], [place] [address] [phone]  ?[facility]  [address] [place], [place] [address] [phone]  ?[facility]  [address] [place] [address] [phone]  ?[facility]  [address] [place] [address] [phone]  ?[facility]  [address] [place] [address] [phone]  ?[facility]  [address] [place] [address] [phone]  ?[facility]  [address] [place] [address] [phone]  ? ?Damita Dunnings, MSW, LCSW-A  ?10:17 AM 11/27/2021   ?

## 2021-11-27 NOTE — ED Notes (Signed)
Pt ambulatory to bathroom to attempt to provide UA. ?
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
## 2021-11-27 NOTE — ED Notes (Signed)
Pt requested to leave. Team contacted and IVC process begun d/t pt being recommended for inpatient treatment. ?

## 2021-11-27 NOTE — Progress Notes (Signed)
CSW sent facesheet to [facility] to provide insurance information. Pt is currently being review by [facility]. CSW will assist and follow. ? ?Gabrielle Nguyen, MSW, LCSWA ?11/27/2021 5:44 PM ? ? ?

## 2021-11-27 NOTE — ED Provider Notes (Signed)
Emergency Medicine Observation Re-evaluation Note ? ?Gabrielle Nguyen is a 26 y.o. female, seen on rounds today.  Pt initially presented to the ED for complaints of Head Laceration, Agitation, and Suicidal ?Currently, the patient is awake, resting comfortably. ? ?Physical Exam  ?BP 97/61   Pulse 75   Temp 97.9 ?F (36.6 ?C) (Oral)   Resp 18   Ht 5\' 6"  (1.676 m)   Wt 58.1 kg   SpO2 96%   BMI 20.66 kg/m?  ?Physical Exam ?General: NAD ?Cardiac: well perfused ?Lungs: even and unlabored ?Psych: no agitation ? ?ED Course / MDM  ?EKG:  ? ?I have reviewed the labs performed to date as well as medications administered while in observation.  Recent changes in the last 24 hours include evaluated by psychiatry, deemed to meet criteria for inpatient treatment. ? ?Plan  ?Current plan is for social work coordinating inpatient treatment. ? Beola Godar is not under involuntary commitment. ? ? ?  ? , MD ?11/27/21 [phone] ? ?

## 2021-11-27 NOTE — ED Notes (Signed)
Call received from pt friend Margarito Courser [phone] requesting rtn call for pt status/updates. ENMiles ?

## 2021-11-27 NOTE — ED Notes (Signed)
Patient resting quietly in bed with eyes closed.  Respirations even and unlabored.  Patient has remained calm and cooperative.  ?

## 2021-11-27 NOTE — ED Notes (Signed)
TTS complete.  Patient meets inpatient criteria  ?

## 2021-11-27 NOTE — Consult Note (Signed)
?  Patient seen in its entirety by TTS, please refer to comprehensive clinical assessment completed this morning at 5 AM.  Current recommendations will remain for inpatient psychiatric hospitalization.  Patient has been receiving outpatient psychiatric services, and despite compliance with such she has presented to the emergency room where she is endorsing suicidal ideations.  Patient has previous psychiatric history of major depressive disorder, panic disorder, opiate use disorder, PTSD, and is receiving outpatient services at [facility].  Chart review shows her last outpatient visit on March 16, she endorsed increase in anxiety due to recent family situation where her husband separated and her daughter is with the husband.  She is currently completing a benzodiazepine taper, current overdose risk score of 680.  Patient recently delivered a baby 4 months ago. ? ?As noted above patient has multiple risk factors to place her at high risk for suicide completion, to include sending text messages out to family members. ? ?-Will continue to recommend inpatient psychiatric hospitalization at this time. ?-TTS will continue to follow. ?

## 2021-11-27 NOTE — Progress Notes (Signed)
[facility] was [facility] 11/28/[facility]; Bed Assignment Susann Givens 2 [place] ? ?Pt meets inpatient criteria per Melbourne Abts, PA-C ? ?Attending Physician will be Dr.  Roselyn Reef ? ?Report can be called to: - Child and Adolescence unit: [phone] -Adult unit: [phone] ? ?Pt can arrive after 9am on 11/28/21 ? ?Nursing notified: Army Chaco, RN ? ?Kelton Pillar, LCSWA ?11/27/2021 @ 5:58 PM ? ?

## 2021-11-27 NOTE — Progress Notes (Signed)
CSW submitted additional lab information to [facility] for review. 2nd shift CSW to follow-up.  ? ? ?Damita Dunnings, MSW, LCSW-A  ?1:46 PM 11/27/2021   ?

## 2021-11-27 NOTE — Progress Notes (Signed)
CSW faxed [facility] most recent CCA and provider notes.  ? ?Maryjean Ka, MSW, LCSWA ?11/27/2021 9:32 PM ? ? ?

## 2021-11-27 NOTE — ED Notes (Signed)
Pt changed in purple scrubs and belongings labeled in pt belonging bag in cabinet for pt belongings on Res side ?

## 2021-11-28 NOTE — ED Provider Notes (Signed)
Emergency Medicine Observation Re-evaluation Note ? ?Gabrielle Nguyen is a 26 y.o. female, seen on rounds today.  Pt initially presented to the ED for complaints of Head Laceration, Agitation, and Suicidal ?Currently, the patient is resting, awaiting placement at [facility]. ? ?Physical Exam  ?BP (!) 90/56 (BP Location: Left Arm)   Pulse (!) 57   Temp (!) 97.5 ?F (36.4 ?C) (Oral)   Resp 16   Ht 5\' 6"  (1.676 m)   Wt 58.1 kg   SpO2 99%   BMI 20.66 kg/m?  ? ?General: Calm, no distress ?Cardiac: Warm well perfused ?Lungs: Even unlabored ?Psych: Calm ? ?ED Course / MDM  ?EKG:EKG Interpretation ? ?Date/Time:  Tuesday November 27 2021 18:41:25 EDT ?Ventricular Rate:  75 ?PR Interval:  156 ?QRS Duration: 96 ?QT Interval:  424 ?QTC Calculation: 473 ?R Axis:   97 ?Text Interpretation: Normal sinus rhythm Rightward axis Borderline ECG When compared with ECG of 15-Jul-2021 11:38, rate is slower. Confirmed by Aletta Edouard [phone]) on 11/27/2021 6:46:11 PM ? ?I have reviewed the labs performed to date as well as medications administered while in observation.  Recent changes in the last 24 hours include placement found at holding urine. ? ?Plan  ?Current plan is for transfer to [facility] ?I have completed the provider portion of EMTALA, Dr. Kathlene Cote accepting at [facility]. ?. ?Tatisha Fakhouri is under involuntary commitment. ?  ? ?  ?Lucrezia Starch, MD ?11/28/21 1010 ? ?

## 2021-11-28 NOTE — ED Notes (Addendum)
[facility]iff[facility] dpt contacted for transport to [facility]. ?ETA: within the hour ?

## 2021-11-29 ENCOUNTER — Encounter (HOSPITAL_COMMUNITY): Payer: Medicaid Other | Admitting: Psychiatry

## 2021-12-07 ENCOUNTER — Telehealth (HOSPITAL_COMMUNITY): Payer: Self-pay | Admitting: Psychiatry

## 2021-12-07 ENCOUNTER — Other Ambulatory Visit (HOSPITAL_COMMUNITY): Payer: Self-pay | Admitting: Psychiatry

## 2021-12-07 DIAGNOSIS — F411 Generalized anxiety disorder: Secondary | ICD-10-CM

## 2021-12-07 MED ORDER — CLONAZEPAM 1 MG PO TABS: 1.0000 mg | ORAL_TABLET | Freq: Two times a day (BID) | ORAL | 0 refills | Status: DC

## 2021-12-07 NOTE — Telephone Encounter (Signed)
Next appt after hospitalization is 12/27/21.  Please refill clonazepam per pt request.

## 2021-12-07 NOTE — Telephone Encounter (Signed)
Clonazepam 1 mg twice daily , 60 pills with no refill sent to [facility] [address] ?

## 2021-12-27 ENCOUNTER — Ambulatory Visit (INDEPENDENT_AMBULATORY_CARE_PROVIDER_SITE_OTHER): Payer: Medicaid Other | Admitting: Psychiatry

## 2021-12-27 VITALS — BP 100/71 | HR 84 | Resp 18 | Ht 65.5 in | Wt 114.0 lb

## 2021-12-27 DIAGNOSIS — F411 Generalized anxiety disorder: Secondary | ICD-10-CM

## 2021-12-27 DIAGNOSIS — F410 Panic disorder [episodic paroxysmal anxiety] without agoraphobia: Secondary | ICD-10-CM | POA: Diagnosis not present

## 2021-12-27 DIAGNOSIS — F331 Major depressive disorder, recurrent, moderate: Secondary | ICD-10-CM

## 2021-12-27 DIAGNOSIS — F4310 Post-traumatic stress disorder, unspecified: Secondary | ICD-10-CM

## 2021-12-27 DIAGNOSIS — F418 Other specified anxiety disorders: Secondary | ICD-10-CM | POA: Diagnosis not present

## 2021-12-27 MED ORDER — SERTRALINE HCL 100 MG PO TABS: 200.0000 mg | ORAL_TABLET | Freq: Every day | ORAL | 2 refills | Status: DC

## 2021-12-27 MED ORDER — OXCARBAZEPINE 300 MG PO TABS: 300.0000 mg | ORAL_TABLET | Freq: Two times a day (BID) | ORAL | 2 refills | Status: DC

## 2021-12-27 MED ORDER — CLONAZEPAM 1 MG PO TABS: 1.0000 mg | ORAL_TABLET | Freq: Two times a day (BID) | ORAL | 0 refills | Status: DC

## 2021-12-27 NOTE — Patient Instructions (Signed)
Follow-up in 4 weeks
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
## 2022-01-22 ENCOUNTER — Other Ambulatory Visit (HOSPITAL_COMMUNITY): Payer: Self-pay | Admitting: *Deleted

## 2022-01-22 ENCOUNTER — Telehealth (HOSPITAL_COMMUNITY): Payer: Self-pay | Admitting: *Deleted

## 2022-01-22 ENCOUNTER — Telehealth (HOSPITAL_COMMUNITY): Payer: Self-pay | Admitting: Psychiatry

## 2022-01-22 NOTE — Telephone Encounter (Signed)
Call from DSS social worker asking to speak with Dr Leone Haven re this patient. She is not in the office today as I know, but I will forward the message and ask her to call Janice Coffin on her work cell number which is [phone].

## 2022-01-22 NOTE — Telephone Encounter (Signed)
Got a message that DSS social worker wants to speak to me about Pt.  Called Pt to get a consent to talk to DSS social worker. Pt refused to give consent at this time. Pt is worried that it may affect her case negatively. Pt wants to discuss this at next visit before making any decision.   Karsten Ro MD PGY2

## 2022-01-24 ENCOUNTER — Telehealth (HOSPITAL_COMMUNITY): Payer: Self-pay | Admitting: Psychiatry

## 2022-01-24 ENCOUNTER — Ambulatory Visit (INDEPENDENT_AMBULATORY_CARE_PROVIDER_SITE_OTHER): Payer: Medicaid Other | Admitting: Psychiatry

## 2022-01-24 DIAGNOSIS — F4310 Post-traumatic stress disorder, unspecified: Secondary | ICD-10-CM

## 2022-01-24 DIAGNOSIS — F411 Generalized anxiety disorder: Secondary | ICD-10-CM | POA: Diagnosis not present

## 2022-01-24 DIAGNOSIS — F410 Panic disorder [episodic paroxysmal anxiety] without agoraphobia: Secondary | ICD-10-CM

## 2022-01-24 DIAGNOSIS — F331 Major depressive disorder, recurrent, moderate: Secondary | ICD-10-CM | POA: Diagnosis not present

## 2022-01-24 MED ORDER — CLONAZEPAM 1 MG PO TABS: 0.5000 mg | ORAL_TABLET | Freq: Two times a day (BID) | ORAL | 0 refills | Status: DC | PRN
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
## 2022-01-24 NOTE — Telephone Encounter (Cosign Needed)
Got a request to speak and release of information request per [place] statue 7B-302 from DSS CSW Ms. Viviann Spare regarding this patient.  Pt gave consent to talk to DSS CSW and signed a ROI for DSS.  Called Ms. Viviann Spare at [phone] who confirmed with me Gabrielle Nguyen's current diagnosis, medications and compliance with treatment.  She is requesting patient's most recent progress note and initial comprehensive evaluation.   Discussed with Dr. Dwyane Dee.  She suggested to send medical record request to hospital attorney Leveda Anna Knoxx.   Dr Armando Reichert PGY2 [facility]

## 2022-01-27 ENCOUNTER — Other Ambulatory Visit: Payer: Self-pay

## 2022-01-27 ENCOUNTER — Encounter: Payer: Self-pay | Admitting: Family Medicine

## 2022-01-27 ENCOUNTER — Other Ambulatory Visit: Payer: Self-pay | Admitting: Internal Medicine

## 2022-01-27 MED ORDER — ALBUTEROL SULFATE HFA 108 (90 BASE) MCG/ACT IN AERS: 2.0000 | INHALATION_SPRAY | Freq: Four times a day (QID) | RESPIRATORY_TRACT | 2 refills | Status: DC | PRN

## 2022-01-29 ENCOUNTER — Other Ambulatory Visit (HOSPITAL_COMMUNITY): Payer: Self-pay | Admitting: *Deleted

## 2022-01-29 ENCOUNTER — Telehealth (HOSPITAL_COMMUNITY): Payer: Self-pay | Admitting: *Deleted

## 2022-01-29 ENCOUNTER — Other Ambulatory Visit (HOSPITAL_COMMUNITY): Payer: Self-pay | Admitting: Psychiatry

## 2022-01-29 DIAGNOSIS — F331 Major depressive disorder, recurrent, moderate: Secondary | ICD-10-CM

## 2022-01-29 MED ORDER — SERTRALINE HCL 100 MG PO TABS: 200.0000 mg | ORAL_TABLET | Freq: Every day | ORAL | 2 refills | Status: DC
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
## 2022-01-30 ENCOUNTER — Telehealth (HOSPITAL_COMMUNITY): Payer: Self-pay | Admitting: *Deleted
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
## 2022-01-31 ENCOUNTER — Telehealth (HOSPITAL_COMMUNITY): Payer: Self-pay | Admitting: Psychiatry

## 2022-01-31 ENCOUNTER — Telehealth (HOSPITAL_COMMUNITY): Payer: Self-pay | Admitting: *Deleted
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
## 2022-02-21 ENCOUNTER — Ambulatory Visit (INDEPENDENT_AMBULATORY_CARE_PROVIDER_SITE_OTHER): Payer: Medicaid Other | Admitting: Psychiatry

## 2022-02-21 ENCOUNTER — Encounter (HOSPITAL_COMMUNITY): Payer: Self-pay | Admitting: Psychiatry

## 2022-02-21 VITALS — BP 102/82 | HR 93 | Ht 65.5 in | Wt 114.0 lb

## 2022-02-21 DIAGNOSIS — F411 Generalized anxiety disorder: Secondary | ICD-10-CM

## 2022-02-21 DIAGNOSIS — F331 Major depressive disorder, recurrent, moderate: Secondary | ICD-10-CM

## 2022-02-21 DIAGNOSIS — F4310 Post-traumatic stress disorder, unspecified: Secondary | ICD-10-CM

## 2022-02-21 DIAGNOSIS — F410 Panic disorder [episodic paroxysmal anxiety] without agoraphobia: Secondary | ICD-10-CM

## 2022-02-21 MED ORDER — CLONAZEPAM 1 MG PO TABS: 0.5000 mg | ORAL_TABLET | Freq: Two times a day (BID) | ORAL | 0 refills | Status: DC | PRN

## 2022-02-21 MED ORDER — MIRTAZAPINE 15 MG PO TABS: 7.5000 mg | ORAL_TABLET | Freq: Every day | ORAL | 2 refills | Status: DC

## 2022-02-21 NOTE — Patient Instructions (Signed)
Follow-up in 2 weeks
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
## 2022-03-08 ENCOUNTER — Other Ambulatory Visit (HOSPITAL_COMMUNITY): Payer: Self-pay | Admitting: Psychiatry

## 2022-03-08 DIAGNOSIS — F411 Generalized anxiety disorder: Secondary | ICD-10-CM

## 2022-03-08 MED ORDER — CLONAZEPAM 1 MG PO TABS: 0.5000 mg | ORAL_TABLET | Freq: Two times a day (BID) | ORAL | 0 refills | Status: DC | PRN

## 2022-03-12 ENCOUNTER — Ambulatory Visit (INDEPENDENT_AMBULATORY_CARE_PROVIDER_SITE_OTHER): Payer: Medicaid Other | Admitting: Psychiatry

## 2022-03-12 DIAGNOSIS — F418 Other specified anxiety disorders: Secondary | ICD-10-CM | POA: Diagnosis not present

## 2022-03-12 DIAGNOSIS — F411 Generalized anxiety disorder: Secondary | ICD-10-CM

## 2022-03-12 DIAGNOSIS — F4310 Post-traumatic stress disorder, unspecified: Secondary | ICD-10-CM | POA: Diagnosis not present

## 2022-03-12 DIAGNOSIS — F324 Major depressive disorder, single episode, in partial remission: Secondary | ICD-10-CM

## 2022-03-12 MED ORDER — CLONAZEPAM 1 MG PO TABS: 0.5000 mg | ORAL_TABLET | Freq: Two times a day (BID) | ORAL | 0 refills | Status: DC | PRN

## 2022-03-12 NOTE — Patient Instructions (Signed)
Follow-up in 3 weeks
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
## 2022-03-25 ENCOUNTER — Encounter (HOSPITAL_COMMUNITY): Payer: Medicaid Other | Admitting: Psychiatry

## 2022-04-05 ENCOUNTER — Encounter (HOSPITAL_COMMUNITY): Payer: Medicaid Other | Admitting: Psychiatry

## 2022-04-05 ENCOUNTER — Telehealth (HOSPITAL_COMMUNITY): Payer: Self-pay | Admitting: *Deleted

## 2022-04-05 ENCOUNTER — Other Ambulatory Visit (HOSPITAL_COMMUNITY): Payer: Self-pay | Admitting: Psychiatry

## 2022-04-05 DIAGNOSIS — F411 Generalized anxiety disorder: Secondary | ICD-10-CM

## 2022-04-05 MED ORDER — CLONAZEPAM 1 MG PO TABS: 0.5000 mg | ORAL_TABLET | Freq: Two times a day (BID) | ORAL | 0 refills | Status: DC | PRN

## 2022-04-05 NOTE — Telephone Encounter (Signed)
VM from patient after missing her appt this am with Dr Leone Haven. States this was her refill appt and really needs her meds. She is tearful sounding on the VM. Will forward this concern to the provider. Checked with front desk staff for drs availability and she can be seen Monday at 130 by dr doda. Will check to see how dr would like to proceed.

## 2022-04-05 NOTE — Telephone Encounter (Signed)
Called patient back after consulting with Dr Leone Haven, she will send in a short rx for her till she can come in on Tues 8/8th at 130. She expressed her appreciation.

## 2022-04-06 ENCOUNTER — Telehealth: Payer: Self-pay | Admitting: Cardiology

## 2022-04-06 NOTE — Telephone Encounter (Signed)
Patient called complaining that she is having severe asthma exacerbation and cannot breath.  She is actively wheezing on the phone.  I told her that she needs to go to the ER immediately but she is refusing to go.  She tells me that she cannot leave her 2 small children that they cannot be out of her sight due to a custody battle.  She is there with her boyfriend and I encouraged him to take her to the ER but she adamantly refuses to go.  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  She has a hx of endometriosis and 2 years ago prescribed Roxicet. Once roxicet was not longer prescribed she had withdrawal and started buying drugs off the street.  Started on methodone. She has only seen [facility]  02/02/22 when she was [redacted] weeks pregnant and was admitted with hyperemesis and was dx with Takotsubo DCM and CHF.  Repeat echo showed EF 35-40% She was seen back once by [facility] 02/19/21 and repeat echo 11/22 with EF 55-60%.  She tells me she has had asthma since childhood but has not needed an albuterol inhaler until last May when apparently she called in and talked to the Cardiology Fellow on Call, Dr. Welton Flakes who called a Rx in for Albuterol but no refills.  She is now asking for another refill.  She tells me that she does not have a primary MD because hers left a year ago but in review of Epic and Care Everywhere she has not been established with a PCP since 2020.  She says that she is going to get in with [facility] this week.    I have told her that I will call in a Rx [facility] Al[facility] on [address] with No Refills.  She will have to establish with a PCP for further treatment of non cardiac issues such as asthma.  I also told her that my recommendation is to go to the ER now which she adamantly refuses.

## 2022-04-10 ENCOUNTER — Encounter (HOSPITAL_COMMUNITY): Payer: Medicaid Other | Admitting: Psychiatry
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
## 2022-05-15 ENCOUNTER — Encounter: Payer: Self-pay | Admitting: Family Medicine

## 2022-06-23 ENCOUNTER — Inpatient Hospital Stay (HOSPITAL_COMMUNITY)

## 2022-06-23 ENCOUNTER — Inpatient Hospital Stay (HOSPITAL_COMMUNITY): Admission: AD | Admit: 2022-06-23 | Discharge: 2022-06-23 | Attending: Family Medicine | Admitting: Family Medicine

## 2022-06-23 ENCOUNTER — Encounter (HOSPITAL_COMMUNITY): Payer: Self-pay | Admitting: *Deleted

## 2022-06-23 DIAGNOSIS — N3000 Acute cystitis without hematuria: Secondary | ICD-10-CM | POA: Insufficient documentation

## 2022-06-23 DIAGNOSIS — E876 Hypokalemia: Secondary | ICD-10-CM | POA: Diagnosis not present

## 2022-06-23 DIAGNOSIS — D696 Thrombocytopenia, unspecified: Secondary | ICD-10-CM

## 2022-06-23 DIAGNOSIS — O021 Missed abortion: Secondary | ICD-10-CM | POA: Diagnosis present

## 2022-06-23 DIAGNOSIS — D6959 Other secondary thrombocytopenia: Secondary | ICD-10-CM | POA: Insufficient documentation

## 2022-06-23 DIAGNOSIS — O99011 Anemia complicating pregnancy, first trimester: Secondary | ICD-10-CM

## 2022-06-23 LAB — COMPREHENSIVE METABOLIC PANEL
ALT: 11 U/L (ref 0–44)
AST: 15 U/L (ref 15–41)
Albumin: 3.9 g/dL (ref 3.5–5.0)
Alkaline Phosphatase: 39 U/L (ref 38–126)
Anion gap: 9 (ref 5–15)
BUN: 7 mg/dL (ref 6–20)
CO2: 23 mmol/L (ref 22–32)
Calcium: 9.4 mg/dL (ref 8.9–10.3)
Chloride: 102 mmol/L (ref 98–111)
Creatinine, Ser: 0.56 mg/dL (ref 0.44–1.00)
GFR, Estimated: >60 mL/min (ref >60)
Glucose, Bld: 78 mg/dL (ref 70–99)
Potassium: 3.2 mmol/L — ABNORMAL LOW (ref 3.5–5.1)
Sodium: 134 mmol/L — ABNORMAL LOW (ref 135–145)
Total Bilirubin: 0.4 mg/dL (ref 0.3–1.2)
Total Protein: 6.7 g/dL (ref 6.5–8.1)

## 2022-06-23 LAB — URINALYSIS, ROUTINE W REFLEX MICROSCOPIC
Bilirubin Urine: NEGATIVE
Glucose, UA: NEGATIVE mg/dL
Ketones, ur: NEGATIVE mg/dL
Nitrite: POSITIVE — AB
Protein, ur: NEGATIVE mg/dL
Specific Gravity, Urine: >1.03 — ABNORMAL HIGH (ref 1.005–1.030)
pH: 7 (ref 5.0–8.0)

## 2022-06-23 LAB — URINALYSIS, MICROSCOPIC (REFLEX): RBC / HPF: >50 RBC/hpf (ref 0–5)

## 2022-06-23 LAB — CBC
HCT: 29.2 % — ABNORMAL LOW (ref 36.0–46.0)
Hemoglobin: 9 g/dL — ABNORMAL LOW (ref 12.0–15.0)
MCH: 22.7 pg — ABNORMAL LOW (ref 26.0–34.0)
MCHC: 30.8 g/dL (ref 30.0–36.0)
MCV: 73.7 fL — ABNORMAL LOW (ref 80.0–100.0)
Platelets: 117 10*3/uL — ABNORMAL LOW (ref 150–400)
RBC: 3.96 MIL/uL (ref 3.87–5.11)
RDW: 17.3 % — ABNORMAL HIGH (ref 11.5–15.5)
WBC: 4.1 10*3/uL (ref 4.0–10.5)
nRBC: 0 % (ref 0.0–0.2)

## 2022-06-23 LAB — WET PREP, GENITAL
Clue Cells Wet Prep HPF POC: NONE SEEN
Sperm: NONE SEEN
Trich, Wet Prep: NONE SEEN
WBC, Wet Prep HPF POC: >=10 — AB (ref <10)
Yeast Wet Prep HPF POC: NONE SEEN

## 2022-06-23 LAB — ABO/RH: ABO/RH(D): A POS

## 2022-06-23 LAB — HCG, QUANTITATIVE, PREGNANCY: hCG, Beta Chain, Quant, S: 14592 m[IU]/mL — ABNORMAL HIGH (ref <5)

## 2022-06-23 LAB — POCT PREGNANCY, URINE: Preg Test, Ur: POSITIVE — AB

## 2022-06-23 MED ORDER — POTASSIUM CHLORIDE CRYS ER 20 MEQ PO TBCR: 40.0000 meq | EXTENDED_RELEASE_TABLET | Freq: Every day | ORAL | 0 refills | Status: DC

## 2022-06-23 MED ORDER — LACTATED RINGERS IV BOLUS
1000.0000 mL | Freq: Once | INTRAVENOUS | Status: AC
  Administered 2022-06-23: 1000 mL via INTRAVENOUS

## 2022-06-23 MED ORDER — CEPHALEXIN 500 MG PO CAPS: 500.0000 mg | ORAL_CAPSULE | Freq: Two times a day (BID) | ORAL | 0 refills | Status: AC
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
## 2022-06-23 NOTE — MAU Note (Signed)
.  Gabrielle Nguyen is a 26 y.o. at Unknown here in MAU reporting: EMS arrival. (Pt incarcerated accompanied by [employer] ). Stated she started having some cramping last night. Noticed some spotting this morning . Went to see nurse at the jail and doppler the FHR 220 then went down to 160. EMS called to have pt evaluated. Pt also c/o n/v not able to keep much of anything down. Stated she feels very dehydrated.  LMP: 03/30/22  Onset of complaint: last night Pain score: 2  There were no vitals filed for this visit.   FHT:n/a Lab orders placed from triage:

## 2022-06-23 NOTE — Discharge Instructions (Signed)
Taking antibiotic for urinary tract infection for a total of 7 days.

## 2022-06-24 LAB — GC/CHLAMYDIA PROBE AMP (~~LOC~~) NOT AT ARMC
Chlamydia: NEGATIVE
Comment: NEGATIVE
Comment: NORMAL
Neisseria Gonorrhea: NEGATIVE

## 2022-06-25 ENCOUNTER — Encounter (HOSPITAL_BASED_OUTPATIENT_CLINIC_OR_DEPARTMENT_OTHER): Payer: Self-pay | Admitting: Obstetrics and Gynecology

## 2022-06-25 ENCOUNTER — Other Ambulatory Visit: Payer: Self-pay | Admitting: Obstetrics and Gynecology

## 2022-06-25 DIAGNOSIS — O039 Complete or unspecified spontaneous abortion without complication: Secondary | ICD-10-CM

## 2022-06-25 MED ORDER — DOXYCYCLINE HYCLATE 100 MG IV SOLR: 200.0000 mg | Freq: Once | INTRAVENOUS | Status: DC

## 2022-06-25 NOTE — Progress Notes (Addendum)
Spoke w/ via phone for pre-op interview---Multiple attempts to reach patient and nurse unsuccessful due to patient being incarcerated. I left a message with instructions for surgery @ [phone] [facility]). Lab needs dos---- cbc, type & screen, bmp (Ask MDA if drug screen is needed)              Lab results------06/23/22 labwork in Epic, 11/27/21 EKG in chart & Epic, 07/15/22 Echo in [place] test -----patient states asymptomatic no test needed Arrive at -------1215 on Wednesday, 06/26/22 NPO after MN NO Solid Food.  Clear liquids from MN until---1115 Med rec completed Medications to take morning of surgery -----Albuterol inhaler, Clonazepam prn, Methadone Diabetic medication -----n/a Patient instructed no nail polish to be worn day of surgery Patient instructed to bring photo id and insurance card day of surgery Patient aware to have Driver (ride ) / caregiver    for 24 hours after surgery  Patient Special Instructions -----none Pre-Op special Istructions -----none Patient verbalized understanding of instructions that were given at this phone interview. Patient denies shortness of breath, chest pain, fever, cough at this phone interview.   Patient has a hx of severe anxiety, depression, drug abuse, methadone withdrawal, seizures, and Takotsubo cardiomyopathy. Most recent echo dated 07/15/21 in [place] showed LVEF of 55 - 60%. Thus far I have been unable to reach anyone to verify/ update patient's history.Reviewed case with Dr. Kalman Shan, MDA. Ok to proceed with surgery per Dr. Kalman Shan.  Addendum: 06/25/22 [place], Web designer at [facility] returned my phone call.  She said a nurse was not available to speak with me. I reviewed instructions for surgery with her. I was still unable to update patient's history.

## 2022-06-26 ENCOUNTER — Other Ambulatory Visit: Payer: Self-pay

## 2022-06-26 ENCOUNTER — Encounter (HOSPITAL_BASED_OUTPATIENT_CLINIC_OR_DEPARTMENT_OTHER): Admission: RE | Payer: Self-pay | Source: Home / Self Care | Attending: Obstetrics and Gynecology

## 2022-06-26 ENCOUNTER — Encounter (HOSPITAL_BASED_OUTPATIENT_CLINIC_OR_DEPARTMENT_OTHER): Payer: Self-pay | Admitting: Obstetrics and Gynecology

## 2022-06-26 ENCOUNTER — Ambulatory Visit (HOSPITAL_BASED_OUTPATIENT_CLINIC_OR_DEPARTMENT_OTHER)
Admission: RE | Admit: 2022-06-26 | Discharge: 2022-06-26 | Attending: Obstetrics and Gynecology | Admitting: Obstetrics and Gynecology

## 2022-06-26 ENCOUNTER — Ambulatory Visit (HOSPITAL_BASED_OUTPATIENT_CLINIC_OR_DEPARTMENT_OTHER): Admitting: Anesthesiology

## 2022-06-26 DIAGNOSIS — Z9889 Other specified postprocedural states: Secondary | ICD-10-CM

## 2022-06-26 DIAGNOSIS — I509 Heart failure, unspecified: Secondary | ICD-10-CM | POA: Insufficient documentation

## 2022-06-26 DIAGNOSIS — O99511 Diseases of the respiratory system complicating pregnancy, first trimester: Secondary | ICD-10-CM | POA: Diagnosis not present

## 2022-06-26 DIAGNOSIS — O99011 Anemia complicating pregnancy, first trimester: Secondary | ICD-10-CM

## 2022-06-26 DIAGNOSIS — Z3A08 8 weeks gestation of pregnancy: Secondary | ICD-10-CM

## 2022-06-26 DIAGNOSIS — J45909 Unspecified asthma, uncomplicated: Secondary | ICD-10-CM

## 2022-06-26 DIAGNOSIS — F32A Depression, unspecified: Secondary | ICD-10-CM | POA: Insufficient documentation

## 2022-06-26 DIAGNOSIS — O034 Incomplete spontaneous abortion without complication: Secondary | ICD-10-CM | POA: Insufficient documentation

## 2022-06-26 DIAGNOSIS — F419 Anxiety disorder, unspecified: Secondary | ICD-10-CM | POA: Diagnosis not present

## 2022-06-26 DIAGNOSIS — O039 Complete or unspecified spontaneous abortion without complication: Secondary | ICD-10-CM

## 2022-06-26 DIAGNOSIS — D649 Anemia, unspecified: Secondary | ICD-10-CM

## 2022-06-26 DIAGNOSIS — O021 Missed abortion: Secondary | ICD-10-CM

## 2022-06-26 DIAGNOSIS — Z01818 Encounter for other preprocedural examination: Secondary | ICD-10-CM

## 2022-06-26 HISTORY — DX: Other psychoactive substance abuse, uncomplicated: F19.10

## 2022-06-26 HISTORY — PX: DILATION AND EVACUATION: SHX1459

## 2022-06-26 LAB — CBC
HCT: 32 % — ABNORMAL LOW (ref 36.0–46.0)
Hemoglobin: 9.4 g/dL — ABNORMAL LOW (ref 12.0–15.0)
MCH: 22.4 pg — ABNORMAL LOW (ref 26.0–34.0)
MCHC: 29.4 g/dL — ABNORMAL LOW (ref 30.0–36.0)
MCV: 76.2 fL — ABNORMAL LOW (ref 80.0–100.0)
Platelets: 126 10*3/uL — ABNORMAL LOW (ref 150–400)
RBC: 4.2 MIL/uL (ref 3.87–5.11)
RDW: 17.9 % — ABNORMAL HIGH (ref 11.5–15.5)
WBC: 5 10*3/uL (ref 4.0–10.5)
nRBC: 0 % (ref 0.0–0.2)

## 2022-06-26 LAB — BASIC METABOLIC PANEL
Anion gap: 8 (ref 5–15)
BUN: 8 mg/dL (ref 6–20)
CO2: 23 mmol/L (ref 22–32)
Calcium: 9.1 mg/dL (ref 8.9–10.3)
Chloride: 104 mmol/L (ref 98–111)
Creatinine, Ser: 0.53 mg/dL (ref 0.44–1.00)
GFR, Estimated: >60 mL/min (ref >60)
Glucose, Bld: 75 mg/dL (ref 70–99)
Potassium: 3.6 mmol/L (ref 3.5–5.1)
Sodium: 135 mmol/L (ref 135–145)

## 2022-06-26 LAB — TYPE AND SCREEN
ABO/RH(D): A POS
Antibody Screen: NEGATIVE

## 2022-06-26 SURGERY — DILATION AND EVACUATION, UTERUS
Anesthesia: General | Site: Uterus

## 2022-06-26 MED ORDER — LACTATED RINGERS IV SOLN: INTRAVENOUS | Status: DC

## 2022-06-26 MED ORDER — DEXAMETHASONE SODIUM PHOSPHATE 10 MG/ML IJ SOLN
INTRAMUSCULAR | Status: DC | PRN
  Administered 2022-06-26: 10 mg via INTRAVENOUS

## 2022-06-26 MED ORDER — MIDAZOLAM HCL 2 MG/2ML IJ SOLN
INTRAMUSCULAR | Status: AC
  Filled 2022-06-26: qty 2

## 2022-06-26 MED ORDER — PROPOFOL 10 MG/ML IV BOLUS
INTRAVENOUS | Status: AC
  Filled 2022-06-26: qty 20

## 2022-06-26 MED ORDER — LIDOCAINE HCL (PF) 2 % IJ SOLN
INTRAMUSCULAR | Status: AC
  Filled 2022-06-26: qty 5

## 2022-06-26 MED ORDER — EPHEDRINE 5 MG/ML INJ
INTRAVENOUS | Status: AC
  Filled 2022-06-26: qty 10

## 2022-06-26 MED ORDER — DEXAMETHASONE SODIUM PHOSPHATE 10 MG/ML IJ SOLN
INTRAMUSCULAR | Status: AC
  Filled 2022-06-26: qty 1

## 2022-06-26 MED ORDER — DOCUSATE SODIUM 100 MG PO CAPS: 100.0000 mg | ORAL_CAPSULE | Freq: Two times a day (BID) | ORAL | 2 refills | Status: AC

## 2022-06-26 MED ORDER — LORAZEPAM 2 MG/ML IJ SOLN
0.5000 mg | Freq: Once | INTRAMUSCULAR | Status: AC
  Administered 2022-06-26: 0.5 mg via INTRAVENOUS
  Filled 2022-06-26: qty 0.25

## 2022-06-26 MED ORDER — FENTANYL CITRATE (PF) 100 MCG/2ML IJ SOLN
INTRAMUSCULAR | Status: DC | PRN
  Administered 2022-06-26: 100 ug via INTRAVENOUS

## 2022-06-26 MED ORDER — SILVER NITRATE-POT NITRATE 75-25 % EX MISC
CUTANEOUS | Status: DC | PRN
  Administered 2022-06-26: 4

## 2022-06-26 MED ORDER — LIDOCAINE 2% (20 MG/ML) 5 ML SYRINGE
INTRAMUSCULAR | Status: DC | PRN
  Administered 2022-06-26: 60 mg via INTRAVENOUS

## 2022-06-26 MED ORDER — DOCUSATE SODIUM 100 MG PO CAPS: 100.0000 mg | ORAL_CAPSULE | Freq: Two times a day (BID) | ORAL | 2 refills | Status: DC

## 2022-06-26 MED ORDER — FERROUS SULFATE 325 (65 FE) MG PO TABS: 325.0000 mg | ORAL_TABLET | ORAL | 2 refills | Status: DC

## 2022-06-26 MED ORDER — FENTANYL CITRATE (PF) 100 MCG/2ML IJ SOLN
INTRAMUSCULAR | Status: AC
  Filled 2022-06-26: qty 2

## 2022-06-26 MED ORDER — EPHEDRINE SULFATE-NACL 50-0.9 MG/10ML-% IV SOSY
PREFILLED_SYRINGE | INTRAVENOUS | Status: DC | PRN
  Administered 2022-06-26 (×4): 10 mg via INTRAVENOUS

## 2022-06-26 MED ORDER — DOXYCYCLINE HYCLATE 100 MG IV SOLR
200.0000 mg | Freq: Once | INTRAVENOUS | Status: AC
  Administered 2022-06-26: 200 mg via INTRAVENOUS
  Filled 2022-06-26: qty 100

## 2022-06-26 MED ORDER — TRANEXAMIC ACID-NACL 1000-0.7 MG/100ML-% IV SOLN
INTRAVENOUS | Status: DC | PRN
  Administered 2022-06-26: 1000 mg via INTRAVENOUS

## 2022-06-26 MED ORDER — ACETAMINOPHEN 500 MG PO TABS
ORAL_TABLET | ORAL | Status: AC
  Filled 2022-06-26: qty 2

## 2022-06-26 MED ORDER — METOPROLOL TARTRATE 25 MG PO TABS
ORAL_TABLET | ORAL | Status: AC
  Filled 2022-06-26: qty 1

## 2022-06-26 MED ORDER — MIDAZOLAM HCL 5 MG/5ML IJ SOLN
INTRAMUSCULAR | Status: DC | PRN
  Administered 2022-06-26: 2 mg via INTRAVENOUS

## 2022-06-26 MED ORDER — SODIUM CHLORIDE 0.9 % IV SOLN: INTRAVENOUS | Status: DC

## 2022-06-26 MED ORDER — DEXMEDETOMIDINE HCL IN NACL 200 MCG/50ML IV SOLN
INTRAVENOUS | Status: DC | PRN
  Administered 2022-06-26: 8 ug via INTRAVENOUS
  Administered 2022-06-26: 12 ug via INTRAVENOUS

## 2022-06-26 MED ORDER — LIDOCAINE HCL 1 % IJ SOLN
INTRAMUSCULAR | Status: DC | PRN
  Administered 2022-06-26: 20 mL

## 2022-06-26 MED ORDER — FENTANYL CITRATE (PF) 100 MCG/2ML IJ SOLN
25.0000 ug | INTRAMUSCULAR | Status: DC | PRN
  Administered 2022-06-26 (×3): 50 ug via INTRAVENOUS

## 2022-06-26 MED ORDER — HEATING PADS PADS: 1.0000 | MEDICATED_PAD | Freq: Three times a day (TID) | 0 refills | Status: AC | PRN

## 2022-06-26 MED ORDER — ACETAMINOPHEN 500 MG PO TABS
1000.0000 mg | ORAL_TABLET | Freq: Once | ORAL | Status: AC
  Administered 2022-06-26: 1000 mg via ORAL

## 2022-06-26 MED ORDER — IBUPROFEN 600 MG PO TABS: 600.0000 mg | ORAL_TABLET | Freq: Four times a day (QID) | ORAL | 0 refills | Status: DC

## 2022-06-26 MED ORDER — HEATING PADS PADS: 1.0000 | MEDICATED_PAD | Freq: Three times a day (TID) | 0 refills | Status: DC | PRN

## 2022-06-26 MED ORDER — PROPOFOL 10 MG/ML IV BOLUS
INTRAVENOUS | Status: DC | PRN
  Administered 2022-06-26: 30 mg via INTRAVENOUS
  Administered 2022-06-26: 140 mg via INTRAVENOUS
  Administered 2022-06-26: 40 mg via INTRAVENOUS

## 2022-06-26 MED ORDER — ONDANSETRON HCL 4 MG/2ML IJ SOLN
INTRAMUSCULAR | Status: AC
  Filled 2022-06-26: qty 2

## 2022-06-26 MED ORDER — METOPROLOL TARTRATE 25 MG PO TABS
12.5000 mg | ORAL_TABLET | Freq: Once | ORAL | Status: AC
  Administered 2022-06-26: 12.5 mg via ORAL

## 2022-06-26 MED ORDER — EPHEDRINE 5 MG/ML INJ
INTRAVENOUS | Status: AC
  Filled 2022-06-26: qty 5

## 2022-06-26 MED ORDER — IBUPROFEN 600 MG PO TABS: 600.0000 mg | ORAL_TABLET | Freq: Four times a day (QID) | ORAL | 0 refills | Status: AC

## 2022-06-26 MED ORDER — ONDANSETRON HCL 4 MG/2ML IJ SOLN
INTRAMUSCULAR | Status: DC | PRN
  Administered 2022-06-26: 4 mg via INTRAVENOUS

## 2022-06-26 MED ORDER — PHENYLEPHRINE 80 MCG/ML (10ML) SYRINGE FOR IV PUSH (FOR BLOOD PRESSURE SUPPORT)
PREFILLED_SYRINGE | INTRAVENOUS | Status: AC
  Filled 2022-06-26: qty 10

## 2022-06-26 MED ORDER — 0.9 % SODIUM CHLORIDE (POUR BTL) OPTIME
TOPICAL | Status: DC | PRN
  Administered 2022-06-26: 500 mL

## 2022-06-26 SURGICAL SUPPLY — 19 items
CATH ROBINSON RED A/P 16FR (CATHETERS) IMPLANT
DRSG TELFA 3X8 NADH STRL (GAUZE/BANDAGES/DRESSINGS) ×1 IMPLANT
GAUZE 4X4 16PLY ~~LOC~~+RFID DBL (SPONGE) ×1 IMPLANT
GOWN STRL REUS W/TWL LRG LVL3 (GOWN DISPOSABLE) ×1 IMPLANT
GOWN STRL REUS W/TWL XL LVL3 (GOWN DISPOSABLE) ×1 IMPLANT
KIT BERKELEY 1ST TRI 3/8 NO TR (MISCELLANEOUS) ×1 IMPLANT
KIT BERKELEY 1ST TRIMESTER 3/8 (MISCELLANEOUS) ×1 IMPLANT
KIT TURNOVER CYSTO (KITS) ×1 IMPLANT
NS IRRIG 1000ML POUR BTL (IV SOLUTION) ×1 IMPLANT
PACK VAGINAL MINOR WOMEN LF (CUSTOM PROCEDURE TRAY) ×1 IMPLANT
PAD OB MATERNITY 4.3X12.25 (PERSONAL CARE ITEMS) ×1 IMPLANT
PAD PREP 24X48 CUFFED NSTRL (MISCELLANEOUS) ×1 IMPLANT
SET BERKELEY SUCTION TUBING (SUCTIONS) ×1 IMPLANT
SPIKE FLUID TRANSFER (MISCELLANEOUS) ×1 IMPLANT
TOWEL OR 17X26 10 PK STRL BLUE (TOWEL DISPOSABLE) ×2 IMPLANT
VACURETTE 10 RIGID CVD (CANNULA) IMPLANT
VACURETTE 7MM CVD STRL WRAP (CANNULA) IMPLANT
VACURETTE 8 RIGID CVD (CANNULA) IMPLANT
VACURETTE 9 RIGID CVD (CANNULA) IMPLANT
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
## 2022-06-26 NOTE — Anesthesia Preprocedure Evaluation (Addendum)
Anesthesia Evaluation  Patient identified by MRN, date of birth, ID band Patient awake    Reviewed: Allergy & Precautions, H&P , NPO status , Patient's Chart, lab work & pertinent test results  Airway Mallampati: II  TM Distance: >3 FB Neck ROM: Full    Dental no notable dental hx. (+) Teeth Intact, Dental Advisory Given   Pulmonary asthma ,    Pulmonary exam normal breath sounds clear to auscultation       Cardiovascular +CHF   Rhythm:Regular Rate:Normal     Neuro/Psych Seizures -, Well Controlled,  Anxiety Depression    GI/Hepatic negative GI ROS, (+)     substance abuse  ,   Endo/Other  negative endocrine ROS  Renal/GU negative Renal ROS  negative genitourinary   Musculoskeletal   Abdominal   Peds  Hematology  (+) Blood dyscrasia, anemia ,   Anesthesia Other Findings   Reproductive/Obstetrics negative OB ROS                            Anesthesia Physical Anesthesia Plan  ASA: 3  Anesthesia Plan: General   Post-op Pain Management: Tylenol PO (pre-op)*, Toradol IV (intra-op)* and Precedex   Induction: Intravenous  PONV Risk Score and Plan: 4 or greater and Ondansetron, Dexamethasone and Midazolam  Airway Management Planned: LMA  Additional Equipment:   Intra-op Plan:   Post-operative Plan: Extubation in OR  Informed Consent: I have reviewed the patients History and Physical, chart, labs and discussed the procedure including the risks, benefits and alternatives for the proposed anesthesia with the patient or authorized representative who has indicated his/her understanding and acceptance.     Dental advisory given  Plan Discussed with: CRNA  Anesthesia Plan Comments:        Anesthesia Quick Evaluation

## 2022-06-26 NOTE — Transfer of Care (Signed)
Immediate Anesthesia Transfer of Care Note  Patient: Gabrielle Nguyen  Procedure(s) Performed: DILATATION AND EVACUATION (Uterus)  Patient Location: PACU  Anesthesia Type:General  Level of Consciousness: awake, alert , oriented and patient cooperative  Airway & Oxygen Therapy: Patient Spontanous Breathing  Post-op Assessment: Report given to RN and Post -op Vital signs reviewed and stable  Post vital signs: Reviewed and stable  Last Vitals:  Vitals Value Taken Time  BP 102/69 06/26/22 1500  Temp 36.5 C 06/26/22 1456  Pulse 103 06/26/22 1504  Resp 27 06/26/22 1504  SpO2 100 % 06/26/22 1504  Vitals shown include unvalidated device data.  Last Pain:  Vitals:   06/26/22 1239  TempSrc: Oral         Complications: No notable events documented.

## 2022-06-26 NOTE — Discharge Instructions (Addendum)
We will discuss your surgery once again in detail at your post-op visit in two to four weeks. If you haven't already done so, please call to make your appointment as soon as possible.  Dilation and Curettage or Vacuum Curettage, Care After These instructions give you information on caring for yourself after your procedure. Your doctor may also give you more specific instructions. Call your doctor if you have any problems or questions after your procedure. HOME CARE Do not drive for 24 hours. Wait 1 week before doing any activities that wear you out. Do not stand for a long time. Limit stair climbing to once or twice a day. Rest often. Continue with your usual diet. Drink enough fluids to keep your pee (urine) clear or pale yellow. If you have a hard time pooping (constipation), you may: Take a medicine to help you go poop (laxative) as told by your doctor. Eat more fruit and bran. Drink more fluids. Take showers, not baths, for as long as told by your doctor. Do not swim or use a hot tub until your doctor says it is okay. Have someone with you for 1day after the procedure. Do not douche, use tampons, or have sex (intercourse) until seen by your doctor Only take medicines as told by your doctor. Do not take aspirin. It can cause bleeding. Keep all doctor visits. GET HELP IF: You have cramps or pain not helped by medicine. You have new pain in the belly (abdomen). You have a bad smelling fluid coming from your vagina. You have a rash. You have problems with any medicine. GET HELP RIGHT AWAY IF:  You start to bleed more than a regular period. You have a fever. You have chest pain. You have trouble breathing. You feel dizzy or feel like passing out (fainting). You pass out. You have pain in the tops of your shoulders. You have vaginal bleeding with or without clumps of blood (blood clots). MAKE SURE YOU: Understand these instructions. Will watch your condition. Will get help  right away if you are not doing well or get worse. Document Released: 05/28/2008 Document Revised: 08/24/2013 Document Reviewed: 03/18/2013 [facility] Patient Information 2015 [place], [place]. This information is not intended to replace advice given to you by your health care provider. Make sure you discuss any questions you have with your health care provider.    Post Anesthesia Home Care Instructions  Activity: Get plenty of rest for the remainder of the day. A responsible individual must stay with you for 24 hours following the procedure.  For the next 24 hours, DO NOT: -Drive a car -Paediatric nurse -Drink alcoholic beverages -Take any medication unless instructed by your physician -Make any legal decisions or sign important papers.  Meals: Start with liquid foods such as gelatin or soup. Progress to regular foods as tolerated. Avoid greasy, spicy, heavy foods. If nausea and/or vomiting occur, drink only clear liquids until the nausea and/or vomiting subsides. Call your physician if vomiting continues.  Special Instructions/Symptoms: Your throat may feel dry or sore from the anesthesia or the breathing tube placed in your throat during surgery. If this causes discomfort, gargle with warm salt water. The discomfort should disappear within 24 hours.  If you had a scopolamine patch placed behind your ear for the management of post- operative nausea and/or vomiting:  1. The medication in the patch is effective for 72 hours, after which it should be removed.  Wrap patch in a tissue and discard in the trash. Wash  hands thoroughly with soap and water. 2. You may remove the patch earlier than 72 hours if you experience unpleasant side effects which may include dry mouth, dizziness or visual disturbances. 3. Avoid touching the patch. Wash your hands with soap and water after contact with the patch.     No acetaminophen/Tylenol until after 7:30 pm today if needed.

## 2022-06-26 NOTE — Op Note (Signed)
Operative Note   06/26/2022  PRE-OP DIAGNOSIS: Miscarriage at 8wks   POST-OP DIAGNOSIS: Incomplete miscarriage at 8wks  SURGEON: Surgeon(s) and Role:    * Aletha Halim, MD - Primary  ASSISTANT: none  PROCEDURE:  Suction dilation and curettage  ANESTHESIA: Monitor Anesthesia Care and paracervical block  ESTIMATED BLOOD LOSS: 363mL  DRAINS: I/O cath for 52mL UOP  TOTAL IV FLUIDS: per anesthesia note  SPECIMENS: products of conception to pathology  VTE PROPHYLAXIS: SCDs to the bilateral lower extremities  ANTIBIOTICS: Doxycycline 200mg  IV  COMPLICATIONS: none  DISPOSITION: PACU - hemodynamically stable.  CONDITION: stable  BLOOD TYPE: A POS. Rhogam given:not applicable  FINDINGS: During the prep, patient passed a large sac and tissue. This was examined and sac was intact with approximately 8wk sized baby in the sac. Suction d&c done with moderate amount of products of conception noted with gritty texture in all four quadrants at the end of the procedure.   PROCEDURE IN DETAIL:  After informed consent was obtained, the patient was taken to the operating room where anesthesia was obtained without difficulty. The patient was positioned in the dorsal lithotomy position in [place]. The patient was examined under anesthesia, with the above noted findings.  The bi-valved speculum was placed inside the patient's vagina, and the the anterior lip of the cervix was seen and grasped with the tenaculum.  A paracervical block was achieved with 19mL of 1% lidocaine and then the cervix was already dilated and passed a #10 cannula. The suction was then calibrated to 36mmHg and connected to the number 10 cannula, which was then introduced with the above noted findings. A gentle curettage was done at the end and yield no products of conception.   The suction was then done one more time to remove any remaining curettage material. Lysteda was given to the patient intraoperatively for  bleeding prophylaxis  Excellent hemostasis was noted, and all instruments were removed, with excellent hemostasis noted throughout.  She was then taken out of dorsal lithotomy. The patient tolerated the procedure well.  Sponge, lap and instrument counts were correct x2.  The patient was taken to recovery room in excellent condition.  Durene Romans MD Attending Center for [employer])

## 2022-06-26 NOTE — Progress Notes (Signed)
1.5mg  lorazepam wasted in stericycle; witnessed by C Seth Bake RN

## 2022-06-26 NOTE — Anesthesia Procedure Notes (Signed)
Procedure Name: LMA Insertion Date/Time: 06/26/2022 2:07 PM  Performed by: Rogers Blocker, CRNAPre-anesthesia Checklist: Patient identified, Emergency Drugs available, Suction available and Patient being monitored Patient Re-evaluated:Patient Re-evaluated prior to induction Oxygen Delivery Method: Circle System Utilized Preoxygenation: Pre-oxygenation with 100% oxygen Induction Type: IV induction Ventilation: Mask ventilation without difficulty LMA: LMA inserted LMA Size: 3.0 Number of attempts: 1 Placement Confirmation: positive ETCO2 Tube secured with: Tape Dental Injury: Teeth and Oropharynx as per pre-operative assessment

## 2022-06-27 ENCOUNTER — Encounter (HOSPITAL_BASED_OUTPATIENT_CLINIC_OR_DEPARTMENT_OTHER): Payer: Self-pay | Admitting: Obstetrics and Gynecology

## 2022-06-27 LAB — SURGICAL PATHOLOGY

## 2022-06-27 NOTE — Anesthesia Postprocedure Evaluation (Signed)
Anesthesia Post Note  Patient: Gabrielle Nguyen  Procedure(s) Performed: DILATATION AND EVACUATION (Uterus)     Patient location during evaluation: PACU Anesthesia Type: General Level of consciousness: awake and alert Pain management: pain level controlled Vital Signs Assessment: post-procedure vital signs reviewed and stable Respiratory status: spontaneous breathing, nonlabored ventilation and respiratory function stable Cardiovascular status: blood pressure returned to baseline and stable Postop Assessment: no apparent nausea or vomiting Anesthetic complications: no   No notable events documented.  Last Vitals:  Vitals:   06/26/22 1630 06/26/22 1653  BP: (!) 94/52 106/60  Pulse: 86 96  Resp: 16 20  Temp:  36.9 C  SpO2: 100% 100%    Last Pain:  Vitals:   06/26/22 1653  TempSrc:   PainSc: 5                  Ozie Lupe

## 2022-07-09 ENCOUNTER — Ambulatory Visit: Admitting: Family Medicine

## 2022-07-11 ENCOUNTER — Ambulatory Visit: Admitting: Obstetrics and Gynecology
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
## 2023-06-02 ENCOUNTER — Emergency Department (HOSPITAL_COMMUNITY): Payer: No Typology Code available for payment source

## 2023-06-02 ENCOUNTER — Emergency Department (HOSPITAL_COMMUNITY): Admit: 2023-06-02 | Payer: Self-pay

## 2023-06-02 ENCOUNTER — Emergency Department (HOSPITAL_COMMUNITY)
Admission: EM | Admit: 2023-06-02 | Discharge: 2023-06-03 | Disposition: A | Discharge status: Home / Self Care | Payer: No Typology Code available for payment source | Attending: Emergency Medicine | Admitting: Emergency Medicine

## 2023-06-02 DIAGNOSIS — R000 Tachycardia, unspecified: Secondary | ICD-10-CM | POA: Diagnosis present

## 2023-06-02 DIAGNOSIS — D72829 Elevated white blood cell count, unspecified: Secondary | ICD-10-CM | POA: Insufficient documentation

## 2023-06-02 DIAGNOSIS — Z139 Encounter for screening, unspecified: Secondary | ICD-10-CM | POA: Diagnosis not present

## 2023-06-02 DIAGNOSIS — R112 Nausea with vomiting, unspecified: Secondary | ICD-10-CM | POA: Diagnosis not present

## 2023-06-02 DIAGNOSIS — F1413 Cocaine abuse, unspecified with withdrawal: Secondary | ICD-10-CM | POA: Diagnosis not present

## 2023-06-02 LAB — COMPREHENSIVE METABOLIC PANEL
ALT: 14 U/L (ref 0–44)
AST: 16 U/L (ref 15–41)
Albumin: 4.6 g/dL (ref 3.5–5.0)
Alkaline Phosphatase: 41 U/L (ref 38–126)
Anion gap: 16 — ABNORMAL HIGH (ref 5–15)
BUN: 15 mg/dL (ref 6–20)
CO2: 18 mmol/L — ABNORMAL LOW (ref 22–32)
Calcium: 9.7 mg/dL (ref 8.9–10.3)
Chloride: 103 mmol/L (ref 98–111)
Creatinine, Ser: 0.68 mg/dL (ref 0.44–1.00)
GFR, Estimated: >60 mL/min (ref >60)
Glucose, Bld: 109 mg/dL — ABNORMAL HIGH (ref 70–99)
Potassium: 3.2 mmol/L — ABNORMAL LOW (ref 3.5–5.1)
Sodium: 137 mmol/L (ref 135–145)
Total Bilirubin: 0.6 mg/dL (ref 0.3–1.2)
Total Protein: 8.2 g/dL — ABNORMAL HIGH (ref 6.5–8.1)

## 2023-06-02 LAB — CBC WITH DIFFERENTIAL/PLATELET
Abs Immature Granulocytes: 0.03 10*3/uL (ref 0.00–0.07)
Basophils Absolute: 0 10*3/uL (ref 0.0–0.1)
Basophils Relative: 0 %
Eosinophils Absolute: 0 10*3/uL (ref 0.0–0.5)
Eosinophils Relative: 0 %
HCT: 39.8 % (ref 36.0–46.0)
Hemoglobin: 12.4 g/dL (ref 12.0–15.0)
Immature Granulocytes: 0 %
Lymphocytes Relative: 10 %
Lymphs Abs: 1.1 10*3/uL (ref 0.7–4.0)
MCH: 24.3 pg — ABNORMAL LOW (ref 26.0–34.0)
MCHC: 31.2 g/dL (ref 30.0–36.0)
MCV: 78 fL — ABNORMAL LOW (ref 80.0–100.0)
Monocytes Absolute: 0.5 10*3/uL (ref 0.1–1.0)
Monocytes Relative: 5 %
Neutro Abs: 9.2 10*3/uL — ABNORMAL HIGH (ref 1.7–7.7)
Neutrophils Relative %: 85 %
Platelets: 307 10*3/uL (ref 150–400)
RBC: 5.1 MIL/uL (ref 3.87–5.11)
RDW: 14.8 % (ref 11.5–15.5)
WBC: 10.9 10*3/uL — ABNORMAL HIGH (ref 4.0–10.5)
nRBC: 0 % (ref 0.0–0.2)

## 2023-06-02 LAB — CBG MONITORING, ED: Glucose-Capillary: 93 mg/dL (ref 70–99)

## 2023-06-02 LAB — ETHANOL: Alcohol, Ethyl (B): <10 mg/dL (ref <10)

## 2023-06-02 LAB — ACETAMINOPHEN LEVEL: Acetaminophen (Tylenol), Serum: <10 ug/mL — ABNORMAL LOW (ref 10–30)

## 2023-06-02 LAB — HCG, SERUM, QUALITATIVE: Preg, Serum: NEGATIVE

## 2023-06-02 LAB — SALICYLATE LEVEL: Salicylate Lvl: <7 mg/dL — ABNORMAL LOW (ref 7.0–30.0)

## 2023-06-02 MED ORDER — LACTATED RINGERS IV BOLUS
1000.0000 mL | Freq: Once | INTRAVENOUS | Status: AC
  Administered 2023-06-02: 1000 mL via INTRAVENOUS

## 2023-06-02 NOTE — ED Notes (Addendum)
EMS called due to patients presentation.  EMS arrived and took pt to the emergency department.

## 2023-06-02 NOTE — ED Provider Notes (Signed)
[place] EMERGENCY DEPARTMENT AT [facility] Provider Note   CSN: [id number] Arrival date & time: 06/02/23  2002     History  No chief complaint on file.   Floriene Jeschke is a 27 y.o. female.  HPI 27 year old female with poly-substance use history presenting for evaluation of abnormal behavior.  Patient reportedly went to behavioral health urgent care earlier today complaining of withdrawal symptoms.  She reportedly told them that she had taken Xanax.  When I interviewed patient, she was unable to provide any meaningful history.  She told me that she "does not feel good," but could not provide any specifics.  Unable to obtain any additional history.    Home Medications Prior to Admission medications   Medication Sig Start Date End Date Taking? Authorizing Provider  acetaminophen (TYLENOL) 500 MG tablet Take 2 tablets (1,000 mg total) by mouth every 6 (six) hours as needed. Patient taking differently: Take 1,000 mg by mouth every 6 (six) hours as needed for mild pain. 07/15/21   Rolm Bookbinder, CNM  albuterol (VENTOLIN HFA) 108 (90 Base) MCG/ACT inhaler Inhale 2 puffs into the lungs every 6 (six) hours as needed for wheezing or shortness of breath. 01/27/22   Hermelinda Dellen, MD  clonazePAM (KLONOPIN) 1 MG tablet Take 0.5 tablets (0.5 mg total) by mouth 2 (two) times daily as needed for anxiety. 04/05/22   Karsten Ro, MD  ferrous sulfate (FERROUSUL) 325 (65 FE) MG tablet Take 1 tablet (325 mg total) by mouth every other day. 06/26/22 09/24/22  Olney Bing, MD  methadone (DOLOPHINE) 10 MG/ML solution Take 175 mg by mouth daily. Verified with on call personnel for [place] [facility] [place]. 11/26/21    [provider]  metoprolol succinate (TOPROL-XL) 25 MG 24 hr tablet Take 0.5 tablets (12.5 mg total) by mouth 2 (two) times daily. 03/26/21   Lazaro Arms, MD  mirtazapine (REMERON) 15 MG tablet Take 0.5 tablets (7.5 mg total) by mouth at  bedtime. 02/21/22   Karsten Ro, MD  naloxone [facility]) 2 MG/2ML injection Place 2 mg into the nose as needed (overdose). 12/20/20   [provider]  ondansetron (ZOFRAN-ODT) 8 MG disintegrating tablet Take 1 tablet (8 mg total) by mouth every 8 (eight) hours as needed. Patient taking differently: Take 8 mg by mouth every 8 (eight) hours as needed for vomiting or nausea. 07/26/21   Leftwich-Kirby, Wilmer Floor, CNM  Oxcarbazepine (TRILEPTAL) 300 MG tablet Take 1 tablet (300 mg total) by mouth 2 (two) times daily. 12/27/21   Karsten Ro, MD      Allergies    Patient has no known allergies.    Review of Systems   Review of Systems  Physical Exam Updated Vital Signs BP 120/84 (BP Location: Right Arm)   Pulse 81   Temp 98.2 F (36.8 C) (Oral)   Resp (!) 22   SpO2 100%  Physical Exam Constitutional:      Appearance: She is ill-appearing.     Comments: Diaphoretic, looking around room.  Responds to questions with head nods, but minimal verbal responses  HENT:     Head: Normocephalic and atraumatic.     Right Ear: External ear normal.     Left Ear: External ear normal.     Nose: Nose normal.     Mouth/Throat:     Mouth: Mucous membranes are moist.  Eyes:     Extraocular Movements: Extraocular movements intact.     Conjunctiva/sclera: Conjunctivae normal.  Comments: Dilated pupils bilaterally  Cardiovascular:     Rate and Rhythm: Regular rhythm. Tachycardia present.     Pulses: Normal pulses.  Pulmonary:     Effort: Pulmonary effort is normal. No respiratory distress.     Breath sounds: No wheezing.  Abdominal:     General: Abdomen is flat.     Palpations: Abdomen is soft.     Tenderness: There is no abdominal tenderness. There is no guarding or rebound.  Musculoskeletal:        General: No swelling.     Right lower leg: No edema.     Left lower leg: No edema.  Skin:    Coloration: Skin is pale.     Comments: Diaphoretic  Neurological:     Mental Status: She is  alert. She is disoriented.     Motor: No weakness.     ED Results / Procedures / Treatments   Labs (all labs ordered are listed, but only abnormal results are displayed) Labs Reviewed  COMPREHENSIVE METABOLIC PANEL - Abnormal; Notable for the following components:      Result Value   Potassium 3.2 (*)    CO2 18 (*)    Glucose, Bld 109 (*)    Total Protein 8.2 (*)    Anion gap 16 (*)    All other components within normal limits  CBC WITH DIFFERENTIAL/PLATELET - Abnormal; Notable for the following components:   WBC 10.9 (*)    MCV 78.0 (*)    MCH 24.3 (*)    Neutro Abs 9.2 (*)    All other components within normal limits  ACETAMINOPHEN LEVEL - Abnormal; Notable for the following components:   Acetaminophen (Tylenol), Serum <10 (*)    All other components within normal limits  SALICYLATE LEVEL - Abnormal; Notable for the following components:   Salicylate Lvl <7.0 (*)    All other components within normal limits  ETHANOL  HCG, SERUM, QUALITATIVE  RAPID URINE DRUG SCREEN, HOSP PERFORMED  URINALYSIS, W/ REFLEX TO CULTURE (INFECTION SUSPECTED)  CBG MONITORING, ED    EKG EKG Interpretation Date/Time:  Monday June 02 2023 20:59:10 EDT Ventricular Rate:  92 PR Interval:  142 QRS Duration:  98 QT Interval:  420 QTC Calculation: 519 R Axis:   80  Text Interpretation: Normal sinus rhythm Right atrial enlargement T wave abnormality Abnormal ECG Confirmed by Gerhard Munch [phone]) on 06/02/2023 11:35:31 PM  Radiology DG Chest 1 View  Result Date: 06/02/2023 CLINICAL DATA:  Tachypnea. EXAM: CHEST  1 VIEW COMPARISON:  10/04/2020 FINDINGS: The cardiomediastinal contours are normal. The lungs are clear. Pulmonary vasculature is normal. No consolidation, pleural effusion, or pneumothorax. No acute osseous abnormalities are seen. Overlying jewelry artifacts at the apices. Bilateral nipple shadows. IMPRESSION: No acute chest findings. Electronically Signed   By: Narda Rutherford  M.D.   On: 06/02/2023 21:09    Procedures Procedures    Medications Ordered in ED Medications  lactated ringers bolus 1,000 mL (0 mLs Intravenous Stopped 06/02/23 2157)    ED Course/ Medical Decision Making/ A&P                                 Medical Decision Making Amount and/or Complexity of Data Reviewed Labs: ordered. Radiology: ordered.   27 year old female with past medical history and HPI as above.  On arrival, patient is ill-appearing, pale, and diaphoretic.  Vital signs are noteworthy for tachycardia to 130.  Patient herself is unable to provide any meaningful history, simply nodding or shaking her head in response to questions, but not coinciding with the questions asked.  Concerns for acute intoxication versus withdrawal.  Patient cannot tell me what substance she is taking or has taken in the past.  Will perform medical screening examination as well as laboratory workup in addition to EKG, chest x-ray, and urine studies.  In the interim, will provide IV fluid bolus to reassess tachycardia mental status.  Patient is EKG reviewed which shows normal sinus rhythm, but she does have diffuse T wave inversions in nearly all leads.    Patient is laboratory workup reviewed.  Potassium is mildly low at 3.2 and she does have a slight anion gap at 16, but no other gross metabolic derangements.  No AKI.  White blood cell count is only mildly elevated at 10.9.  Patient was negative for coingestions such as acetaminophen, salicylates, ethanol.  She is pregnancy negative.  On reassessment, patient remains significantly altered, not answering questions appropriately.  However, her tachycardia has improved to the low 90s.  Her boyfriend arrived at bedside who was able to provide additional collateral history.  He states that patient uses benzodiazepines as well as OxyContin at home.  Boyfriend notes that she last used any substances approximately 56 hours ago and is concerned for withdrawal.   However, clinical presentation not consistent with withdrawal, or other concern for intoxication.  At time of signout, patient is still altered.  Urinalysis and urine drug screen have not been collected either.  Oncoming ED team is aware of patient's ED course and aware plan to follow-up on remaining studies and reassess once patient has metabolized.  Ultimate disposition pending.   Final Clinical Impression(s) / ED Diagnoses Final diagnoses:  Tachycardia    Rx / DC Orders ED Discharge Orders     None         Lyman Speller, MD 06/02/23 2345    Gerhard Munch, MD 06/03/23 1921

## 2023-06-02 NOTE — ED Triage Notes (Signed)
Pt BIB [facility] from [facility]. EMS reports that the visit the [facility] was for withdrawals but unknown from what. Pt is c/o n/v, pt reports 2 episodes of throwing up today. Pt reports taking Xanax but unable to tell the last time she took some. Pt is A&Ox3. GCS 14.  EMS VS BP 118/80 P 93 O2 98% R 18 CBG 98

## 2023-06-03 ENCOUNTER — Ambulatory Visit (HOSPITAL_COMMUNITY)
Admission: EM | Admit: 2023-06-03 | Discharge: 2023-06-03 | Disposition: A | Discharge status: Other Acute Inpatient Hospital | Payer: No Typology Code available for payment source | Attending: Nurse Practitioner | Admitting: Nurse Practitioner

## 2023-06-03 ENCOUNTER — Other Ambulatory Visit: Payer: Self-pay

## 2023-06-03 ENCOUNTER — Emergency Department (HOSPITAL_COMMUNITY)
Admission: EM | Admit: 2023-06-03 | Discharge: 2023-06-03 | Disposition: A | Discharge status: Other Acute Inpatient Hospital | Payer: No Typology Code available for payment source | Source: Home / Self Care | Attending: Emergency Medicine | Admitting: Emergency Medicine

## 2023-06-03 ENCOUNTER — Encounter (HOSPITAL_COMMUNITY): Payer: Self-pay | Admitting: Registered Nurse

## 2023-06-03 DIAGNOSIS — F1910 Other psychoactive substance abuse, uncomplicated: Secondary | ICD-10-CM | POA: Diagnosis not present

## 2023-06-03 DIAGNOSIS — R1114 Bilious vomiting: Secondary | ICD-10-CM | POA: Insufficient documentation

## 2023-06-03 DIAGNOSIS — F13231 Sedative, hypnotic or anxiolytic dependence with withdrawal delirium: Secondary | ICD-10-CM | POA: Insufficient documentation

## 2023-06-03 DIAGNOSIS — F13930 Sedative, hypnotic or anxiolytic use, unspecified with withdrawal, uncomplicated: Secondary | ICD-10-CM

## 2023-06-03 DIAGNOSIS — F13939 Sedative, hypnotic or anxiolytic use, unspecified with withdrawal, unspecified: Secondary | ICD-10-CM | POA: Diagnosis present

## 2023-06-03 DIAGNOSIS — F322 Major depressive disorder, single episode, severe without psychotic features: Secondary | ICD-10-CM

## 2023-06-03 DIAGNOSIS — E876 Hypokalemia: Secondary | ICD-10-CM | POA: Insufficient documentation

## 2023-06-03 DIAGNOSIS — R112 Nausea with vomiting, unspecified: Secondary | ICD-10-CM | POA: Diagnosis not present

## 2023-06-03 DIAGNOSIS — F1994 Other psychoactive substance use, unspecified with psychoactive substance-induced mood disorder: Secondary | ICD-10-CM

## 2023-06-03 DIAGNOSIS — F1990 Other psychoactive substance use, unspecified, uncomplicated: Secondary | ICD-10-CM

## 2023-06-03 DIAGNOSIS — F1594 Other stimulant use, unspecified with stimulant-induced mood disorder: Secondary | ICD-10-CM

## 2023-06-03 DIAGNOSIS — F13931 Sedative, hypnotic or anxiolytic use, unspecified with withdrawal delirium: Secondary | ICD-10-CM

## 2023-06-03 DIAGNOSIS — F1413 Cocaine abuse, unspecified with withdrawal: Secondary | ICD-10-CM | POA: Diagnosis not present

## 2023-06-03 LAB — URINALYSIS, W/ REFLEX TO CULTURE (INFECTION SUSPECTED)
Bilirubin Urine: NEGATIVE
Glucose, UA: NEGATIVE mg/dL
Ketones, ur: 80 mg/dL — AB
Nitrite: NEGATIVE
Protein, ur: 30 mg/dL — AB
Specific Gravity, Urine: 1.03 (ref 1.005–1.030)
pH: 5 (ref 5.0–8.0)

## 2023-06-03 LAB — CBC WITH DIFFERENTIAL/PLATELET
Abs Immature Granulocytes: 0.02 10*3/uL (ref 0.00–0.07)
Basophils Absolute: 0 10*3/uL (ref 0.0–0.1)
Basophils Relative: 1 %
Eosinophils Absolute: 0 10*3/uL (ref 0.0–0.5)
Eosinophils Relative: 0 %
HCT: 37.1 % (ref 36.0–46.0)
Hemoglobin: 11.6 g/dL — ABNORMAL LOW (ref 12.0–15.0)
Immature Granulocytes: 0 %
Lymphocytes Relative: 27 %
Lymphs Abs: 1.8 10*3/uL (ref 0.7–4.0)
MCH: 24.5 pg — ABNORMAL LOW (ref 26.0–34.0)
MCHC: 31.3 g/dL (ref 30.0–36.0)
MCV: 78.3 fL — ABNORMAL LOW (ref 80.0–100.0)
Monocytes Absolute: 0.5 10*3/uL (ref 0.1–1.0)
Monocytes Relative: 7 %
Neutro Abs: 4.4 10*3/uL (ref 1.7–7.7)
Neutrophils Relative %: 65 %
Platelets: 248 10*3/uL (ref 150–400)
RBC: 4.74 MIL/uL (ref 3.87–5.11)
RDW: 15.1 % (ref 11.5–15.5)
WBC: 6.7 10*3/uL (ref 4.0–10.5)
nRBC: 0 % (ref 0.0–0.2)

## 2023-06-03 LAB — COMPREHENSIVE METABOLIC PANEL
ALT: 13 U/L (ref 0–44)
AST: 19 U/L (ref 15–41)
Albumin: 4.2 g/dL (ref 3.5–5.0)
Alkaline Phosphatase: 41 U/L (ref 38–126)
Anion gap: 14 (ref 5–15)
BUN: 13 mg/dL (ref 6–20)
CO2: 19 mmol/L — ABNORMAL LOW (ref 22–32)
Calcium: 9.4 mg/dL (ref 8.9–10.3)
Chloride: 106 mmol/L (ref 98–111)
Creatinine, Ser: 0.63 mg/dL (ref 0.44–1.00)
GFR, Estimated: >60 mL/min (ref >60)
Glucose, Bld: 92 mg/dL (ref 70–99)
Potassium: 3.1 mmol/L — ABNORMAL LOW (ref 3.5–5.1)
Sodium: 139 mmol/L (ref 135–145)
Total Bilirubin: 0.7 mg/dL (ref 0.3–1.2)
Total Protein: 7.3 g/dL (ref 6.5–8.1)

## 2023-06-03 LAB — RAPID URINE DRUG SCREEN, HOSP PERFORMED
Amphetamines: POSITIVE — AB
Barbiturates: NOT DETECTED
Benzodiazepines: POSITIVE — AB
Cocaine: POSITIVE — AB
Opiates: NOT DETECTED
Tetrahydrocannabinol: POSITIVE — AB

## 2023-06-03 LAB — MAGNESIUM: Magnesium: 2.5 mg/dL — ABNORMAL HIGH (ref 1.7–2.4)

## 2023-06-03 MED ORDER — DICYCLOMINE HCL 20 MG PO TABS: 20.0000 mg | ORAL_TABLET | Freq: Four times a day (QID) | ORAL | Status: DC | PRN

## 2023-06-03 MED ORDER — THIAMINE HCL 100 MG/ML IJ SOLN
100.0000 mg | Freq: Once | INTRAMUSCULAR | Status: AC
  Administered 2023-06-03: 100 mg via INTRAMUSCULAR
  Filled 2023-06-03: qty 2

## 2023-06-03 MED ORDER — HYDROXYZINE HCL 25 MG PO TABS: 25.0000 mg | ORAL_TABLET | Freq: Four times a day (QID) | ORAL | Status: DC | PRN

## 2023-06-03 MED ORDER — ALUM & MAG HYDROXIDE-SIMETH 200-200-20 MG/5ML PO SUSP: 30.0000 mL | ORAL | Status: DC | PRN

## 2023-06-03 MED ORDER — CHLORDIAZEPOXIDE HCL 25 MG PO CAPS: 25.0000 mg | ORAL_CAPSULE | Freq: Every day | ORAL | Status: DC

## 2023-06-03 MED ORDER — POTASSIUM CHLORIDE CRYS ER 20 MEQ PO TBCR
40.0000 meq | EXTENDED_RELEASE_TABLET | Freq: Once | ORAL | Status: DC
  Filled 2023-06-03: qty 2

## 2023-06-03 MED ORDER — SODIUM CHLORIDE 0.9 % IV BOLUS
1000.0000 mL | Freq: Once | INTRAVENOUS | Status: AC
  Administered 2023-06-03: 1000 mL via INTRAVENOUS

## 2023-06-03 MED ORDER — CLONIDINE HCL 0.1 MG PO TABS: 0.1000 mg | ORAL_TABLET | Freq: Every day | ORAL | Status: DC

## 2023-06-03 MED ORDER — POTASSIUM CHLORIDE IN NACL 20-0.9 MEQ/L-% IV SOLN
Freq: Once | INTRAVENOUS | Status: AC
  Filled 2023-06-03: qty 1000

## 2023-06-03 MED ORDER — NAPROXEN 500 MG PO TABS: 500.0000 mg | ORAL_TABLET | Freq: Two times a day (BID) | ORAL | Status: DC | PRN

## 2023-06-03 MED ORDER — PROMETHAZINE HCL 25 MG/ML IJ SOLN
25.0000 mg | Freq: Once | INTRAMUSCULAR | Status: AC
  Administered 2023-06-03: 25 mg via INTRAMUSCULAR

## 2023-06-03 MED ORDER — NITROFURANTOIN MONOHYD MACRO 100 MG PO CAPS
100.0000 mg | ORAL_CAPSULE | Freq: Two times a day (BID) | ORAL | Status: DC
  Filled 2023-06-03 (×2): qty 1

## 2023-06-03 MED ORDER — CHLORDIAZEPOXIDE HCL 25 MG PO CAPS
25.0000 mg | ORAL_CAPSULE | Freq: Four times a day (QID) | ORAL | Status: DC
  Filled 2023-06-03 (×2): qty 1

## 2023-06-03 MED ORDER — LORAZEPAM 2 MG/ML IJ SOLN
1.0000 mg | Freq: Once | INTRAMUSCULAR | Status: AC
  Administered 2023-06-03: 1 mg via INTRAVENOUS
  Filled 2023-06-03: qty 1

## 2023-06-03 MED ORDER — CLONIDINE HCL 0.1 MG PO TABS: 0.1000 mg | ORAL_TABLET | ORAL | Status: DC

## 2023-06-03 MED ORDER — ADULT MULTIVITAMIN W/MINERALS CH
1.0000 | ORAL_TABLET | Freq: Every day | ORAL | Status: DC
  Administered 2023-06-03: 1 via ORAL
  Filled 2023-06-03: qty 1

## 2023-06-03 MED ORDER — CLONIDINE HCL 0.1 MG PO TABS
0.1000 mg | ORAL_TABLET | Freq: Four times a day (QID) | ORAL | Status: DC
  Administered 2023-06-03: 0.1 mg via ORAL
  Filled 2023-06-03: qty 1

## 2023-06-03 MED ORDER — LORAZEPAM 2 MG/ML IJ SOLN
0.5000 mg | Freq: Once | INTRAMUSCULAR | Status: AC
  Administered 2023-06-03: 0.5 mg via INTRAVENOUS
  Filled 2023-06-03: qty 1

## 2023-06-03 MED ORDER — CHLORDIAZEPOXIDE HCL 25 MG PO CAPS: 25.0000 mg | ORAL_CAPSULE | Freq: Three times a day (TID) | ORAL | Status: DC

## 2023-06-03 MED ORDER — CHLORDIAZEPOXIDE HCL 25 MG PO CAPS: 25.0000 mg | ORAL_CAPSULE | ORAL | Status: DC

## 2023-06-03 MED ORDER — METHOCARBAMOL 500 MG PO TABS: 500.0000 mg | ORAL_TABLET | Freq: Three times a day (TID) | ORAL | Status: DC | PRN

## 2023-06-03 MED ORDER — ADULT MULTIVITAMIN W/MINERALS CH: 1.0000 | ORAL_TABLET | Freq: Every day | ORAL | Status: DC

## 2023-06-03 MED ORDER — ACETAMINOPHEN 500 MG PO TABS: 500.0000 mg | ORAL_TABLET | Freq: Four times a day (QID) | ORAL | Status: DC | PRN

## 2023-06-03 MED ORDER — CHLORDIAZEPOXIDE HCL 25 MG PO CAPS: 25.0000 mg | ORAL_CAPSULE | Freq: Four times a day (QID) | ORAL | Status: DC | PRN

## 2023-06-03 MED ORDER — LOPERAMIDE HCL 2 MG PO CAPS: 2.0000 mg | ORAL_CAPSULE | ORAL | Status: DC | PRN

## 2023-06-03 MED ORDER — MAGNESIUM HYDROXIDE 400 MG/5ML PO SUSP: 30.0000 mL | Freq: Every day | ORAL | Status: DC | PRN

## 2023-06-03 MED ORDER — ADULT MULTIVITAMIN W/MINERALS CH
1.0000 | ORAL_TABLET | Freq: Once | ORAL | Status: DC
Start: 2023-06-03 — End: 2023-06-03

## 2023-06-03 NOTE — ED Provider Notes (Signed)
[facility] Continuous Assessment Admission H&P  Date: 06/03/23 Patient Name: Gabrielle Nguyen MRN: [id number] Chief Complaint: "Detox"  Diagnoses:  Final diagnoses:  Polysubstance abuse (HCC)  Substance use disorder  Moderately severe major depression (HCC)  Other stimulant-induced mood disorder (HCC)    HPI: Gabrielle Nguyen is a 27 year old female with psychiatric history of substance abuse, suicidal ideation (2023), GAD, MDD, PTSD, and opioid use disorder, who presented voluntarily as a walk-in to [facility] accompanied by her boyfriend Darlen Round [phone] requesting detox.  Patient gave permission for her boyfriend to remain present during this evaluation.  Patient was seen face-to-face by this provider and chart reviewed. Per chart review, patient presented to [facility] 06/02/23 with altered mental status and complaining of withdrawals from unknown substance.  Patient was transferred to [facility] for medical clearance.  Patient was evaluated at the ED, stabilized and discharged to follow up with her outpatient psychiatric provider.  Patient returned to [facility] this am to continue her psychiatric care/detox treatment.  On evaluation, patient is alert, wide-eyed at baseline, and cooperative. Speech is clear, delayed and patient is unable to provide any meaningful history, but nods or shakes her head with a wide eye gaze.  Pt appears disheveled.  Eye contact is poor. Mood is anxious, affect is congruent with mood. Thought process is linear and loose, and thought content is WDL. Pt denies SI/HI/AVH. There is no objective indication that the patient is responding to internal stimuli. No delusions elicited during this assessment.   On approach, patient is seated on a facility wheelchair with her legs up.  Patient's responses to my questions are slow and delayed, and her boyfriend provides most of the answers. However she denies SI/HI/AVH or paranoia.  She reports her last drug ingestion was 50 or more hours  ago.  UDS at the ED was positive for amphetamines, Benzos, cocaine, and THC. Patient endorses history of withdrawals and seizures during withdrawals.  Patient's boyfriend reports patient's attempt to quit by herself many times which has been unsuccessful.  The patient lives with her boyfriend.  She denies a history of self-harm or suicide attempt however per chart review, patient has a history of suicide attempt in 2023.  Patient reports she is not currently taking any psych medications and is not established with an outpatient psychiatric provider.  Patient's boyfriend reports the patient was prescribed Klonopin for anxiety a long time ago(at least a year ago), but she stopped taking this medication after she lost her insurance and she started getting Xanax off the streets and eventually other substances.   Patient endorses depression and anxiety.  Patient completed the PHQ-9 questionnaire and obtained a total score of 18, indicating moderately severe depression.  Patient is appropriate for the [facility].  Recommend COWS protocol.  Patient to be admitted to the continuous observation unit pending bed availability at the [facility].  Support, encouragement, reassurance provided about ongoing stressors.  Patient is provided with opportunity for questions.  Total Time spent with patient: 20 minutes  Musculoskeletal  Strength & Muscle Tone: within normal limits Gait & Station: normal Patient leans: N/A  Psychiatric Specialty Exam  Presentation General Appearance:  Disheveled  Eye Contact: Poor  Speech: Slow  Speech Volume: Decreased  Handedness: Right   Mood and Affect  Mood: Anxious  Affect: Congruent   Thought Process  Thought Processes: Linear  Descriptions of Associations:Loose  Orientation:Partial  Thought Content:WDL  Diagnosis of Schizophrenia or Schizoaffective disorder in past: No data recorded Duration of Psychotic Symptoms:  No data  recorded Hallucinations:Hallucinations: None  Ideas of Reference:None  Suicidal Thoughts:Suicidal Thoughts: No  Homicidal Thoughts:Homicidal Thoughts: No   Sensorium  Memory: Immediate Poor  Judgment: Poor  Insight: Poor   Executive Functions  Concentration: Poor  Attention Span: Poor  Recall: Poor  Fund of Knowledge: Poor  Language: Poor   Psychomotor Activity  Psychomotor Activity: Psychomotor Activity: Mannerisms   Assets  Assets: Communication Skills; Desire for Improvement   Sleep  Sleep: Sleep: Poor   Nutritional Assessment (For OBS and FBC admissions only) Has the patient had a weight loss or gain of 10 pounds or more in the last 3 months?: No Has the patient had a decrease in food intake/or appetite?: No Does the patient have dental problems?: No Does the patient have eating habits or behaviors that may be indicators of an eating disorder including binging or inducing vomiting?: No Has the patient recently lost weight without trying?: 0 Has the patient been eating poorly because of a decreased appetite?: 0 Malnutrition Screening Tool Score: 0    Physical Exam Constitutional:      Appearance: She is ill-appearing.  HENT:     Head: Normocephalic.     Nose: No congestion.  Eyes:     General:        Right eye: No discharge.        Left eye: No discharge.  Pulmonary:     Effort: No respiratory distress.  Chest:     Chest wall: No tenderness.  Skin:    Coloration: Skin is pale.  Neurological:     Mental Status: She is alert. Mental status is at baseline.  Psychiatric:        Attention and Perception: Attention and perception normal.        Mood and Affect: Mood is anxious. Affect is inappropriate.        Speech: Speech is delayed.        Behavior: Behavior is cooperative.        Thought Content: Thought content is not paranoid or delusional. Thought content does not include homicidal or suicidal ideation. Thought content does  not include homicidal or suicidal plan.        Judgment: Judgment is inappropriate.    Review of Systems  Constitutional:  Negative for chills, diaphoresis and fever.  HENT:  Negative for congestion.   Eyes:  Negative for discharge.  Respiratory:  Negative for cough, shortness of breath and wheezing.   Cardiovascular:  Negative for chest pain and palpitations.  Gastrointestinal:  Negative for diarrhea, nausea and vomiting.  Neurological:  Negative for dizziness, seizures, weakness and headaches.  Psychiatric/Behavioral:  Positive for depression and substance abuse. The patient is nervous/anxious and has insomnia.      Past Psychiatric History: See H & P   Is the patient at risk to self? Yes  Has the patient been a risk to self in the past 6 months? Yes .    Has the patient been a risk to self within the distant past? Yes   Is the patient a risk to others? No   Has the patient been a risk to others in the past 6 months? No   Has the patient been a risk to others within the distant past? No   Past Medical History: See Chart  Family History: N/A  Social History: N/A  Last Labs:  Admission on 06/02/2023, Discharged on 06/03/2023  Component Date Value Ref Range Status   Sodium 06/02/2023 137  135 -  145 mmol/L Final   Potassium 06/02/2023 3.2 (L)  3.5 - 5.1 mmol/L Final   Chloride 06/02/2023 103  98 - 111 mmol/L Final   CO2 06/02/2023 18 (L)  22 - 32 mmol/L Final   Glucose, Bld 06/02/2023 109 (H)  70 - 99 mg/dL Final   Glucose reference range applies only to samples taken after fasting for at least 8 hours.   BUN 06/02/2023 15  6 - 20 mg/dL Final   Creatinine, Ser 06/02/2023 0.68  0.44 - 1.00 mg/dL Final   Calcium 30/86/5784 9.7  8.9 - 10.3 mg/dL Final   Total Protein 69/62/9528 8.2 (H)  6.5 - 8.1 g/dL Final   Albumin 41/32/4401 4.6  3.5 - 5.0 g/dL Final   AST 02/72/5366 16  15 - 41 U/L Final   ALT 06/02/2023 14  0 - 44 U/L Final   Alkaline Phosphatase 06/02/2023 41  38 - 126  U/L Final   Total Bilirubin 06/02/2023 0.6  0.3 - 1.2 mg/dL Final   GFR, Estimated 06/02/2023 >60  >60 mL/min Final   Comment: (NOTE) Calculated using the CKD-EPI Creatinine Equation (2021)    Anion gap 06/02/2023 16 (H)  5 - 15 Final   Performed at [facility], [address] [place], [place] [address]   Alcohol, Ethyl (B) 06/02/2023 <10  <10 mg/dL Final   Comment: (NOTE) Lowest detectable limit for serum alcohol is 10 mg/dL.  For medical purposes only. Performed at [facility], [address], [place], [place] [address]    Opiates 06/03/2023 NONE DETECTED  NONE DETECTED Final   Cocaine 06/03/2023 POSITIVE (A)  NONE DETECTED Final   Benzodiazepines 06/03/2023 POSITIVE (A)  NONE DETECTED Final   Amphetamines 06/03/2023 POSITIVE (A)  NONE DETECTED Final   Tetrahydrocannabinol 06/03/2023 POSITIVE (A)  NONE DETECTED Final   Barbiturates 06/03/2023 NONE DETECTED  NONE DETECTED Final   Comment: (NOTE) DRUG SCREEN FOR MEDICAL PURPOSES ONLY.  IF CONFIRMATION IS NEEDED FOR ANY PURPOSE, NOTIFY LAB WITHIN 5 DAYS.  LOWEST DETECTABLE LIMITS FOR URINE DRUG SCREEN Drug Class                     Cutoff (ng/mL) Amphetamine and metabolites    1000 Barbiturate and metabolites    200 Benzodiazepine                 200 Opiates and metabolites        300 Cocaine and metabolites        300 THC                            50 Performed at [facility], [address] [place], [place] [address]    WBC 06/02/2023 10.9 (H)  4.0 - 10.5 K/uL Final   RBC 06/02/2023 5.10  3.87 - 5.11 MIL/uL Final   Hemoglobin 06/02/2023 12.4  12.0 - 15.0 g/dL Final   HCT 56/43/3295 39.8  36.0 - 46.0 % Final   MCV 06/02/2023 78.0 (L)  80.0 - 100.0 fL Final   MCH 06/02/2023 24.3 (L)  26.0 - 34.0 pg Final   MCHC 06/02/2023 31.2  30.0 - 36.0 g/dL Final   RDW 18/84/1660 14.8  11.5 - 15.5 % Final   Platelets 06/02/2023 307  150 - 400 K/uL Final   nRBC 06/02/2023 0.0  0.0 - 0.2 % Final   Neutrophils  Relative % 06/02/2023 85  %  Final   Neutro Abs 06/02/2023 9.2 (H)  1.7 - 7.7 K/uL Final   Lymphocytes Relative 06/02/2023 10  % Final   Lymphs Abs 06/02/2023 1.1  0.7 - 4.0 K/uL Final   Monocytes Relative 06/02/2023 5  % Final   Monocytes Absolute 06/02/2023 0.5  0.1 - 1.0 K/uL Final   Eosinophils Relative 06/02/2023 0  % Final   Eosinophils Absolute 06/02/2023 0.0  0.0 - 0.5 K/uL Final   Basophils Relative 06/02/2023 0  % Final   Basophils Absolute 06/02/2023 0.0  0.0 - 0.1 K/uL Final   Immature Granulocytes 06/02/2023 0  % Final   Abs Immature Granulocytes 06/02/2023 0.03  0.00 - 0.07 K/uL Final   Performed at [facility], [address], [place], [place] [address]   Preg, Serum 06/02/2023 NEGATIVE  NEGATIVE Final   Comment:        THE SENSITIVITY OF THIS METHODOLOGY IS >10 mIU/mL. Performed at [facility], [address] [place], [place] [address]    Glucose-Capillary 06/02/2023 93  70 - 99 mg/dL Final   Glucose reference range applies only to samples taken after fasting for at least 8 hours.   Acetaminophen (Tylenol), Serum 06/02/2023 <10 (L)  10 - 30 ug/mL Final   Comment: (NOTE) Therapeutic concentrations vary significantly. A range of 10-30 ug/mL  may be an effective concentration for many patients. However, some  are best treated at concentrations outside of this range. Acetaminophen concentrations >150 ug/mL at 4 hours after ingestion  and >50 ug/mL at 12 hours after ingestion are often associated with  toxic reactions.  Performed at [facility], [address] [place], [place] [address]    Salicylate Lvl 06/02/2023 <7.0 (L)  7.0 - 30.0 mg/dL Final   Performed at [facility], [address], [place], [place] [address]   Specimen Source 06/03/2023 URINE, CLEAN CATCH   Final   Color, Urine 06/03/2023 YELLOW  YELLOW Final   APPearance 06/03/2023 HAZY (A)  CLEAR Final   Specific Gravity, Urine 06/03/2023 1.030  1.005 - 1.030 Final   pH  06/03/2023 5.0  5.0 - 8.0 Final   Glucose, UA 06/03/2023 NEGATIVE  NEGATIVE mg/dL Final   Hgb urine dipstick 06/03/2023 LARGE (A)  NEGATIVE Final   Bilirubin Urine 06/03/2023 NEGATIVE  NEGATIVE Final   Ketones, ur 06/03/2023 80 (A)  NEGATIVE mg/dL Final   Protein, ur 29/56/2130 30 (A)  NEGATIVE mg/dL Final   Nitrite 86/57/8469 NEGATIVE  NEGATIVE Final   Leukocytes,Ua 06/03/2023 SMALL (A)  NEGATIVE Final   RBC / HPF 06/03/2023 11-20  0 - 5 RBC/hpf Final   WBC, UA 06/03/2023 21-50  0 - 5 WBC/hpf Final   Comment:        Reflex urine culture not performed if WBC <=10, OR if Squamous epithelial cells >5. If Squamous epithelial cells >5 suggest recollection.    Bacteria, UA 06/03/2023 MANY (A)  NONE SEEN Final   Squamous Epithelial / HPF 06/03/2023 6-10  0 - 5 /HPF Final   Mucus 06/03/2023 PRESENT   Final   Performed at [facility], [address] [place], [place] [address]    Allergies: Patient has no known allergies.  Medications:  Facility Ordered Medications  Medication   acetaminophen (TYLENOL) tablet 500 mg   alum & mag hydroxide-simeth (MAALOX/MYLANTA) 200-200-20 MG/5ML suspension 30 mL   magnesium hydroxide (MILK OF MAGNESIA) suspension 30 mL   dicyclomine (BENTYL) tablet 20 mg  hydrOXYzine (ATARAX) tablet 25 mg   loperamide (IMODIUM) capsule 2-4 mg   methocarbamol (ROBAXIN) tablet 500 mg   naproxen (NAPROSYN) tablet 500 mg   cloNIDine (CATAPRES) tablet 0.1 mg   Followed by   Melene Muller ON 06/05/2023] cloNIDine (CATAPRES) tablet 0.1 mg   Followed by   Melene Muller ON 06/07/2023] cloNIDine (CATAPRES) tablet 0.1 mg   potassium chloride SA (KLOR-CON M) CR tablet 40 mEq   multivitamin with minerals tablet 1 tablet   PTA Medications  Medication Sig   albuterol (VENTOLIN HFA) 108 (90 Base) MCG/ACT inhaler Inhale 2 puffs into the lungs every 6 (six) hours as needed for wheezing or shortness of breath.      Medical Decision Making  Recommend admit to the continuous  observation unit pending bed availability at the [facility] for substance abuse treatment/detox.   I reviewed labs completed at [facility]  Per EDP: EKG reviewed which shows normal sinus rhythm, but she does have diffuse T wave inversions in nearly all leads, Potassium is mildly low at 3.2 and she does have a slight anion gap at 16, but no other gross metabolic derangements. Will replete with Klor-Con 40 mEQ PO x1 for hypokalemia, No AKI. White blood cell count is only mildly elevated at 10.9. Patient was negative for coingestions such as acetaminophen, salicylates, ethanol. She is pregnancy negative .  Recommend COWS protocol.  Other Meds -KLOR-Con 40 mEq, PO, once for 1 dose hypokalemia -Tylenol 500 mg PO q6h, prn pain, headache -Maalox 30 ml PO q4h, prn indigestion -MOM 30 ml PO PO daily, prn, constipation -MVI with minerals for nutritional support   Recommendations  Based on my evaluation the patient does not appear to have an emergency medical condition.  Recommend admit to the continuous observation unit pending bed availability at the [facility] for substance abuse treatment/detox.    Mancel Bale, NP 06/03/23  6:32 AM

## 2023-06-03 NOTE — ED Triage Notes (Addendum)
Patient from [facility] where she presented voluntarily for detox of multiple drugs for two years. [facility] staff states bizarre behavior this morning, using the bathroom in the room, not wanting to take medications, etc. EMS reports oriented x 2 and slow to respond. Emesis x 2 and was cool and clammy. Phenergan given by staff with resolution of nausea. Complains of generalized body pain.

## 2023-06-03 NOTE — ED Notes (Signed)
Resident exhibiting bizarre behaviors. Patient was observed squatting when a flow of urine could be heard and a smell of BM. On observation there was frank blood mixed with bodily fluids Patient observed to be on menstrual cycle. The provider was alerted to the patients behavior and came to assess. The provider was informed patient did not take KCL this morning, consulted with the patient on importance of medication adherence. and instructed writer to give the KCL and meds at this time.

## 2023-06-03 NOTE — Progress Notes (Signed)
   06/03/23 0603  [facility] Triage Screening (Walk-ins at [facility] only)  How Did You Hear About [place]? Family/Friend  What Is the Reason for Your Visit/Call Today? Pt presents to [facility] voluntarily, accompanied by her boyfriend requesting substance abuse treatment/detox. Pt reports withdrawal from Xanax and last used about 2 days ago. Per boyfriend, pt did not look well yesterday and he brought her in to be seen.  Pt reports hx of seizures when withdrawing, her last seizure was last week. Pt was sent to ED upon arrival to [facility] yesterday due to concerns of withdrawals or intoxication. Pt discharged from ED this morning and following up to be evaluated. Pt currently denies SI,HI,AVH.  How Long Has This Been Causing You Problems? > than 6 months  Have You Recently Had Any Thoughts About Hurting Yourself? No  Are You Planning to Commit Suicide/Harm Yourself At This time? No  Have you Recently Had Thoughts About Hurting Someone [name]? No  Are You Planning To Harm Someone At This Time? No  Are you currently experiencing any auditory, visual or other hallucinations? No  Have You Used Any Alcohol or Drugs in the Past 24 Hours? No  Do you have any current medical co-morbidities that require immediate attention? No  Clinician description of patient physical appearance/behavior: sitting in wheelchair, able to answer some questions posed although her boyfriend responds mostly  What Do You Feel Would Help You the Most Today? Alcohol or Drug Use Treatment  If access to [facility]gent [facility] was not available, would you have sought care in the Emergency Department? No  Determination of Need Urgent (48 hours)  Options For Referral Other: Comment;Outpatient Therapy;Chemical Dependency Intensive Outpatient Therapy (CDIOP);Facility-Based Crisis

## 2023-06-03 NOTE — ED Provider Notes (Signed)
[place] EMERGENCY DEPARTMENT AT [facility] Provider Note   CSN: [id number] Arrival date & time: 06/03/23  1738     History  No chief complaint on file.   Gabrielle Nguyen is a 27 y.o. female.  HPI Patient presents from our affiliated behavioral health urgent care center with concern for anorexia, nausea, vomiting.  I saw the patient yesterday as she was transferred to this facility from that facility, at that point she was pale, presenting with concern for possible overdose.  In the interim, patient's care was completed here, she was transferred back to her behavioral center and now presents with staff concern they are of persistent nausea, vomiting. Patient notes that she has been on benzodiazepines since she was 27 years old.  Unclear when she took her last dose.  She acknowledges generalized discomfort, nausea, no specific pain.  Per behavioral health notes patient had persistent nausea, vomiting, was sent here for additional evaluation.     Home Medications Prior to Admission medications   Medication Sig Start Date End Date Taking? Authorizing Provider  albuterol (VENTOLIN HFA) 108 (90 Base) MCG/ACT inhaler Inhale 2 puffs into the lungs every 6 (six) hours as needed for wheezing or shortness of breath. 01/27/22   Hermelinda Dellen, MD      Allergies    Patient has no known allergies.    Review of Systems   Review of Systems  All other systems reviewed and are negative.   Physical Exam Updated Vital Signs BP 124/82 (BP Location: Right Arm)   Pulse 72   Temp 98.2 F (36.8 C) (Oral)   Resp 16   SpO2 100%  Physical Exam Constitutional:      Appearance: She is ill-appearing.  HENT:     Head: Normocephalic.     Nose: No congestion.  Eyes:     General:        Right eye: No discharge.        Left eye: No discharge.  Pulmonary:     Effort: No respiratory distress.  Chest:     Chest wall: No tenderness.  Abdominal:     Tenderness: There is no abdominal  tenderness. There is no guarding.  Skin:    Coloration: Skin is pale.  Neurological:     Mental Status: She is alert. Mental status is at baseline.  Psychiatric:        Attention and Perception: Attention and perception normal.        Mood and Affect: Mood is anxious. Affect is inappropriate.        Speech: Speech is delayed.        Behavior: Behavior is cooperative.        Thought Content: Thought content is not paranoid or delusional. Thought content does not include homicidal or suicidal ideation. Thought content does not include homicidal or suicidal plan.        Judgment: Judgment is inappropriate.     ED Results / Procedures / Treatments   Labs (all labs ordered are listed, but only abnormal results are displayed) Labs Reviewed  CBC WITH DIFFERENTIAL/PLATELET - Abnormal; Notable for the following components:      Result Value   Hemoglobin 11.6 (*)    MCV 78.3 (*)    MCH 24.5 (*)    All other components within normal limits  COMPREHENSIVE METABOLIC PANEL  MAGNESIUM    EKG None  Radiology DG Chest 1 View  Result Date: 06/02/2023 CLINICAL DATA:  Tachypnea. EXAM: CHEST  1 VIEW COMPARISON:  10/04/2020 FINDINGS: The cardiomediastinal contours are normal. The lungs are clear. Pulmonary vasculature is normal. No consolidation, pleural effusion, or pneumothorax. No acute osseous abnormalities are seen. Overlying jewelry artifacts at the apices. Bilateral nipple shadows. IMPRESSION: No acute chest findings. Electronically Signed   By: Narda Rutherford M.D.   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  Patient offers her own history of using benzodiazepine since she was 4, acknowledges nausea, vomiting, is in no distress, but with some consideration of ongoing nausea vomiting dehydration, patient received fluids, Ativan to ease her symptoms. Labs sent. Cardiac 70 sinus normal Pulse ox 99% room air normal   Amount and/or Complexity of Data Reviewed Independent Historian: friend External Data Reviewed: notes.    Details: As above Labs: ordered. Decision-making details documented in ED Course.  Risk Prescription drug management. Decision regarding hospitalization. Diagnosis or treatment significantly limited by social determinants of health.  On repeat eval the patient is substantially improved having received Ativan.  Patient is now eating a chicken tender.  9:04 PM Patient remains awake, alert.  I discussed her case with our nurse practitioner team Mayford Knife and [place]) from behavioral urgent care, and accepted patient in transfer. Labs in the ED notable for mild hypokalemia and she has had repletion with normal saline with potassium. Patient improved clinically with benzodiazepine.  Though the patient's presentation to behavioral health urgent care was for anxiety, depression and assistance with benzodiazepine withdrawal, this latter concern was making treatment for the former concerns two difficult, and contributing to nausea, vomiting.  Patient has received benzodiazepines for stabilization in the ED, with improvement in her nausea, vomiting, mentation.  Patient will require supervised reduction in benzodiazepine while additional efforts for addressing her anxiety and depression are pursued. Final Clinical Impression(s) / ED Diagnoses Final diagnoses:  Hypokalemia  Bilious vomiting with nausea  Benzodiazepine withdrawal with delirium [facility])    Rx / DC Orders ED Discharge Orders     None          Gerhard Munch, MD 06/03/23 2107

## 2023-06-03 NOTE — ED Notes (Addendum)
Patient observed with bazaar behaviors throughout the shift. She has refused all P.O. medications.She will place in mouth then expel. Patient has emesis x 2 earlier today.  She is non -verbal and only answers yes to all questions with head nod. Patient appears to have slightly clammy skin and tremors. Skin pale in color. Provider alerted and came to assess patient, awaiting orders.
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
## 2023-06-03 NOTE — ED Notes (Signed)
Patient d/c to [facility] ER via EMS stretcher with 3 responders. All belongings accompanied.

## 2023-06-03 NOTE — ED Provider Notes (Signed)
Vitals:   06/03/23 0138 06/03/23 0430  BP: 121/79 121/82  Pulse: 82 79  Resp: 16 16  Temp: 98.2 F (36.8 C) 98.2 F (36.8 C)  SpO2: 100% 100%   Patient reassessed.  She states that she is feeling improved.  Will plan for discharge.  She states that she can go back to detox after being discharged.  Will send patient out with her labs.   Roxy Horseman, PA-C 06/03/23 [id number]    Shon Baton, MD 06/03/23 [phone]

## 2023-06-03 NOTE — ED Notes (Signed)
Provider notified nurse that pt was vomiting. Phenergan 25mg  IM STAT ordered and administered. Will continue to monitor for safety and report any COC.

## 2023-06-03 NOTE — ED Notes (Signed)
Report called into Neurosurgeon at [facility]. EMS called for transport. Awaiting arrival. Will continue to monitor for safety and report any COC.

## 2023-06-03 NOTE — ED Provider Notes (Signed)
Gabrielle Nguyen condition appears to be worsening, nausea, vomiting, not eating or drinking.  She appears pale, drooling from mouth, and thought blocking.  Plan to send to emergency room for medical clearance.  Spoke to Dr. Estanislado Pandy an accepted patient to come  [facility]

## 2023-06-04 ENCOUNTER — Ambulatory Visit (INDEPENDENT_AMBULATORY_CARE_PROVIDER_SITE_OTHER)
Admission: EM | Admit: 2023-06-04 | Discharge: 2023-06-04 | Disposition: A | Discharge status: Other Acute Inpatient Hospital | Payer: No Typology Code available for payment source | Source: Home / Self Care

## 2023-06-04 ENCOUNTER — Encounter (HOSPITAL_COMMUNITY): Payer: Self-pay

## 2023-06-04 ENCOUNTER — Other Ambulatory Visit: Payer: Self-pay

## 2023-06-04 ENCOUNTER — Inpatient Hospital Stay (HOSPITAL_COMMUNITY)
Admission: EM | Admit: 2023-06-04 | Discharge: 2023-06-08 | DRG: 897 | Disposition: A | Discharge status: Home / Self Care | Payer: No Typology Code available for payment source | Attending: Internal Medicine | Admitting: Internal Medicine

## 2023-06-04 DIAGNOSIS — E876 Hypokalemia: Secondary | ICD-10-CM | POA: Insufficient documentation

## 2023-06-04 DIAGNOSIS — F1910 Other psychoactive substance abuse, uncomplicated: Secondary | ICD-10-CM | POA: Diagnosis not present

## 2023-06-04 DIAGNOSIS — F13939 Sedative, hypnotic or anxiolytic use, unspecified with withdrawal, unspecified: Secondary | ICD-10-CM

## 2023-06-04 DIAGNOSIS — F1513 Other stimulant abuse with withdrawal: Secondary | ICD-10-CM | POA: Diagnosis present

## 2023-06-04 DIAGNOSIS — Z8249 Family history of ischemic heart disease and other diseases of the circulatory system: Secondary | ICD-10-CM

## 2023-06-04 DIAGNOSIS — F322 Major depressive disorder, single episode, severe without psychotic features: Secondary | ICD-10-CM

## 2023-06-04 DIAGNOSIS — I4510 Unspecified right bundle-branch block: Secondary | ICD-10-CM | POA: Diagnosis present

## 2023-06-04 DIAGNOSIS — F913 Oppositional defiant disorder: Secondary | ICD-10-CM | POA: Diagnosis present

## 2023-06-04 DIAGNOSIS — F1990 Other psychoactive substance use, unspecified, uncomplicated: Secondary | ICD-10-CM

## 2023-06-04 DIAGNOSIS — R32 Unspecified urinary incontinence: Secondary | ICD-10-CM | POA: Diagnosis present

## 2023-06-04 DIAGNOSIS — Z5986 Financial insecurity: Secondary | ICD-10-CM

## 2023-06-04 DIAGNOSIS — N390 Urinary tract infection, site not specified: Secondary | ICD-10-CM | POA: Insufficient documentation

## 2023-06-04 DIAGNOSIS — Z8619 Personal history of other infectious and parasitic diseases: Secondary | ICD-10-CM

## 2023-06-04 DIAGNOSIS — R1110 Vomiting, unspecified: Secondary | ICD-10-CM | POA: Insufficient documentation

## 2023-06-04 DIAGNOSIS — Z5941 Food insecurity: Secondary | ICD-10-CM

## 2023-06-04 DIAGNOSIS — N3000 Acute cystitis without hematuria: Secondary | ICD-10-CM

## 2023-06-04 DIAGNOSIS — F1324 Sedative, hypnotic or anxiolytic dependence with sedative, hypnotic or anxiolytic-induced mood disorder: Secondary | ICD-10-CM | POA: Insufficient documentation

## 2023-06-04 DIAGNOSIS — F419 Anxiety disorder, unspecified: Secondary | ICD-10-CM | POA: Diagnosis present

## 2023-06-04 DIAGNOSIS — F1994 Other psychoactive substance use, unspecified with psychoactive substance-induced mood disorder: Secondary | ICD-10-CM | POA: Diagnosis not present

## 2023-06-04 DIAGNOSIS — F331 Major depressive disorder, recurrent, moderate: Secondary | ICD-10-CM | POA: Insufficient documentation

## 2023-06-04 DIAGNOSIS — F1210 Cannabis abuse, uncomplicated: Secondary | ICD-10-CM | POA: Insufficient documentation

## 2023-06-04 DIAGNOSIS — F1413 Cocaine abuse, unspecified with withdrawal: Principal | ICD-10-CM | POA: Diagnosis present

## 2023-06-04 DIAGNOSIS — F13230 Sedative, hypnotic or anxiolytic dependence with withdrawal, uncomplicated: Secondary | ICD-10-CM | POA: Insufficient documentation

## 2023-06-04 DIAGNOSIS — F19939 Other psychoactive substance use, unspecified with withdrawal, unspecified: Secondary | ICD-10-CM | POA: Diagnosis not present

## 2023-06-04 DIAGNOSIS — R9431 Abnormal electrocardiogram [ECG] [EKG]: Secondary | ICD-10-CM | POA: Insufficient documentation

## 2023-06-04 DIAGNOSIS — F1213 Cannabis abuse with withdrawal: Secondary | ICD-10-CM | POA: Diagnosis present

## 2023-06-04 DIAGNOSIS — Z818 Family history of other mental and behavioral disorders: Secondary | ICD-10-CM

## 2023-06-04 DIAGNOSIS — F1410 Cocaine abuse, uncomplicated: Secondary | ICD-10-CM | POA: Insufficient documentation

## 2023-06-04 DIAGNOSIS — Z79899 Other long term (current) drug therapy: Secondary | ICD-10-CM

## 2023-06-04 DIAGNOSIS — F4310 Post-traumatic stress disorder, unspecified: Secondary | ICD-10-CM | POA: Diagnosis present

## 2023-06-04 DIAGNOSIS — Z813 Family history of other psychoactive substance abuse and dependence: Secondary | ICD-10-CM

## 2023-06-04 DIAGNOSIS — D649 Anemia, unspecified: Secondary | ICD-10-CM | POA: Diagnosis present

## 2023-06-04 DIAGNOSIS — F13231 Sedative, hypnotic or anxiolytic dependence with withdrawal delirium: Secondary | ICD-10-CM | POA: Diagnosis present

## 2023-06-04 DIAGNOSIS — J45909 Unspecified asthma, uncomplicated: Secondary | ICD-10-CM | POA: Diagnosis present

## 2023-06-04 DIAGNOSIS — Z5982 Transportation insecurity: Secondary | ICD-10-CM

## 2023-06-04 LAB — CBC WITH DIFFERENTIAL/PLATELET
Abs Immature Granulocytes: 0.01 10*3/uL (ref 0.00–0.07)
Abs Immature Granulocytes: 0.02 10*3/uL (ref 0.00–0.07)
Basophils Absolute: 0 10*3/uL (ref 0.0–0.1)
Basophils Absolute: 0 10*3/uL (ref 0.0–0.1)
Basophils Relative: 1 %
Basophils Relative: 1 %
Eosinophils Absolute: 0.1 10*3/uL (ref 0.0–0.5)
Eosinophils Absolute: 0.1 10*3/uL (ref 0.0–0.5)
Eosinophils Relative: 1 %
Eosinophils Relative: 1 %
HCT: 33.8 % — ABNORMAL LOW (ref 36.0–46.0)
HCT: 35.9 % — ABNORMAL LOW (ref 36.0–46.0)
Hemoglobin: 10.5 g/dL — ABNORMAL LOW (ref 12.0–15.0)
Hemoglobin: 11.1 g/dL — ABNORMAL LOW (ref 12.0–15.0)
Immature Granulocytes: 0 %
Immature Granulocytes: 0 %
Lymphocytes Relative: 32 %
Lymphocytes Relative: 43 %
Lymphs Abs: 1.6 10*3/uL (ref 0.7–4.0)
Lymphs Abs: 2.1 10*3/uL (ref 0.7–4.0)
MCH: 25 pg — ABNORMAL LOW (ref 26.0–34.0)
MCH: 25 pg — ABNORMAL LOW (ref 26.0–34.0)
MCHC: 31.1 g/dL (ref 30.0–36.0)
MCHC: 30.9 g/dL (ref 30.0–36.0)
MCV: 80.5 fL (ref 80.0–100.0)
MCV: 80.9 fL (ref 80.0–100.0)
Monocytes Absolute: 0.3 10*3/uL (ref 0.1–1.0)
Monocytes Absolute: 0.3 10*3/uL (ref 0.1–1.0)
Monocytes Relative: 6 %
Monocytes Relative: 6 %
Neutro Abs: 3 10*3/uL (ref 1.7–7.7)
Neutro Abs: 2.4 10*3/uL (ref 1.7–7.7)
Neutrophils Relative %: 60 %
Neutrophils Relative %: 49 %
Platelets: 228 10*3/uL (ref 150–400)
Platelets: 238 10*3/uL (ref 150–400)
RBC: 4.2 MIL/uL (ref 3.87–5.11)
RBC: 4.44 MIL/uL (ref 3.87–5.11)
RDW: 14.9 % (ref 11.5–15.5)
RDW: 14.9 % (ref 11.5–15.5)
WBC: 5 10*3/uL (ref 4.0–10.5)
WBC: 4.9 10*3/uL (ref 4.0–10.5)
nRBC: 0 % (ref 0.0–0.2)
nRBC: 0 % (ref 0.0–0.2)

## 2023-06-04 LAB — COMPREHENSIVE METABOLIC PANEL
ALT: 11 U/L (ref 0–44)
AST: 16 U/L (ref 15–41)
Albumin: 4.3 g/dL (ref 3.5–5.0)
Alkaline Phosphatase: 34 U/L — ABNORMAL LOW (ref 38–126)
Anion gap: 10 (ref 5–15)
BUN: 12 mg/dL (ref 6–20)
CO2: 21 mmol/L — ABNORMAL LOW (ref 22–32)
Calcium: 9.1 mg/dL (ref 8.9–10.3)
Chloride: 105 mmol/L (ref 98–111)
Creatinine, Ser: 0.46 mg/dL (ref 0.44–1.00)
GFR, Estimated: >60 mL/min (ref >60)
Glucose, Bld: 82 mg/dL (ref 70–99)
Potassium: 2.9 mmol/L — ABNORMAL LOW (ref 3.5–5.1)
Sodium: 136 mmol/L (ref 135–145)
Total Bilirubin: 0.7 mg/dL (ref 0.3–1.2)
Total Protein: 7.2 g/dL (ref 6.5–8.1)

## 2023-06-04 LAB — URINALYSIS, ROUTINE W REFLEX MICROSCOPIC
Bilirubin Urine: NEGATIVE
Glucose, UA: NEGATIVE mg/dL
Ketones, ur: 20 mg/dL — AB
Nitrite: NEGATIVE
Protein, ur: NEGATIVE mg/dL
Specific Gravity, Urine: 1.017 (ref 1.005–1.030)
pH: 6 (ref 5.0–8.0)

## 2023-06-04 LAB — PREGNANCY, URINE: Preg Test, Ur: NEGATIVE

## 2023-06-04 LAB — BASIC METABOLIC PANEL
Anion gap: 14 (ref 5–15)
BUN: 13 mg/dL (ref 6–20)
CO2: 20 mmol/L — ABNORMAL LOW (ref 22–32)
Calcium: 9.2 mg/dL (ref 8.9–10.3)
Chloride: 104 mmol/L (ref 98–111)
Creatinine, Ser: 0.64 mg/dL (ref 0.44–1.00)
GFR, Estimated: >60 mL/min (ref >60)
Glucose, Bld: 103 mg/dL — ABNORMAL HIGH (ref 70–99)
Potassium: 3.2 mmol/L — ABNORMAL LOW (ref 3.5–5.1)
Sodium: 138 mmol/L (ref 135–145)

## 2023-06-04 LAB — IRON: Iron: 30 ug/dL (ref 28–170)

## 2023-06-04 LAB — MAGNESIUM: Magnesium: 2.4 mg/dL (ref 1.7–2.4)

## 2023-06-04 MED ORDER — DICYCLOMINE HCL 20 MG PO TABS: 20.0000 mg | ORAL_TABLET | Freq: Four times a day (QID) | ORAL | Status: DC | PRN

## 2023-06-04 MED ORDER — METHOCARBAMOL 500 MG PO TABS: 500.0000 mg | ORAL_TABLET | Freq: Three times a day (TID) | ORAL | Status: DC | PRN

## 2023-06-04 MED ORDER — LORAZEPAM 2 MG/ML IJ SOLN
0.5000 mg | Freq: Once | INTRAMUSCULAR | Status: AC
  Administered 2023-06-04: 0.5 mg via INTRAVENOUS
  Filled 2023-06-04: qty 1

## 2023-06-04 MED ORDER — CLONIDINE HCL 0.1 MG PO TABS: 0.1000 mg | ORAL_TABLET | Freq: Every day | ORAL | Status: DC

## 2023-06-04 MED ORDER — ADULT MULTIVITAMIN W/MINERALS CH
1.0000 | ORAL_TABLET | Freq: Every day | ORAL | Status: DC
  Filled 2023-06-04: qty 1

## 2023-06-04 MED ORDER — ENOXAPARIN SODIUM 40 MG/0.4ML IJ SOSY
40.0000 mg | PREFILLED_SYRINGE | INTRAMUSCULAR | Status: DC
  Administered 2023-06-05 – 2023-06-06 (×2): 40 mg via SUBCUTANEOUS
  Filled 2023-06-04 (×5): qty 0.4

## 2023-06-04 MED ORDER — MELATONIN 3 MG PO TABS: 3.0000 mg | ORAL_TABLET | Freq: Every evening | ORAL | Status: DC | PRN

## 2023-06-04 MED ORDER — CHLORDIAZEPOXIDE HCL 25 MG PO CAPS: 25.0000 mg | ORAL_CAPSULE | Freq: Four times a day (QID) | ORAL | Status: DC | PRN

## 2023-06-04 MED ORDER — ENSURE ENLIVE PO LIQD
237.0000 mL | Freq: Three times a day (TID) | ORAL | Status: DC
  Administered 2023-06-04: 237 mL via ORAL
  Filled 2023-06-04 (×4): qty 237

## 2023-06-04 MED ORDER — LACTATED RINGERS IV BOLUS
1000.0000 mL | Freq: Once | INTRAVENOUS | Status: AC
  Administered 2023-06-04: 1000 mL via INTRAVENOUS

## 2023-06-04 MED ORDER — POTASSIUM CHLORIDE 10 MEQ/100ML IV SOLN
10.0000 meq | INTRAVENOUS | Status: AC
  Administered 2023-06-04 (×2): 10 meq via INTRAVENOUS
  Filled 2023-06-04 (×2): qty 100

## 2023-06-04 MED ORDER — CLONIDINE HCL 0.1 MG PO TABS
0.1000 mg | ORAL_TABLET | Freq: Four times a day (QID) | ORAL | Status: DC
  Filled 2023-06-04: qty 1

## 2023-06-04 MED ORDER — THIAMINE MONONITRATE 100 MG PO TABS
100.0000 mg | ORAL_TABLET | Freq: Every day | ORAL | Status: DC
  Administered 2023-06-05 – 2023-06-08 (×4): 100 mg via ORAL
  Filled 2023-06-04 (×4): qty 1

## 2023-06-04 MED ORDER — MAGNESIUM HYDROXIDE 400 MG/5ML PO SUSP: 15.0000 mL | Freq: Every day | ORAL | Status: DC | PRN

## 2023-06-04 MED ORDER — SODIUM CHLORIDE 0.9 % IV SOLN: INTRAVENOUS | Status: DC

## 2023-06-04 MED ORDER — ACETAMINOPHEN 325 MG PO TABS: 325.0000 mg | ORAL_TABLET | Freq: Four times a day (QID) | ORAL | Status: DC | PRN

## 2023-06-04 MED ORDER — LORAZEPAM 1 MG PO TABS
1.0000 mg | ORAL_TABLET | ORAL | Status: DC | PRN
  Administered 2023-06-05 (×2): 1 mg via ORAL
  Filled 2023-06-04 (×2): qty 1

## 2023-06-04 MED ORDER — CLONIDINE HCL 0.1 MG PO TABS: 0.1000 mg | ORAL_TABLET | ORAL | Status: DC

## 2023-06-04 MED ORDER — POTASSIUM CHLORIDE CRYS ER 20 MEQ PO TBCR
40.0000 meq | EXTENDED_RELEASE_TABLET | Freq: Once | ORAL | Status: AC
  Administered 2023-06-04: 40 meq via ORAL
  Filled 2023-06-04: qty 2

## 2023-06-04 MED ORDER — ALUM & MAG HYDROXIDE-SIMETH 200-200-20 MG/5ML PO SUSP: 15.0000 mL | ORAL | Status: DC | PRN

## 2023-06-04 MED ORDER — LOPERAMIDE HCL 2 MG PO CAPS
2.0000 mg | ORAL_CAPSULE | ORAL | Status: DC | PRN
  Administered 2023-06-04: 2 mg via ORAL
  Filled 2023-06-04: qty 1

## 2023-06-04 MED ORDER — METOCLOPRAMIDE HCL 5 MG/ML IJ SOLN
10.0000 mg | Freq: Once | INTRAMUSCULAR | Status: DC
  Filled 2023-06-04: qty 2

## 2023-06-04 MED ORDER — ADULT MULTIVITAMIN W/MINERALS CH
1.0000 | ORAL_TABLET | Freq: Every day | ORAL | Status: DC
  Administered 2023-06-04: 1 via ORAL
  Filled 2023-06-04: qty 1

## 2023-06-04 MED ORDER — NAPROXEN 500 MG PO TABS: 500.0000 mg | ORAL_TABLET | Freq: Two times a day (BID) | ORAL | Status: DC | PRN

## 2023-06-04 MED ORDER — ONDANSETRON HCL 4 MG/2ML IJ SOLN
4.0000 mg | Freq: Four times a day (QID) | INTRAMUSCULAR | Status: DC | PRN
  Administered 2023-06-06 – 2023-06-07 (×2): 4 mg via INTRAVENOUS
  Filled 2023-06-04 (×2): qty 2

## 2023-06-04 MED ORDER — DIPHENHYDRAMINE HCL 50 MG/ML IJ SOLN
25.0000 mg | Freq: Once | INTRAMUSCULAR | Status: DC
  Filled 2023-06-04: qty 1

## 2023-06-04 MED ORDER — THIAMINE HCL 100 MG/ML IJ SOLN: 100.0000 mg | Freq: Every day | INTRAMUSCULAR | Status: DC

## 2023-06-04 MED ORDER — LOPERAMIDE HCL 2 MG PO CAPS
4.0000 mg | ORAL_CAPSULE | ORAL | Status: DC | PRN
Start: 2023-06-04 — End: 2023-06-04

## 2023-06-04 MED ORDER — HYDROXYZINE HCL 25 MG PO TABS: 25.0000 mg | ORAL_TABLET | Freq: Four times a day (QID) | ORAL | Status: DC | PRN

## 2023-06-04 MED ORDER — CHLORDIAZEPOXIDE HCL 25 MG PO CAPS
25.0000 mg | ORAL_CAPSULE | Freq: Four times a day (QID) | ORAL | Status: DC | PRN
  Administered 2023-06-04: 25 mg via ORAL
  Filled 2023-06-04: qty 1

## 2023-06-04 MED ORDER — SODIUM CHLORIDE 0.9 % IV SOLN
1.0000 g | INTRAVENOUS | Status: DC
  Administered 2023-06-05: 1 g via INTRAVENOUS
  Filled 2023-06-04: qty 10

## 2023-06-04 MED ORDER — FOLIC ACID 1 MG PO TABS
1.0000 mg | ORAL_TABLET | Freq: Every day | ORAL | Status: DC
  Administered 2023-06-05 – 2023-06-08 (×4): 1 mg via ORAL
  Filled 2023-06-04 (×4): qty 1

## 2023-06-04 MED ORDER — LORAZEPAM 2 MG/ML IJ SOLN: 1.0000 mg | INTRAMUSCULAR | Status: DC | PRN

## 2023-06-04 MED ORDER — GABAPENTIN 100 MG PO CAPS
200.0000 mg | ORAL_CAPSULE | Freq: Two times a day (BID) | ORAL | Status: DC
  Administered 2023-06-04: 200 mg via ORAL
  Filled 2023-06-04: qty 2

## 2023-06-04 NOTE — Assessment & Plan Note (Signed)
Hx of polysubstance abuse -UDS in ED 9/30 positive for amphetamines, Benzos, cocaine, and THC -She was sent over by behavior health for worsening withdrawal symptoms  -start CIWA protocol q2hr PRN  -MTV, folic, thiamine supplement

## 2023-06-04 NOTE — Assessment & Plan Note (Addendum)
-  secondary to GI loss -Administered oral and IV potassium

## 2023-06-04 NOTE — ED Provider Notes (Signed)
[facility] Continuous Assessment Admission H&P  Date: 06/04/23 Patient Name: Gabrielle Nguyen MRN: [id number] Chief Complaint: " Detox"  Diagnoses:  Final diagnoses:  Polysubstance abuse (HCC)  Benzodiazepine withdrawal with complication (HCC)  Substance use disorder  Moderately severe major depression (HCC)  Substance induced mood disorder (HCC)    Gabrielle Nguyen is a 27 year old female with psychiatric history of substance abuse, suicidal ideation (2023), GAD, MDD, PTSD, and opioid use disorder, who is currently a direct transfer from [facility] where she was transferred for medical clearance yesterday due to worsening nausea, vomiting, not eating or drinking, and other concerning symptoms.  Patient was seen face to face by this provider and chart reviewed. Per chart review, patient has been back and forth to the ED and [facility] in the last 24-48 hours for medical clearance.  On evaluation, patient is alert, very pale, oriented x 3, and cooperative. Speech is slow, delayed with thought blocking. Pt appears dressed in hospital scrubs. Eye contact is staring. Mood is anxious, affect is congruent with mood. Thought process is delayed/blocked and loose and thought content is WDL. Pt denies SI/HI/AVH. There is no objective indication that the patient is responding to internal stimuli. No delusions elicited during this assessment.    On approach, patient appears very pale, and is only able to say a few words. Patient has ongoing thought blocking, with slow delayed answers. However, patient is able to confirm she is here seeking detox . UDS yesterday positive for Benzos, Amphetamines, Cocaine and THC.  Discussed recommendation to admit to continuous observation unit overnight for safety monitoring and re-eval in am for possible admission to the [facility] for substance abuse treatment. Patient verbalized her understanding and is in agreement.   Support, encouragement and reassurance provided about ongoing  stressors. Patient is provided with opportunity for questions.       Total Time spent with patient: 15 minutes  Musculoskeletal  Strength & Muscle Tone: decreased Gait & Station: normal Patient leans: N/A  Psychiatric Specialty Exam  Presentation General Appearance:  Other (comment) (in hospital scrubs)  Eye Contact: Other (comment) (Staring)  Speech: Slow; Blocked  Speech Volume: Decreased  Handedness: Right   Mood and Affect  Mood: Anxious  Affect: Congruent   Thought Process  Thought Processes: Other (comment) (Thought blocking)  Descriptions of Associations:Loose  Orientation:Partial  Thought Content:WDL  Diagnosis of Schizophrenia or Schizoaffective disorder in past: No data recorded Duration of Psychotic Symptoms: No data recorded Hallucinations:Hallucinations: None  Ideas of Reference:None  Suicidal Thoughts:Suicidal Thoughts: No  Homicidal Thoughts:Homicidal Thoughts: No   Sensorium  Memory: Immediate Poor  Judgment: Poor  Insight: Lacking   Executive Functions  Concentration: Poor  Attention Span: Poor  Recall: Poor  Fund of Knowledge: Poor  Language: Poor   Psychomotor Activity  Psychomotor Activity: Psychomotor Activity: Decreased   Assets  Assets: Communication Skills; Desire for Improvement   Sleep  Sleep: Sleep: Poor   Nutritional Assessment (For OBS and [facility]BC admissions only) Has the patient had a weight loss or gain of 10 pounds or more in the last 3 months?: No Has the patient had a decrease in food intake/or appetite?: No Does the patient have dental problems?: No Does the patient have eating habits or behaviors that may be indicators of an eating disorder including binging or inducing vomiting?: No Has the patient recently lost weight without trying?: 0 Has the patient been eating poorly because of a decreased appetite?: 0 Malnutrition Screening Tool Score: 0  Physical Exam Constitutional:       Appearance: She is ill-appearing.  HENT:     Right Ear: External ear normal.     Left Ear: External ear normal.     Nose: No congestion.  Eyes:     General:        Right eye: No discharge.        Left eye: No discharge.  Cardiovascular:     Rate and Rhythm: Normal rate.  Pulmonary:     Effort: No respiratory distress.  Chest:     Chest wall: No tenderness.  Skin:    Coloration: Skin is pale.  Neurological:     Mental Status: She is alert and oriented to person, place, and time.  Psychiatric:        Attention and Perception: Attention and perception normal.        Mood and Affect: Mood is anxious and depressed. Affect is flat.        Speech: Speech is delayed.        Behavior: Behavior is cooperative.        Thought Content: Thought content normal.    Review of Systems  Constitutional:  Negative for chills, diaphoresis and fever.  HENT:  Negative for congestion.   Eyes:  Negative for discharge.  Respiratory:  Negative for cough, shortness of breath and wheezing.   Cardiovascular:  Negative for chest pain and palpitations.  Gastrointestinal:  Negative for diarrhea, nausea and vomiting.  Neurological:  Negative for tingling, seizures, loss of consciousness and headaches.  Psychiatric/Behavioral:  Positive for substance abuse. The patient is nervous/anxious and has insomnia.     Blood pressure 115/82, pulse 88, temperature 98.8 F (37.1 C), temperature source Oral, resp. rate 18, SpO2 98%, unknown if currently breastfeeding. There is no height or weight on file to calculate BMI.  Past Psychiatric History: See H & P   Is the patient at risk to self? Yes  Has the patient been a risk to self in the past 6 months? Yes .    Has the patient been a risk to self within the distant past? Yes   Is the patient a risk to others? No   Has the patient been a risk to others in the past 6 months? No   Has the patient been a risk to others within the distant past? No   Past Medical  History: See Chart  Family History: N/A  Social History: N/A  Last Labs:  Admission on 06/03/2023, Discharged on 06/03/2023  Component Date Value Ref Range Status   Sodium 06/03/2023 139  135 - 145 mmol/L Final   Potassium 06/03/2023 3.1 (L)  3.5 - 5.1 mmol/L Final   Chloride 06/03/2023 106  98 - 111 mmol/L Final   CO2 06/03/2023 19 (L)  22 - 32 mmol/L Final   Glucose, Bld 06/03/2023 92  70 - 99 mg/dL Final   Glucose reference range applies only to samples taken after fasting for at least 8 hours.   BUN 06/03/2023 13  6 - 20 mg/dL Final   Creatinine, Ser 06/03/2023 0.63  0.44 - 1.00 mg/dL Final   Calcium 16/06/9603 9.4  8.9 - 10.3 mg/dL Final   Total Protein 54/05/8118 7.3  6.5 - 8.1 g/dL Final   Albumin 14/78/2956 4.2  3.5 - 5.0 g/dL Final   AST 21/30/8657 19  15 - 41 U/L Final   ALT 06/03/2023 13  0 - 44 U/L Final   Alkaline Phosphatase 06/03/2023 41  38 - 126 U/L Final   Total Bilirubin 06/03/2023 0.7  0.3 - 1.2 mg/dL Final   GFR, Estimated 06/03/2023 >60  >60 mL/min Final   Comment: (NOTE) Calculated using the CKD-EPI Creatinine Equation (2021)    Anion gap 06/03/2023 14  5 - 15 Final   Performed at [facility], [address], [place], [place] [address]   WBC 06/03/2023 6.7  4.0 - 10.5 K/uL Final   RBC 06/03/2023 4.74  3.87 - 5.11 MIL/uL Final   Hemoglobin 06/03/2023 11.6 (L)  12.0 - 15.0 g/dL Final   HCT 96/29/5284 37.1  36.0 - 46.0 % Final   MCV 06/03/2023 78.3 (L)  80.0 - 100.0 fL Final   MCH 06/03/2023 24.5 (L)  26.0 - 34.0 pg Final   MCHC 06/03/2023 31.3  30.0 - 36.0 g/dL Final   RDW 13/24/4010 15.1  11.5 - 15.5 % Final   Platelets 06/03/2023 248  150 - 400 K/uL Final   nRBC 06/03/2023 0.0  0.0 - 0.2 % Final   Neutrophils Relative % 06/03/2023 65  % Final   Neutro Abs 06/03/2023 4.4  1.7 - 7.7 K/uL Final   Lymphocytes Relative 06/03/2023 27  % Final   Lymphs Abs 06/03/2023 1.8  0.7 - 4.0 K/uL Final   Monocytes Relative 06/03/2023 7  % Final   Monocytes  Absolute 06/03/2023 0.5  0.1 - 1.0 K/uL Final   Eosinophils Relative 06/03/2023 0  % Final   Eosinophils Absolute 06/03/2023 0.0  0.0 - 0.5 K/uL Final   Basophils Relative 06/03/2023 1  % Final   Basophils Absolute 06/03/2023 0.0  0.0 - 0.1 K/uL Final   Immature Granulocytes 06/03/2023 0  % Final   Abs Immature Granulocytes 06/03/2023 0.02  0.00 - 0.07 K/uL Final   Performed at [facility], [address] [place], [place] [address]   Magnesium 06/03/2023 2.5 (H)  1.7 - 2.4 mg/dL Final   Performed at [facility], [address] [place], [place] [address]  Admission on 06/02/2023, Discharged on 06/03/2023  Component Date Value Ref Range Status   Sodium 06/02/2023 137  135 - 145 mmol/L Final   Potassium 06/02/2023 3.2 (L)  3.5 - 5.1 mmol/L Final   Chloride 06/02/2023 103  98 - 111 mmol/L Final   CO2 06/02/2023 18 (L)  22 - 32 mmol/L Final   Glucose, Bld 06/02/2023 109 (H)  70 - 99 mg/dL Final   Glucose reference range applies only to samples taken after fasting for at least 8 hours.   BUN 06/02/2023 15  6 - 20 mg/dL Final   Creatinine, Ser 06/02/2023 0.68  0.44 - 1.00 mg/dL Final   Calcium 34/74/2595 9.7  8.9 - 10.3 mg/dL Final   Total Protein 63/87/5643 8.2 (H)  6.5 - 8.1 g/dL Final   Albumin 32/95/1884 4.6  3.5 - 5.0 g/dL Final   AST 16/60/6301 16  15 - 41 U/L Final   ALT 06/02/2023 14  0 - 44 U/L Final   Alkaline Phosphatase 06/02/2023 41  38 - 126 U/L Final   Total Bilirubin 06/02/2023 0.6  0.3 - 1.2 mg/dL Final   GFR, Estimated 06/02/2023 >60  >60 mL/min Final   Comment: (NOTE) Calculated using the CKD-EPI Creatinine Equation (2021)    Anion gap 06/02/2023 16 (H)  5 - 15 Final   Performed at [facility], [address] [place], [place] [address]   Alcohol, Ethyl (B) 06/02/2023 <10  <  10 mg/dL Final   Comment: (NOTE) Lowest detectable limit for serum alcohol is 10 mg/dL.  For medical purposes only. Performed at [facility], [address]  [place], [place] [address]    Opiates 06/03/2023 NONE DETECTED  NONE DETECTED Final   Cocaine 06/03/2023 POSITIVE (A)  NONE DETECTED Final   Benzodiazepines 06/03/2023 POSITIVE (A)  NONE DETECTED Final   Amphetamines 06/03/2023 POSITIVE (A)  NONE DETECTED Final   Tetrahydrocannabinol 06/03/2023 POSITIVE (A)  NONE DETECTED Final   Barbiturates 06/03/2023 NONE DETECTED  NONE DETECTED Final   Comment: (NOTE) DRUG SCREEN FOR MEDICAL PURPOSES ONLY.  IF CONFIRMATION IS NEEDED FOR ANY PURPOSE, NOTIFY LAB WITHIN 5 DAYS.  LOWEST DETECTABLE LIMITS FOR URINE DRUG SCREEN Drug Class                     Cutoff (ng/mL) Amphetamine and metabolites    1000 Barbiturate and metabolites    200 Benzodiazepine                 200 Opiates and metabolites        300 Cocaine and metabolites        300 THC                            50 Performed at [facility], [address] [place], [place] [address]    WBC 06/02/2023 10.9 (H)  4.0 - 10.5 K/uL Final   RBC 06/02/2023 5.10  3.87 - 5.11 MIL/uL Final   Hemoglobin 06/02/2023 12.4  12.0 - 15.0 g/dL Final   HCT 09/81/1914 39.8  36.0 - 46.0 % Final   MCV 06/02/2023 78.0 (L)  80.0 - 100.0 fL Final   MCH 06/02/2023 24.3 (L)  26.0 - 34.0 pg Final   MCHC 06/02/2023 31.2  30.0 - 36.0 g/dL Final   RDW 78/29/5621 14.8  11.5 - 15.5 % Final   Platelets 06/02/2023 307  150 - 400 K/uL Final   nRBC 06/02/2023 0.0  0.0 - 0.2 % Final   Neutrophils Relative % 06/02/2023 85  % Final   Neutro Abs 06/02/2023 9.2 (H)  1.7 - 7.7 K/uL Final   Lymphocytes Relative 06/02/2023 10  % Final   Lymphs Abs 06/02/2023 1.1  0.7 - 4.0 K/uL Final   Monocytes Relative 06/02/2023 5  % Final   Monocytes Absolute 06/02/2023 0.5  0.1 - 1.0 K/uL Final   Eosinophils Relative 06/02/2023 0  % Final   Eosinophils Absolute 06/02/2023 0.0  0.0 - 0.5 K/uL Final   Basophils Relative 06/02/2023 0  % Final   Basophils Absolute 06/02/2023 0.0  0.0 - 0.1 K/uL Final   Immature Granulocytes 06/02/2023  0  % Final   Abs Immature Granulocytes 06/02/2023 0.03  0.00 - 0.07 K/uL Final   Performed at [facility], [address], [place], [place] [address]   Preg, Serum 06/02/2023 NEGATIVE  NEGATIVE Final   Comment:        THE SENSITIVITY OF THIS METHODOLOGY IS >10 mIU/mL. Performed at [facility], [address] [place], [place] [address]    Glucose-Capillary 06/02/2023 93  70 - 99 mg/dL Final   Glucose reference range applies only to samples taken after fasting for at least 8 hours.   Acetaminophen (Tylenol), Serum 06/02/2023 <10 (L)  10 - 30 ug/mL Final   Comment: (NOTE) Therapeutic concentrations vary significantly. A range of  10-30 ug/mL  may be an effective concentration for many patients. However, some  are best treated at concentrations outside of this range. Acetaminophen concentrations >150 ug/mL at 4 hours after ingestion  and >50 ug/mL at 12 hours after ingestion are often associated with  toxic reactions.  Performed at [facility], [address] [place], [place] [address]    Salicylate Lvl 06/02/2023 <7.0 (L)  7.0 - 30.0 mg/dL Final   Performed at [facility], [address], [place], [place] [address]   Specimen Source 06/03/2023 URINE, CLEAN CATCH   Final   Color, Urine 06/03/2023 YELLOW  YELLOW Final   APPearance 06/03/2023 HAZY (A)  CLEAR Final   Specific Gravity, Urine 06/03/2023 1.030  1.005 - 1.030 Final   pH 06/03/2023 5.0  5.0 - 8.0 Final   Glucose, UA 06/03/2023 NEGATIVE  NEGATIVE mg/dL Final   Hgb urine dipstick 06/03/2023 LARGE (A)  NEGATIVE Final   Bilirubin Urine 06/03/2023 NEGATIVE  NEGATIVE Final   Ketones, ur 06/03/2023 80 (A)  NEGATIVE mg/dL Final   Protein, ur 86/57/8469 30 (A)  NEGATIVE mg/dL Final   Nitrite 62/95/2841 NEGATIVE  NEGATIVE Final   Leukocytes,Ua 06/03/2023 SMALL (A)  NEGATIVE Final   RBC / HPF 06/03/2023 11-20  0 - 5 RBC/hpf Final   WBC, UA 06/03/2023 21-50  0 - 5 WBC/hpf Final   Comment:        Reflex  urine culture not performed if WBC <=10, OR if Squamous epithelial cells >5. If Squamous epithelial cells >5 suggest recollection.    Bacteria, UA 06/03/2023 MANY (A)  NONE SEEN Final   Squamous Epithelial / HPF 06/03/2023 6-10  0 - 5 /HPF Final   Mucus 06/03/2023 PRESENT   Final   Performed at [facility], [address] [place], [place] [address]    Allergies: Patient has no known allergies.  Medications:  Facility Ordered Medications  Medication   alum & mag hydroxide-simeth (MAALOX/MYLANTA) 200-200-20 MG/5ML suspension 15 mL   magnesium hydroxide (MILK OF MAGNESIA) suspension 15 mL   dicyclomine (BENTYL) tablet 20 mg   hydrOXYzine (ATARAX) tablet 25 mg   loperamide (IMODIUM) capsule 2-4 mg   methocarbamol (ROBAXIN) tablet 500 mg   naproxen (NAPROSYN) tablet 500 mg   cloNIDine (CATAPRES) tablet 0.1 mg   Followed by   Melene Muller ON 06/06/2023] cloNIDine (CATAPRES) tablet 0.1 mg   Followed by   Melene Muller ON 06/08/2023] cloNIDine (CATAPRES) tablet 0.1 mg   feeding supplement (ENSURE ENLIVE / ENSURE PLUS) liquid 237 mL   multivitamin with minerals tablet 1 tablet   melatonin tablet 3 mg   PTA Medications  Medication Sig   albuterol (VENTOLIN HFA) 108 (90 Base) MCG/ACT inhaler Inhale 2 puffs into the lungs every 6 (six) hours as needed for wheezing or shortness of breath.      Medical Decision Making  Recommend admit to the continuous obs unit for safety monitoring and re-eval in am for possible admit to the [facility] for substance abuse treatment/detox. Patient may be appropriate for the [facility].   Lab Orders         Potassium      I have reviewed the labs and medications completed at the ED and per EDP:. Labs in the ED notable for mild hypokalemia and she has had repletion with normal saline with potassium. I have reordered [facility] lab for am.  Patient improved clinically with benzodiazepine while at the ED with administration of Ativan 0.5 mg  IV @1848  and Ativan 1 mg IV@2129 . See ED  Mar for total fluid boluses.   Recommend COWS protocol  Other meds --Tylenol 325 mg PO q6h, prn pain, headache -Maalox 30 ml PO q4h, prn indigestion -MOM 30 ml PO PO daily, prn, constipation -MVI with minerals for nutritional support -Ensure Enlive 237 ml. PO, TID between meals for nutritional support  Recommendations  Based on my evaluation the patient does not appear to have an emergency medical condition.  Recommend admit to the continuous obs unit for safety monitoring and re-eval in am. Patient may be appropriate for the [facility]. Recommend COWS protocol. Recommend Nutritional supplements.   Mancel Bale, NP 06/04/23  1:29 AM
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
## 2023-06-04 NOTE — ED Provider Notes (Signed)
[place] EMERGENCY DEPARTMENT AT [facility] Provider Note   CSN: [id number] Arrival date & time: 06/04/23  1717     History {Add pertinent medical, surgical, social history, OB history to HPI:1} Chief Complaint  Patient presents with   Withdrawal    Gabrielle Nguyen is a 27 y.o. female.  HPI 27 year old female history of polysubstance abuse, depression, anxiety, asthma presenting for hypokalemia.  Patient initially presented to the ED September 30 for abnormal behavior and concern for withdrawal.  She had taken Xanax for a long time.  She was initially altered but then discharged after she returned to baseline.  Yesterday she was seen in behavioral health urgent care and had a workup notable for mild anion gap elevation as well as mild hypokalemia.  She was seen at [facility] ED yesterday for hypokalemia.  She was given Ativan, potassium transfer back to behavioral health urgent care.  Urgent care wanted to admit her voluntarily for opioid and benzodiazepine withdrawal.  Per chart review appears she has been getting loperamide and Librium as well as gabapentin at the behavioral health urgent care.  She was sent back here today due to continued hypokalemia.  She states she feels like she is still withdrawing from Xanax.  She had multiple sudden nonbloody and bilious emesis.  No chest pain, shortness of breath, abdominal pain.  No fevers or chills.  She has had some nonbloody diarrhea.  Used to take Xanax as well as multiple opioids.     Home Medications Prior to Admission medications   Medication Sig Start Date End Date Taking? Authorizing Provider  albuterol (VENTOLIN HFA) 108 (90 Base) MCG/ACT inhaler Inhale 2 puffs into the lungs every 6 (six) hours as needed for wheezing or shortness of breath. 01/27/22   Hermelinda Dellen, MD      Allergies    Patient has no known allergies.    Review of Systems   Review of Systems Review of systems completed and notable as per HPI.  ROS  otherwise negative.   Physical Exam Updated Vital Signs BP 125/83   Pulse 91   Temp 98.3 F (36.8 C) (Oral)   Resp 16   Ht 5\' 5"  (1.651 m)   Wt 53.8 kg   SpO2 100%   BMI 19.74 kg/m  Physical Exam Vitals and nursing note reviewed.  Constitutional:      General: She is not in acute distress.    Appearance: She is well-developed. She is diaphoretic.  HENT:     Head: Normocephalic and atraumatic.     Mouth/Throat:     Mouth: Mucous membranes are moist.     Pharynx: Oropharynx is clear.  Eyes:     Extraocular Movements: Extraocular movements intact.     Conjunctiva/sclera: Conjunctivae normal.     Pupils: Pupils are equal, round, and reactive to light.  Cardiovascular:     Rate and Rhythm: Normal rate and regular rhythm.     Heart sounds: No murmur heard. Pulmonary:     Effort: Pulmonary effort is normal. No respiratory distress.     Breath sounds: Normal breath sounds.  Abdominal:     Palpations: Abdomen is soft.     Tenderness: There is no abdominal tenderness. There is no guarding or rebound.  Musculoskeletal:        General: No swelling.     Cervical back: Neck supple.     Right lower leg: No edema.     Left lower leg: No edema.  Skin:  General: Skin is warm.     Capillary Refill: Capillary refill takes less than 2 seconds.  Neurological:     General: No focal deficit present.     Mental Status: She is alert and oriented to person, place, and time. Mental status is at baseline.     Cranial Nerves: No cranial nerve deficit.     Sensory: No sensory deficit.     Motor: No weakness.  Psychiatric:        Mood and Affect: Mood normal.     ED Results / Procedures / Treatments   Labs (all labs ordered are listed, but only abnormal results are displayed) Labs Reviewed - No data to display  EKG None  Radiology DG Chest 1 View  Result Date: 06/02/2023 CLINICAL DATA:  Tachypnea. EXAM: CHEST  1 VIEW COMPARISON:  10/04/2020 FINDINGS: The cardiomediastinal  contours are normal. The lungs are clear. Pulmonary vasculature is normal. No consolidation, pleural effusion, or pneumothorax. No acute osseous abnormalities are seen. Overlying jewelry artifacts at the apices. Bilateral nipple shadows. IMPRESSION: No acute chest findings. Electronically Signed   By: Narda Rutherford M.D.   On: 06/02/2023 21:09    Procedures Procedures  {Document cardiac monitor, telemetry assessment procedure when appropriate:1}  Medications Ordered in ED Medications - No data to display  ED Course/ Medical Decision Making/ A&P   {   Click here for ABCD2, HEART and other calculatorsREFRESH Note before signing :1}                              Medical Decision Making  Medical Decision Making:   Gabrielle Nguyen is a 27 y.o. female who presented to the ED today with hypokalemia.  Behavioral urgent care called and said they were concerned due to continued nausea and vomiting as well as hypokalemia.  Labs today had potassium 3.2.  Here she is somewhat diaphoretic, still feels like she is withdrawing.  She is not tachycardic, hypertensive but does appear uncomfortable likely related to opioid and benzodiazepine withdrawal.  Abdominal exam is benign, low concern for obstruction or other acute intra-abdominal pathology.  Will plan to rehydrate here, treat symptomatically and replete electrolytes as needed.  She is here voluntarily for withdrawal, denies SI, HI or AVH.  I do not think she needs IVC.   {crccomplexity:27900} Reviewed and confirmed nursing documentation for past medical history, family history, social history.  Reassessment and Plan:   ***    Patient's presentation is most consistent with {EM COPA:27473}     {Document critical care time when appropriate:1} {Document review of labs and clinical decision tools ie heart score, Chads2Vasc2 etc:1}  {Document your independent review of radiology images, and any outside records:1} {Document your discussion with family  members, caretakers, and with consultants:1} {Document social determinants of health affecting pt's care:1} {Document your decision making why or why not admission, treatments were needed:1} Final Clinical Impression(s) / ED Diagnoses Final diagnoses:  None    Rx / DC Orders ED Discharge Orders     None

## 2023-06-04 NOTE — ED Notes (Signed)
Pt. Resting in bed at the current c eyes closed. No s/s of acute distress, pain or any discomfort at this time. VSS. Safety maintained. Will continue to monitor and report any COC.

## 2023-06-04 NOTE — ED Notes (Signed)
Report called to [facility] RN @ [facility]. Non emergent EMS called. Will continue to monitor for safety and report any COC.

## 2023-06-04 NOTE — ED Notes (Signed)
Pt returns as transfer from [facility] ED for medical clearance and report of bizarre behavior, catatonia.  Pt appears pale in color and drooling also. Pt able to follow instructions.  Pt requesting Detox.  Awake, alert & responsive.  Comfort measures given.  Monitoring for safety.

## 2023-06-04 NOTE — ED Triage Notes (Signed)
Patient sent from [facility] for body aches, incontinence, delayed responses, and withdrawal from opiates and benzos. Patient was voluntarily at [facility] for withdrawal. She has been sent to the hospital 3x since she arrived at [facility] for low K+.

## 2023-06-04 NOTE — ED Notes (Addendum)
Pt observed staring off into space & drooling. Not easily arroused or verbally responding to this nurse. Pt states that she is very tired and weak. Provider notified. Will continue to monitor and report any COC.

## 2023-06-04 NOTE — ED Notes (Signed)
Report given to EMS. D/t long wait times at [facility], pt transported to [facility] for shorter waiting period. Attempted to call report to [facility] charge nurse, but unable to get an answer.
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
## 2023-06-04 NOTE — ED Notes (Addendum)
Pt having episodes of n/v this shift. Unable to keep food down, not tolerating anything. Provider notified.

## 2023-06-04 NOTE — ED Notes (Signed)
Pt unable to alert this nurse to use bathroom during this shift. Pt incontinent of urine and stool at this time. Pt was facing towards the wall when this nurse asked her if she had used the bathroom and she stated she had already gone on herself. Pt voiced understanding of alerting nurse and techs for the bathroom. Pt still unable to alert staff on the unit that she had to use the bathroom. Provider notified. Pt cleaned up and redressed. Imodium and Librium given per provider. Will continue to monitor for safety and report any COC.

## 2023-06-04 NOTE — Assessment & Plan Note (Signed)
-  reports dysuria and suprapubic pain -UA is pending -check Urine pregnancy -start IV Rocephin

## 2023-06-04 NOTE — Progress Notes (Signed)
LCSW Progress Note  [id number]   Gabrielle Nguyen  06/04/2023  1:33 PM  Description:   Inpatient Psychiatric Referral  Patient was recommended inpatient per Joaquin Courts, NP. There are no available beds at [facility], per [facility] Malva Limes, RN. Patient was referred to the following out of network facilities:   Destination  Service Provider Address Phone Fax  [facility] [address]  [address] [place] [address] [phone]  [facility]  [address] [place] [address] [phone]  [facility]  [address] [place], [place] [address] [phone]  [facility]  [address] [place] [address] [phone]  [facility]  [address] [place], [place] [address] [phone]  [facility]  [address], [place] [address] [phone]  [facility]  [address] [place], [place] [address] [phone]  [facility]  [address] [place] [address] [phone]  [facility]  [address] [place] [address] [phone]  [facility]  [address] [place] [address] [phone]  [facility]  [address], [place] [address] [phone]  [facility]  [address], [place] [address] [phone]  [facility]  [address] [place] [address] [phone]  [facility]  [place] [phone]  [facility] EFAX  [address] [place], [place] [phone]  [facility]  [address], [place] [address]  [phone]  [facility]  [address], [place] [address] [phone]  [facility]  [address] [place], [place] [address] [phone]  [facility]  [address] [place] [address] [phone]  [facility] EFAX  [place] [phone]    Situation ongoing, CSW to continue following and update chart as more information becomes available.      Cathie Beams, LCSW  06/04/2023 1:33 PM

## 2023-06-04 NOTE — ED Notes (Signed)
EMS here to transport pt. Pt alert & stable.

## 2023-06-04 NOTE — Discharge Instructions (Signed)
Transfer [facility]

## 2023-06-05 DIAGNOSIS — F19939 Other psychoactive substance use, unspecified with withdrawal, unspecified: Secondary | ICD-10-CM | POA: Diagnosis not present

## 2023-06-05 LAB — BASIC METABOLIC PANEL
Anion gap: 11 (ref 5–15)
BUN: 11 mg/dL (ref 6–20)
CO2: 20 mmol/L — ABNORMAL LOW (ref 22–32)
Calcium: 8.2 mg/dL — ABNORMAL LOW (ref 8.9–10.3)
Chloride: 103 mmol/L (ref 98–111)
Creatinine, Ser: 0.52 mg/dL (ref 0.44–1.00)
GFR, Estimated: >60 mL/min (ref >60)
Glucose, Bld: 98 mg/dL (ref 70–99)
Potassium: 2.9 mmol/L — ABNORMAL LOW (ref 3.5–5.1)
Sodium: 134 mmol/L — ABNORMAL LOW (ref 135–145)

## 2023-06-05 LAB — HIV ANTIBODY (ROUTINE TESTING W REFLEX): HIV Screen 4th Generation wRfx: NONREACTIVE

## 2023-06-05 LAB — CBC
HCT: 32.7 % — ABNORMAL LOW (ref 36.0–46.0)
Hemoglobin: 10.2 g/dL — ABNORMAL LOW (ref 12.0–15.0)
MCH: 25.1 pg — ABNORMAL LOW (ref 26.0–34.0)
MCHC: 31.2 g/dL (ref 30.0–36.0)
MCV: 80.5 fL (ref 80.0–100.0)
Platelets: 198 10*3/uL (ref 150–400)
RBC: 4.06 MIL/uL (ref 3.87–5.11)
RDW: 14.7 % (ref 11.5–15.5)
WBC: 4.6 10*3/uL (ref 4.0–10.5)
nRBC: 0 % (ref 0.0–0.2)

## 2023-06-05 MED ORDER — POTASSIUM CHLORIDE CRYS ER 20 MEQ PO TBCR
40.0000 meq | EXTENDED_RELEASE_TABLET | ORAL | Status: DC
  Filled 2023-06-05: qty 2

## 2023-06-05 MED ORDER — SODIUM CHLORIDE 0.9 % IV SOLN: INTRAVENOUS | Status: DC

## 2023-06-05 MED ORDER — INFLUENZA VIRUS VACC SPLIT PF (FLUZONE) 0.5 ML IM SUSY
0.5000 mL | PREFILLED_SYRINGE | INTRAMUSCULAR | Status: DC
  Filled 2023-06-05: qty 0.5

## 2023-06-05 MED ORDER — LORAZEPAM 1 MG PO TABS
1.0000 mg | ORAL_TABLET | ORAL | Status: AC | PRN
  Administered 2023-06-06 – 2023-06-07 (×2): 2 mg via ORAL
  Administered 2023-06-07 (×2): 1 mg via ORAL
  Filled 2023-06-05: qty 1
  Filled 2023-06-05 (×2): qty 2
  Filled 2023-06-05: qty 1

## 2023-06-05 MED ORDER — POTASSIUM CHLORIDE 20 MEQ PO PACK
40.0000 meq | PACK | ORAL | Status: AC
  Administered 2023-06-05 (×2): 40 meq via ORAL
  Filled 2023-06-05 (×2): qty 2

## 2023-06-05 MED ORDER — LORAZEPAM 2 MG/ML IJ SOLN
1.0000 mg | INTRAMUSCULAR | Status: AC | PRN
  Administered 2023-06-05: 1 mg via INTRAVENOUS
  Administered 2023-06-06 – 2023-06-07 (×5): 2 mg via INTRAVENOUS
  Administered 2023-06-07 (×2): 1 mg via INTRAVENOUS
  Filled 2023-06-05 (×8): qty 1
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
## 2023-06-05 NOTE — BH Specialist Note (Signed)
Fe [place]-Transfer to ED Patient was evaluated on the unit given she has continued to vomit and have periods of incontinence.  On evaluation patient not tolerate anything by mouth and she continues to look rather pale.  Contacted EDP at [facility] who originally accepted patient Dr. Etheleen Nicks, however upon arrival of EMS this writer was told that the ED no longer has any beds as they are currently working on trauma.  Report was called over to [facility] emergency department doctor reality accepting provider and agreed to accept patient for medical reevaluation, IV replenishment of potassium, and fluid administration given patient is not able to tolerate anything by mouth.  Patient stable and is transported to [facility] Emergency Department.

## 2023-06-05 NOTE — Progress Notes (Signed)
PROGRESS NOTE    Gabrielle Nguyen  ZOX:[id number] DOB: [birth year] DOA: 06/04/2023 PCP: Pcp, No     Brief Narrative:  Gabrielle Nguyen is a 27 y.o. female with medical history significant of polysubstance abuse, takotsubo cardiomyopathy, PTSD, depression and anxiety who presents with hypokalemia, nausea, vomiting and diarrhea.   [facility]23 complaining of withdrawal from unknown substance and was sent to ED for medical clearance. In the ED, UDS was positive for amphetamines, Benzos, cocaine, and THC. She was found to have hypokalemia of 3.1 and elevated anion gap but otherwise deemed stable and discharged. She then re-presented to [facility] on 06/03/2023 and was not able to provide meaningful history. Boyfriend reports patient previously prescribed Klonopin at least a year ago and started to take Xanax off the street in addition to other substances. She was again noted to have hypokalemia and treated with oral supplementation and she was placed on COWS protocol. Also started on Macrobid for UTI.   New events last 24 hours / Subjective: Patient seen in the emergency department.  Still having some nausea but no further vomiting.  Able to keep down food yesterday.  Admitted to some lower abdominal pain.  Assessment & Plan:  Principal Problem:   Substance withdrawal (HCC) Active Problems:   Polysubstance abuse (HCC)   Hypokalemia   UTI (urinary tract infection)   Substance withdrawal -UDS in ED 9/30 positive for amphetamines, Benzos, cocaine, and THC -Supportive care, CIWA   UTI ruled out -UA unremarkable  Hypokalemia -Replace   DVT prophylaxis:  enoxaparin (LOVENOX) injection 40 mg Start: 06/05/23 1000  Code Status: Full Family Communication: None at bedside Disposition Plan: Home Status is: Observation The patient will require care spanning > 2 midnights and should be moved to inpatient because: electrolyte abnl     Antimicrobials:   Anti-infectives (From admission, onward)    Start     Dose/Rate Route Frequency Ordered Stop   06/04/23 2300  cefTRIAXone (ROCEPHIN) 1 g in sodium chloride 0.9 % 100 mL IVPB  Status:  Discontinued        1 g 200 mL/hr over 30 Minutes Intravenous Every 24 hours 06/04/23 2242 06/05/23 0751        Objective: Vitals:   06/05/23 0400 06/05/23 0430 06/05/23 0457 06/05/23 0830  BP: 108/74  112/63 123/78  Pulse: (!) 59  63 61  Resp:   (!) 25 13  Temp:  98.6 F (37 C)  98.3 F (36.8 C)  TempSrc:  Oral  Oral  SpO2:   95% 99%  Weight:      Height:       No intake or output data in the 24 hours ending 06/05/23 1122 Filed Weights   06/04/23 1732  Weight: 53.8 kg    Examination:  General exam: Appears calm and comfortable  Respiratory system: Clear to auscultation. Respiratory effort normal. No respiratory distress. No conversational dyspnea.  Cardiovascular system: S1 & S2 heard, RRR. No murmurs. No pedal edema. Gastrointestinal system: Abdomen is nondistended, soft  Central nervous system: Alert  Extremities: Symmetric in appearance  Skin: No rashes, lesions or ulcers on exposed skin   Data Reviewed: I have personally reviewed following labs and imaging studies  CBC: Recent Labs  Lab 06/02/23 2028 06/03/23 1837 06/04/23 1110 06/04/23 1845 06/05/23 0545  WBC 10.9* 6.7 5.0 4.9 4.6  NEUTROABS 9.2* 4.4 3.0 2.4  --   HGB 12.4 11.6* 10.5* 11.1* 10.2*  HCT 39.8 37.1 33.8* 35.9*  32.7*  MCV 78.0* 78.3* 80.5 80.9 80.5  PLT 307 248 228 238 198   Basic Metabolic Panel: Recent Labs  Lab 06/02/23 2028 06/03/23 1837 06/04/23 1110 06/04/23 1845 06/05/23 0545  NA 137 139 138 136 134*  K 3.2* 3.1* 3.2* 2.9* 2.9*  CL 103 106 104 105 103  CO2 18* 19* 20* 21* 20*  GLUCOSE 109* 92 103* 82 98  BUN 15 13 13 12 11   CREATININE 0.68 0.63 0.64 0.46 0.52  CALCIUM 9.7 9.4 9.2 9.1 8.2*  MG  --  2.5*  --  2.4  --    GFR: Estimated Creatinine Clearance: 88.9 mL/min (by C-G formula  based on SCr of 0.52 mg/dL). Liver Function Tests: Recent Labs  Lab 06/02/23 2028 06/03/23 1837 06/04/23 1845  AST 16 19 16   ALT 14 13 11   ALKPHOS 41 41 34*  BILITOT 0.6 0.7 0.7  PROT 8.2* 7.3 7.2  ALBUMIN 4.6 4.2 4.3   No results for input(s): "LIPASE", "AMYLASE" in the last 168 hours. No results for input(s): "AMMONIA" in the last 168 hours. Coagulation Profile: No results for input(s): "INR", "PROTIME" in the last 168 hours. Cardiac Enzymes: No results for input(s): "CKTOTAL", "CKMB", "CKMBINDEX", "TROPONINI" in the last 168 hours. BNP (last 3 results) No results for input(s): "PROBNP" in the last 8760 hours. HbA1C: No results for input(s): "HGBA1C" in the last 72 hours. CBG: Recent Labs  Lab 06/02/23 2043  GLUCAP 93   Lipid Profile: No results for input(s): "CHOL", "HDL", "LDLCALC", "TRIG", "CHOLHDL", "LDLDIRECT" in the last 72 hours. Thyroid Function Tests: No results for input(s): "TSH", "T4TOTAL", "FREET4", "T3FREE", "THYROIDAB" in the last 72 hours. Anemia Panel: Recent Labs    06/04/23 1110  IRON 30   Sepsis Labs: No results for input(s): "PROCALCITON", "LATICACIDVEN" in the last 168 hours.  No results found for this or any previous visit (from the past 240 hour(s)).    Radiology Studies: No results found.    Scheduled Meds:  enoxaparin (LOVENOX) injection  40 mg Subcutaneous Q24H   folic acid  1 mg Oral Daily   potassium chloride  40 mEq Oral Q4H   thiamine  100 mg Oral Daily   Or   thiamine  100 mg Intravenous Daily   Continuous Infusions:  sodium chloride 100 mL/hr at 06/04/23 2310     LOS: 0 days   Time spent: 25 minutes   Noralee Stain, DO [facility] 06/05/2023, 11:22 AM   Available via Epic secure chat 7am-7pm After these hours, please refer to coverage provider listed on [email or web address]

## 2023-06-06 DIAGNOSIS — F1413 Cocaine abuse, unspecified with withdrawal: Secondary | ICD-10-CM | POA: Diagnosis present

## 2023-06-06 DIAGNOSIS — Z79899 Other long term (current) drug therapy: Secondary | ICD-10-CM | POA: Diagnosis not present

## 2023-06-06 DIAGNOSIS — F13231 Sedative, hypnotic or anxiolytic dependence with withdrawal delirium: Secondary | ICD-10-CM | POA: Diagnosis present

## 2023-06-06 DIAGNOSIS — Z813 Family history of other psychoactive substance abuse and dependence: Secondary | ICD-10-CM | POA: Diagnosis not present

## 2023-06-06 DIAGNOSIS — Z5941 Food insecurity: Secondary | ICD-10-CM | POA: Diagnosis not present

## 2023-06-06 DIAGNOSIS — F419 Anxiety disorder, unspecified: Secondary | ICD-10-CM | POA: Diagnosis present

## 2023-06-06 DIAGNOSIS — Z5986 Financial insecurity: Secondary | ICD-10-CM | POA: Diagnosis not present

## 2023-06-06 DIAGNOSIS — F913 Oppositional defiant disorder: Secondary | ICD-10-CM | POA: Diagnosis present

## 2023-06-06 DIAGNOSIS — Z8249 Family history of ischemic heart disease and other diseases of the circulatory system: Secondary | ICD-10-CM | POA: Diagnosis not present

## 2023-06-06 DIAGNOSIS — J45909 Unspecified asthma, uncomplicated: Secondary | ICD-10-CM | POA: Diagnosis present

## 2023-06-06 DIAGNOSIS — Z5982 Transportation insecurity: Secondary | ICD-10-CM | POA: Diagnosis not present

## 2023-06-06 DIAGNOSIS — R112 Nausea with vomiting, unspecified: Secondary | ICD-10-CM | POA: Diagnosis present

## 2023-06-06 DIAGNOSIS — I4510 Unspecified right bundle-branch block: Secondary | ICD-10-CM | POA: Diagnosis present

## 2023-06-06 DIAGNOSIS — Z818 Family history of other mental and behavioral disorders: Secondary | ICD-10-CM | POA: Diagnosis not present

## 2023-06-06 DIAGNOSIS — R32 Unspecified urinary incontinence: Secondary | ICD-10-CM | POA: Diagnosis present

## 2023-06-06 DIAGNOSIS — F19939 Other psychoactive substance use, unspecified with withdrawal, unspecified: Secondary | ICD-10-CM | POA: Diagnosis not present

## 2023-06-06 DIAGNOSIS — D649 Anemia, unspecified: Secondary | ICD-10-CM | POA: Diagnosis present

## 2023-06-06 DIAGNOSIS — Z8619 Personal history of other infectious and parasitic diseases: Secondary | ICD-10-CM | POA: Diagnosis not present

## 2023-06-06 DIAGNOSIS — F1513 Other stimulant abuse with withdrawal: Secondary | ICD-10-CM | POA: Diagnosis present

## 2023-06-06 DIAGNOSIS — E876 Hypokalemia: Secondary | ICD-10-CM | POA: Diagnosis present

## 2023-06-06 DIAGNOSIS — F4310 Post-traumatic stress disorder, unspecified: Secondary | ICD-10-CM | POA: Diagnosis present

## 2023-06-06 DIAGNOSIS — N390 Urinary tract infection, site not specified: Secondary | ICD-10-CM | POA: Diagnosis present

## 2023-06-06 DIAGNOSIS — F1213 Cannabis abuse with withdrawal: Secondary | ICD-10-CM | POA: Diagnosis present

## 2023-06-06 LAB — MAGNESIUM: Magnesium: 2.1 mg/dL (ref 1.7–2.4)

## 2023-06-06 LAB — BASIC METABOLIC PANEL
Anion gap: 11 (ref 5–15)
BUN: 9 mg/dL (ref 6–20)
CO2: 22 mmol/L (ref 22–32)
Calcium: 8.6 mg/dL — ABNORMAL LOW (ref 8.9–10.3)
Chloride: 104 mmol/L (ref 98–111)
Creatinine, Ser: 0.47 mg/dL (ref 0.44–1.00)
GFR, Estimated: >60 mL/min (ref >60)
Glucose, Bld: 105 mg/dL — ABNORMAL HIGH (ref 70–99)
Potassium: 2.7 mmol/L — CL (ref 3.5–5.1)
Sodium: 137 mmol/L (ref 135–145)

## 2023-06-06 MED ORDER — POTASSIUM CHLORIDE 20 MEQ PO PACK
40.0000 meq | PACK | ORAL | Status: AC
  Administered 2023-06-06 (×2): 40 meq via ORAL
  Filled 2023-06-06 (×2): qty 2

## 2023-06-06 MED ORDER — POTASSIUM CHLORIDE 20 MEQ PO PACK: 40.0000 meq | PACK | ORAL | Status: DC

## 2023-06-06 MED ORDER — ACETAMINOPHEN 325 MG PO TABS
650.0000 mg | ORAL_TABLET | ORAL | Status: DC | PRN
  Administered 2023-06-06 – 2023-06-08 (×4): 650 mg via ORAL
  Filled 2023-06-06 (×4): qty 2

## 2023-06-06 MED ORDER — KETOROLAC TROMETHAMINE 15 MG/ML IJ SOLN
15.0000 mg | Freq: Three times a day (TID) | INTRAMUSCULAR | Status: DC | PRN
  Administered 2023-06-06 – 2023-06-08 (×5): 15 mg via INTRAVENOUS
  Filled 2023-06-06 (×5): qty 1

## 2023-06-06 MED ORDER — METHOCARBAMOL 1000 MG/10ML IJ SOLN
500.0000 mg | Freq: Three times a day (TID) | INTRAVENOUS | Status: DC | PRN
  Administered 2023-06-06 – 2023-06-08 (×4): 500 mg via INTRAVENOUS
  Filled 2023-06-06 (×3): qty 500
  Filled 2023-06-06: qty 5

## 2023-06-06 MED ORDER — POTASSIUM CHLORIDE 2 MEQ/ML IV SOLN
INTRAVENOUS | Status: DC
  Filled 2023-06-06 (×3): qty 1000

## 2023-06-06 MED ORDER — POTASSIUM CHLORIDE CRYS ER 20 MEQ PO TBCR
40.0000 meq | EXTENDED_RELEASE_TABLET | Freq: Two times a day (BID) | ORAL | Status: DC
  Filled 2023-06-06: qty 2

## 2023-06-06 MED ORDER — KCL-LACTATED RINGERS 20 MEQ/L IV SOLN
INTRAVENOUS | Status: DC
  Filled 2023-06-06: qty 1000

## 2023-06-06 NOTE — TOC Initial Note (Signed)
Transition of Care [facility]) - Initial/Assessment Note    Patient Details  Name: Gabrielle Nguyen MRN: [id number] Date of Birth: 1996/08/01  Transition of Care [facility]) CM/SW Contact:    Howell Rucks, RN Phone Number: 06/06/2023, 12:57 PM  Clinical Narrative: Met with pt at bedside to introduce role of TOC/NCM and review for dc planning. Pt reports she has an established PCP and pharmacy, no current home care services or home DME, pt reports she feels safe returning home with support from her spouse, reports no housing insecurity, confirmed she has transportation available at discharge.TOC consult for Substance Abuse Resources, pt agreeable, added to AVS. TOC will continue to follow.                    Expected Discharge Plan: Home/Self Care Barriers to Discharge: Continued Medical Work up   Patient Goals and CMS Choice Patient states their goals for this hospitalization and ongoing recovery are:: return home with spouse          Expected Discharge Plan and Services       Living arrangements for the past 2 months: Apartment                                      Prior Living Arrangements/Services Living arrangements for the past 2 months: Apartment Lives with:: Spouse Patient language and need for interpreter reviewed:: Yes Do you feel safe going back to the place where you live?: Yes      Need for Family Participation in Patient Care: Yes (Comment) Care giver support system in place?: Yes (comment)   Criminal Activity/Legal Involvement Pertinent to Current Situation/Hospitalization: No - Comment as needed  Activities of Daily Living   ADL Screening (condition at time of admission) Independently performs ADLs?: Yes (appropriate for developmental age) Is the patient deaf or have difficulty hearing?: No Does the patient have difficulty seeing, even when wearing glasses/contacts?: No Does the patient have difficulty concentrating, remembering, or making decisions?:  No  Permission Sought/Granted Permission sought to share information with : Case Manager Permission granted to share information with : Yes, Verbal Permission Granted  Share Information with NAME: Fannie Knee, RN           Emotional Assessment Appearance:: Appears stated age Attitude/Demeanor/Rapport: Gracious Affect (typically observed): Accepting Orientation: : Oriented to Self, Oriented to Place, Oriented to  Time, Oriented to Situation Alcohol / Substance Use: Illicit Drugs Psych Involvement: No (comment)  Admission diagnosis:  Hypokalemia [E87.6] Substance withdrawal (HCC) [F19.939] Patient Active Problem List   Diagnosis Date Noted   Withdrawal from recreational drug (HCC) 06/04/2023   Hypokalemia 06/04/2023   Substance withdrawal (HCC) 06/04/2023   UTI (urinary tract infection) 06/04/2023   Polysubstance abuse (HCC) 06/03/2023   Substance induced mood disorder (HCC) 06/03/2023   Withdrawal from benzodiazepine (HCC) 06/03/2023   Pregnancy 07/25/2021   Anemia in pregnancy 06/26/2021   Generalized anxiety disorder 05/16/2021   Cervical insufficiency during pregnancy in second trimester, antepartum 04/27/2021   Red Chart Rounds Patient 04/20/2021   Prolonged Q-T interval on ECG 02/06/2021   Elevated troponin 02/06/2021   Acute systolic CHF (congestive heart failure) (HCC)    Takotsubo cardiomyopathy    Hyperemesis affecting pregnancy, antepartum 02/02/2021   Opioid use disorder 12/30/2020   Asthma    Anxiety with depression    Normocytic anemia    PTSD (post-traumatic stress disorder) 01/07/2018   MDD (  major depressive disorder) 08/19/2011   Panic disorder 08/19/2011   PCP:  Pcp, No Pharmacy:   [id number] - [place], [place] - [address] [facility] [address] [place] [address] Phone: [phone] Fax: [phone]     Social Determinants of Health (SDOH) Social History: SDOH Screenings   Food  Insecurity: Patient Declined (06/05/2023)  Housing: High Risk (06/05/2023)  Transportation Needs: Patient Declined (06/05/2023)  Utilities: Patient Declined (06/05/2023)  Depression (PHQ2-9): High Risk (06/03/2023)  Financial Resource Strain: Medium Risk (02/07/2021)  Tobacco Use: Low Risk  (06/04/2023)   SDOH Interventions:     Readmission Risk Interventions    06/06/2023   12:55 PM 02/08/2021   12:36 PM  Readmission Risk Prevention Plan  Transportation Screening Complete Complete  PCP or Specialist Appt within 3-5 Days Complete   HRI or Home Care Consult Complete   Social Work Consult for Recovery Care Planning/Counseling Complete   Palliative Care Screening Not Applicable   Medication Review Oceanographer) Complete   PCP or Specialist appointment within 3-5 days of discharge  Complete  HRI or Home Care Consult  Complete  SW Recovery Care/Counseling Consult  Complete  Palliative Care Screening  Not Applicable  Skilled Nursing Facility  Not Applicable

## 2023-06-06 NOTE — Progress Notes (Signed)
Patient denies housing insecurity, reports she resides wit her spouse in an apartment, feels safe returning home.

## 2023-06-06 NOTE — Plan of Care (Signed)
  Problem: Education: Goal: Knowledge of General Education information will improve Description: Including pain rating scale, medication(s)/side effects and non-pharmacologic comfort measures Outcome: Progressing   Problem: Clinical Measurements: Goal: Will remain free from infection Outcome: Progressing   Problem: Safety: Goal: Ability to remain free from injury will improve Outcome: Progressing   

## 2023-06-06 NOTE — Plan of Care (Signed)
  Problem: Education: Goal: Knowledge of General Education information will improve Description: Including pain rating scale, medication(s)/side effects and non-pharmacologic comfort measures Outcome: Progressing   Problem: Health Behavior/Discharge Planning: Goal: Ability to manage health-related needs will improve Outcome: Progressing   Problem: Clinical Measurements: Goal: Ability to maintain clinical measurements within normal limits will improve Outcome: Progressing Goal: Diagnostic test results will improve Outcome: Progressing   Problem: Nutrition: Goal: Adequate nutrition will be maintained Outcome: Progressing   Problem: Coping: Goal: Level of anxiety will decrease Outcome: Progressing   Problem: Clinical Measurements: Goal: Will remain free from infection Outcome: Adequate for Discharge Goal: Cardiovascular complication will be avoided Outcome: Adequate for Discharge   Problem: Activity: Goal: Risk for activity intolerance will decrease Outcome: Adequate for Discharge   Problem: Elimination: Goal: Will not experience complications related to bowel motility Outcome: Adequate for Discharge Goal: Will not experience complications related to urinary retention Outcome: Adequate for Discharge   Problem: Pain Managment: Goal: General experience of comfort will improve Outcome: Adequate for Discharge   Problem: Safety: Goal: Ability to remain free from injury will improve Outcome: Adequate for Discharge   Problem: Skin Integrity: Goal: Risk for impaired skin integrity will decrease Outcome: Adequate for Discharge

## 2023-06-06 NOTE — Progress Notes (Signed)
PROGRESS NOTE    Gabrielle Nguyen  NGE:[id number] DOB: 23-Apr-1996 DOA: 06/04/2023 PCP: Pcp, No     Brief Narrative:  Gabrielle Nguyen is a 27 y.o. female with medical history significant of polysubstance abuse, takotsubo cardiomyopathy, PTSD, depression and anxiety who presents with hypokalemia, nausea, vomiting and diarrhea.   Pt initially [facility] on 06/02/2023 complaining of withdrawal from unknown substance and was sent to ED for medical clearance. In the ED, UDS was positive for amphetamines, Benzos, cocaine, and THC. She was found to have hypokalemia of 3.1 and elevated anion gap but otherwise deemed stable and discharged. She then re-presented to [facility] on 06/03/2023 and was not able to provide meaningful history. Boyfriend reports patient previously prescribed Klonopin at least a year ago and started to take Xanax off the street in addition to other substances. She was again noted to have hypokalemia and treated with oral supplementation and she was placed on COWS protocol. Also started on Macrobid for UTI.   New events last 24 hours / Subjective: Feels achy, but overall improved.  Able to keep down food.  Asking for Klonopin, states that she has been taking this medication since she was 27 years old.  However, upon further review of her PDMP, she was last prescribed Klonopin in August 2023.  Appears she may be taking benzos off the street.  Assessment & Plan:  Principal Problem:   Substance withdrawal (HCC) Active Problems:   Polysubstance abuse (HCC)   Hypokalemia   UTI (urinary tract infection)   Substance withdrawal -UDS in ED 9/30 positive for amphetamines, Benzos, cocaine, and THC -Supportive care, CIWA   UTI ruled out -UA unremarkable  Hypokalemia -Replace   DVT prophylaxis:  enoxaparin (LOVENOX) injection 40 mg Start: 06/05/23 1000  Code Status: Full Family Communication: None at bedside Disposition Plan: Home Status is: Inpatient Remains  inpatient appropriate because: Replace potassium.  Hopeful discharge 10/5      Antimicrobials:  Anti-infectives (From admission, onward)    Start     Dose/Rate Route Frequency Ordered Stop   06/04/23 2300  cefTRIAXone (ROCEPHIN) 1 g in sodium chloride 0.9 % 100 mL IVPB  Status:  Discontinued        1 g 200 mL/hr over 30 Minutes Intravenous Every 24 hours 06/04/23 2242 06/05/23 0751        Objective: Vitals:   06/05/23 2100 06/05/23 2342 06/06/23 0419 06/06/23 1215  BP: 127/80 116/72 108/77 108/72  Pulse: 94 83 62 (!) 106  Resp:  20 (!) 24 18  Temp: 98.3 F (36.8 C) 98.1 F (36.7 C) 97.6 F (36.4 C) 98.7 F (37.1 C)  TempSrc: Oral Oral Oral Oral  SpO2: 98% 98% 98% 99%  Weight:      Height:        Intake/Output Summary (Last 24 hours) at 06/06/2023 1352 Last data filed at 06/06/2023 1300 Gross per 24 hour  Intake 1196.88 ml  Output --  Net 1196.88 ml   Filed Weights   06/04/23 1732  Weight: 53.8 kg    Examination:  General exam: Appears calm and comfortable  Respiratory system: Clear to auscultation. Respiratory effort normal. No respiratory distress. No conversational dyspnea.  Cardiovascular system: S1 & S2 heard, RRR. No murmurs. No pedal edema. Gastrointestinal system: Abdomen is nondistended, soft  Central nervous system: Alert  Extremities: Symmetric in appearance  Skin: No rashes, lesions or ulcers on exposed skin   Data Reviewed: I have personally reviewed following labs and imaging studies  CBC: Recent Labs  Lab 06/02/23 2028 06/03/23 1837 06/04/23 1110 06/04/23 1845 06/05/23 0545  WBC 10.9* 6.7 5.0 4.9 4.6  NEUTROABS 9.2* 4.4 3.0 2.4  --   HGB 12.4 11.6* 10.5* 11.1* 10.2*  HCT 39.8 37.1 33.8* 35.9* 32.7*  MCV 78.0* 78.3* 80.5 80.9 80.5  PLT 307 248 228 238 198   Basic Metabolic Panel: Recent Labs  Lab 06/03/23 1837 06/04/23 1110 06/04/23 1845 06/05/23 0545 06/06/23 0458  NA 139 138 136 134* 137  K 3.1* 3.2* 2.9* 2.9* 2.7*  CL  106 104 105 103 104  CO2 19* 20* 21* 20* 22  GLUCOSE 92 103* 82 98 105*  BUN 13 13 12 11 9   CREATININE 0.63 0.64 0.46 0.52 0.47  CALCIUM 9.4 9.2 9.1 8.2* 8.6*  MG 2.5*  --  2.4  --  2.1   GFR: Estimated Creatinine Clearance: 88.9 mL/min (by C-G formula based on SCr of 0.47 mg/dL). Liver Function Tests: Recent Labs  Lab 06/02/23 2028 06/03/23 1837 06/04/23 1845  AST 16 19 16   ALT 14 13 11   ALKPHOS 41 41 34*  BILITOT 0.6 0.7 0.7  PROT 8.2* 7.3 7.2  ALBUMIN 4.6 4.2 4.3   No results for input(s): "LIPASE", "AMYLASE" in the last 168 hours. No results for input(s): "AMMONIA" in the last 168 hours. Coagulation Profile: No results for input(s): "INR", "PROTIME" in the last 168 hours. Cardiac Enzymes: No results for input(s): "CKTOTAL", "CKMB", "CKMBINDEX", "TROPONINI" in the last 168 hours. BNP (last 3 results) No results for input(s): "PROBNP" in the last 8760 hours. HbA1C: No results for input(s): "HGBA1C" in the last 72 hours. CBG: Recent Labs  Lab 06/02/23 2043  GLUCAP 93   Lipid Profile: No results for input(s): "CHOL", "HDL", "LDLCALC", "TRIG", "CHOLHDL", "LDLDIRECT" in the last 72 hours. Thyroid Function Tests: No results for input(s): "TSH", "T4TOTAL", "FREET4", "T3FREE", "THYROIDAB" in the last 72 hours. Anemia Panel: Recent Labs    06/04/23 1110  IRON 30   Sepsis Labs: No results for input(s): "PROCALCITON", "LATICACIDVEN" in the last 168 hours.  No results found for this or any previous visit (from the past 240 hour(s)).    Radiology Studies: No results found.    Scheduled Meds:  enoxaparin (LOVENOX) injection  40 mg Subcutaneous Q24H   folic acid  1 mg Oral Daily   influenza vac split trivalent PF  0.5 mL Intramuscular Tomorrow-1000   thiamine  100 mg Oral Daily   Or   thiamine  100 mg Intravenous Daily   Continuous Infusions:  lactated ringers 1,000 mL with potassium chloride 20 mEq infusion 100 mL/hr at 06/06/23 0814     LOS: 0 days    Time spent: 25 minutes   Noralee Stain, DO [facility] 06/06/2023, 1:52 PM   Available via Epic secure chat 7am-7pm After these hours, please refer to coverage provider listed on [email or web address]

## 2023-06-06 NOTE — Plan of Care (Signed)
  Problem: Clinical Measurements: Goal: Diagnostic test results will improve Outcome: Progressing   Problem: Coping: Goal: Level of anxiety will decrease Outcome: Progressing   Problem: Safety: Goal: Ability to remain free from injury will improve Outcome: Progressing   

## 2023-06-07 DIAGNOSIS — F19939 Other psychoactive substance use, unspecified with withdrawal, unspecified: Secondary | ICD-10-CM | POA: Diagnosis not present

## 2023-06-07 LAB — BASIC METABOLIC PANEL
Anion gap: 13 (ref 5–15)
BUN: 9 mg/dL (ref 6–20)
CO2: 20 mmol/L — ABNORMAL LOW (ref 22–32)
Calcium: 8.9 mg/dL (ref 8.9–10.3)
Chloride: 106 mmol/L (ref 98–111)
Creatinine, Ser: 0.45 mg/dL (ref 0.44–1.00)
GFR, Estimated: >60 mL/min (ref >60)
Glucose, Bld: 89 mg/dL (ref 70–99)
Potassium: 3.2 mmol/L — ABNORMAL LOW (ref 3.5–5.1)
Sodium: 139 mmol/L (ref 135–145)

## 2023-06-07 MED ORDER — POTASSIUM CHLORIDE 20 MEQ PO PACK
40.0000 meq | PACK | ORAL | Status: AC
  Administered 2023-06-07 (×2): 40 meq via ORAL
  Filled 2023-06-07 (×2): qty 2

## 2023-06-07 MED ORDER — LORAZEPAM 2 MG/ML IJ SOLN
1.0000 mg | Freq: Once | INTRAMUSCULAR | Status: AC
  Administered 2023-06-07: 1 mg via INTRAVENOUS
  Filled 2023-06-07: qty 1

## 2023-06-07 MED ORDER — ENSURE ENLIVE PO LIQD: 237.0000 mL | Freq: Two times a day (BID) | ORAL | Status: DC

## 2023-06-07 MED ORDER — LORAZEPAM 1 MG PO TABS: 1.0000 mg | ORAL_TABLET | Freq: Once | ORAL | Status: DC

## 2023-06-07 NOTE — Progress Notes (Signed)
   06/07/23 1036  Assess: if the MEWS score is Yellow or Red  Were vital signs accurate and taken at a resting state? Yes  Does the patient meet 2 or more of the SIRS criteria? No  MEWS guidelines implemented  Yes, yellow  Treat  MEWS Interventions Considered administering scheduled or prn medications/treatments as ordered  Take Vital Signs  Increase Vital Sign Frequency  Yellow: Q2hr x1, continue Q4hrs until patient remains green for 12hrs  Escalate  MEWS: Escalate Yellow: Discuss with charge nurse and consider notifying provider and/or RRT  Notify: Charge Nurse/RN  Name of Charge Nurse/RN Notified Melton Alar RN

## 2023-06-07 NOTE — Progress Notes (Signed)
   06/07/23 1026  Assess: MEWS Score  Temp 98 F (36.7 C)  BP 95/63  MAP (mmHg) 73  Pulse Rate (!) 119  Resp 18  SpO2 98 %  Assess: MEWS Score  MEWS Temp 0  MEWS Systolic 1  MEWS Pulse 2  MEWS RR 0  MEWS LOC 0  MEWS Score 3  MEWS Score Color Yellow  Assess: SIRS CRITERIA  SIRS Temperature  0  SIRS Pulse 1  SIRS Respirations  0  SIRS WBC 0  SIRS Score Sum  1

## 2023-06-07 NOTE — Progress Notes (Signed)
PROGRESS NOTE    Gabrielle Nguyen  WJX:[id number] DOB: [birth year] DOA: 06/04/2023 PCP: Pcp, No     Brief Narrative:  Gabrielle Nguyen is a 27 y.o. female with medical history significant of polysubstance abuse, takotsubo cardiomyopathy, PTSD, depression and anxiety who presents with hypokalemia, nausea, vomiting and diarrhea.   [facility] on 06/02/2023 complaining of withdrawal from unknown substance and was sent to ED for medical clearance. In the ED, UDS was positive for amphetamines, Benzos, cocaine, and THC. She was found to have hypokalemia of 3.1 and elevated anion gap but otherwise deemed stable and discharged. She then re-presented to [facility] on 06/03/2023 and was not able to provide meaningful history. Boyfriend reports patient previously prescribed Klonopin at least a year ago and started to take Xanax off the street in addition to other substances. She was again noted to have hypokalemia and treated with oral supplementation and she was placed on COWS protocol. Also started on Macrobid for UTI.   New events last 24 hours / Subjective: Feeling muscle "pulls".  States that she has messaged her outpatient psych office for close follow-up on Monday.  Does not feel she can discharge home today.  Still not feeling well.  Remains tachycardic.  Assessment & Plan:  Principal Problem:   Substance withdrawal (HCC) Active Problems:   Polysubstance abuse (HCC)   Hypokalemia   UTI (urinary tract infection)   Substance withdrawal -UDS in ED 9/30 positive for amphetamines, Benzos, cocaine, and THC -Supportive care, CIWA   UTI ruled out -UA unremarkable  Hypokalemia -Replace   DVT prophylaxis:  enoxaparin (LOVENOX) injection 40 mg Start: 06/05/23 1000  Code Status: Full Family Communication: Significant other at bedside Disposition Plan: Home Status is: Inpatient Remains inpatient appropriate because: Replace potassium.  Hopeful discharge  10/6      Antimicrobials:  Anti-infectives (From admission, onward)    Start     Dose/Rate Route Frequency Ordered Stop   06/04/23 2300  cefTRIAXone (ROCEPHIN) 1 g in sodium chloride 0.9 % 100 mL IVPB  Status:  Discontinued        1 g 200 mL/hr over 30 Minutes Intravenous Every 24 hours 06/04/23 2242 06/05/23 0751        Objective: Vitals:   06/07/23 0443 06/07/23 0800 06/07/23 1026 06/07/23 1136  BP: 111/74 115/76 95/63 (!) 118/95  Pulse: 98 97 (!) 119 (!) 119  Resp: 18 16 18 20   Temp: 97.7 F (36.5 C) 98.1 F (36.7 C) 98 F (36.7 C) 97.8 F (36.6 C)  TempSrc: Oral Oral Oral Oral  SpO2: 98% 99% 98% 98%  Weight:      Height:        Intake/Output Summary (Last 24 hours) at 06/07/2023 1245 Last data filed at 06/07/2023 0832 Gross per 24 hour  Intake 2968.33 ml  Output --  Net 2968.33 ml   Filed Weights   06/04/23 1732  Weight: 53.8 kg    Examination:  General exam: Appears calm and comfortable  Respiratory system: Clear to auscultation. Respiratory effort normal. No respiratory distress. No conversational dyspnea.  Cardiovascular system: S1 & S2 heard, tachycardic, irregular rhythm. No murmurs. No pedal edema. Gastrointestinal system: Abdomen is nondistended Central nervous system: Alert  Extremities: Symmetric in appearance  Skin: No rashes, lesions or ulcers on exposed skin   Data Reviewed: I have personally reviewed following labs and imaging studies  CBC: Recent Labs  Lab 06/02/23 2028 06/03/23 1837 06/04/23 1110 06/04/23 1845 06/05/23 0545  WBC  10.9* 6.7 5.0 4.9 4.6  NEUTROABS 9.2* 4.4 3.0 2.4  --   HGB 12.4 11.6* 10.5* 11.1* 10.2*  HCT 39.8 37.1 33.8* 35.9* 32.7*  MCV 78.0* 78.3* 80.5 80.9 80.5  PLT 307 248 228 238 198   Basic Metabolic Panel: Recent Labs  Lab 06/03/23 1837 06/04/23 1110 06/04/23 1845 06/05/23 0545 06/06/23 0458 06/07/23 0517  NA 139 138 136 134* 137 139  K 3.1* 3.2* 2.9* 2.9* 2.7* 3.2*  CL 106 104 105 103 104 106   CO2 19* 20* 21* 20* 22 20*  GLUCOSE 92 103* 82 98 105* 89  BUN 13 13 12 11 9 9   CREATININE 0.63 0.64 0.46 0.52 0.47 0.45  CALCIUM 9.4 9.2 9.1 8.2* 8.6* 8.9  MG 2.5*  --  2.4  --  2.1  --    GFR: Estimated Creatinine Clearance: 88.9 mL/min (by C-G formula based on SCr of 0.45 mg/dL). Liver Function Tests: Recent Labs  Lab 06/02/23 2028 06/03/23 1837 06/04/23 1845  AST 16 19 16   ALT 14 13 11   ALKPHOS 41 41 34*  BILITOT 0.6 0.7 0.7  PROT 8.2* 7.3 7.2  ALBUMIN 4.6 4.2 4.3   No results for input(s): "LIPASE", "AMYLASE" in the last 168 hours. No results for input(s): "AMMONIA" in the last 168 hours. Coagulation Profile: No results for input(s): "INR", "PROTIME" in the last 168 hours. Cardiac Enzymes: No results for input(s): "CKTOTAL", "CKMB", "CKMBINDEX", "TROPONINI" in the last 168 hours. BNP (last 3 results) No results for input(s): "PROBNP" in the last 8760 hours. HbA1C: No results for input(s): "HGBA1C" in the last 72 hours. CBG: Recent Labs  Lab 06/02/23 2043  GLUCAP 93   Lipid Profile: No results for input(s): "CHOL", "HDL", "LDLCALC", "TRIG", "CHOLHDL", "LDLDIRECT" in the last 72 hours. Thyroid Function Tests: No results for input(s): "TSH", "T4TOTAL", "FREET4", "T3FREE", "THYROIDAB" in the last 72 hours. Anemia Panel: No results for input(s): "VITAMINB12", "FOLATE", "FERRITIN", "TIBC", "IRON", "RETICCTPCT" in the last 72 hours.  Sepsis Labs: No results for input(s): "PROCALCITON", "LATICACIDVEN" in the last 168 hours.  No results found for this or any previous visit (from the past 240 hour(s)).    Radiology Studies: No results found.    Scheduled Meds:  enoxaparin (LOVENOX) injection  40 mg Subcutaneous Q24H   feeding supplement  237 mL Oral BID BM   folic acid  1 mg Oral Daily   influenza vac split trivalent PF  0.5 mL Intramuscular Tomorrow-1000   potassium chloride  40 mEq Oral Q4H   thiamine  100 mg Oral Daily   Or   thiamine  100 mg  Intravenous Daily   Continuous Infusions:  methocarbamol (ROBAXIN) IV 500 mg (06/07/23 0657)     LOS: 1 day   Time spent: 25 minutes   Noralee Stain, DO [facility] 06/07/2023, 12:45 PM   Available via Epic secure chat 7am-7pm After these hours, please refer to coverage provider listed on [email or web address]

## 2023-06-08 DIAGNOSIS — F19939 Other psychoactive substance use, unspecified with withdrawal, unspecified: Secondary | ICD-10-CM | POA: Diagnosis not present

## 2023-06-08 LAB — BASIC METABOLIC PANEL
Anion gap: 9 (ref 5–15)
BUN: 14 mg/dL (ref 6–20)
CO2: 22 mmol/L (ref 22–32)
Calcium: 8.6 mg/dL — ABNORMAL LOW (ref 8.9–10.3)
Chloride: 106 mmol/L (ref 98–111)
Creatinine, Ser: 0.6 mg/dL (ref 0.44–1.00)
GFR, Estimated: >60 mL/min (ref >60)
Glucose, Bld: 96 mg/dL (ref 70–99)
Potassium: 3 mmol/L — ABNORMAL LOW (ref 3.5–5.1)
Sodium: 137 mmol/L (ref 135–145)

## 2023-06-08 LAB — MAGNESIUM: Magnesium: 2.2 mg/dL (ref 1.7–2.4)

## 2023-06-08 MED ORDER — LORAZEPAM 2 MG/ML IJ SOLN
1.0000 mg | Freq: Once | INTRAMUSCULAR | Status: AC
  Administered 2023-06-08: 1 mg via INTRAVENOUS
  Filled 2023-06-08: qty 1

## 2023-06-08 MED ORDER — HYDROXYZINE HCL 25 MG PO TABS
25.0000 mg | ORAL_TABLET | Freq: Three times a day (TID) | ORAL | Status: DC | PRN
  Administered 2023-06-08: 25 mg via ORAL
  Filled 2023-06-08: qty 1

## 2023-06-08 MED ORDER — ALBUTEROL SULFATE (2.5 MG/3ML) 0.083% IN NEBU
3.0000 mL | INHALATION_SOLUTION | RESPIRATORY_TRACT | Status: DC | PRN
  Administered 2023-06-08: 3 mL via RESPIRATORY_TRACT
  Filled 2023-06-08 (×2): qty 3

## 2023-06-08 MED ORDER — ORAL CARE MOUTH RINSE: 15.0000 mL | OROMUCOSAL | Status: DC | PRN

## 2023-06-08 MED ORDER — POTASSIUM CHLORIDE 20 MEQ PO PACK
40.0000 meq | PACK | ORAL | Status: DC
  Administered 2023-06-08: 40 meq via ORAL
  Filled 2023-06-08: qty 2

## 2023-06-08 NOTE — Plan of Care (Signed)
  Problem: Education: Goal: Knowledge of General Education information will improve Description: Including pain rating scale, medication(s)/side effects and non-pharmacologic comfort measures Outcome: Completed/Met   Problem: Activity: Goal: Risk for activity intolerance will decrease Outcome: Completed/Met   Problem: Pain Managment: Goal: General experience of comfort will improve 06/08/2023 1143 by Audie Pinto, RN Outcome: Adequate for Discharge 06/08/2023 0953 by Audie Pinto, RN Outcome: Progressing  Reviewed discharge instructions with pt including medications and follow up appointments. Pt verbalized understanding.

## 2023-06-08 NOTE — Plan of Care (Signed)
  Problem: Pain Managment: Goal: General experience of comfort will improve Outcome: Progressing   

## 2023-06-08 NOTE — Discharge Summary (Signed)
Physician Discharge Summary  [facility] ZOX:[id number] DOB: 05-14-1996 DOA: 06/04/2023  PCP: Pcp, No  Admit date: 06/04/2023 Discharge date: 06/08/2023  Admitted From: Home Disposition: Home  Recommendations for Outpatient Follow-up:  Follow up with PCP and psychiatry  Discharge Condition: Stable CODE STATUS: Full code Diet recommendation: Regular diet  Brief/Interim Summary:  Gabrielle Nguyen is a 27 y.o. female with medical history significant of polysubstance abuse, takotsubo cardiomyopathy, PTSD, depression and anxiety who presents with hypokalemia, nausea, vomiting and diarrhea.   Pt [facility] on 06/02/2023 complaining of withdrawal from unknown substance and was sent to ED for medical clearance. In the ED, UDS was positive for amphetamines, Benzos, cocaine, and THC. She was found to have hypokalemia of 3.1 and elevated anion gap but otherwise deemed stable and discharged. She then re-presented to [facility] on 06/03/2023 and was not able to provide meaningful history. Boyfriend reports patient previously prescribed Klonopin at least a year ago and started to take Xanax off the street in addition to other substances. She was again noted to have hypokalemia and treated with oral supplementation and she was placed on COWS protocol.   Hospitalization further complicated by hypokalemia as well as Ativan dependence.  Ativan was weaned off.  Discussed with her that if she is wanting Klonopin, she needs to get it from her outpatient psych or primary care provider.  On review of PDMP, patient had not had a prescription for Klonopin since August 2023.  I offered inpatient psychiatry referral, but prior to seeing psychiatry, patient wanted to discharge home.  Discharge Diagnoses:   Principal Problem:   Substance withdrawal (HCC) Active Problems:   Polysubstance abuse (HCC)   Hypokalemia     Discharge Instructions  Discharge Instructions     Call MD for:   difficulty breathing, headache or visual disturbances   Complete by: As directed    Call MD for:  extreme fatigue   Complete by: As directed    Call MD for:  persistant dizziness or light-headedness   Complete by: As directed    Call MD for:  persistant nausea and vomiting   Complete by: As directed    Call MD for:  severe uncontrolled pain   Complete by: As directed    Call MD for:  temperature >100.4   Complete by: As directed    Discharge instructions   Complete by: As directed    You were cared for by a hospitalist during your hospital stay. If you have any questions about your discharge medications or the care you received while you were in the hospital after you are discharged, you can call the unit and ask to speak with the hospitalist on call if the hospitalist that took care of you is not available. Once you are discharged, your primary care physician will handle any further medical issues. Please note that NO REFILLS for any discharge medications will be authorized once you are discharged, as it is imperative that you return to your primary care physician (or establish a relationship with a primary care physician if you do not have one) for your aftercare needs so that they can reassess your need for medications and monitor your lab values.   Increase activity slowly   Complete by: As directed       Allergies as of 06/08/2023   No Known Allergies      Medication List     TAKE these medications    albuterol 108 (90 Base) MCG/ACT inhaler  Commonly known as: VENTOLIN HFA Inhale 2 puffs into the lungs every 6 (six) hours as needed for wheezing or shortness of breath.        Follow-up Information     Psychiatry Follow up.                 No Known Allergies    Procedures/Studies: DG Chest 1 View  Result Date: 06/02/2023 CLINICAL DATA:  Tachypnea. EXAM: CHEST  1 VIEW COMPARISON:  10/04/2020 FINDINGS: The cardiomediastinal contours are normal. The lungs are  clear. Pulmonary vasculature is normal. No consolidation, pleural effusion, or pneumothorax. No acute osseous abnormalities are seen. Overlying jewelry artifacts at the apices. Bilateral nipple shadows. IMPRESSION: No acute chest findings. Electronically Signed   By: Narda Rutherford M.D.   On: 06/02/2023 21:09      Discharge Exam: Vitals:   06/08/23 0343 06/08/23 0909  BP: 123/65 110/65  Pulse: (!) 105 (!) 102  Resp: 20 20  Temp: 97.7 F (36.5 C) 98 F (36.7 C)  SpO2: 99% 98%    General: Pt is alert, awake, not in acute distress Cardiovascular: Tachycardic, regular rhythm, S1/S2 +, no edema Respiratory: CTA bilaterally, no wheezing, no rhonchi, no respiratory distress, no conversational dyspnea  Abdominal: Soft, NT, ND, bowel sounds + Extremities: no edema, no cyanosis Psych: Normal mood and affect, poor judgement and insight     The results of significant diagnostics from this hospitalization (including imaging, microbiology, ancillary and laboratory) are listed below for reference.     Microbiology: No results found for this or any previous visit (from the past 240 hour(s)).   Labs: BNP (last 3 results) No results for input(s): "BNP" in the last 8760 hours. Basic Metabolic Panel: Recent Labs  Lab 06/03/23 1837 06/04/23 1110 06/04/23 1845 06/05/23 0545 06/06/23 0458 06/07/23 0517 06/08/23 0516  NA 139   < > 136 134* 137 139 137  K 3.1*   < > 2.9* 2.9* 2.7* 3.2* 3.0*  CL 106   < > 105 103 104 106 106  CO2 19*   < > 21* 20* 22 20* 22  GLUCOSE 92   < > 82 98 105* 89 96  BUN 13   < > 12 11 9 9 14   CREATININE 0.63   < > 0.46 0.52 0.47 0.45 0.60  CALCIUM 9.4   < > 9.1 8.2* 8.6* 8.9 8.6*  MG 2.5*  --  2.4  --  2.1  --  2.2   < > = values in this interval not displayed.   Liver Function Tests: Recent Labs  Lab 06/02/23 2028 06/03/23 1837 06/04/23 1845  AST 16 19 16   ALT 14 13 11   ALKPHOS 41 41 34*  BILITOT 0.6 0.7 0.7  PROT 8.2* 7.3 7.2  ALBUMIN 4.6 4.2 4.3    No results for input(s): "LIPASE", "AMYLASE" in the last 168 hours. No results for input(s): "AMMONIA" in the last 168 hours. CBC: Recent Labs  Lab 06/02/23 2028 06/03/23 1837 06/04/23 1110 06/04/23 1845 06/05/23 0545  WBC 10.9* 6.7 5.0 4.9 4.6  NEUTROABS 9.2* 4.4 3.0 2.4  --   HGB 12.4 11.6* 10.5* 11.1* 10.2*  HCT 39.8 37.1 33.8* 35.9* 32.7*  MCV 78.0* 78.3* 80.5 80.9 80.5  PLT 307 248 228 238 198   Cardiac Enzymes: No results for input(s): "CKTOTAL", "CKMB", "CKMBINDEX", "TROPONINI" in the last 168 hours. BNP: Invalid input(s): "POCBNP" CBG: Recent Labs  Lab 06/02/23 2043  GLUCAP 93   D-Dimer No results  for input(s): "DDIMER" in the last 72 hours. Hgb A1c No results for input(s): "HGBA1C" in the last 72 hours. Lipid Profile No results for input(s): "CHOL", "HDL", "LDLCALC", "TRIG", "CHOLHDL", "LDLDIRECT" in the last 72 hours. Thyroid function studies No results for input(s): "TSH", "T4TOTAL", "T3FREE", "THYROIDAB" in the last 72 hours.  Invalid input(s): "FREET3" Anemia work up No results for input(s): "VITAMINB12", "FOLATE", "FERRITIN", "TIBC", "IRON", "RETICCTPCT" in the last 72 hours. Urinalysis    Component Value Date/Time   COLORURINE YELLOW 06/04/2023 2313   APPEARANCEUR HAZY (A) 06/04/2023 2313   LABSPEC 1.017 06/04/2023 2313   PHURINE 6.0 06/04/2023 2313   GLUCOSEU NEGATIVE 06/04/2023 2313   HGBUR LARGE (A) 06/04/2023 2313   BILIRUBINUR NEGATIVE 06/04/2023 2313   KETONESUR 20 (A) 06/04/2023 2313   PROTEINUR NEGATIVE 06/04/2023 2313   UROBILINOGEN 0.2 07/11/2015 2112   NITRITE NEGATIVE 06/04/2023 2313   LEUKOCYTESUR TRACE (A) 06/04/2023 2313   Sepsis Labs Recent Labs  Lab 06/03/23 1837 06/04/23 1110 06/04/23 1845 06/05/23 0545  WBC 6.7 5.0 4.9 4.6   Microbiology No results found for this or any previous visit (from the past 240 hour(s)).   Patient was seen and examined on the day of discharge and was found to be in stable condition.  Time coordinating discharge: 25 minutes including assessment and coordination of care, as well as examination of the patient.   SIGNED:  Noralee Stain, DO [facility] 06/08/2023, 12:24 PM

## 2023-08-22 ENCOUNTER — Emergency Department (HOSPITAL_COMMUNITY)
Admission: EM | Admit: 2023-08-22 | Discharge: 2023-08-22 | Disposition: A | Discharge status: Home / Self Care | Payer: Self-pay | Attending: Emergency Medicine | Admitting: Emergency Medicine

## 2023-08-22 ENCOUNTER — Emergency Department (HOSPITAL_COMMUNITY): Payer: Self-pay

## 2023-08-22 ENCOUNTER — Encounter (HOSPITAL_COMMUNITY): Payer: Self-pay

## 2023-08-22 ENCOUNTER — Other Ambulatory Visit: Payer: Self-pay

## 2023-08-22 DIAGNOSIS — Z3491 Encounter for supervision of normal pregnancy, unspecified, first trimester: Secondary | ICD-10-CM | POA: Insufficient documentation

## 2023-08-22 DIAGNOSIS — Z3490 Encounter for supervision of normal pregnancy, unspecified, unspecified trimester: Secondary | ICD-10-CM

## 2023-08-22 DIAGNOSIS — F13930 Sedative, hypnotic or anxiolytic use, unspecified with withdrawal, uncomplicated: Secondary | ICD-10-CM | POA: Insufficient documentation

## 2023-08-22 DIAGNOSIS — F1910 Other psychoactive substance abuse, uncomplicated: Secondary | ICD-10-CM | POA: Insufficient documentation

## 2023-08-22 LAB — CBC WITH DIFFERENTIAL/PLATELET
Abs Immature Granulocytes: 0.04 10*3/uL (ref 0.00–0.07)
Basophils Absolute: 0 10*3/uL (ref 0.0–0.1)
Basophils Relative: 0 %
Eosinophils Absolute: 0 10*3/uL (ref 0.0–0.5)
Eosinophils Relative: 0 %
HCT: 39.1 % (ref 36.0–46.0)
Hemoglobin: 11.6 g/dL — ABNORMAL LOW (ref 12.0–15.0)
Immature Granulocytes: 0 %
Lymphocytes Relative: 7 %
Lymphs Abs: 0.8 10*3/uL (ref 0.7–4.0)
MCH: 24 pg — ABNORMAL LOW (ref 26.0–34.0)
MCHC: 29.7 g/dL — ABNORMAL LOW (ref 30.0–36.0)
MCV: 80.8 fL (ref 80.0–100.0)
Monocytes Absolute: 0.3 10*3/uL (ref 0.1–1.0)
Monocytes Relative: 3 %
Neutro Abs: 9.6 10*3/uL — ABNORMAL HIGH (ref 1.7–7.7)
Neutrophils Relative %: 90 %
Platelets: 225 10*3/uL (ref 150–400)
RBC: 4.84 MIL/uL (ref 3.87–5.11)
RDW: 17 % — ABNORMAL HIGH (ref 11.5–15.5)
WBC: 10.7 10*3/uL — ABNORMAL HIGH (ref 4.0–10.5)
nRBC: 0 % (ref 0.0–0.2)

## 2023-08-22 LAB — URINALYSIS, ROUTINE W REFLEX MICROSCOPIC
Bacteria, UA: NONE SEEN
Bilirubin Urine: NEGATIVE
Glucose, UA: NEGATIVE mg/dL
Ketones, ur: 80 mg/dL — AB
Leukocytes,Ua: NEGATIVE
Nitrite: NEGATIVE
Protein, ur: 30 mg/dL — AB
Specific Gravity, Urine: 1.025 (ref 1.005–1.030)
pH: 5 (ref 5.0–8.0)

## 2023-08-22 LAB — BASIC METABOLIC PANEL
Anion gap: 16 — ABNORMAL HIGH (ref 5–15)
BUN: 17 mg/dL (ref 6–20)
CO2: 13 mmol/L — ABNORMAL LOW (ref 22–32)
Calcium: 9.9 mg/dL (ref 8.9–10.3)
Chloride: 106 mmol/L (ref 98–111)
Creatinine, Ser: <0.3 mg/dL — ABNORMAL LOW (ref 0.44–1.00)
Glucose, Bld: 81 mg/dL (ref 70–99)
Potassium: 3.9 mmol/L (ref 3.5–5.1)
Sodium: 135 mmol/L (ref 135–145)

## 2023-08-22 LAB — RAPID URINE DRUG SCREEN, HOSP PERFORMED
Amphetamines: POSITIVE — AB
Barbiturates: NOT DETECTED
Benzodiazepines: POSITIVE — AB
Cocaine: NOT DETECTED
Opiates: NOT DETECTED
Tetrahydrocannabinol: POSITIVE — AB

## 2023-08-22 LAB — HCG, QUANTITATIVE, PREGNANCY: hCG, Beta Chain, Quant, S: >250000 m[IU]/mL — ABNORMAL HIGH (ref <5)

## 2023-08-22 MED ORDER — SODIUM CHLORIDE 0.9 % IV BOLUS
500.0000 mL | Freq: Once | INTRAVENOUS | Status: AC
  Administered 2023-08-22: 500 mL via INTRAVENOUS

## 2023-08-22 MED ORDER — DIAZEPAM 5 MG/ML IJ SOLN
2.5000 mg | Freq: Once | INTRAMUSCULAR | Status: AC
  Administered 2023-08-22: 2.5 mg via INTRAVENOUS
  Filled 2023-08-22: qty 2

## 2023-08-22 MED ORDER — PRENATAL COMPLETE 14-0.4 MG PO TABS: 1.0000 | ORAL_TABLET | Freq: Every day | ORAL | 0 refills | Status: DC

## 2023-08-22 MED ORDER — DIAZEPAM 5 MG PO TABS
5.0000 mg | ORAL_TABLET | Freq: Once | ORAL | Status: DC
  Filled 2023-08-22: qty 1

## 2023-08-22 NOTE — ED Triage Notes (Signed)
Pt is BIB from [facility] for xanax withdrawal. Pt's boyfriend called EMS because he is afraid she will have a seizure like she has in the past when she has tried to quit. Pt is 2 months pregnant & hasn't take any xanax in 48 hours.   Pt refuses to answer questions at time of triage. When trying to assess pt she just closes her eyes whenever prompted to answer a question.
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
## 2023-08-22 NOTE — ED Provider Notes (Signed)
Care transferred to me.  Ultrasound shows viable IUP at about 7 weeks and 1 day.  Patient is otherwise comfortable and does not appear to be in withdrawal.  Discussed with patient and significant other, no indication for admission at this point but also cannot just stop Xanax cold [place].  Needs to follow-up with PCP and OB to discuss tapering strategy.  However no indication for admission and appears stable for discharge home with return precautions.   Pricilla Loveless, MD 08/22/23 [phone]

## 2023-08-22 NOTE — Discharge Instructions (Addendum)
You were seen today with concerns for polysubstance abuse and benzodiazepine withdrawal.  Benzodiazepine withdrawal is very serious.  You should taper these medications under medical supervision.  Especially given your pregnancy this is very important.  Additionally you need to start a prenatal vitamin and follow-up with OB/GYN.

## 2023-08-28 ENCOUNTER — Encounter: Payer: Self-pay | Admitting: Family Medicine

## 2023-09-03 NOTE — L&D Delivery Note (Signed)
 OB/GYN Faculty Practice Delivery Note  Gabrielle Nguyen is a 28 y.o. [id number] s/p NSVD at [redacted]w[redacted]d. She was admitted for IOL.   ROM: 0h 10m with light meconium-stained fluid GBS Status: POSITIVE/-- (07/21 2047) Maximum Maternal Temperature: Temp (24hrs), Avg:98.6 F (37 C), Min:98.6 F (37 C), Max:98.6 F (37 C)   Labor Progress: Initial SVE: 6/90/-1. Quickly progressed to complete. AROM performed at start of pushing.  Delivery Date/Time: 03/22/24 2322 Delivery: Called to room and patient was complete and feeling urge to push. After approximately 30 minutes of pushing, bladder drained with red rubber for more space. Also at +3 station, babe had a 6 minute deceleration. Heart rate recovered after consenting for a vacuum, and patient quickly delivered. Head delivered OA to ROA. No nuchal cord present. Shoulder and body delivered in usual fashion. Infant with spontaneous cry, placed on mother's abdomen, dried and stimulated. Cord clamped x 2 after +1-minute delay, and cut by FOB. Cord gases not obtained. Cord blood drawn. Pitocin  and TXA given at delivery given low starting Hgb. Placenta detached but felt to be stuck in rapidly closing cervix. Manually extracted. Uterine sweep performed to ensure no fragments. Ancef  2g x1 . Fundus firm with massage and Pitocin . Labia, perineum, and vagina inspected with no lacerations. Mom and baby to postpartum. Baby Weight: 6 lb 14 oz  Placenta: 3 vessel, intact. Sent to pathology Complications: None Lacerations: None EBL: 300 mL Anesthesia: epidural  Infant: baby girl Sophia APGAR (1 MIN): 8  APGAR (5 MINS): 9  APGAR (10 MINS):    Gabrielle CHRISTELLA Moats, MD [facility] Family Medicine Fellow, [facility] for [facility], [facility] 03/23/2024, 12:37 AM

## 2023-09-08 ENCOUNTER — Encounter: Payer: Self-pay | Admitting: Family Medicine

## 2023-09-25 ENCOUNTER — Encounter: Payer: Self-pay | Admitting: Family Medicine

## 2023-09-25 ENCOUNTER — Ambulatory Visit: Payer: Self-pay | Admitting: Clinical

## 2023-09-25 ENCOUNTER — Ambulatory Visit: Payer: Self-pay | Admitting: Family Medicine

## 2023-09-25 VITALS — BP 106/67 | HR 122

## 2023-09-25 DIAGNOSIS — O3432 Maternal care for cervical incompetence, second trimester: Secondary | ICD-10-CM

## 2023-09-25 DIAGNOSIS — O0990 Supervision of high risk pregnancy, unspecified, unspecified trimester: Secondary | ICD-10-CM

## 2023-09-25 DIAGNOSIS — I5181 Takotsubo syndrome: Secondary | ICD-10-CM

## 2023-09-25 DIAGNOSIS — R9431 Abnormal electrocardiogram [ECG] [EKG]: Secondary | ICD-10-CM

## 2023-09-25 DIAGNOSIS — Z124 Encounter for screening for malignant neoplasm of cervix: Secondary | ICD-10-CM

## 2023-09-25 DIAGNOSIS — O0991 Supervision of high risk pregnancy, unspecified, first trimester: Secondary | ICD-10-CM

## 2023-09-25 DIAGNOSIS — J45909 Unspecified asthma, uncomplicated: Secondary | ICD-10-CM

## 2023-09-25 DIAGNOSIS — Z7189 Other specified counseling: Secondary | ICD-10-CM

## 2023-09-25 DIAGNOSIS — F4310 Post-traumatic stress disorder, unspecified: Secondary | ICD-10-CM

## 2023-09-25 DIAGNOSIS — F1320 Sedative, hypnotic or anxiolytic dependence, uncomplicated: Secondary | ICD-10-CM

## 2023-09-25 DIAGNOSIS — Z3A12 12 weeks gestation of pregnancy: Secondary | ICD-10-CM

## 2023-09-25 DIAGNOSIS — O219 Vomiting of pregnancy, unspecified: Secondary | ICD-10-CM

## 2023-09-25 DIAGNOSIS — F1910 Other psychoactive substance abuse, uncomplicated: Secondary | ICD-10-CM

## 2023-09-25 DIAGNOSIS — O3431 Maternal care for cervical incompetence, first trimester: Secondary | ICD-10-CM

## 2023-09-25 MED ORDER — FLUTICASONE PROPIONATE HFA 110 MCG/ACT IN AERO
1.0000 | INHALATION_SPRAY | Freq: Two times a day (BID) | RESPIRATORY_TRACT | 12 refills | Status: AC
Start: 2023-09-25 — End: ?

## 2023-09-25 MED ORDER — VITAFOL GUMMIES 3.33-0.333-34.8 MG PO CHEW: 3.0000 | CHEWABLE_TABLET | Freq: Every day | ORAL | 11 refills | Status: AC

## 2023-09-25 MED ORDER — VITAFOL GUMMIES 3.33-0.333-34.8 MG PO CHEW: 3.0000 | CHEWABLE_TABLET | Freq: Every day | ORAL | 11 refills | Status: DC
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
## 2023-09-25 NOTE — Patient Instructions (Signed)
Second Trimester of Pregnancy  The second trimester of pregnancy is from week 14 through week 27. This is months 4 through 6 of pregnancy. During the second trimester: Morning sickness is less or has stopped. You may have more energy. You may feel hungry more often. At this time, your unborn baby is growing very fast. At the end of the sixth month, the unborn baby may be up to 12 inches long and weigh about 1 pounds. You will likely start to feel the baby move between 16 and 20 weeks of pregnancy. Body changes during your second trimester Your body continues to change during this time. The changes usually go away after your baby is born. Physical changes You will gain more weight. Your belly will get bigger. You may begin to get stretch marks on your hips, belly, and breasts. Your breasts will keep growing and may hurt. You may get dark spots or blotches on your face. A dark line from your belly button to the pubic area may appear. This line is called linea nigra. Your hair may grow faster and get thicker. Health changes You may have headaches. You may have heartburn. You may pee more often. You may have swollen, bulging veins (varicose veins). You may have trouble pooping (constipation), or swollen veins in the butt that can itch or get painful (hemorrhoids). You may have back pain. This is caused by: Weight gain. Pregnancy hormones that are relaxing the joints in your pelvis. Follow these instructions at home: Medicines Talk to your health care provider if you're taking medicines. Ask if the medicines are safe to take during pregnancy. Your provider may change the medicines that you take. Do not take any medicines unless told to by your provider. Take a prenatal vitamin that has at least 600 micrograms (mcg) of folic acid. Do not use herbal medicines, illegal drugs, or medicines that are not approved by your provider. Eating and drinking While you're pregnant your body needs  extra food for your growing baby. Talk with your provider about what to eat while pregnant. Activity Most women are able to exercise during pregnancy. Exercises may need to change as your pregnancy goes on. Talk to your provider about your activities and exercise routines. Relieving pain and discomfort Wear a good, supportive bra if your breasts hurt. Rest with your legs raised if you have leg cramps or low back pain. Take warm sitz baths to soothe pain from hemorrhoids. Use hemorrhoid cream if your provider says it's okay. Do not douche. Do not use tampons or scented pads. Do not use hot tubs, steam rooms, or saunas. Safety Wear your seatbelt at all times when you're in a car. Talk to your provider if someone hits you, hurts you, or yells at you. Talk with your provider if you're feeling sad or have thoughts of hurting yourself. Lifestyle Certain things can be harmful while you're pregnant. It's best to avoid the following: Do not drink alcohol,smoke, vape, or use products with nicotine or tobacco in them. If you need help quitting, talk with your provider. Avoid cat litter boxes and soil used by cats. These things carry germs that can cause harm to your pregnancy and your baby. General instructions Keep all follow-up visits. It helps you and your unborn baby stay as healthy as possible. Write down your questions. Take them to your prenatal visits. Your provider will: Talk with you about your overall health. Give you advice or refer you to specialists who can help with different needs,  including: Prenatal education classes. Mental health and counseling. Foods and healthy eating. Ask for help if you need help with food. Where to find more information American Pregnancy Association: americanpregnancy.org [employer]necologists: acog.org Office on [employer] Health: TravelLesson.ca Contact a health care provider if: You have a headache that does not go away  when you take medicine. You have any of these problems: You can't eat or drink. You throw up or feel like you may throw up. You have watery poop (diarrhea) for 2 days or more. You have pain when you pee or your pee smells bad. You have been sick for 2 days or more and are not getting better. Contact your provider right away if: You have any of these coming from your vagina: Abnormal discharge. Bad-smelling fluid. Bleeding. Your baby is moving less than usual. You have contractions, belly cramping, or have pain in your pelvis or lower back. You have symptoms of high blood pressure or preeclampsia. These include: A severe, throbbing headache that does not go away. Sudden or extreme swelling of your face, hands, legs, or feet. Vision problems: You see spots. You have blurry vision. Your eyes are sensitive to light. If you can't reach the provider, go to an urgent care or emergency room. Get help right away if: You faint, become confused, or can't think clearly. You have chest pain or trouble breathing. You have any kind of injury, such as from a fall or a car crash. These symptoms may be an emergency. Call 911 right away. Do not wait to see if the symptoms will go away. Do not drive yourself to the hospital. This information is not intended to replace advice given to you by your health care provider. Make sure you discuss any questions you have with your health care provider. Document Revised: 05/22/2023 Document Reviewed: 12/20/2022 Elsevier Patient Education  2024 [employer]. Birth Control Options Birth control is also called contraception. Birth control prevents pregnancy. There are many types of birth control. Work with your health care provider to find the best option for you. Birth control that uses hormones These types of birth control have hormones in them to prevent pregnancy. Birth control implant This is a small tube that is put into the skin of your arm. The tube can  stay in for up to 3 years. Birth control shot These are shots you get every 3 months. Birth control pills This is a pill you take every day. You need to take it at the same time each day. Birth control patch This is a patch that you put on your skin. You change it 1 time each week for 3 weeks. After that, you take the patch off for 1 week. Vaginal ring  This is a soft plastic ring that you put in your vagina. The ring is left in for 3 weeks. Then, you take it out for 1 week. Then, you put a new ring in. Barrier methods  Female condom This is a thin covering that you put on the penis before sex. The condom is thrown away after sex. Female condom This is a soft, loose covering that you put in the vagina before sex. The condom is thrown away after sex. Diaphragm A diaphragm is a soft barrier that is shaped like a bowl. It must be made to fit your body. You put it in the vagina before sex with a chemical that kills sperm called spermicide. A diaphragm should be left in the vagina for  6-8 hours after sex and taken out within 24 hours. You need to replace a diaphragm: Every 1-2 years. After giving birth. After gaining more than 15 lb (6.8 kg). If you have surgery on your pelvis. Cervical cap This is a small, soft cup that fits over the cervix. The cervix is the lowest part of the uterus. It's put in the vagina before sex, along with spermicide. The cap must be made for you. The cap should be left in for 6-8 hours after sex. It is taken out within 48 hours. A cervical cap must be prescribed and fit to your body by a provider. It should be replaced every 2 years. Sponge This is a small sponge that is put into the vagina before sex. It must be left in for at least 6 hours after sex. It must be taken out within 30 hours and thrown away. Spermicides These are chemicals that kill or stop sperm from getting into the uterus. They may be a pill, cream, jelly, or foam that you put into your vagina. They  should be used at least 10-15 minutes before sex. Intrauterine device An intrauterine device (IUD) is a device that's put in the uterus by a provider. There are two types: Hormone IUD. This kind can stay in for 3-5 years. Copper IUD. This kind can stay in for 10 years. Permanent birth control Female tubal ligation This is surgery to block the fallopian tubes. Female sterilization This is a surgery, called a vasectomy, to tie off the tubes that carry sperm in men. This method takes 3 months to work. Other forms of birth control must be used for 3 months. Natural planning methods This means not having sex on the days the female partner could get pregnant. Here are some types of natural planning birth control: Using a calendar: To keep track of the length of each menstrual cycle. To find out what days pregnancy can happen. To plan to not have sex on days when pregnancy can happen. Watching for signs of ovulation and not having sex during this time. The female partner can check for ovulation by keeping track of their temperature each day. They can also look for changes in the mucus that comes from the cervix. Where to find more information Centers for Disease Control and Prevention: TonerPromos.no. Then: Enter "birth control" in the search box. This information is not intended to replace advice given to you by your health care provider. Make sure you discuss any questions you have with your health care provider. Document Revised: 05/22/2023 Document Reviewed: 10/15/2022 Elsevier Patient Education  2024 [employer].

## 2023-09-25 NOTE — BH Specialist Note (Signed)
Integrated Behavioral Health Initial In-Person Visit  MRN: [id number] Name: Gabrielle Nguyen  Brief introduction to integrated behavioral health services; Pt will call Asher Muir at [phone] as needed.   Valetta Close Niko Penson, LCSW
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
## 2023-09-25 NOTE — Progress Notes (Signed)
Chaplain offered supportive presence at pt's outpatient appointment at the [facility]. Chaplain introduced spiritual care and offered support at [place] began her REACH experience for a new OB appointment.  Will continue to follow via REACH clinic.   Please page as further needs arise.  [name]. Carley Hammed, M.Div. [facility] Chaplain Pager [phone] Office [phone]

## 2023-09-26 DIAGNOSIS — F1320 Sedative, hypnotic or anxiolytic dependence, uncomplicated: Secondary | ICD-10-CM | POA: Insufficient documentation

## 2023-09-26 LAB — HCV INTERPRETATION

## 2023-09-26 LAB — CBC/D/PLT+RPR+RH+ABO+RUBIGG...
Antibody Screen: NEGATIVE
Basophils Absolute: 0 10*3/uL (ref 0.0–0.2)
Basos: 0 %
EOS (ABSOLUTE): 0.2 10*3/uL (ref 0.0–0.4)
Eos: 3 %
HCV Ab: NONREACTIVE
HIV Screen 4th Generation wRfx: NONREACTIVE
Hematocrit: 28.1 % — ABNORMAL LOW (ref 34.0–46.6)
Hemoglobin: 8.7 g/dL — ABNORMAL LOW (ref 11.1–15.9)
Hepatitis B Surface Ag: NEGATIVE
Immature Grans (Abs): 0 10*3/uL (ref 0.0–0.1)
Immature Granulocytes: 0 %
Lymphocytes Absolute: 1.6 10*3/uL (ref 0.7–3.1)
Lymphs: 27 %
MCH: 23 pg — ABNORMAL LOW (ref 26.6–33.0)
MCHC: 31 g/dL — ABNORMAL LOW (ref 31.5–35.7)
MCV: 74 fL — ABNORMAL LOW (ref 79–97)
Monocytes Absolute: 0.3 10*3/uL (ref 0.1–0.9)
Monocytes: 5 %
Neutrophils Absolute: 3.7 10*3/uL (ref 1.4–7.0)
Neutrophils: 65 %
Platelets: 188 10*3/uL (ref 150–450)
RBC: 3.78 x10E6/uL (ref 3.77–5.28)
RDW: 17 % — ABNORMAL HIGH (ref 11.7–15.4)
RPR Ser Ql: NONREACTIVE
Rh Factor: POSITIVE
Rubella Antibodies, IGG: 4.37 {index} (ref >0.99)
WBC: 5.8 10*3/uL (ref 3.4–10.8)

## 2023-09-26 LAB — COMPREHENSIVE METABOLIC PANEL
ALT: 11 [IU]/L (ref 0–32)
AST: 16 [IU]/L (ref 0–40)
Albumin: 4 g/dL (ref 4.0–5.0)
Alkaline Phosphatase: 48 [IU]/L (ref 44–121)
BUN/Creatinine Ratio: 17 (ref 9–23)
BUN: 8 mg/dL (ref 6–20)
Bilirubin Total: <0.2 mg/dL (ref 0.0–1.2)
CO2: 18 mmol/L — ABNORMAL LOW (ref 20–29)
Calcium: 8.9 mg/dL (ref 8.7–10.2)
Chloride: 104 mmol/L (ref 96–106)
Creatinine, Ser: 0.47 mg/dL — ABNORMAL LOW (ref 0.57–1.00)
Globulin, Total: 2.4 g/dL (ref 1.5–4.5)
Glucose: 88 mg/dL (ref 70–99)
Potassium: 3.7 mmol/L (ref 3.5–5.2)
Sodium: 138 mmol/L (ref 134–144)
Total Protein: 6.4 g/dL (ref 6.0–8.5)
eGFR: 132 mL/min/{1.73_m2} (ref >59)

## 2023-09-26 LAB — PROTEIN / CREATININE RATIO, URINE
Creatinine, Urine: 21 mg/dL
Protein, Ur: 10.4 mg/dL
Protein/Creat Ratio: 495 mg/g{creat} — ABNORMAL HIGH (ref 0–200)

## 2023-09-26 LAB — HEMOGLOBIN A1C
Est. average glucose Bld gHb Est-mCnc: 111 mg/dL
Hgb A1c MFr Bld: 5.5 % (ref 4.8–5.6)

## 2023-09-26 MED ORDER — SCOPOLAMINE 1 MG/3DAYS TD PT72: 1.0000 | MEDICATED_PATCH | TRANSDERMAL | 11 refills | Status: DC

## 2023-09-26 MED ORDER — DIAZEPAM 10 MG PO TABS
10.0000 mg | ORAL_TABLET | Freq: Two times a day (BID) | ORAL | 0 refills | Status: AC
Start: 2023-09-26 — End: 2023-10-10

## 2023-09-26 MED ORDER — PROMETHAZINE HCL 25 MG PO TABS
25.0000 mg | ORAL_TABLET | Freq: Four times a day (QID) | ORAL | 11 refills | Status: DC | PRN
Start: 2023-09-26 — End: 2024-03-25

## 2023-09-27 LAB — URINE CULTURE, OB REFLEX

## 2023-09-27 LAB — CULTURE, OB URINE

## 2023-09-29 ENCOUNTER — Encounter: Payer: Self-pay | Admitting: Family Medicine

## 2023-09-29 DIAGNOSIS — O1210 Gestational proteinuria, unspecified trimester: Secondary | ICD-10-CM | POA: Insufficient documentation

## 2023-09-29 NOTE — Addendum Note (Signed)
Addended by: Merian Capron on: 09/29/2023 01:20 PM   Modules accepted: Orders

## 2023-09-30 LAB — AMPHETAMINES, MS, UR RFX
Amphetamine: 362 ng/mg{creat}
Amphetamines Confirmation: POSITIVE
MDA (Ecstasy metabolite): NOT DETECTED ng/mg{creat}
MDMA (Ecstasy): NOT DETECTED ng/mg{creat}
Methamphetamine: 2390 ng/mg{creat}

## 2023-09-30 LAB — TOXASSURE FLEX 15, UR
6-ACETYLMORPHINE IA: NEGATIVE ng/mL
7-aminoclonazepam: NOT DETECTED ng/mg{creat}
Alpha-hydroxyalprazolam: NOT DETECTED ng/mg{creat}
Alpha-hydroxymidazolam: NOT DETECTED ng/mg{creat}
Alpha-hydroxytriazolam: NOT DETECTED ng/mg{creat}
Alprazolam: NOT DETECTED ng/mg{creat}
BARBITURATES IA: NEGATIVE ng/mL
BUPRENORPHINE: POSITIVE
Benzodiazepines: NEGATIVE
Buprenorphine: >4762 ng/mg{creat}
CANNABINOIDS IA: NEGATIVE ng/mL
COCAINE METABOLITE IA: NEGATIVE ng/mL
Clonazepam: NOT DETECTED ng/mg{creat}
Creatinine: 21 mg/dL
Desalkylflurazepam: NOT DETECTED ng/mg{creat}
Desmethyldiazepam: NOT DETECTED ng/mg{creat}
Desmethylflunitrazepam: NOT DETECTED ng/mg{creat}
Diazepam: NOT DETECTED ng/mg{creat}
ETHYL ALCOHOL Enzymatic: NEGATIVE g/dL
FENTANYL: POSITIVE
Fentanyl: 433 ng/mg{creat}
Flunitrazepam: NOT DETECTED ng/mg{creat}
Lorazepam: NOT DETECTED ng/mg{creat}
METHADONE IA: NEGATIVE ng/mL
METHADONE MTB IA: NEGATIVE ng/mL
Midazolam: NOT DETECTED ng/mg{creat}
Norbuprenorphine: 43 ng/mg{creat}
Norfentanyl: 1824 ng/mg{creat}
OPIATE CLASS IA: NEGATIVE ng/mL
OXYCODONE CLASS IA: NEGATIVE ng/mL
Oxazepam: NOT DETECTED ng/mg{creat}
PHENCYCLIDINE IA: NEGATIVE ng/mL
TAPENTADOL, IA: NEGATIVE ng/mL
TRAMADOL IA: NEGATIVE ng/mL
Temazepam: NOT DETECTED ng/mg{creat}

## 2023-09-30 NOTE — Addendum Note (Signed)
Addended by: Marjo Bicker on: 09/30/2023 12:47 PM   Modules accepted: Orders

## 2023-10-01 NOTE — Progress Notes (Signed)
Results significantly diverge from history given during visit. Per patient she was using 2-4 mg of Xanax daily but no benzodiazapenes present. Denied any knowing use of opioids. Very concerning that patient has fentanyl in her system as it significantly raises the risk for overdose. Will discuss getting back on methadone or transitioning to suboxone at next visit and ensure she has Narcan at home. Unclear etiology of methamphetamines (adderall etc vs illicit) though I know she is not prescribed any. Will discuss further at next visit which is early next week.
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
## 2023-10-07 ENCOUNTER — Encounter: Payer: Self-pay | Admitting: Family Medicine

## 2023-10-07 ENCOUNTER — Ambulatory Visit (HOSPITAL_COMMUNITY): Admission: RE | Admit: 2023-10-07 | Payer: Self-pay | Source: Ambulatory Visit

## 2023-10-07 ENCOUNTER — Other Ambulatory Visit: Payer: Self-pay | Admitting: Family Medicine

## 2023-10-07 ENCOUNTER — Inpatient Hospital Stay (HOSPITAL_COMMUNITY)
Admission: RE | Admit: 2023-10-07 | Discharge: 2023-10-07 | Disposition: A | Discharge status: Home / Self Care | Payer: Self-pay | Source: Ambulatory Visit | Attending: Internal Medicine | Admitting: Internal Medicine

## 2023-10-07 ENCOUNTER — Other Ambulatory Visit: Payer: Self-pay

## 2023-10-07 ENCOUNTER — Ambulatory Visit (INDEPENDENT_AMBULATORY_CARE_PROVIDER_SITE_OTHER): Payer: Self-pay | Admitting: Family Medicine

## 2023-10-07 VITALS — BP 91/62 | HR 100 | Wt 122.1 lb

## 2023-10-07 DIAGNOSIS — J45909 Unspecified asthma, uncomplicated: Secondary | ICD-10-CM

## 2023-10-07 DIAGNOSIS — O3431 Maternal care for cervical incompetence, first trimester: Secondary | ICD-10-CM

## 2023-10-07 DIAGNOSIS — O1212 Gestational proteinuria, second trimester: Secondary | ICD-10-CM

## 2023-10-07 DIAGNOSIS — F1320 Sedative, hypnotic or anxiolytic dependence, uncomplicated: Secondary | ICD-10-CM

## 2023-10-07 DIAGNOSIS — O0991 Supervision of high risk pregnancy, unspecified, first trimester: Secondary | ICD-10-CM

## 2023-10-07 DIAGNOSIS — O0990 Supervision of high risk pregnancy, unspecified, unspecified trimester: Secondary | ICD-10-CM

## 2023-10-07 DIAGNOSIS — Z3A13 13 weeks gestation of pregnancy: Secondary | ICD-10-CM

## 2023-10-07 DIAGNOSIS — F1910 Other psychoactive substance abuse, uncomplicated: Secondary | ICD-10-CM

## 2023-10-07 DIAGNOSIS — R9431 Abnormal electrocardiogram [ECG] [EKG]: Secondary | ICD-10-CM

## 2023-10-07 DIAGNOSIS — F4310 Post-traumatic stress disorder, unspecified: Secondary | ICD-10-CM

## 2023-10-07 DIAGNOSIS — O1211 Gestational proteinuria, first trimester: Secondary | ICD-10-CM

## 2023-10-07 DIAGNOSIS — O3432 Maternal care for cervical incompetence, second trimester: Secondary | ICD-10-CM

## 2023-10-07 DIAGNOSIS — Z7189 Other specified counseling: Secondary | ICD-10-CM

## 2023-10-07 DIAGNOSIS — I5181 Takotsubo syndrome: Secondary | ICD-10-CM

## 2023-10-07 NOTE — Progress Notes (Signed)
 Subjective:  Gabrielle Nguyen is a 28 y.o. [id number] at [redacted]w[redacted]d being seen today for ongoing prenatal care.  She is currently monitored for the following issues for this high-risk pregnancy and has MDD (major depressive disorder); Panic disorder; Asthma; Anxiety with depression; Opioid use disorder; Hyperemesis affecting pregnancy, antepartum; Nausea/vomiting in pregnancy; Prolonged Q-T interval on ECG; Elevated troponin; Acute systolic CHF (congestive heart failure) (HCC); Takotsubo cardiomyopathy; PTSD (post-traumatic stress disorder); Red Chart Rounds Patient; Cervical insufficiency during pregnancy in second trimester, antepartum; Generalized anxiety disorder; Anemia complicating pregnancy, second trimester; Polysubstance abuse (HCC); Substance induced mood disorder (HCC); Withdrawal from benzodiazepine (HCC); Hypokalemia; Supervision of high risk pregnancy, antepartum; Severe benzodiazepine use disorder (HCC); and Proteinuria affecting pregnancy on their problem list.  Patient reports no complaints.  Contractions: Not present. Vag. Bleeding: None.   . Denies leaking of fluid.   The following portions of the patient's history were reviewed and updated as appropriate: allergies, current medications, past family history, past medical history, past social history, past surgical history and problem list. Problem list updated.  Objective:   Vitals:   10/07/23 1647  BP: 91/62  Pulse: 100  Weight: 122 lb 1.6 oz (55.4 kg)    Fetal Status:           General:  Alert, oriented and cooperative. Patient is in no acute distress.  Skin: Skin is warm and dry. No rash noted.   Cardiovascular: Normal heart rate noted  Respiratory: Normal respiratory effort, no problems with respiration noted  Abdomen: Soft, gravid, appropriate for gestational age. Pain/Pressure: Present     Pelvic: Vag. Bleeding: None     Cervical exam deferred        Extremities: Normal range of motion.  Edema: None  Mental Status: Normal  mood and affect. Normal behavior. Normal judgment and thought content.   Urinalysis:        Assessment and Plan:  Pregnancy: [id number] at [redacted]w[redacted]d  1. Supervision of high risk pregnancy, antepartum (Primary) BP normal, FHR normal on bedside US   2. Polysubstance abuse (HCC) Long hx of PSUD, at last visit reported using 2-4 mg of Xanax  daily and no opioids UDS profoundly contradicted this history with no benzodiazapenes present but amphetamines, buprenorphine , and fentanyl  (see above) Asked about this, unable to provide any sort of cogent explanation Discussed that given discrepancy between her reported use of high doses of daily Xanax  and lack of any metabolites in her urine, I will not be providing further prescriptions for benzodiazapenes In addition given length of time that would have needed to pass for her to have no benzo metabolites on her test would have put her firmly in the time period when withdrawal symptoms would have begun and she was not exhibiting any of those at the time of the sample, therefore I am not concerned about benzo withdrawal I also do not feel any sort of taper from the rx she was given is necessary given short duration and auto tapering effect of valium   Regarding amphetamines she did endorse taking an Adderall, I advised against this given she is not prescribed this and has no indication  Regarding fentanyl  and buprenoprhine in urine I expressed my sincere concern that given the presence of opioids, regardless of whether they are taken intentionally or not, this significantly raises her long term risk of overdose and death I offered to start suboxone  or refer her for methadone  treatment which she has successfully been on in the past, she declined both  UDS obtained after  verbal consent Will continue to encourage transition onto OUD MAT to reduce risk as well as providing support for other substance use disorders  - ToxAssure Flex 15, Ur  3. Takotsubo  cardiomyopathy Diagnosed at 13 weeks during last pregnancy in 01/2021, initial EF was 30% Has had recovery of EF on last TTE on 07/15/2021, EF 55-60% at that time Repeat TTE ordered and was scheduled for this and visit with Dr. Luz but overslept and did not go to it Encouraged her to call the office to reschedule   4. Red Chart Rounds Patient   5. PTSD (post-traumatic stress disorder) Reported at last visit that she was in therapy, on clarification this is not the case I strongly recommended and she agreed to a referral to psychiatry  6. Prolonged Q-T interval on ECG ECG at next Cards visit QTC 582 on most recent ECG from 06/02/2023 Avoid QT prolonging meds Stick with phenergan  and scopolamine  when possible  7. Cervical insufficiency during pregnancy in second trimester, antepartum At one point in last pregnancy had US  showing 1.6 cm cervix Follow up shortly after was 3-4 cm in length, possible dynamic cervix Will get cervical length at 16 weeks  8. Uncomplicated asthma, unspecified asthma severity, unspecified whether persistent Started on flovent  at last visit, reports she has not yet started taking it  9. Proteinuria affecting pregnancy in second trimester UPCR unexpectedly 0.495 at last visit Recheck today - Protein / creatinine ratio, urine  Preterm labor symptoms and general obstetric precautions including but not limited to vaginal bleeding, contractions, leaking of fluid and fetal movement were reviewed in detail with the patient. Please refer to After Visit Summary for other counseling recommendations.  Return in about 2 weeks (around 10/21/2023) for REACH clinic.   Lola Donnice HERO, MD

## 2023-10-10 LAB — TOXASSURE FLEX 15, UR
6-ACETYLMORPHINE IA: NEGATIVE ng/mL
7-aminoclonazepam: 71 ng/mg{creat}
Alpha-hydroxyalprazolam: NOT DETECTED ng/mg{creat}
Alpha-hydroxymidazolam: NOT DETECTED ng/mg{creat}
Alpha-hydroxytriazolam: NOT DETECTED ng/mg{creat}
Alprazolam: NOT DETECTED ng/mg{creat}
BARBITURATES IA: NEGATIVE ng/mL
BUPRENORPHINE: NEGATIVE
Benzodiazepines: POSITIVE
Buprenorphine: NOT DETECTED ng/mg{creat}
COCAINE METABOLITE IA: NEGATIVE ng/mL
Clonazepam: NOT DETECTED ng/mg{creat}
Creatinine: 168 mg/dL
Desalkylflurazepam: NOT DETECTED ng/mg{creat}
Desmethyldiazepam: 236 ng/mg{creat}
Desmethylflunitrazepam: NOT DETECTED ng/mg{creat}
Diazepam: NOT DETECTED ng/mg{creat}
ETHYL ALCOHOL Enzymatic: NEGATIVE g/dL
FENTANYL: POSITIVE
Fentanyl: 133 ng/mg{creat}
Flunitrazepam: NOT DETECTED ng/mg{creat}
Lorazepam: NOT DETECTED ng/mg{creat}
METHADONE IA: NEGATIVE ng/mL
METHADONE MTB IA: NEGATIVE ng/mL
Midazolam: NOT DETECTED ng/mg{creat}
Norbuprenorphine: NOT DETECTED ng/mg{creat}
Norfentanyl: >595 ng/mg{creat}
OPIATE CLASS IA: NEGATIVE ng/mL
OXYCODONE CLASS IA: NEGATIVE ng/mL
Oxazepam: >1190 ng/mg{creat}
PHENCYCLIDINE IA: NEGATIVE ng/mL
TAPENTADOL, IA: NEGATIVE ng/mL
TRAMADOL IA: NEGATIVE ng/mL
Temazepam: 1163 ng/mg{creat}

## 2023-10-10 LAB — PROTEIN / CREATININE RATIO, URINE
Creatinine, Urine: 173 mg/dL
Protein, Ur: 12 mg/dL
Protein/Creat Ratio: 69 mg/g{creat} (ref 0–200)

## 2023-10-10 LAB — AMPHETAMINES, MS, UR RFX
Amphetamine: 906 ng/mg{creat}
Amphetamines Confirmation: POSITIVE
MDA (Ecstasy metabolite): NOT DETECTED ng/mg{creat}
MDMA (Ecstasy): NOT DETECTED ng/mg{creat}
Methamphetamine: >2976 ng/mg{creat}

## 2023-10-10 LAB — CANNABINOIDS, MS, UR RFX
Cannabinoids Confirmation: POSITIVE
Carboxy-THC: 199 ng/mg{creat}

## 2023-10-21 ENCOUNTER — Ambulatory Visit: Payer: Self-pay | Admitting: Family Medicine

## 2023-10-21 VITALS — BP 107/66 | HR 142 | Wt 124.6 lb

## 2023-10-21 DIAGNOSIS — O3432 Maternal care for cervical incompetence, second trimester: Secondary | ICD-10-CM

## 2023-10-21 DIAGNOSIS — O0990 Supervision of high risk pregnancy, unspecified, unspecified trimester: Secondary | ICD-10-CM

## 2023-10-21 DIAGNOSIS — O0992 Supervision of high risk pregnancy, unspecified, second trimester: Secondary | ICD-10-CM

## 2023-10-21 DIAGNOSIS — O1212 Gestational proteinuria, second trimester: Secondary | ICD-10-CM

## 2023-10-21 DIAGNOSIS — R9431 Abnormal electrocardiogram [ECG] [EKG]: Secondary | ICD-10-CM

## 2023-10-21 DIAGNOSIS — F1910 Other psychoactive substance abuse, uncomplicated: Secondary | ICD-10-CM

## 2023-10-21 DIAGNOSIS — Z3A15 15 weeks gestation of pregnancy: Secondary | ICD-10-CM

## 2023-10-21 DIAGNOSIS — Z7189 Other specified counseling: Secondary | ICD-10-CM

## 2023-10-21 DIAGNOSIS — I5181 Takotsubo syndrome: Secondary | ICD-10-CM

## 2023-10-21 DIAGNOSIS — F4310 Post-traumatic stress disorder, unspecified: Secondary | ICD-10-CM

## 2023-10-21 NOTE — Progress Notes (Unsigned)
Subjective:  Gabrielle Nguyen is a 28 y.o. [id number] at [redacted]w[redacted]d being seen today for ongoing prenatal care.  She is currently monitored for the following issues for this high-risk pregnancy and has MDD (major depressive disorder); Panic disorder; Asthma; Anxiety with depression; Opioid use disorder; Hyperemesis affecting pregnancy, antepartum; Nausea/vomiting in pregnancy; Prolonged Q-T interval on ECG; Elevated troponin; Acute systolic CHF (congestive heart failure) (HCC); Takotsubo cardiomyopathy; PTSD (post-traumatic stress disorder); Red Chart Rounds Patient; Cervical insufficiency during pregnancy in second trimester, antepartum; Generalized anxiety disorder; Anemia complicating pregnancy, second trimester; Polysubstance abuse (HCC); Substance induced mood disorder (HCC); Withdrawal from benzodiazepine (HCC); Hypokalemia; Supervision of high risk pregnancy, antepartum; and Severe benzodiazepine use disorder (HCC) on their problem list.  Patient reports  generally feeling unwell .  Contractions: Not present. Vag. Bleeding: None.   . Denies leaking of fluid.   The following portions of the patient's history were reviewed and updated as appropriate: allergies, current medications, past family history, past medical history, past social history, past surgical history and problem list. Problem list updated.  Objective:   Vitals:   10/21/23 1650  BP: 107/66  Pulse: (!) 142  Weight: 124 lb 9.6 oz (56.5 kg)    Fetal Status: Fetal Heart Rate (bpm): 150         General:  Alert, oriented and cooperative. Patient is in no acute distress.  Skin: Skin is warm and dry. No rash noted.   Cardiovascular: Normal heart rate noted  Respiratory: Normal respiratory effort, no problems with respiration noted  Abdomen: Soft, gravid, appropriate for gestational age. Pain/Pressure: Absent     Pelvic: Vag. Bleeding: None     Cervical exam deferred        Extremities: Normal range of motion.     Mental Status: Normal  mood and affect. Normal behavior. Normal judgment and thought content.   Urinalysis:      PDMP reviewed during this encounter.   Last UDS: Lab Results  Component Value Date   PD2 FINAL 10/07/2023   CREATIUR 168 10/07/2023     Assessment and Plan:  Pregnancy: [id number] at [redacted]w[redacted]d  1. Supervision of high risk pregnancy, antepartum (Primary) BP and FHR normal Patient reporting she generally does not feel well, like her heart is racing Reports taking a xanax earlier today and maybe some adderall in the past week Placed on pulse ox in room, consistently running 120-140's and occasionally bumping higher into the 150's Given cardiac history with takotsubo cardiomyopathy recommended she go to [facility] asap for more thorough cardiac evaluation (ECG, trops, BNP, +/-echo depending on these results) Other most likely possibility is some sort of side effect from substance use or withdrawal syndrome, but these would be diagnoses of exclusion Will need AFP at next visit along with Panorama  2. Polysubstance abuse (HCC) Last UDS 10/07/23, continues to have multiple non-prescribed medications including amphetamines, benzodiazapenes, and fentanyl At last visit offered to resume suboxone to reduce risk of overdose as I am concerned she is unintentionally ingesting opioids that are being sold to her as benzos She declined at last visit, we did not have a chance to discuss this further at this visit Plan to follow up with her in one week Provided UDS with verbal consent but quantity was grossly inadequate for testing purposes  3. Takotsubo cardiomyopathy Diagnosed at 13 weeks during last pregnancy in 01/2021, initial EF was 30% Has had recovery of EF on last TTE on 07/15/2021, EF 55-60% at that time Repeat TTE ordered and was  scheduled for this and visit with Dr. Clarise Cruz but overslept and did not go to scheduled appt on 10/07/23 Has not yet rescheduled this visit, encouraged her to call the office to reschedule    4. Red Chart Rounds Patient   5. PTSD (post-traumatic stress disorder) Previously referred to psychiatry to re-establish care, referral is still pending  6. Prolonged Q-T interval on ECG ECG at next Cards visit QTC 582 on most recent ECG from 06/02/2023 Avoid QT prolonging meds Stick with phenergan and scopolamine when possible   7. Cervical insufficiency during pregnancy in second trimester, antepartum At one point in last pregnancy had US showing 1.6 cm cervix Follow up shortly after was 3-4 cm in length, possible dynamic cervix Will get cervical length at 16 weeks, this is scheduled for 10/27/2023  8. Proteinuria affecting pregnancy in second trimester Elevated to 0.495 at new OB visit, recheck normal on 10/07/23 at 0.06, resolved from problem list  Preterm labor symptoms and general obstetric precautions including but not limited to vaginal bleeding, contractions, leaking of fluid and fetal movement were reviewed in detail with the patient. Please refer to After Visit Summary for other counseling recommendations.  Return in about 1 week (around 10/28/2023) for REACH clinic, ob visit.   Venora Maples, MD

## 2023-10-22 ENCOUNTER — Encounter: Payer: Self-pay | Admitting: Family Medicine

## 2023-10-27 ENCOUNTER — Ambulatory Visit: Payer: Self-pay

## 2023-10-27 ENCOUNTER — Ambulatory Visit: Payer: Self-pay | Attending: Family Medicine

## 2023-10-27 DIAGNOSIS — O0990 Supervision of high risk pregnancy, unspecified, unspecified trimester: Secondary | ICD-10-CM

## 2023-10-28 ENCOUNTER — Encounter: Payer: Self-pay | Admitting: Family Medicine

## 2023-11-13 ENCOUNTER — Encounter: Payer: Self-pay | Admitting: *Deleted

## 2023-11-18 ENCOUNTER — Ambulatory Visit: Payer: Self-pay

## 2023-11-18 ENCOUNTER — Encounter (HOSPITAL_COMMUNITY): Payer: Self-pay | Admitting: Obstetrics and Gynecology

## 2023-11-18 ENCOUNTER — Inpatient Hospital Stay (HOSPITAL_COMMUNITY)
Admission: AD | Admit: 2023-11-18 | Discharge: 2023-11-24 | DRG: 832 | Disposition: A | Discharge status: Home / Self Care | Payer: MEDICAID | Attending: Family Medicine | Admitting: Family Medicine

## 2023-11-18 ENCOUNTER — Ambulatory Visit (INDEPENDENT_AMBULATORY_CARE_PROVIDER_SITE_OTHER): Payer: Self-pay | Admitting: Family Medicine

## 2023-11-18 ENCOUNTER — Inpatient Hospital Stay (HOSPITAL_COMMUNITY): Payer: MEDICAID

## 2023-11-18 VITALS — BP 93/53 | HR 88

## 2023-11-18 DIAGNOSIS — F4310 Post-traumatic stress disorder, unspecified: Secondary | ICD-10-CM

## 2023-11-18 DIAGNOSIS — E876 Hypokalemia: Secondary | ICD-10-CM | POA: Diagnosis present

## 2023-11-18 DIAGNOSIS — Z7951 Long term (current) use of inhaled steroids: Secondary | ICD-10-CM | POA: Diagnosis not present

## 2023-11-18 DIAGNOSIS — F13239 Sedative, hypnotic or anxiolytic dependence with withdrawal, unspecified: Secondary | ICD-10-CM | POA: Diagnosis present

## 2023-11-18 DIAGNOSIS — E538 Deficiency of other specified B group vitamins: Secondary | ICD-10-CM | POA: Diagnosis present

## 2023-11-18 DIAGNOSIS — O99352 Diseases of the nervous system complicating pregnancy, second trimester: Secondary | ICD-10-CM | POA: Diagnosis present

## 2023-11-18 DIAGNOSIS — Z813 Family history of other psychoactive substance abuse and dependence: Secondary | ICD-10-CM

## 2023-11-18 DIAGNOSIS — F1910 Other psychoactive substance abuse, uncomplicated: Secondary | ICD-10-CM | POA: Diagnosis not present

## 2023-11-18 DIAGNOSIS — I5181 Takotsubo syndrome: Secondary | ICD-10-CM

## 2023-11-18 DIAGNOSIS — Z3A19 19 weeks gestation of pregnancy: Secondary | ICD-10-CM

## 2023-11-18 DIAGNOSIS — O99342 Other mental disorders complicating pregnancy, second trimester: Secondary | ICD-10-CM | POA: Diagnosis present

## 2023-11-18 DIAGNOSIS — F1320 Sedative, hypnotic or anxiolytic dependence, uncomplicated: Secondary | ICD-10-CM

## 2023-11-18 DIAGNOSIS — O99282 Endocrine, nutritional and metabolic diseases complicating pregnancy, second trimester: Secondary | ICD-10-CM | POA: Diagnosis present

## 2023-11-18 DIAGNOSIS — F32A Depression, unspecified: Secondary | ICD-10-CM | POA: Diagnosis present

## 2023-11-18 DIAGNOSIS — O0992 Supervision of high risk pregnancy, unspecified, second trimester: Secondary | ICD-10-CM | POA: Diagnosis not present

## 2023-11-18 DIAGNOSIS — O0990 Supervision of high risk pregnancy, unspecified, unspecified trimester: Secondary | ICD-10-CM

## 2023-11-18 DIAGNOSIS — Z7189 Other specified counseling: Secondary | ICD-10-CM

## 2023-11-18 DIAGNOSIS — Z8249 Family history of ischemic heart disease and other diseases of the circulatory system: Secondary | ICD-10-CM

## 2023-11-18 DIAGNOSIS — O99012 Anemia complicating pregnancy, second trimester: Secondary | ICD-10-CM

## 2023-11-18 DIAGNOSIS — O99412 Diseases of the circulatory system complicating pregnancy, second trimester: Secondary | ICD-10-CM | POA: Diagnosis present

## 2023-11-18 DIAGNOSIS — Z79899 Other long term (current) drug therapy: Secondary | ICD-10-CM | POA: Diagnosis not present

## 2023-11-18 DIAGNOSIS — F419 Anxiety disorder, unspecified: Secondary | ICD-10-CM | POA: Diagnosis present

## 2023-11-18 DIAGNOSIS — O99512 Diseases of the respiratory system complicating pregnancy, second trimester: Secondary | ICD-10-CM | POA: Diagnosis present

## 2023-11-18 DIAGNOSIS — I5032 Chronic diastolic (congestive) heart failure: Secondary | ICD-10-CM | POA: Diagnosis present

## 2023-11-18 DIAGNOSIS — J45909 Unspecified asthma, uncomplicated: Secondary | ICD-10-CM | POA: Diagnosis present

## 2023-11-18 DIAGNOSIS — F1123 Opioid dependence with withdrawal: Secondary | ICD-10-CM | POA: Diagnosis present

## 2023-11-18 DIAGNOSIS — O3432 Maternal care for cervical incompetence, second trimester: Secondary | ICD-10-CM

## 2023-11-18 DIAGNOSIS — Z3A20 20 weeks gestation of pregnancy: Secondary | ICD-10-CM | POA: Diagnosis not present

## 2023-11-18 DIAGNOSIS — O99322 Drug use complicating pregnancy, second trimester: Principal | ICD-10-CM | POA: Diagnosis present

## 2023-11-18 DIAGNOSIS — F1193 Opioid use, unspecified with withdrawal: Secondary | ICD-10-CM | POA: Diagnosis not present

## 2023-11-18 DIAGNOSIS — G4700 Insomnia, unspecified: Secondary | ICD-10-CM | POA: Diagnosis present

## 2023-11-18 DIAGNOSIS — Z818 Family history of other mental and behavioral disorders: Secondary | ICD-10-CM | POA: Diagnosis not present

## 2023-11-18 DIAGNOSIS — R9431 Abnormal electrocardiogram [ECG] [EKG]: Secondary | ICD-10-CM

## 2023-11-18 LAB — CBC
HCT: 29.4 % — ABNORMAL LOW (ref 36.0–46.0)
Hemoglobin: 8.8 g/dL — ABNORMAL LOW (ref 12.0–15.0)
MCH: 21.9 pg — ABNORMAL LOW (ref 26.0–34.0)
MCHC: 29.9 g/dL — ABNORMAL LOW (ref 30.0–36.0)
MCV: 73.1 fL — ABNORMAL LOW (ref 80.0–100.0)
Platelets: 167 10*3/uL (ref 150–400)
RBC: 4.02 MIL/uL (ref 3.87–5.11)
RDW: 17.2 % — ABNORMAL HIGH (ref 11.5–15.5)
WBC: 6.8 10*3/uL (ref 4.0–10.5)
nRBC: 0 % (ref 0.0–0.2)

## 2023-11-18 LAB — TYPE AND SCREEN
ABO/RH(D): A POS
Antibody Screen: NEGATIVE

## 2023-11-18 MED ORDER — BUPRENORPHINE HCL-NALOXONE HCL 8-2 MG SL SUBL
2.0000 | SUBLINGUAL_TABLET | Freq: Once | SUBLINGUAL | Status: AC
  Administered 2023-11-18: 2 via SUBLINGUAL
  Filled 2023-11-18: qty 2

## 2023-11-18 MED ORDER — CLONAZEPAM 0.25 MG PO TBDP
0.2500 mg | ORAL_TABLET | Freq: Once | ORAL | Status: AC
  Administered 2023-11-18: 0.25 mg via ORAL
  Filled 2023-11-18: qty 1

## 2023-11-18 MED ORDER — LACTATED RINGERS IV BOLUS
1000.0000 mL | Freq: Once | INTRAVENOUS | Status: AC
  Administered 2023-11-18: 1000 mL via INTRAVENOUS

## 2023-11-18 MED ORDER — DOCUSATE SODIUM 100 MG PO CAPS
100.0000 mg | ORAL_CAPSULE | Freq: Every day | ORAL | Status: DC
  Administered 2023-11-24: 100 mg via ORAL
  Filled 2023-11-18 (×5): qty 1

## 2023-11-18 MED ORDER — CLONAZEPAM 0.5 MG PO TABS: 0.5000 mg | ORAL_TABLET | Freq: Once | ORAL | Status: DC

## 2023-11-18 MED ORDER — PRENATAL MULTIVITAMIN CH
1.0000 | ORAL_TABLET | Freq: Every day | ORAL | Status: DC
  Filled 2023-11-18 (×3): qty 1

## 2023-11-18 MED ORDER — ACETAMINOPHEN 325 MG PO TABS
650.0000 mg | ORAL_TABLET | ORAL | Status: DC | PRN
  Administered 2023-11-21: 650 mg via ORAL
  Filled 2023-11-18 (×2): qty 2

## 2023-11-18 MED ORDER — LACTATED RINGERS IV SOLN: INTRAVENOUS | Status: AC

## 2023-11-18 MED ORDER — CALCIUM CARBONATE ANTACID 500 MG PO CHEW
2.0000 | CHEWABLE_TABLET | ORAL | Status: DC | PRN
  Administered 2023-11-23: 400 mg via ORAL
  Filled 2023-11-18: qty 2

## 2023-11-18 MED ORDER — SCOPOLAMINE 1 MG/3DAYS TD PT72
1.0000 | MEDICATED_PATCH | TRANSDERMAL | Status: DC
  Administered 2023-11-18 – 2023-11-21 (×2): 1.5 mg via TRANSDERMAL
  Filled 2023-11-18 (×2): qty 1

## 2023-11-18 NOTE — [facility] Note (Signed)
 Gabrielle Nguyen is a 28 y.o. at [redacted]w[redacted]d here in [facility] reporting: going through Withdrawal, Xanax and Fentanyl. Doesn't remember when last took it Hurts everywhere.  Denies bleeding.  Onset of complaint: hrs ago Pain score: 10 Vitals:   11/18/23 1536 11/18/23 1537  BP:  111/68  Pulse:  78  Resp:  16  Temp:  98.1 F (36.7 C)  SpO2: 98% 97%     [id number] to check due to clothing and position. Lab orders placed from triage:  Almost in fetal position in wc.  "I am so cold, please make it stop."
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
## 2023-11-18 NOTE — Progress Notes (Unsigned)
   Subjective:  Gabrielle Nguyen is a 28 y.o. [id number] at [redacted]w[redacted]d being seen today for ongoing prenatal care.  She is currently monitored for the following issues for this high-risk pregnancy and has MDD (major depressive disorder); Panic disorder; Asthma; Anxiety with depression; Opioid use disorder; Hyperemesis affecting pregnancy, antepartum; Nausea/vomiting in pregnancy; Prolonged Q-T interval on ECG; Elevated troponin; Acute systolic CHF (congestive heart failure) (HCC); Takotsubo cardiomyopathy; PTSD (post-traumatic stress disorder); Red Chart Rounds Patient; Cervical insufficiency during pregnancy in second trimester, antepartum; Generalized anxiety disorder; Anemia complicating pregnancy, second trimester; Polysubstance abuse (HCC); Substance induced mood disorder (HCC); Withdrawal from benzodiazepine (HCC); Hypokalemia; Supervision of high risk pregnancy, antepartum; and Severe benzodiazepine use disorder (HCC) on their problem list.  Patient reports  currently withdrawing .  Contractions: Not present. Vag. Bleeding: None.   . Denies leaking of fluid.   The following portions of the patient's history were reviewed and updated as appropriate: allergies, current medications, past family history, past medical history, past social history, past surgical history and problem list. Problem list updated.  Objective:   Vitals:   11/18/23 1434  BP: (!) 93/53  Pulse: 88  SpO2: 97%    Fetal Status: Fetal Heart Rate (bpm): 166         General:  Alert, oriented and cooperative. Patient is in no acute distress.  Skin: Skin is warm and dry. No rash noted.   Cardiovascular: Normal heart rate noted  Respiratory: Normal respiratory effort, no problems with respiration noted  Abdomen: Soft, gravid, appropriate for gestational age. Pain/Pressure: Present (generalized body aches)     Pelvic: Vag. Bleeding: None     Cervical exam deferred        Extremities: Normal range of motion.     Mental Status: Normal  mood and affect. Normal behavior. Normal judgment and thought content.   Urinalysis:      PDMP not reviewed this encounter.***  Last UDS: Lab Results  Component Value Date   CREATIUR 168 10/07/2023     Assessment and Plan:  Pregnancy: [id number] at [redacted]w[redacted]d  1. Supervision of high risk pregnancy, antepartum (Primary) ***  2. Polysubstance abuse (HCC) ***  3. Takotsubo cardiomyopathy ***  4. Red Chart Rounds Patient ***  5. PTSD (post-traumatic stress disorder) ***  6. Prolonged Q-T interval on ECG ***  7. Cervical insufficiency during pregnancy in second trimester, antepartum ***  {Blank single:19197::"Term","Preterm"} labor symptoms and general obstetric precautions including but not limited to vaginal bleeding, contractions, leaking of fluid and fetal movement were reviewed in detail with the patient. Please refer to After Visit Summary for other counseling recommendations.  No follow-ups on file.   Venora Maples, MD

## 2023-11-18 NOTE — Progress Notes (Addendum)
 Attempted to meet with Gabrielle Nguyen in exam room.  Initially she did not verbally respond to questions but was then with limited responses. Dr. Crissie Reese arrived at that time to assume care.  Will follow with [name] at her future REACH visits.
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
## 2023-11-19 ENCOUNTER — Inpatient Hospital Stay (HOSPITAL_BASED_OUTPATIENT_CLINIC_OR_DEPARTMENT_OTHER): Payer: Self-pay

## 2023-11-19 ENCOUNTER — Encounter (HOSPITAL_COMMUNITY): Payer: Self-pay | Admitting: Family Medicine

## 2023-11-19 ENCOUNTER — Encounter: Payer: Self-pay | Admitting: Family Medicine

## 2023-11-19 ENCOUNTER — Inpatient Hospital Stay (HOSPITAL_COMMUNITY): Payer: Self-pay

## 2023-11-19 ENCOUNTER — Other Ambulatory Visit (HOSPITAL_COMMUNITY): Payer: Self-pay

## 2023-11-19 ENCOUNTER — Other Ambulatory Visit: Payer: Self-pay

## 2023-11-19 DIAGNOSIS — O0932 Supervision of pregnancy with insufficient antenatal care, second trimester: Secondary | ICD-10-CM

## 2023-11-19 DIAGNOSIS — F1190 Opioid use, unspecified, uncomplicated: Secondary | ICD-10-CM

## 2023-11-19 DIAGNOSIS — O99322 Drug use complicating pregnancy, second trimester: Secondary | ICD-10-CM

## 2023-11-19 DIAGNOSIS — Z3A19 19 weeks gestation of pregnancy: Secondary | ICD-10-CM

## 2023-11-19 DIAGNOSIS — I428 Other cardiomyopathies: Secondary | ICD-10-CM

## 2023-11-19 LAB — ECHOCARDIOGRAM COMPLETE
AR max vel: 2.38 cm2
AV Peak grad: 10.6 mmHg
Ao pk vel: 1.63 m/s
Area-P 1/2: 3.48 cm2
Height: 66 in
MV M vel: 4.23 m/s
MV Peak grad: 71.6 mmHg
S' Lateral: 3.2 cm
Weight: 1979.2 [oz_av]

## 2023-11-19 LAB — BASIC METABOLIC PANEL
Anion gap: 11 (ref 5–15)
BUN: 6 mg/dL (ref 6–20)
CO2: 18 mmol/L — ABNORMAL LOW (ref 22–32)
Calcium: 8.9 mg/dL (ref 8.9–10.3)
Chloride: 104 mmol/L (ref 98–111)
Creatinine, Ser: 0.42 mg/dL — ABNORMAL LOW (ref 0.44–1.00)
GFR, Estimated: >60 mL/min (ref >60)
Glucose, Bld: 95 mg/dL (ref 70–99)
Potassium: 3 mmol/L — ABNORMAL LOW (ref 3.5–5.1)
Sodium: 133 mmol/L — ABNORMAL LOW (ref 135–145)

## 2023-11-19 LAB — PROTEIN / CREATININE RATIO, URINE
Creatinine, Urine: 140 mg/dL
Protein Creatinine Ratio: 0.09 mg/mg{creat} (ref 0.00–0.15)
Total Protein, Urine: 13 mg/dL

## 2023-11-19 LAB — CBC
HCT: 27.9 % — ABNORMAL LOW (ref 36.0–46.0)
Hemoglobin: 8.6 g/dL — ABNORMAL LOW (ref 12.0–15.0)
MCH: 22.4 pg — ABNORMAL LOW (ref 26.0–34.0)
MCHC: 30.8 g/dL (ref 30.0–36.0)
MCV: 72.7 fL — ABNORMAL LOW (ref 80.0–100.0)
Platelets: 189 10*3/uL (ref 150–400)
RBC: 3.84 MIL/uL — ABNORMAL LOW (ref 3.87–5.11)
RDW: 17.3 % — ABNORMAL HIGH (ref 11.5–15.5)
WBC: 7.6 10*3/uL (ref 4.0–10.5)
nRBC: 0 % (ref 0.0–0.2)

## 2023-11-19 MED ORDER — BUPRENORPHINE HCL-NALOXONE HCL 2-0.5 MG SL SUBL: 2.0000 | SUBLINGUAL_TABLET | Freq: Two times a day (BID) | SUBLINGUAL | Status: DC | PRN

## 2023-11-19 MED ORDER — ALBUTEROL SULFATE (2.5 MG/3ML) 0.083% IN NEBU: 2.5000 mg | INHALATION_SOLUTION | Freq: Once | RESPIRATORY_TRACT | Status: DC | PRN

## 2023-11-19 MED ORDER — EPINEPHRINE 0.3 MG/0.3ML IJ SOAJ: 0.3000 mg | Freq: Once | INTRAMUSCULAR | Status: DC | PRN

## 2023-11-19 MED ORDER — BUPRENORPHINE HCL-NALOXONE HCL 8-2 MG SL SUBL
1.0000 | SUBLINGUAL_TABLET | Freq: Two times a day (BID) | SUBLINGUAL | Status: DC
  Administered 2023-11-19: 1 via SUBLINGUAL
  Filled 2023-11-19 (×2): qty 1

## 2023-11-19 MED ORDER — BUPRENORPHINE HCL-NALOXONE HCL 8-2 MG SL SUBL
1.0000 | SUBLINGUAL_TABLET | Freq: Three times a day (TID) | SUBLINGUAL | 0 refills | Status: DC
  Filled 2023-11-19: qty 88, 30d supply, fill #0
  Filled 2023-11-24: qty 90, 30d supply, fill #0

## 2023-11-19 MED ORDER — SODIUM CHLORIDE 0.9 % IV SOLN
500.0000 mg | Freq: Once | INTRAVENOUS | Status: AC
  Administered 2023-11-19: 500 mg via INTRAVENOUS
  Filled 2023-11-19: qty 25

## 2023-11-19 MED ORDER — SODIUM CHLORIDE 0.9 % IV SOLN: INTRAVENOUS | Status: AC | PRN

## 2023-11-19 MED ORDER — PROCHLORPERAZINE MALEATE 10 MG PO TABS
10.0000 mg | ORAL_TABLET | Freq: Four times a day (QID) | ORAL | 3 refills | Status: DC | PRN
  Filled 2023-11-19 – 2023-11-24 (×2): qty 30, 8d supply, fill #0
  Filled 2023-12-14: qty 30, 8d supply, fill #1

## 2023-11-19 MED ORDER — SODIUM CHLORIDE 0.9 % IV BOLUS
500.0000 mL | Freq: Once | INTRAVENOUS | Status: AC | PRN
  Administered 2023-11-19: 500 mL via INTRAVENOUS

## 2023-11-19 MED ORDER — SODIUM CHLORIDE 0.9 % IV SOLN
8.0000 mg | Freq: Four times a day (QID) | INTRAVENOUS | Status: DC | PRN
  Administered 2023-11-19 – 2023-11-20 (×5): 8 mg via INTRAVENOUS
  Filled 2023-11-19 (×5): qty 4

## 2023-11-19 MED ORDER — METHYLPREDNISOLONE SODIUM SUCC 125 MG IJ SOLR: 125.0000 mg | Freq: Once | INTRAMUSCULAR | Status: DC | PRN

## 2023-11-19 MED ORDER — DIPHENHYDRAMINE HCL 50 MG/ML IJ SOLN: 25.0000 mg | Freq: Once | INTRAMUSCULAR | Status: DC | PRN

## 2023-11-19 MED ORDER — NALOXONE HCL 4 MG/0.1ML NA LIQD
NASAL | 11 refills | Status: DC
  Filled 2023-11-19 – 2023-11-24 (×2): qty 2, 2d supply, fill #0

## 2023-11-19 MED ORDER — BUPRENORPHINE HCL-NALOXONE HCL 8-2 MG SL SUBL
1.0000 | SUBLINGUAL_TABLET | Freq: Three times a day (TID) | SUBLINGUAL | Status: DC
  Administered 2023-11-19 – 2023-11-24 (×16): 1 via SUBLINGUAL
  Filled 2023-11-19 (×17): qty 1

## 2023-11-19 MED ORDER — PROCHLORPERAZINE MALEATE 10 MG PO TABS
10.0000 mg | ORAL_TABLET | Freq: Once | ORAL | Status: DC
  Filled 2023-11-19 (×2): qty 1

## 2023-11-19 NOTE — Plan of Care (Signed)
  Problem: Education: Goal: Knowledge of disease or condition will improve Outcome: Adequate for Discharge Goal: Knowledge of the prescribed therapeutic regimen will improve Outcome: Adequate for Discharge Goal: Individualized Educational Video(s) Outcome: Adequate for Discharge   Problem: Clinical Measurements: Goal: Complications related to the disease process, condition or treatment will be avoided or minimized Outcome: Adequate for Discharge   Problem: Education: Goal: Knowledge of General Education information will improve Description: Including pain rating scale, medication(s)/side effects and non-pharmacologic comfort measures Outcome: Adequate for Discharge   Problem: Health Behavior/Discharge Planning: Goal: Ability to manage health-related needs will improve Outcome: Adequate for Discharge   Problem: Clinical Measurements: Goal: Ability to maintain clinical measurements within normal limits will improve Outcome: Adequate for Discharge Goal: Will remain free from infection Outcome: Adequate for Discharge Goal: Diagnostic test results will improve Outcome: Adequate for Discharge Goal: Respiratory complications will improve Outcome: Adequate for Discharge Goal: Cardiovascular complication will be avoided Outcome: Adequate for Discharge   Problem: Activity: Goal: Risk for activity intolerance will decrease Outcome: Adequate for Discharge   Problem: Nutrition: Goal: Adequate nutrition will be maintained Outcome: Adequate for Discharge   Problem: Coping: Goal: Level of anxiety will decrease Outcome: Adequate for Discharge   Problem: Elimination: Goal: Will not experience complications related to bowel motility Outcome: Adequate for Discharge Goal: Will not experience complications related to urinary retention Outcome: Adequate for Discharge   Problem: Pain Managment: Goal: General experience of comfort will improve and/or be controlled Outcome: Adequate for  Discharge   Problem: Safety: Goal: Ability to remain free from injury will improve Outcome: Adequate for Discharge   Problem: Skin Integrity: Goal: Risk for impaired skin integrity will decrease Outcome: Adequate for Discharge

## 2023-11-19 NOTE — TOC Initial Note (Signed)
 Transition of Care [facility]) - Initial/Assessment Note    Patient Details  Name: Gabrielle Nguyen MRN: [id number] Date of Birth: Jun 14, 1996  [facility] CM/SW Contact:    Geoffery Lyons, RN Phone [phone] 11/19/2023, 3:40 PM  Clinical Narrative:                 HPI Gabrielle Nguyen is a [id number] at [redacted]w[redacted]d presenting with acute opiate withdrawal to MAU . CM received message from [facility] pharmacy that patient has medication need. CM called financial counselor to follow up with patient. Patient has no insurance and CM filled out [facility] program with the  [facility] department to cover medications through the [facility] pharmacy here at [facility] for discharge.  Patient has PCP.     Activities of Daily Living   ADL Screening (condition at time of admission) Independently performs ADLs?: Yes (appropriate for developmental age) Is the patient deaf or have difficulty hearing?: No Does the patient have difficulty seeing, even when wearing glasses/contacts?: No Does the patient have difficulty concentrating, remembering, or making decisions?: No   Admission diagnosis:  Opiate withdrawal (HCC) [F11.93] Patient Active Problem List   Diagnosis Date Noted   Opiate withdrawal (HCC) 11/18/2023   Severe benzodiazepine use disorder (HCC) 09/26/2023   Supervision of high risk pregnancy, antepartum 09/25/2023   Hypokalemia 06/04/2023   Polysubstance abuse (HCC) 06/03/2023   Substance induced mood disorder (HCC) 06/03/2023   Withdrawal from benzodiazepine (HCC) 06/03/2023   Anemia complicating pregnancy, second trimester 06/26/2021   Generalized anxiety disorder 05/16/2021   Cervical insufficiency during pregnancy in second trimester, antepartum 04/27/2021   Red Chart Rounds Patient 04/20/2021   Prolonged Q-T interval on ECG 02/06/2021   Elevated troponin 02/06/2021   Acute systolic CHF (congestive heart failure) (HCC)    Takotsubo cardiomyopathy    Nausea/vomiting in pregnancy 02/05/2021    Hyperemesis affecting pregnancy, antepartum 02/02/2021   Opioid use disorder 12/30/2020   Asthma    Anxiety with depression    PTSD (post-traumatic stress disorder) 01/07/2018   MDD (major depressive disorder) 08/19/2011   Panic disorder 08/19/2011   PCP:  Venora Maples, MD Pharmacy:   [id number] [place],  - [address] [place] [address] Phone: [phone] Fax: [phone]  [facility] [address] [place] [address] Phone: [phone] Fax: [phone]     Social Drivers of Health (SDOH) Social History: SDOH Screenings   Food Insecurity: Patient Declined (06/05/2023)  Housing: High Risk (06/05/2023)  Transportation Needs: Patient Declined (06/05/2023)  Utilities: Patient Declined (06/05/2023)  Depression (PHQ2-9): High Risk (06/03/2023)  Financial Resource Strain: Medium Risk (02/07/2021)  Tobacco Use: Low Risk  (11/19/2023)      06/06/2023   12:55 PM  Readmission Risk Prevention Plan  Transportation Screening Complete  PCP or Specialist Appt within 3-5 Days Complete  HRI or Home Care Consult Complete  Social Work Consult for Recovery Care Planning/Counseling Complete  Palliative Care Screening Not Applicable  Medication Review Ocean[employer]) Complete

## 2023-11-19 NOTE — Progress Notes (Signed)
 FACULTY PRACTICE ANTEPARTUM COMPREHENSIVE PROGRESS NOTE  Gabrielle Nguyen is a 28 y.o. [id number] at [redacted]w[redacted]d who is admitted for suboxone induction in the setting of withdrawal.  Estimated Date of Delivery: 04/08/24 Fetal presentation is unsure.  Length of Stay:  1 Days. Admitted 11/18/2023  Subjective: Patient feeling better than yesterday but still nauseous. Took a total of 24 mg of suboxone yesterday.   Vitals:  Blood pressure (!) 95/46, pulse 71, temperature 98.1 F (36.7 C), temperature source Oral, resp. rate 18, height 5\' 6"  (1.676 m), weight 56.1 kg, SpO2 97%, unknown if currently breastfeeding.  Physical Exam Constitutional:      General: She is not in acute distress.    Appearance: She is not toxic-appearing or diaphoretic.     Comments: Pale, looks mildly ill but no acute distress. No acute signs of withdrawal  HENT:     Head: Normocephalic and atraumatic.     Mouth/Throat:     Mouth: Mucous membranes are moist.  Eyes:     General: No scleral icterus. Pulmonary:     Effort: Pulmonary effort is normal. No respiratory distress.  Skin:    General: Skin is warm and dry.      Fetal monitoring: n/a  Results for orders placed or performed during the hospital encounter of 11/18/23 (from the past 48 hours)  CBC     Status: Abnormal   Collection Time: 11/18/23 10:37 PM  Result Value Ref Range   WBC 6.8 4.0 - 10.5 K/uL   RBC 4.02 3.87 - 5.11 MIL/uL   Hemoglobin 8.8 (L) 12.0 - 15.0 g/dL    Comment: Reticulocyte Hemoglobin testing may be clinically indicated, consider ordering this additional test [id number]    HCT 29.4 (L) 36.0 - 46.0 %   MCV 73.1 (L) 80.0 - 100.0 fL   MCH 21.9 (L) 26.0 - 34.0 pg   MCHC 29.9 (L) 30.0 - 36.0 g/dL   RDW 46.9 (H) 62.9 - 52.8 %   Platelets 167 150 - 400 K/uL   nRBC 0.0 0.0 - 0.2 %    Comment: Performed at [facility], [address], [place], [place] [address]  Type and screen [facility]     Status: None   Collection  Time: 11/18/23 10:37 PM  Result Value Ref Range   ABO/RH(D) A POS    Antibody Screen NEG    Sample Expiration      11/21/2023,2359 Performed at [facility], [address], [place], [place] [address]   Basic metabolic panel     Status: Abnormal   Collection Time: 11/18/23 10:37 PM  Result Value Ref Range   Sodium 133 (L) 135 - 145 mmol/L   Potassium 3.0 (L) 3.5 - 5.1 mmol/L   Chloride 104 98 - 111 mmol/L   CO2 18 (L) 22 - 32 mmol/L   Glucose, Bld 95 70 - 99 mg/dL    Comment: Glucose reference range applies only to samples taken after fasting for at least 8 hours.   BUN 6 6 - 20 mg/dL   Creatinine, Ser 7.25 (L) 0.44 - 1.00 mg/dL   Calcium 8.9 8.9 - 36.6 mg/dL   GFR, Estimated >44 >03 mL/min    Comment: (NOTE) Calculated using the CKD-EPI Creatinine Equation (2021)    Anion gap 11 5 - 15    Comment: Performed at [facility], [address], [place], [place] [address]  CBC     Status: Abnormal   Collection Time:  11/19/23  9:08 AM  Result Value Ref Range   WBC 7.6 4.0 - 10.5 K/uL   RBC 3.84 (L) 3.87 - 5.11 MIL/uL   Hemoglobin 8.6 (L) 12.0 - 15.0 g/dL    Comment: Reticulocyte Hemoglobin testing may be clinically indicated, consider ordering this additional test [id number]    HCT 27.9 (L) 36.0 - 46.0 %   MCV 72.7 (L) 80.0 - 100.0 fL   MCH 22.4 (L) 26.0 - 34.0 pg   MCHC 30.8 30.0 - 36.0 g/dL   RDW 46.9 (H) 62.9 - 52.8 %   Platelets 189 150 - 400 K/uL   nRBC 0.0 0.0 - 0.2 %    Comment: Performed at [facility], [address], [place], [place] [address]    DG Chest Port 1 View Result Date: 11/18/2023 CLINICAL DATA:  Cough. EXAM: PORTABLE CHEST 1 VIEW COMPARISON:  06/02/2023 FINDINGS: Central peribronchial thickening noted bilaterally. No evidence of pulmonary airspace disease or hyperinflation. No evidence of pleural effusion. Heart size is normal. IMPRESSION: Central peribronchial thickening. No evidence of pneumonia. Electronically Signed   By: Danae Orleans M.D.   On: 11/18/2023 18:03    Current scheduled medications  buprenorphine-naloxone  1 tablet Sublingual TID   docusate sodium  100 mg Oral Daily   prenatal multivitamin  1 tablet Oral Q1200   scopolamine  1 patch Transdermal Q72H    I have reviewed the patient's current medications.  ASSESSMENT: Principal Problem:   Opiate withdrawal (HCC) Active Problems:   Prolonged Q-T interval on ECG   Takotsubo cardiomyopathy   Cervical insufficiency during pregnancy in second trimester, antepartum   Anemia complicating pregnancy, second trimester   Supervision of high risk pregnancy, antepartum   Severe benzodiazepine use disorder (HCC)   PLAN: #OUD Doing much better this morning than when I saw her in clinic yesterday, no long in acute withdrawal. Continue suboxone 8 TID. Collect ToxAssure flex 15 when able to. Continue COWS scoring.   #Hx of Takotsubo cardiomyopathy Has missed appointments for cardiology. Will get TTE while here and touch base with Dr. Milas Kocher about whether she needs to be seen while here.   #Supervision of high risk pregnancy #Hx of cervical insufficiency Has not yet had anatomy scan, will do while here. Had possible cervical insufficincy vs dynamic cervix in last pregnancy, may need TVUS depending on abdominal views.   #Anemia Ordered for IV iron outpatient but not yet done, will give dose of 500mg  Venofer while here.  #Benzodiazapene use disorder On prior chart review despite patient reports of withdrawal have not found any admissions or confirmed episodes. Received one low dose of klonopin last night. Defer further benzo's for now as I have low suspicion for withdrawal at this time. Continue CIWA scoring.   #Long QT Syndrome Qtc 470 on admission. Struggling with nausea, OK for one time dose of zofran but will avoid recurrent doses. Trial compazine if needs additional agent.    Dispo: hopefully later today pending above workup    Venora Maples,  MD/MPH Attending Family Medicine Physician, [facility] for [facility]

## 2023-11-19 NOTE — Progress Notes (Signed)
 Echocardiogram 2D Echocardiogram has been performed.  Gabrielle Nguyen 11/19/2023, 12:18 PM

## 2023-11-19 NOTE — Discharge Summary (Signed)
 Antenatal Physician Discharge Summary  Patient ID: Gabrielle Nguyen MRN: [id number] DOB/AGE: 1996/06/19 27 y.o.  Admit date: 11/18/2023 Discharge date: 11/24/2023  Admission Diagnoses:  Discharge Diagnoses:  [redacted]w[redacted]d  OUD Prenatal Procedures: ultrasound  Consults: Neonatology, Maternal Fetal Medicine  Hospital Course:  Gabrielle Nguyen is a 28 y.o. [phone] with IUP at [redacted]w[redacted]d admitted for management of opioid withdrawal.    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  Discharge Exam: Temp:  [97.9 F (36.6 C)-98.5 F (36.9 C)] 98.5 F (36.9 C) (03/24 0853) Pulse Rate:  [96-128] 128 (03/24 0853) Resp:  [16-17] 16 (03/24 0853) BP: (106-120)/(61-77) 106/62 (03/24 0853) SpO2:  [95 %-99 %] 96 % (03/24 0400) Weight:  [53.4 kg] 53.4 kg (03/24 0444) Physical Exam Thin WF NAD   Significant Diagnostic Studies:  Results for orders placed or performed during the hospital encounter of 11/18/23 (from the past week)  GC/Chlamydia probe amp ([place])not at [facility]   Collection Time: 11/18/23  3:28 PM  Result Value Ref Range   Neisseria Gonorrhea Negative    Chlamydia Negative    Comment Normal Reference Ranger Chlamydia - Negative    Comment      Normal Reference Range Neisseria Gonorrhea - Negative  CBC   Collection Time: 11/18/23 10:37 PM  Result Value Ref Range   WBC 6.8 4.0 - 10.5 K/uL   RBC 4.02 3.87 - 5.11 MIL/uL   Hemoglobin 8.8 (L) 12.0 - 15.0 g/dL   HCT 01.0 (L) 27.2 - 53.6 %   MCV 73.1 (L) 80.0 - 100.0 fL   MCH 21.9 (L) 26.0 - 34.0 pg   MCHC 29.9 (L) 30.0 - 36.0 g/dL   RDW 64.4 (H) 03.4 - 74.2 %   Platelets 167 150 - 400 K/uL   nRBC 0.0 0.0 - 0.2 %  Basic metabolic panel   Collection Time: 11/18/23 10:37 PM  Result Value Ref Range   Sodium 133 (L) 135 - 145 mmol/L   Potassium 3.0 (L)  3.5 - 5.1 mmol/L   Chloride 104 98 - 111 mmol/L   CO2 18 (L) 22 - 32 mmol/L   Glucose, Bld 95 70 - 99 mg/dL   BUN 6 6 - 20 mg/dL   Creatinine, Ser 5.95 (L) 0.44 - 1.00 mg/dL   Calcium 8.9 8.9 - 63.8 mg/dL   GFR, Estimated >75 >64 mL/min   Anion gap 11 5 - 15  Type and screen [facility]   Collection Time: 11/18/23 10:37 PM  Result Value Ref Range   ABO/RH(D) A POS    Antibody Screen NEG    Sample Expiration      11/21/2023,2359 Performed at [facility], [address] [place], [place] [address]   CBC   Collection Time: 11/19/23  9:08 AM  Result Value Ref Range   WBC 7.6 4.0 - 10.5 K/uL   RBC 3.84 (L) 3.87 - 5.11 MIL/uL   Hemoglobin 8.6 (L) 12.0 - 15.0 g/dL   HCT 18.8 (L) 41.6 - 60.6 %   MCV 72.7 (L) 80.0 - 100.0 fL   MCH 22.4 (L) 26.0 - 34.0 pg   MCHC 30.8 30.0 - 36.0 g/dL   RDW 30.1 (H) 60.1 - 09.3 %   Platelets 189 150 - 400 K/uL   nRBC 0.0 0.0 - 0.2 %  ECHOCARDIOGRAM COMPLETE   Collection Time: 11/19/23 12:08 PM  Result Value Ref Range   Weight 1,979.2 oz   Height 66 in   BP 108/62 mmHg   S' Lateral 3.20 cm   AR max vel 2.38 cm2   AV Peak grad 10.6 mmHg   Ao pk vel 1.63 m/s   Area-P 1/2 3.48 cm2   MV M vel 4.23 m/s   MV Peak grad 71.6 mmHg   Est EF 65 - 70%   Protein / creatinine ratio, urine   Collection Time: 11/19/23  4:30 PM  Result Value Ref Range   Creatinine, Urine 140 mg/dL   Total Protein, Urine 13 mg/dL   Protein Creatinine Ratio 0.09 0.00 - 0.15 mg/mg[Cre]  Miscellaneous [facility] test (send-out)   Collection Time: 11/19/23  4:30 PM  Result Value Ref Range   [facility] test code [id number]    [facility] test name [id number]    Misc [facility] result COMMENT   Vitamin B12   Collection Time: 11/20/23  6:58 PM  Result Value Ref Range   Vitamin B-12 157 (L) 180 - 914 pg/mL  Folate   Collection Time: 11/20/23  6:58 PM  Result Value Ref Range   Folate 12.0 >5.9 ng/mL  Iron and TIBC   Collection Time: 11/21/23  1:38 PM  Result Value Ref  Range   Iron 341 (H) 28 - 170 ug/dL   TIBC 657 846 - 962 ug/dL   Saturation Ratios 83 (H) 10.4 - 31.8 %   UIBC 68 ug/dL  Ferritin   Collection Time: 11/21/23  1:38 PM  Result Value Ref Range   Ferritin 230 11 - 307 ng/mL  Comprehensive metabolic panel   Collection Time: 11/22/23  2:10 PM  Result Value Ref Range   Sodium 132 (L) 135 - 145 mmol/L   Potassium 3.0 (L) 3.5 - 5.1 mmol/L   Chloride 103 98 - 111 mmol/L   CO2 20 (L) 22 - 32 mmol/L   Glucose, Bld 114 (H) 70 - 99 mg/dL   BUN 6 6 - 20 mg/dL   Creatinine, Ser 9.52 0.44 - 1.00 mg/dL   Calcium 8.3 (L) 8.9 - 10.3 mg/dL   Total Protein 5.7 (L) 6.5 - 8.1 g/dL   Albumin 2.8 (L) 3.5 - 5.0 g/dL   AST 16 15 - 41 U/L   ALT 8 0 - 44 U/L   Alkaline Phosphatase 40 38 - 126 U/L   Total Bilirubin 0.3 0.0 - 1.2 mg/dL   GFR, Estimated >84 >13 mL/min   Anion gap 9 5 - 15   [place] MFM OB Detail + 14 Weeks Result Date: 11/19/2023 ----------------------------------------------------------------------  OBSTETRICS REPORT                       (Signed Final 11/19/2023 06:03 pm) ---------------------------------------------------------------------- Patient Info  ID #:       [id number]                          D.O.B.:  [birth year] (27 yrs)(F)  Name:       Gabrielle Nguyen                   Visit Date: 11/19/2023 12:20 pm ---------------------------------------------------------------------- Performed By  Attending:        Lin Landsman      Referred By:      [facility]  MD                                       Care  Performed By:     Percell Boston          [facility] ---------------------------------------------------------------------- Orders  #  Description                           Code        Ordered By  1  [place] MFM OB DETAIL +14 WK               76811.01    MATTHEW ECKSTAT ----------------------------------------------------------------------   #  Order #                     Accession #                Episode #  1  [id number]                   [id number]                 [id number] ---------------------------------------------------------------------- Indications  [redacted] weeks gestation of pregnancy                Z3A.19  Encounter for antenatal screening,             Z36.9  unspecified  Drug use complicating pregnancy, second        O99.322  trimester  Medical complication of pregnancy (hx of       O26.90  takotsubo cardiomyopathy)  Insufficient Prenatal Care                     O09.30 ---------------------------------------------------------------------- Fetal Evaluation  Num Of Fetuses:         1  Fetal Heart Rate(bpm):  171  Cardiac Activity:       Observed  Presentation:           Transverse, head to maternal left  Placenta:               Posterior  P. Cord Insertion:      Visualized, central  Amniotic Fluid  AFI FV:      Within normal limits                              Largest Pocket(cm)                              3.5 ---------------------------------------------------------------------- Biometry  BPD:      47.2  mm     G. Age:  20w 2d         68  %    CI:        76.87   %    70 - 86  FL/HC:      20.0   %    16.8 - 19.8  HC:      170.5  mm     G. Age:  19w 5d         33  %    HC/AC:      1.11        1.09 - 1.39  AC:      153.5  mm     G. Age:  20w 4d         68  %    FL/BPD:     72.2   %  FL:       34.1  mm     G. Age:  20w 5d         73  %    FL/AC:      22.2   %    20 - 24  HUM:      30.8  mm     G. Age:  20w 2d         62  %  CER:      20.8  mm     G. Age:  19w 6d         69  %  NFT:       2.5  mm  Est. FW:     359  gm    0 lb 13 oz      82  % ---------------------------------------------------------------------- OB History  Gravidity:    5         Term:   3        Prem:   0        SAB:   1  TOP:          0       Ectopic:  0        Living: 3  ---------------------------------------------------------------------- Gestational Age  U/S Today:     20w 2d                                        EDD:   04/05/24  Best:          19w 6d     Det. By:  Marcella Dubs         EDD:   04/08/24                                      (08/22/23) ---------------------------------------------------------------------- Anatomy  Cranium:               Appears normal         LVOT:                   Appears normal  Cavum:                 Appears normal         Aortic Arch:            Appears normal  Ventricles:            Appears normal         Ductal Arch:            Not well visualized  Choroid Plexus:        Appears normal  Diaphragm:              Appears normal  Cerebellum:            Appears normal         Stomach:                Appears normal, left                                                                        sided  Posterior Fossa:       Appears normal         Abdomen:                Appears normal  Nuchal Fold:           Appears normal         Abdominal Wall:         Appears nml (cord                                                                        insert, abd wall)  Face:                  Appears normal         Cord Vessels:           Appears normal (3                         (orbits and profile)                           vessel cord)  Lips:                  Not well visualized    Kidneys:                Appear normal  Palate:                Appears normal         Bladder:                Appears normal  Thoracic:              Appears normal         Spine:                  Appears normal  Heart:                 Not well visualized    Upper Extremities:      Appears normal  RVOT:                  Appears normal         Lower Extremities:      Appears normal  Other:  Rt heel visualized. Lt hand visualized. Fetus appears to be female.  Technically difficult due to fetal position.  ---------------------------------------------------------------------- Cervix Uterus Adnexa  Cervix  Length:            4.1  cm.  Normal appearance by transabdominal scan  Uterus  No abnormality visualized.  Right Ovary  Not visualized.  Left Ovary  Not visualized.  Cul De Sac  No free fluid seen.  Adnexa  No abnormality visualized ---------------------------------------------------------------------- Impression  Single intrauterine pregnancy here for a detailed anatomy  due to drug use in pregnancy.  Normal anatomy with measurements consistent with dates  There is good fetal movement and amniotic fluid volume  Suboptimal views of the fetal anatomy were obtained  secondary to fetal position. ---------------------------------------------------------------------- Recommendations  Follow up growth in 4 weeks. ----------------------------------------------------------------------              Lin Landsman, MD Electronically Signed Final Report   11/19/2023 06:03 pm ----------------------------------------------------------------------   ECHOCARDIOGRAM COMPLETE Result Date: 11/19/2023    ECHOCARDIOGRAM REPORT   Patient Name:   [facility] Date of Exam: 11/19/2023 Medical Rec #:  [id number]     Height:       66.0 in Accession #:    [id number]    Weight:       123.7 lb Date of Birth:  03/29/1996    BSA:          1.630 m Patient Age:    27 years      BP:           101/64 mmHg Patient Gender: F             HR:           65 bpm. Exam Location:  Inpatient Procedure: 2D Echo, 3D Echo, Cardiac Doppler and Color Doppler (Both Spectral            and Color Flow Doppler were utilized during procedure). 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 CHF. Takotsubo Cardiomyopathy.  Sonographer:    Lucendia Herrlich RCS Referring Phys: [id number] MATTHEW M ECKSTAT IMPRESSIONS  1. Left ventricular ejection fraction,  by estimation, is 65 to 70%. Left ventricular ejection fraction by 3D volume is 68 %. The left ventricle has normal function. The left ventricle has no regional wall motion abnormalities. Left ventricular diastolic  parameters were normal.  2. Right ventricular systolic function is normal. The right ventricular size is normal. There is normal pulmonary artery systolic pressure.  3. Left atrial size was moderately dilated.  4. The mitral valve is normal in structure. Mild mitral valve regurgitation. No evidence of mitral stenosis.  5. The aortic valve is tricuspid. Aortic valve regurgitation is not visualized. No aortic stenosis is present.  6. The inferior vena cava is normal in size with greater than 50% respiratory variability, suggesting right atrial pressure of 3 mmHg. FINDINGS  Left Ventricle: Left ventricular ejection fraction, by estimation, is 65 to 70%. Left ventricular ejection fraction by 3D volume is 68 %. The left ventricle has normal function. The left ventricle has no regional wall motion abnormalities. The left ventricular internal cavity size was normal in size. There is no left ventricular hypertrophy. Left ventricular diastolic parameters were normal. Normal left ventricular filling pressure. Right Ventricle: The right ventricular size is normal. No increase in right ventricular wall thickness. Right ventricular systolic function  is normal. There is normal pulmonary artery systolic pressure. The tricuspid regurgitant velocity is 1.42 m/s, and  with an assumed right atrial pressure of 3 mmHg, the estimated right ventricular systolic pressure is 11.1 mmHg. Left Atrium: Left atrial size was moderately dilated. Right Atrium: Right atrial size was normal in size. Pericardium: There is no evidence of pericardial effusion. Mitral Valve: The mitral valve is normal in structure. Mild mitral valve regurgitation. No evidence of mitral valve stenosis. Tricuspid Valve: The tricuspid valve is normal in  structure. Tricuspid valve regurgitation is trivial. No evidence of tricuspid stenosis. Aortic Valve: The aortic valve is tricuspid. Aortic valve regurgitation is not visualized. No aortic stenosis is present. Aortic valve peak gradient measures 10.6 mmHg. Pulmonic Valve: The pulmonic valve was normal in structure. Pulmonic valve regurgitation is trivial. No evidence of pulmonic stenosis. Aorta: The aortic root is normal in size and structure. Venous: The inferior vena cava is normal in size with greater than 50% respiratory variability, suggesting right atrial pressure of 3 mmHg. IAS/Shunts: No atrial level shunt detected by color flow Doppler. Additional Comments: 3D was performed not requiring image post processing on an independent workstation and was normal.  LEFT VENTRICLE PLAX 2D LVIDd:         5.10 cm         Diastology LVIDs:         3.20 cm         LV e' medial:    12.50 cm/s LV PW:         0.70 cm         LV E/e' medial:  7.4 LV IVS:        0.70 cm         LV e' lateral:   22.20 cm/s LVOT diam:     2.20 cm         LV E/e' lateral: 4.2 LV SV:         77 LV SV Index:   47 LVOT Area:     3.80 cm        3D Volume EF                                LV 3D EF:    Left                                             ventricul                                             ar                                             ejection                                             fraction  by 3D                                             volume is                                             68 %.                                 3D Volume EF:                                3D EF:        68 %                                LV EDV:       163 ml                                LV ESV:       52 ml                                LV SV:        111 ml RIGHT VENTRICLE             IVC RV S prime:     17.90 cm/s  IVC diam: 1.70 cm TAPSE (M-mode): 3.6 cm LEFT ATRIUM             Index        RIGHT  ATRIUM           Index LA diam:        3.20 cm 1.96 cm/m   RA Area:     15.10 cm LA Vol (A2C):   46.2 ml 28.31 ml/m  RA Volume:   34.50 ml  21.16 ml/m LA Vol (A4C):   55.6 ml 34.10 ml/m LA Biplane Vol: 56.6 ml 34.71 ml/m  AORTIC VALVE AV Area (Vmax): 2.38 cm AV Vmax:        163.00 cm/s AV Peak Grad:   10.6 mmHg LVOT Vmax:      102.00 cm/s LVOT Vmean:     66.400 cm/s LVOT VTI:       0.202 m  AORTA Ao Root diam: 3.10 cm Ao Asc diam:  2.90 cm MITRAL VALVE               TRICUSPID VALVE MV Area (PHT): 3.48 cm    TR Peak grad:   8.1 mmHg MV Decel Time: 218 msec    TR Vmax:        142.00 cm/s MR Peak grad: 71.6 mmHg MR Vmax:      423.00 cm/s  SHUNTS MV E velocity: 92.40 cm/s  Systemic VTI:  0.20 m MV A velocity: 81.60 cm/s  Systemic Diam: 2.20 cm MV E/A ratio:  1.13 Chilton Si MD Electronically signed by Chilton Si MD Signature Date/Time: 11/19/2023/1:54:59 PM    Final  DG Chest Port 1 View Result Date: 11/18/2023 CLINICAL DATA:  Cough. EXAM: PORTABLE CHEST 1 VIEW COMPARISON:  06/02/2023 FINDINGS: Central peribronchial thickening noted bilaterally. No evidence of pulmonary airspace disease or hyperinflation. No evidence of pleural effusion. Heart size is normal. IMPRESSION: Central peribronchial thickening. No evidence of pneumonia. Electronically Signed   By: Danae Orleans M.D.   On: 11/18/2023 18:03    Future Appointments  Date Time Provider Department Center  12/09/2023  2:35 PM Venora Maples, MD [facility]    Discharge Condition: Stable  Discharge disposition: 01-Home or Self Care       Discharge Instructions     Diet - low sodium heart healthy   Complete by: As directed    Increase activity slowly   Complete by: As directed       Allergies as of 11/24/2023   No Known Allergies      Medication List     TAKE these medications    albuterol 108 (90 Base) MCG/ACT inhaler Commonly known as: VENTOLIN HFA Inhale 2 puffs into the lungs every 6 (six) hours as  needed for wheezing or shortness of breath.   buprenorphine-naloxone 8-2 mg Subl SL tablet Commonly known as: SUBOXONE Place 1 tablet under the tongue 3 (three) times daily.   cyclobenzaprine 10 MG tablet Commonly known as: FLEXERIL Take 1 tablet (10 mg total) by mouth 3 (three) times daily as needed for muscle spasms.   diphenhydrAMINE 50 MG capsule Commonly known as: BENADRYL Take 1 capsule (50 mg total) by mouth at bedtime as needed for sleep.   fluticasone 110 MCG/ACT inhaler Commonly known as: Flovent HFA Inhale 1 puff into the lungs in the morning and at bedtime.   naloxone 4 MG/0.1ML Liqd nasal spray kit Commonly known as: NARCAN Use as needed to reverse opioid overdose   potassium chloride SA 20 MEQ tablet Commonly known as: KLOR-CON M Take 1 tablet (20 mEq total) by mouth 2 (two) times daily.   prochlorperazine 10 MG tablet Commonly known as: COMPAZINE Take 1 tablet (10 mg total) by mouth every 6 (six) hours as needed for nausea or vomiting.   promethazine 25 MG tablet Commonly known as: PHENERGAN Take 1 tablet (25 mg total) by mouth every 6 (six) hours as needed for nausea or vomiting.   scopolamine 1 MG/3DAYS Commonly known as: TRANSDERM-SCOP Place 1 patch (1.5 mg total) onto the skin every 3 (three) days.   sertraline 50 MG tablet Commonly known as: ZOLOFT Take 1 tablet (50 mg total) by mouth daily. Start taking on: November 25, 2023   Vitafol Gummies 3.33-0.333-34.8 MG Chew Chew 3 tablets by mouth daily.        Follow-up Information     Venora Maples, MD Follow up on 12/09/2023.   Specialty: Family Medicine Why: 2:35 pm OB visit Contact information: [address] [place] [address] [phone]                 Total discharge time: 30 minutes   Signed: Lazaro Arms M.D. 11/24/2023, 3:24 PM

## 2023-11-19 NOTE — Progress Notes (Signed)
 Follow up visit with Gabrielle Nguyen who is known to this Clinical research associate from the [facility] where she was seen yesterday. Chaplain asked open ended questions to facilitate emotional expression and story telling. [name] shared that she is feeling terrible right now, but her mood and affect were appropriate to the situation. She reports that her physical symptoms were making it difficult to get in touch with her emotional state, but that she presently has the support of FOB Alex. We made a plan for good communication and support from her RN. Will continue to follow.   Please page as further needs arise.  [name]. Carley Hammed, M.Div. [facility] Chaplain Pager [phone] Office [phone]

## 2023-11-20 ENCOUNTER — Other Ambulatory Visit (HOSPITAL_COMMUNITY): Payer: Self-pay

## 2023-11-20 LAB — VITAMIN B12: Vitamin B-12: 157 pg/mL — ABNORMAL LOW (ref 180–914)

## 2023-11-20 LAB — FOLATE: Folate: 12 ng/mL (ref >5.9)

## 2023-11-20 MED ORDER — PROCHLORPERAZINE EDISYLATE 10 MG/2ML IJ SOLN
10.0000 mg | Freq: Four times a day (QID) | INTRAMUSCULAR | Status: DC | PRN
  Administered 2023-11-20 – 2023-11-22 (×4): 10 mg via INTRAVENOUS
  Filled 2023-11-20 (×5): qty 2

## 2023-11-20 MED ORDER — LACTATED RINGERS IV BOLUS
1000.0000 mL | Freq: Once | INTRAVENOUS | Status: AC
Start: 2023-11-20 — End: 2023-11-20
  Administered 2023-11-20: 1000 mL via INTRAVENOUS

## 2023-11-20 MED ORDER — METOCLOPRAMIDE HCL 5 MG/ML IJ SOLN
10.0000 mg | Freq: Three times a day (TID) | INTRAMUSCULAR | Status: DC
  Administered 2023-11-20 – 2023-11-24 (×13): 10 mg via INTRAVENOUS
  Filled 2023-11-20 (×13): qty 2

## 2023-11-20 MED ORDER — PROCHLORPERAZINE EDISYLATE 10 MG/2ML IJ SOLN
10.0000 mg | Freq: Four times a day (QID) | INTRAMUSCULAR | Status: DC
  Filled 2023-11-20 (×2): qty 2

## 2023-11-20 MED ORDER — ONDANSETRON HCL 4 MG/2ML IJ SOLN
4.0000 mg | Freq: Four times a day (QID) | INTRAMUSCULAR | Status: DC | PRN
  Administered 2023-11-20 – 2023-11-21 (×3): 4 mg via INTRAVENOUS
  Filled 2023-11-20 (×3): qty 2

## 2023-11-20 NOTE — Progress Notes (Signed)
 Patient ID: Gabrielle Nguyen, female   DOB: 1995-11-05, 28 y.o.   MRN: [id number]   Assessment/Plan: Principal Problem:   Opiate withdrawal (HCC) Active Problems:   Prolonged Q-T interval on ECG   Takotsubo cardiomyopathy   Cervical insufficiency during pregnancy in second trimester, antepartum   Anemia complicating pregnancy, second trimester   Supervision of high risk pregnancy, antepartum   Severe benzodiazepine use disorder (HCC)  Continue Suboxone 8 mg 3 times daily, currently without signs symptoms of opiate withdrawal. 2.   Continue to monitor for signs of benzo withdrawal.  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 Will hold x 5 days to see how withdrawal proceeds. Discussed with Dr. Crissie Reese who agrees with plan. 3.   She continues to have significant nausea and vomiting and is unable to keep anything down despite scopolamine patch.  Patient's prolonged QT makes other antiemetics a lot more problematic.  Will add scheduled Reglan.  Subjective: Interval History: Patient reports not feeling well this morning.  She continues to have nausea and vomiting with attempts at eating.  The patient reports taking 4 mg twice daily of Xanax daily with significant concerns around withdrawal.  Objective: Vital signs in last 24 hours: Temp:  [97.7 F (36.5 C)-98.6 F (37 C)] 97.7 F (36.5 C) (03/20 1214) Pulse Rate:  [69-94] 69 (03/20 1214) Resp:  [16-22] 16 (03/20 1214) BP: (96-115)/(50-70) 105/66 (03/20 1214) SpO2:  [93 %-95 %] 94 % (03/20 1214)  Intake/Output from previous day: 03/19 0701 - 03/20 0700 In: 159.2 [I.V.:159.2] Out: -  Intake/Output this shift: No intake/output data recorded.  General appearance: alert, cooperative, appears stated age, no distress, and chronically ill-appearing and pale Head: Normocephalic, without obvious abnormality, atraumatic Lungs: Normal effort Heart: regular rate and rhythm Abdomen: soft,  non-tender; bowel sounds normal; no masses,  no organomegaly  Results for orders placed or performed during the hospital encounter of 11/18/23 (from the past 24 hours)  Protein / creatinine ratio, urine     Status: None   Collection Time: 11/19/23  4:30 PM  Result Value Ref Range   Creatinine, Urine 140 mg/dL   Total Protein, Urine 13 mg/dL   Protein Creatinine Ratio 0.09 0.00 - 0.15 mg/mg[Cre]    Studies/Results: [place] MFM OB Detail + 14 Weeks Result Date: 11/19/2023 ----------------------------------------------------------------------  OBSTETRICS REPORT                       (Signed Final 11/19/2023 06:03 pm) ---------------------------------------------------------------------- Patient Info  ID #:       [id number]                          D.O.B.:  1995-11-26 (27 yrs)(F)  Name:       Gabrielle Nguyen                   Visit Date: 11/19/2023 12:20 pm ---------------------------------------------------------------------- Performed By  Attending:        Lin Landsman      Referred By:      [facility]                    MD                                       Care  Performed  By:     Percell Boston          [facility] ---------------------------------------------------------------------- Orders  #  Description                           Code        Ordered By  1  [place] MFM OB DETAIL +14 WK               76811.01    MATTHEW ECKSTAT ----------------------------------------------------------------------  #  Order #                     Accession #                Episode #  1  [id number]                   [id number]                 [id number] ---------------------------------------------------------------------- Indications  [redacted] weeks gestation of pregnancy                Z3A.19  Encounter for antenatal screening,             Z36.9  unspecified  Drug use complicating pregnancy, second        O99.322  trimester  Medical  complication of pregnancy (hx of       O26.90  takotsubo cardiomyopathy)  Insufficient Prenatal Care                     O09.30 ---------------------------------------------------------------------- Fetal Evaluation  Num Of Fetuses:         1  Fetal Heart Rate(bpm):  171  Cardiac Activity:       Observed  Presentation:           Transverse, head to maternal left  Placenta:               Posterior  P. Cord Insertion:      Visualized, central  Amniotic Fluid  AFI FV:      Within normal limits                              Largest Pocket(cm)                              3.5 ---------------------------------------------------------------------- Biometry  BPD:      47.2  mm     G. Age:  20w 2d         68  %    CI:        76.87   %    70 - 86  FL/HC:      20.0   %    16.8 - 19.8  HC:      170.5  mm     G. Age:  19w 5d         33  %    HC/AC:      1.11        1.09 - 1.39  AC:      153.5  mm     G. Age:  20w 4d         68  %    FL/BPD:     72.2   %  FL:       34.1  mm     G. Age:  20w 5d         73  %    FL/AC:      22.2   %    20 - 24  HUM:      30.8  mm     G. Age:  20w 2d         62  %  CER:      20.8  mm     G. Age:  19w 6d         69  %  NFT:       2.5  mm  Est. FW:     359  gm    0 lb 13 oz      82  % ---------------------------------------------------------------------- OB History  Gravidity:    5         Term:   3        Prem:   0        SAB:   1  TOP:          0       Ectopic:  0        Living: 3 ---------------------------------------------------------------------- Gestational Age  U/S Today:     20w 2d                                        EDD:   04/05/24  Best:          19w 6d     Det. By:  Marcella Dubs         EDD:   04/08/24                                      (08/22/23) ---------------------------------------------------------------------- Anatomy  Cranium:               Appears normal         LVOT:                   Appears normal  Cavum:                  Appears normal         Aortic Arch:            Appears normal  Ventricles:            Appears normal         Ductal Arch:            Not well visualized  Choroid Plexus:        Appears normal  Diaphragm:              Appears normal  Cerebellum:            Appears normal         Stomach:                Appears normal, left                                                                        sided  Posterior Fossa:       Appears normal         Abdomen:                Appears normal  Nuchal Fold:           Appears normal         Abdominal Wall:         Appears nml (cord                                                                        insert, abd wall)  Face:                  Appears normal         Cord Vessels:           Appears normal (3                         (orbits and profile)                           vessel cord)  Lips:                  Not well visualized    Kidneys:                Appear normal  Palate:                Appears normal         Bladder:                Appears normal  Thoracic:              Appears normal         Spine:                  Appears normal  Heart:                 Not well visualized    Upper Extremities:      Appears normal  RVOT:                  Appears normal         Lower Extremities:      Appears normal  Other:  Rt heel visualized. Lt hand visualized. Fetus appears to be female.  Technically difficult due to fetal position. ---------------------------------------------------------------------- Cervix Uterus Adnexa  Cervix  Length:            4.1  cm.  Normal appearance by transabdominal scan  Uterus  No abnormality visualized.  Right Ovary  Not visualized.  Left Ovary  Not visualized.  Cul De Sac  No free fluid seen.  Adnexa  No abnormality visualized ---------------------------------------------------------------------- Impression  Single intrauterine pregnancy here for a detailed anatomy  due to drug use in pregnancy.  Normal anatomy with measurements  consistent with dates  There is good fetal movement and amniotic fluid volume  Suboptimal views of the fetal anatomy were obtained  secondary to fetal position. ---------------------------------------------------------------------- Recommendations  Follow up growth in 4 weeks. ----------------------------------------------------------------------              Lin Landsman, MD Electronically Signed Final Report   11/19/2023 06:03 pm ----------------------------------------------------------------------   ECHOCARDIOGRAM COMPLETE Result Date: 11/19/2023    ECHOCARDIOGRAM REPORT   Patient Name:   [facility] Date of Exam: 11/19/2023 Medical Rec #:  [id number]     Height:       66.0 in Accession #:    [id number]    Weight:       123.7 lb Date of Birth:  1995-11-22    BSA:          1.630 m Patient Age:    27 years      BP:           101/64 mmHg Patient Gender: F             HR:           65 bpm. Exam Location:  Inpatient Procedure: 2D Echo, 3D Echo, Cardiac Doppler and Color Doppler (Both Spectral            and Color Flow Doppler were utilized during procedure). 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 CHF. Takotsubo Cardiomyopathy.  Sonographer:    Lucendia Herrlich RCS Referring Phys: [id number] MATTHEW M ECKSTAT IMPRESSIONS  1. Left ventricular ejection fraction, by estimation, is 65 to 70%. Left ventricular ejection fraction by 3D volume is 68 %. The left ventricle has normal function. The left ventricle has no regional wall motion abnormalities. Left ventricular diastolic  parameters were normal.  2. Right ventricular systolic function is normal. The right ventricular size is normal. There is normal pulmonary artery systolic pressure.  3. Left atrial size was moderately dilated.  4. The mitral valve is normal in structure. Mild mitral valve regurgitation. No evidence of mitral stenosis.  5.  The aortic valve is tricuspid. Aortic valve regurgitation is not visualized. No aortic stenosis is present.  6. The inferior vena cava is normal in size with greater than 50% respiratory variability, suggesting right atrial pressure of 3 mmHg. FINDINGS  Left Ventricle: Left ventricular ejection fraction, by estimation, is 65 to 70%. Left ventricular ejection fraction by 3D volume is 68 %. The left ventricle has normal function. The left ventricle has no regional wall motion abnormalities. The left ventricular internal cavity size was normal in size. There is no left ventricular hypertrophy. Left ventricular diastolic parameters were normal. Normal left ventricular filling pressure. Right Ventricle: The right ventricular size is normal. No increase in right ventricular wall thickness. Right ventricular systolic function  is normal. There is normal pulmonary artery systolic pressure. The tricuspid regurgitant velocity is 1.42 m/s, and  with an assumed right atrial pressure of 3 mmHg, the estimated right ventricular systolic pressure is 11.1 mmHg. Left Atrium: Left atrial size was moderately dilated. Right Atrium: Right atrial size was normal in size. Pericardium: There is no evidence of pericardial effusion. Mitral Valve: The mitral valve is normal in structure. Mild mitral valve regurgitation. No evidence of mitral valve stenosis. Tricuspid Valve: The tricuspid valve is normal in structure. Tricuspid valve regurgitation is trivial. No evidence of tricuspid stenosis. Aortic Valve: The aortic valve is tricuspid. Aortic valve regurgitation is not visualized. No aortic stenosis is present. Aortic valve peak gradient measures 10.6 mmHg. Pulmonic Valve: The pulmonic valve was normal in structure. Pulmonic valve regurgitation is trivial. No evidence of pulmonic stenosis. Aorta: The aortic root is normal in size and structure. Venous: The inferior vena cava is normal in size with greater than 50% respiratory variability,  suggesting right atrial pressure of 3 mmHg. IAS/Shunts: No atrial level shunt detected by color flow Doppler. Additional Comments: 3D was performed not requiring image post processing on an independent workstation and was normal.  LEFT VENTRICLE PLAX 2D LVIDd:         5.10 cm         Diastology LVIDs:         3.20 cm         LV e' medial:    12.50 cm/s LV PW:         0.70 cm         LV E/e' medial:  7.4 LV IVS:        0.70 cm         LV e' lateral:   22.20 cm/s LVOT diam:     2.20 cm         LV E/e' lateral: 4.2 LV SV:         77 LV SV Index:   47 LVOT Area:     3.80 cm        3D Volume EF                                LV 3D EF:    Left                                             ventricul                                             ar                                             ejection                                             fraction  by 3D                                             volume is                                             68 %.                                 3D Volume EF:                                3D EF:        68 %                                LV EDV:       163 ml                                LV ESV:       52 ml                                LV SV:        111 ml RIGHT VENTRICLE             IVC RV S prime:     17.90 cm/s  IVC diam: 1.70 cm TAPSE (M-mode): 3.6 cm LEFT ATRIUM             Index        RIGHT ATRIUM           Index LA diam:        3.20 cm 1.96 cm/m   RA Area:     15.10 cm LA Vol (A2C):   46.2 ml 28.31 ml/m  RA Volume:   34.50 ml  21.16 ml/m LA Vol (A4C):   55.6 ml 34.10 ml/m LA Biplane Vol: 56.6 ml 34.71 ml/m  AORTIC VALVE AV Area (Vmax): 2.38 cm AV Vmax:        163.00 cm/s AV Peak Grad:   10.6 mmHg LVOT Vmax:      102.00 cm/s LVOT Vmean:     66.400 cm/s LVOT VTI:       0.202 m  AORTA Ao Root diam: 3.10 cm Ao Asc diam:  2.90 cm MITRAL VALVE               TRICUSPID VALVE MV Area (PHT): 3.48 cm    TR Peak grad:   8.1 mmHg  MV Decel Time: 218 msec    TR Vmax:        142.00 cm/s MR Peak grad: 71.6 mmHg MR Vmax:      423.00 cm/s  SHUNTS MV E velocity: 92.40 cm/s  Systemic VTI:  0.20 m MV A velocity: 81.60 cm/s  Systemic Diam: 2.20 cm MV E/A ratio:  1.13 Chilton Si MD Electronically signed by Chilton Si MD Signature Date/Time: 11/19/2023/1:54:59 PM    Final  DG Chest Port 1 View Result Date: 11/18/2023 CLINICAL DATA:  Cough. EXAM: PORTABLE CHEST 1 VIEW COMPARISON:  06/02/2023 FINDINGS: Central peribronchial thickening noted bilaterally. No evidence of pulmonary airspace disease or hyperinflation. No evidence of pleural effusion. Heart size is normal. IMPRESSION: Central peribronchial thickening. No evidence of pneumonia. Electronically Signed   By: Danae Orleans M.D.   On: 11/18/2023 18:03    Scheduled Meds:  buprenorphine-naloxone  1 tablet Sublingual TID   docusate sodium  100 mg Oral Daily   metoCLOPramide (REGLAN) injection  10 mg Intravenous Q8H   prenatal multivitamin  1 tablet Oral Q1200   prochlorperazine  10 mg Oral Once   scopolamine  1 patch Transdermal Q72H   Continuous Infusions: PRN Meds:acetaminophen, albuterol, calcium carbonate, diphenhydrAMINE, EPINEPHrine, methylPREDNISolone (SOLU-MEDROL) injection, ondansetron (ZOFRAN) IV, prochlorperazine    LOS: 2 days   Reva Bores, MD 11/20/2023 2:04 PM

## 2023-11-21 DIAGNOSIS — E538 Deficiency of other specified B group vitamins: Secondary | ICD-10-CM | POA: Insufficient documentation

## 2023-11-21 LAB — GC/CHLAMYDIA PROBE AMP (~~LOC~~) NOT AT ARMC
Chlamydia: NEGATIVE
Comment: NEGATIVE
Comment: NORMAL
Neisseria Gonorrhea: NEGATIVE

## 2023-11-21 LAB — IRON AND TIBC
Iron: 341 ug/dL — ABNORMAL HIGH (ref 28–170)
Saturation Ratios: 83 % — ABNORMAL HIGH (ref 10.4–31.8)
TIBC: 409 ug/dL (ref 250–450)
UIBC: 68 ug/dL

## 2023-11-21 LAB — FERRITIN: Ferritin: 230 ng/mL (ref 11–307)

## 2023-11-21 MED ORDER — GLYCOPYRROLATE 1 MG PO TABS
1.0000 mg | ORAL_TABLET | Freq: Three times a day (TID) | ORAL | Status: DC
  Administered 2023-11-22 – 2023-11-24 (×5): 1 mg via ORAL
  Filled 2023-11-21 (×5): qty 1

## 2023-11-21 MED ORDER — LORAZEPAM 2 MG/ML IJ SOLN
1.0000 mg | Freq: Four times a day (QID) | INTRAMUSCULAR | Status: DC | PRN
  Filled 2023-11-21: qty 1

## 2023-11-21 MED ORDER — HYDROXYZINE HCL 50 MG PO TABS
25.0000 mg | ORAL_TABLET | ORAL | Status: DC | PRN
  Administered 2023-11-21: 25 mg via ORAL
  Filled 2023-11-21: qty 1

## 2023-11-21 MED ORDER — GLYCOPYRROLATE 0.2 MG/ML IJ SOLN
0.1000 mg | Freq: Three times a day (TID) | INTRAMUSCULAR | Status: DC
Start: 2023-11-21 — End: 2023-11-21

## 2023-11-21 MED ORDER — CYANOCOBALAMIN 1000 MCG/ML IJ SOLN
1000.0000 ug | Freq: Once | INTRAMUSCULAR | Status: DC
  Filled 2023-11-21: qty 1

## 2023-11-21 MED ORDER — LORAZEPAM 1 MG PO TABS: 1.0000 mg | ORAL_TABLET | Freq: Once | ORAL | Status: AC

## 2023-11-21 MED ORDER — DEXTROSE IN LACTATED RINGERS 5 % IV SOLN: INTRAVENOUS | Status: AC

## 2023-11-21 MED ORDER — LORAZEPAM 1 MG PO TABS: 1.0000 mg | ORAL_TABLET | Freq: Four times a day (QID) | ORAL | Status: DC | PRN

## 2023-11-21 MED ORDER — GLYCOPYRROLATE 1 MG PO TABS
1.0000 mg | ORAL_TABLET | Freq: Three times a day (TID) | ORAL | Status: DC
  Administered 2023-11-21: 1 mg via ORAL
  Filled 2023-11-21 (×2): qty 1

## 2023-11-21 MED ORDER — GLYCOPYRROLATE 0.2 MG/ML IJ SOLN
0.1000 mg | Freq: Three times a day (TID) | INTRAMUSCULAR | Status: DC
  Administered 2023-11-21 – 2023-11-23 (×5): 0.1 mg via INTRAVENOUS
  Filled 2023-11-21 (×5): qty 1

## 2023-11-21 MED ORDER — HYDROXYZINE HCL 50 MG PO TABS: 25.0000 mg | ORAL_TABLET | ORAL | Status: DC | PRN

## 2023-11-21 MED ORDER — CYCLOBENZAPRINE HCL 10 MG PO TABS
10.0000 mg | ORAL_TABLET | Freq: Three times a day (TID) | ORAL | Status: DC | PRN
  Administered 2023-11-21 – 2023-11-24 (×5): 10 mg via ORAL
  Filled 2023-11-21 (×5): qty 1

## 2023-11-21 MED ORDER — DIPHENHYDRAMINE HCL 25 MG PO CAPS: 50.0000 mg | ORAL_CAPSULE | Freq: Every evening | ORAL | Status: DC | PRN

## 2023-11-21 MED ORDER — LORAZEPAM 2 MG/ML IJ SOLN
1.0000 mg | Freq: Once | INTRAMUSCULAR | Status: AC
  Administered 2023-11-21: 1 mg via INTRAMUSCULAR

## 2023-11-21 NOTE — Plan of Care (Signed)
  Problem: Education: Goal: Knowledge of disease or condition will improve Outcome: Progressing Goal: Knowledge of the prescribed therapeutic regimen will improve Outcome: Progressing Goal: Individualized Educational Video(s) Outcome: Progressing   Problem: Clinical Measurements: Goal: Complications related to the disease process, condition or treatment will be avoided or minimized Outcome: Progressing   Problem: Education: Goal: Knowledge of General Education information will improve Description: Including pain rating scale, medication(s)/side effects and non-pharmacologic comfort measures Outcome: Progressing   Problem: Health Behavior/Discharge Planning: Goal: Ability to manage health-related needs will improve Outcome: Progressing

## 2023-11-21 NOTE — Progress Notes (Signed)
 Patient ID: Gabrielle Nguyen, female   DOB: 1995/12/10, 28 y.o.   MRN: [id number]   Assessment/Plan: Principal Problem:   Opiate withdrawal (HCC) Active Problems:   Prolonged Q-T interval on ECG   Takotsubo cardiomyopathy   Cervical insufficiency during pregnancy in second trimester, antepartum   Anemia complicating pregnancy, second trimester   Supervision of high risk pregnancy, antepartum   Severe benzodiazepine use disorder (HCC)   Vitamin B 12 deficiency   Apparently patient spit out her Suboxone on every dosing yesterday. Will continue this dose with antiemetics prior to dosing.  Her current CIWA score is 6. Continues have significant nausea and vomiting Begin maintenance IV fluids Due to jaw clenching will give Flexeril 10 mg 3 times daily as needed Due to significant insomnia will add Benadryl 50 mg p.o. nightly to help with sleep. Continue to monitor for benzo withdrawal Add Robinul IM B12 injection due to significant B12 deficiency Check iron levels and replete this if needed Thiamine level pending Daily weights   Subjective: Interval History: Patient reports inability to sleep.  She reports continued nausea and vomiting although according to most of the staff it appears she mostly is having spitting  Objective: Vital signs in last 24 hours: Temp:  [97.6 F (36.4 C)-98.3 F (36.8 C)] 97.9 F (36.6 C) (03/21 0928) Pulse Rate:  [62-90] 62 (03/21 0928) Resp:  [16-18] 18 (03/21 0928) BP: (105-119)/(60-75) 105/60 (03/21 0928) SpO2:  [93 %-98 %] 98 % (03/21 0928)   General appearance: alert, cooperative, appears stated age, and chronically ill-appearing and pale Head: Normocephalic, without obvious abnormality, atraumatic Neck: supple, symmetrical, trachea midline Lungs: Normal effort Heart: regular rate and rhythm Abdomen: soft, non-tender; bowel sounds normal; no masses,  no organomegaly Extremities: extremities normal, atraumatic, no cyanosis or edema  Results  for orders placed or performed during the hospital encounter of 11/18/23 (from the past 24 hours)  Vitamin B12     Status: Abnormal   Collection Time: 11/20/23  6:58 PM  Result Value Ref Range   Vitamin B-12 157 (L) 180 - 914 pg/mL  Folate     Status: None   Collection Time: 11/20/23  6:58 PM  Result Value Ref Range   Folate 12.0 >5.9 ng/mL    Studies/Results: [place] MFM OB Detail + 14 Weeks Result Date: 11/19/2023 ----------------------------------------------------------------------  OBSTETRICS REPORT                       (Signed Final 11/19/2023 06:03 pm) ---------------------------------------------------------------------- Patient Info  ID #:       [id number]                          D.O.B.:  11/23/1995 (27 yrs)(F)  Name:       Gabrielle Nguyen                   Visit Date: 11/19/2023 12:20 pm ---------------------------------------------------------------------- Performed By  Attending:        Lin Landsman      Referred By:      [facility]                    MD                                       Care  Performed By:     Herbert Seta  Waken          [facility] ---------------------------------------------------------------------- Orders  #  Description                           Code        Ordered By  1  [place] MFM OB DETAIL +14 WK               [id number]    MATTHEW ECKSTAT ----------------------------------------------------------------------  #  Order #                     Accession #                Episode #  1  [id number]                   [id number]                 [id number] ---------------------------------------------------------------------- Indications  [redacted] weeks gestation of pregnancy                Z3A.19  Encounter for antenatal screening,             Z36.9  unspecified  Drug use complicating pregnancy, second        O99.322  trimester  Medical complication of pregnancy (hx of       O26.90  takotsubo  cardiomyopathy)  Insufficient Prenatal Care                     O09.30 ---------------------------------------------------------------------- Fetal Evaluation  Num Of Fetuses:         1  Fetal Heart Rate(bpm):  171  Cardiac Activity:       Observed  Presentation:           Transverse, head to maternal left  Placenta:               Posterior  P. Cord Insertion:      Visualized, central  Amniotic Fluid  AFI FV:      Within normal limits                              Largest Pocket(cm)                              3.5 ---------------------------------------------------------------------- Biometry  BPD:      47.2  mm     G. Age:  20w 2d         68  %    CI:        76.87   %    70 - 86  FL/HC:      20.0   %    16.8 - 19.8  HC:      170.5  mm     G. Age:  19w 5d         33  %    HC/AC:      1.11        1.09 - 1.39  AC:      153.5  mm     G. Age:  20w 4d         68  %    FL/BPD:     72.2   %  FL:       34.1  mm     G. Age:  20w 5d         73  %    FL/AC:      22.2   %    20 - 24  HUM:      30.8  mm     G. Age:  20w 2d         62  %  CER:      20.8  mm     G. Age:  19w 6d         69  %  NFT:       2.5  mm  Est. FW:     359  gm    0 lb 13 oz      82  % ---------------------------------------------------------------------- OB History  Gravidity:    5         Term:   3        Prem:   0        SAB:   1  TOP:          0       Ectopic:  0        Living: 3 ---------------------------------------------------------------------- Gestational Age  U/S Today:     20w 2d                                        EDD:   04/05/24  Best:          19w 6d     Det. By:  Marcella Dubs         EDD:   04/08/24                                      (08/22/23) ---------------------------------------------------------------------- Anatomy  Cranium:               Appears normal         LVOT:                   Appears normal  Cavum:                 Appears normal         Aortic Arch:            Appears  normal  Ventricles:            Appears normal         Ductal Arch:            Not well visualized  Choroid Plexus:        Appears normal  Diaphragm:              Appears normal  Cerebellum:            Appears normal         Stomach:                Appears normal, left                                                                        sided  Posterior Fossa:       Appears normal         Abdomen:                Appears normal  Nuchal Fold:           Appears normal         Abdominal Wall:         Appears nml (cord                                                                        insert, abd wall)  Face:                  Appears normal         Cord Vessels:           Appears normal (3                         (orbits and profile)                           vessel cord)  Lips:                  Not well visualized    Kidneys:                Appear normal  Palate:                Appears normal         Bladder:                Appears normal  Thoracic:              Appears normal         Spine:                  Appears normal  Heart:                 Not well visualized    Upper Extremities:      Appears normal  RVOT:                  Appears normal         Lower Extremities:      Appears normal  Other:  Rt heel visualized. Lt hand visualized. Fetus appears to be female.  Technically difficult due to fetal position. ---------------------------------------------------------------------- Cervix Uterus Adnexa  Cervix  Length:            4.1  cm.  Normal appearance by transabdominal scan  Uterus  No abnormality visualized.  Right Ovary  Not visualized.  Left Ovary  Not visualized.  Cul De Sac  No free fluid seen.  Adnexa  No abnormality visualized ---------------------------------------------------------------------- Impression  Single intrauterine pregnancy here for a detailed anatomy  due to drug use in pregnancy.  Normal anatomy with measurements consistent with dates  There is good fetal movement and  amniotic fluid volume  Suboptimal views of the fetal anatomy were obtained  secondary to fetal position. ---------------------------------------------------------------------- Recommendations  Follow up growth in 4 weeks. ----------------------------------------------------------------------              Lin Landsman, MD Electronically Signed Final Report   11/19/2023 06:03 pm ----------------------------------------------------------------------   ECHOCARDIOGRAM COMPLETE Result Date: 11/19/2023    ECHOCARDIOGRAM REPORT   Patient Name:   [facility] Date of Exam: 11/19/2023 Medical Rec #:  [id number]     Height:       66.0 in Accession #:    [id number]    Weight:       123.7 lb Date of Birth:  10/18/1995    BSA:          1.630 m Patient Age:    27 years      BP:           101/64 mmHg Patient Gender: F             HR:           65 bpm. Exam Location:  Inpatient Procedure: 2D Echo, 3D Echo, Cardiac Doppler and Color Doppler (Both Spectral            and Color Flow Doppler were utilized during procedure). 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 CHF. Takotsubo Cardiomyopathy.  Sonographer:    Lucendia Herrlich RCS Referring Phys: [id number] MATTHEW M ECKSTAT IMPRESSIONS  1. Left ventricular ejection fraction, by estimation, is 65 to 70%. Left ventricular ejection fraction by 3D volume is 68 %. The left ventricle has normal function. The left ventricle has no regional wall motion abnormalities. Left ventricular diastolic  parameters were normal.  2. Right ventricular systolic function is normal. The right ventricular size is normal. There is normal pulmonary artery systolic pressure.  3. Left atrial size was moderately dilated.  4. The mitral valve is normal in structure. Mild mitral valve regurgitation. No evidence of mitral stenosis.  5. The aortic valve is tricuspid. Aortic valve  regurgitation is not visualized. No aortic stenosis is present.  6. The inferior vena cava is normal in size with greater than 50% respiratory variability, suggesting right atrial pressure of 3 mmHg. FINDINGS  Left Ventricle: Left ventricular ejection fraction, by estimation, is 65 to 70%. Left ventricular ejection fraction by 3D volume is 68 %. The left ventricle has normal function. The left ventricle has no regional wall motion abnormalities. The left ventricular internal cavity size was normal in size. There is no left ventricular hypertrophy. Left ventricular diastolic parameters were normal. Normal left ventricular filling pressure. Right Ventricle: The right ventricular size is normal. No increase in right ventricular wall thickness. Right ventricular systolic function  is normal. There is normal pulmonary artery systolic pressure. The tricuspid regurgitant velocity is 1.42 m/s, and  with an assumed right atrial pressure of 3 mmHg, the estimated right ventricular systolic pressure is 11.1 mmHg. Left Atrium: Left atrial size was moderately dilated. Right Atrium: Right atrial size was normal in size. Pericardium: There is no evidence of pericardial effusion. Mitral Valve: The mitral valve is normal in structure. Mild mitral valve regurgitation. No evidence of mitral valve stenosis. Tricuspid Valve: The tricuspid valve is normal in structure. Tricuspid valve regurgitation is trivial. No evidence of tricuspid stenosis. Aortic Valve: The aortic valve is tricuspid. Aortic valve regurgitation is not visualized. No aortic stenosis is present. Aortic valve peak gradient measures 10.6 mmHg. Pulmonic Valve: The pulmonic valve was normal in structure. Pulmonic valve regurgitation is trivial. No evidence of pulmonic stenosis. Aorta: The aortic root is normal in size and structure. Venous: The inferior vena cava is normal in size with greater than 50% respiratory variability, suggesting right atrial pressure of 3 mmHg.  IAS/Shunts: No atrial level shunt detected by color flow Doppler. Additional Comments: 3D was performed not requiring image post processing on an independent workstation and was normal.  LEFT VENTRICLE PLAX 2D LVIDd:         5.10 cm         Diastology LVIDs:         3.20 cm         LV e' medial:    12.50 cm/s LV PW:         0.70 cm         LV E/e' medial:  7.4 LV IVS:        0.70 cm         LV e' lateral:   22.20 cm/s LVOT diam:     2.20 cm         LV E/e' lateral: 4.2 LV SV:         77 LV SV Index:   47 LVOT Area:     3.80 cm        3D Volume EF                                LV 3D EF:    Left                                             ventricul                                             ar                                             ejection                                             fraction  by 3D                                             volume is                                             68 %.                                 3D Volume EF:                                3D EF:        68 %                                LV EDV:       163 ml                                LV ESV:       52 ml                                LV SV:        111 ml RIGHT VENTRICLE             IVC RV S prime:     17.90 cm/s  IVC diam: 1.70 cm TAPSE (M-mode): 3.6 cm LEFT ATRIUM             Index        RIGHT ATRIUM           Index LA diam:        3.20 cm 1.96 cm/m   RA Area:     15.10 cm LA Vol (A2C):   46.2 ml 28.31 ml/m  RA Volume:   34.50 ml  21.16 ml/m LA Vol (A4C):   55.6 ml 34.10 ml/m LA Biplane Vol: 56.6 ml 34.71 ml/m  AORTIC VALVE AV Area (Vmax): 2.38 cm AV Vmax:        163.00 cm/s AV Peak Grad:   10.6 mmHg LVOT Vmax:      102.00 cm/s LVOT Vmean:     66.400 cm/s LVOT VTI:       0.202 m  AORTA Ao Root diam: 3.10 cm Ao Asc diam:  2.90 cm MITRAL VALVE               TRICUSPID VALVE MV Area (PHT): 3.48 cm    TR Peak grad:   8.1 mmHg MV Decel Time: 218 msec    TR Vmax:         142.00 cm/s MR Peak grad: 71.6 mmHg MR Vmax:      423.00 cm/s  SHUNTS MV E velocity: 92.40 cm/s  Systemic VTI:  0.20 m MV A velocity: 81.60 cm/s  Systemic Diam: 2.20 cm MV E/A ratio:  1.13 Chilton Si MD Electronically signed by Chilton Si MD Signature Date/Time: 11/19/2023/1:54:59 PM    Final  DG Chest Port 1 View Result Date: 11/18/2023 CLINICAL DATA:  Cough. EXAM: PORTABLE CHEST 1 VIEW COMPARISON:  06/02/2023 FINDINGS: Central peribronchial thickening noted bilaterally. No evidence of pulmonary airspace disease or hyperinflation. No evidence of pleural effusion. Heart size is normal. IMPRESSION: Central peribronchial thickening. No evidence of pneumonia. Electronically Signed   By: Danae Orleans M.D.   On: 11/18/2023 18:03    Scheduled Meds:  buprenorphine-naloxone  1 tablet Sublingual TID   docusate sodium  100 mg Oral Daily   metoCLOPramide (REGLAN) injection  10 mg Intravenous Q8H   prenatal multivitamin  1 tablet Oral Q1200   prochlorperazine  10 mg Oral Once   scopolamine  1 patch Transdermal Q72H   Continuous Infusions:  dextrose 5% lactated ringers     PRN Meds:acetaminophen, albuterol, calcium carbonate, cyclobenzaprine, diphenhydrAMINE, diphenhydrAMINE, EPINEPHrine, methylPREDNISolone (SOLU-MEDROL) injection, ondansetron (ZOFRAN) IV, prochlorperazine    LOS: 3 days   Reva Bores, MD 11/21/2023 10:29 AM

## 2023-11-22 LAB — COMPREHENSIVE METABOLIC PANEL
ALT: 8 U/L (ref 0–44)
AST: 16 U/L (ref 15–41)
Albumin: 2.8 g/dL — ABNORMAL LOW (ref 3.5–5.0)
Alkaline Phosphatase: 40 U/L (ref 38–126)
Anion gap: 9 (ref 5–15)
BUN: 6 mg/dL (ref 6–20)
CO2: 20 mmol/L — ABNORMAL LOW (ref 22–32)
Calcium: 8.3 mg/dL — ABNORMAL LOW (ref 8.9–10.3)
Chloride: 103 mmol/L (ref 98–111)
Creatinine, Ser: 0.49 mg/dL (ref 0.44–1.00)
GFR, Estimated: >60 mL/min (ref >60)
Glucose, Bld: 114 mg/dL — ABNORMAL HIGH (ref 70–99)
Potassium: 3 mmol/L — ABNORMAL LOW (ref 3.5–5.1)
Sodium: 132 mmol/L — ABNORMAL LOW (ref 135–145)
Total Bilirubin: 0.3 mg/dL (ref 0.0–1.2)
Total Protein: 5.7 g/dL — ABNORMAL LOW (ref 6.5–8.1)

## 2023-11-22 NOTE — Progress Notes (Signed)
 Patient ID: Gabrielle Nguyen, female   DOB: 07-Nov-1995, 28 y.o.   MRN: [id number]   Assessment/Plan: Principal Problem:   Opiate withdrawal (HCC) Active Problems:   Prolonged Q-T interval on ECG   Takotsubo cardiomyopathy   Cervical insufficiency during pregnancy in second trimester, antepartum   Anemia complicating pregnancy, second trimester   Supervision of high risk pregnancy, antepartum   Severe benzodiazepine use disorder (HCC)   Vitamin B 12 deficiency  Continue Suboxone 8 mg 3 times daily patient was able to tolerate it with dissolving under her tongue yesterday.  Her current CIWA score is not documented it was 14 at midnight last night but she did get 1 dose of IV Ativan. Will continue this dose with antiemetics prior to dosing.  Patient was able to tolerate p.o. last evening Continue maintenance IV fluids Patient is had some loosening of her jaw overnight with a dose of Ativan as well as Flexeril and ice. Due to significant insomnia will add Benadryl 50 mg p.o. nightly to help with sleep. Continue to monitor for benzo withdrawal Add Robinul IM B12 injection due to significant B12 deficiency Iron levels are normal Thiamine level pending Daily weights   Subjective: Interval History: Patient reports feeling some better today  Objective: Vital signs in last 24 hours: Temp:  [97.5 F (36.4 C)-98.2 F (36.8 C)] 97.7 F (36.5 C) (03/22 0800) Pulse Rate:  [70-87] 72 (03/22 0800) Resp:  [16-20] 16 (03/22 0800) BP: (103-115)/(54-69) 106/58 (03/22 0800) SpO2:  [94 %-95 %] 95 % (03/22 0800)  Intake/Output from previous day: No intake/output data recorded. Intake/Output this shift: No intake/output data recorded.  General appearance: alert, cooperative, appears stated age, and pale appearing, chronically ill-appearing Head: Normocephalic, without obvious abnormality, atraumatic Lungs: Normal effort Heart: regular rate and rhythm Abdomen: soft, non-tender; bowel sounds  normal; no masses,  no organomegaly Extremities: Homans sign is negative, no sign of DVT  Results for orders placed or performed during the hospital encounter of 11/18/23 (from the past 24 hours)  Iron and TIBC     Status: Abnormal   Collection Time: 11/21/23  1:38 PM  Result Value Ref Range   Iron 341 (H) 28 - 170 ug/dL   TIBC 409 811 - 914 ug/dL   Saturation Ratios 83 (H) 10.4 - 31.8 %   UIBC 68 ug/dL  Ferritin     Status: None   Collection Time: 11/21/23  1:38 PM  Result Value Ref Range   Ferritin 230 11 - 307 ng/mL    Studies/Results: [place] MFM OB Detail + 14 Weeks Result Date: 11/19/2023 ----------------------------------------------------------------------  OBSTETRICS REPORT                       (Signed Final 11/19/2023 06:03 pm) ---------------------------------------------------------------------- Patient Info  ID #:       [id number]                          D.O.B.:  26-Dec-1995 (27 yrs)(F)  Name:       Gabrielle Nguyen                   Visit Date: 11/19/2023 12:20 pm ---------------------------------------------------------------------- Performed By  Attending:        Lin Landsman      Referred By:      [facility]                    MD  Care  Performed By:     Percell Boston          [facility] ---------------------------------------------------------------------- Orders  #  Description                           Code        Ordered By  1  [place] MFM OB DETAIL +14 WK               76811.01    MATTHEW ECKSTAT ----------------------------------------------------------------------  #  Order #                     Accession #                Episode #  1  [id number]                   [id number]                 [id number] ---------------------------------------------------------------------- Indications  [redacted] weeks gestation of pregnancy                Z3A.19  Encounter  for antenatal screening,             Z36.9  unspecified  Drug use complicating pregnancy, second        O99.322  trimester  Medical complication of pregnancy (hx of       O26.90  takotsubo cardiomyopathy)  Insufficient Prenatal Care                     O09.30 ---------------------------------------------------------------------- Fetal Evaluation  Num Of Fetuses:         1  Fetal Heart Rate(bpm):  171  Cardiac Activity:       Observed  Presentation:           Transverse, head to maternal left  Placenta:               Posterior  P. Cord Insertion:      Visualized, central  Amniotic Fluid  AFI FV:      Within normal limits                              Largest Pocket(cm)                              3.5 ---------------------------------------------------------------------- Biometry  BPD:      47.2  mm     G. Age:  20w 2d         68  %    CI:        76.87   %    70 - 86  FL/HC:      20.0   %    16.8 - 19.8  HC:      170.5  mm     G. Age:  19w 5d         33  %    HC/AC:      1.11        1.09 - 1.39  AC:      153.5  mm     G. Age:  20w 4d         68  %    FL/BPD:     72.2   %  FL:       34.1  mm     G. Age:  20w 5d         73  %    FL/AC:      22.2   %    20 - 24  HUM:      30.8  mm     G. Age:  20w 2d         62  %  CER:      20.8  mm     G. Age:  19w 6d         69  %  NFT:       2.5  mm  Est. FW:     359  gm    0 lb 13 oz      82  % ---------------------------------------------------------------------- OB History  Gravidity:    5         Term:   3        Prem:   0        SAB:   1  TOP:          0       Ectopic:  0        Living: 3 ---------------------------------------------------------------------- Gestational Age  U/S Today:     20w 2d                                        EDD:   04/05/24  Best:          19w 6d     Det. By:  Marcella Dubs         EDD:   04/08/24                                      (08/22/23)  ---------------------------------------------------------------------- Anatomy  Cranium:               Appears normal         LVOT:                   Appears normal  Cavum:                 Appears normal         Aortic Arch:            Appears normal  Ventricles:            Appears normal         Ductal Arch:            Not well visualized  Choroid Plexus:        Appears normal  Diaphragm:              Appears normal  Cerebellum:            Appears normal         Stomach:                Appears normal, left                                                                        sided  Posterior Fossa:       Appears normal         Abdomen:                Appears normal  Nuchal Fold:           Appears normal         Abdominal Wall:         Appears nml (cord                                                                        insert, abd wall)  Face:                  Appears normal         Cord Vessels:           Appears normal (3                         (orbits and profile)                           vessel cord)  Lips:                  Not well visualized    Kidneys:                Appear normal  Palate:                Appears normal         Bladder:                Appears normal  Thoracic:              Appears normal         Spine:                  Appears normal  Heart:                 Not well visualized    Upper Extremities:      Appears normal  RVOT:                  Appears normal         Lower Extremities:      Appears normal  Other:  Rt heel visualized. Lt hand visualized. Fetus appears to be female.  Technically difficult due to fetal position. ---------------------------------------------------------------------- Cervix Uterus Adnexa  Cervix  Length:            4.1  cm.  Normal appearance by transabdominal scan  Uterus  No abnormality visualized.  Right Ovary  Not visualized.  Left Ovary  Not visualized.  Cul De Sac  No free fluid seen.  Adnexa  No abnormality visualized  ---------------------------------------------------------------------- Impression  Single intrauterine pregnancy here for a detailed anatomy  due to drug use in pregnancy.  Normal anatomy with measurements consistent with dates  There is good fetal movement and amniotic fluid volume  Suboptimal views of the fetal anatomy were obtained  secondary to fetal position. ---------------------------------------------------------------------- Recommendations  Follow up growth in 4 weeks. ----------------------------------------------------------------------              Lin Landsman, MD Electronically Signed Final Report   11/19/2023 06:03 pm ----------------------------------------------------------------------   ECHOCARDIOGRAM COMPLETE Result Date: 11/19/2023    ECHOCARDIOGRAM REPORT   Patient Name:   Acadia Montana Date of Exam: 11/19/2023 Medical Rec #:  [id number]     Height:       66.0 in Accession #:    [id number]    Weight:       123.7 lb Date of Birth:  25-Nov-1995    BSA:          1.630 m Patient Age:    27 years      BP:           101/64 mmHg Patient Gender: F             HR:           65 bpm. Exam Location:  Inpatient Procedure: 2D Echo, 3D Echo, Cardiac Doppler and Color Doppler (Both Spectral            and Color Flow Doppler were utilized during procedure). 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 CHF. Takotsubo Cardiomyopathy.  Sonographer:    Lucendia Herrlich RCS Referring Phys: [id number] MATTHEW M ECKSTAT IMPRESSIONS  1. Left ventricular ejection fraction, by estimation, is 65 to 70%. Left ventricular ejection fraction by 3D volume is 68 %. The left ventricle has normal function. The left ventricle has no regional wall motion abnormalities. Left ventricular diastolic  parameters were normal.  2. Right ventricular systolic function is normal. The right ventricular size is normal. There  is normal pulmonary artery systolic pressure.  3. Left atrial size was moderately dilated.  4. The mitral valve is normal in structure. Mild mitral valve regurgitation. No evidence of mitral stenosis.  5. The aortic valve is tricuspid. Aortic valve regurgitation is not visualized. No aortic stenosis is present.  6. The inferior vena cava is normal in size with greater than 50% respiratory variability, suggesting right atrial pressure of 3 mmHg. FINDINGS  Left Ventricle: Left ventricular ejection fraction, by estimation, is 65 to 70%. Left ventricular ejection fraction by 3D volume is 68 %. The left ventricle has normal function. The left ventricle has no regional wall motion abnormalities. The left ventricular internal cavity size was normal in size. There is no left ventricular hypertrophy. Left ventricular diastolic parameters were normal. Normal left ventricular filling pressure. Right Ventricle: The right ventricular size is normal. No increase in right ventricular wall thickness. Right ventricular systolic function  is normal. There is normal pulmonary artery systolic pressure. The tricuspid regurgitant velocity is 1.42 m/s, and  with an assumed right atrial pressure of 3 mmHg, the estimated right ventricular systolic pressure is 11.1 mmHg. Left Atrium: Left atrial size was moderately dilated. Right Atrium: Right atrial size was normal in size. Pericardium: There is no evidence of pericardial effusion. Mitral Valve: The mitral valve is normal in structure. Mild mitral valve regurgitation. No evidence of mitral valve stenosis. Tricuspid Valve: The tricuspid valve is normal in structure. Tricuspid valve regurgitation is trivial. No evidence of tricuspid stenosis. Aortic Valve: The aortic valve is tricuspid. Aortic valve regurgitation is not visualized. No aortic stenosis is present. Aortic valve peak gradient measures 10.6 mmHg. Pulmonic Valve: The pulmonic valve was normal in structure. Pulmonic valve  regurgitation is trivial. No evidence of pulmonic stenosis. Aorta: The aortic root is normal in size and structure. Venous: The inferior vena cava is normal in size with greater than 50% respiratory variability, suggesting right atrial pressure of 3 mmHg. IAS/Shunts: No atrial level shunt detected by color flow Doppler. Additional Comments: 3D was performed not requiring image post processing on an independent workstation and was normal.  LEFT VENTRICLE PLAX 2D LVIDd:         5.10 cm         Diastology LVIDs:         3.20 cm         LV e' medial:    12.50 cm/s LV PW:         0.70 cm         LV E/e' medial:  7.4 LV IVS:        0.70 cm         LV e' lateral:   22.20 cm/s LVOT diam:     2.20 cm         LV E/e' lateral: 4.2 LV SV:         77 LV SV Index:   47 LVOT Area:     3.80 cm        3D Volume EF                                LV 3D EF:    Left                                             ventricul                                             ar                                             ejection                                             fraction  by 3D                                             volume is                                             68 %.                                 3D Volume EF:                                3D EF:        68 %                                LV EDV:       163 ml                                LV ESV:       52 ml                                LV SV:        111 ml RIGHT VENTRICLE             IVC RV S prime:     17.90 cm/s  IVC diam: 1.70 cm TAPSE (M-mode): 3.6 cm LEFT ATRIUM             Index        RIGHT ATRIUM           Index LA diam:        3.20 cm 1.96 cm/m   RA Area:     15.10 cm LA Vol (A2C):   46.2 ml 28.31 ml/m  RA Volume:   34.50 ml  21.16 ml/m LA Vol (A4C):   55.6 ml 34.10 ml/m LA Biplane Vol: 56.6 ml 34.71 ml/m  AORTIC VALVE AV Area (Vmax): 2.38 cm AV Vmax:        163.00 cm/s AV Peak Grad:   10.6 mmHg LVOT Vmax:       102.00 cm/s LVOT Vmean:     66.400 cm/s LVOT VTI:       0.202 m  AORTA Ao Root diam: 3.10 cm Ao Asc diam:  2.90 cm MITRAL VALVE               TRICUSPID VALVE MV Area (PHT): 3.48 cm    TR Peak grad:   8.1 mmHg MV Decel Time: 218 msec    TR Vmax:        142.00 cm/s MR Peak grad: 71.6 mmHg MR Vmax:      423.00 cm/s  SHUNTS MV E velocity: 92.40 cm/s  Systemic VTI:  0.20 m MV A velocity: 81.60 cm/s  Systemic Diam: 2.20 cm MV E/A ratio:  1.13 Chilton Si MD Electronically signed by Chilton Si MD Signature Date/Time: 11/19/2023/1:54:59 PM    Final  DG Chest Port 1 View Result Date: 11/18/2023 CLINICAL DATA:  Cough. EXAM: PORTABLE CHEST 1 VIEW COMPARISON:  06/02/2023 FINDINGS: Central peribronchial thickening noted bilaterally. No evidence of pulmonary airspace disease or hyperinflation. No evidence of pleural effusion. Heart size is normal. IMPRESSION: Central peribronchial thickening. No evidence of pneumonia. Electronically Signed   By: Danae Orleans M.D.   On: 11/18/2023 18:03    Scheduled Meds:  buprenorphine-naloxone  1 tablet Sublingual TID   cyanocobalamin  1,000 mcg Intramuscular Once   docusate sodium  100 mg Oral Daily   glycopyrrolate  1 mg Oral TID   Or   glycopyrrolate  0.1 mg Intravenous TID   metoCLOPramide (REGLAN) injection  10 mg Intravenous Q8H   prenatal multivitamin  1 tablet Oral Q1200   prochlorperazine  10 mg Oral Once   scopolamine  1 patch Transdermal Q72H   Continuous Infusions:  dextrose 5% lactated ringers 75 mL/hr at 11/22/23 0034   PRN Meds:acetaminophen, albuterol, calcium carbonate, cyclobenzaprine, diphenhydrAMINE, diphenhydrAMINE, EPINEPHrine, hydrOXYzine, methylPREDNISolone (SOLU-MEDROL) injection, ondansetron (ZOFRAN) IV, prochlorperazine    LOS: 4 days   Reva Bores, MD 11/22/2023 9:46 AM

## 2023-11-23 MED ORDER — POTASSIUM CHLORIDE CRYS ER 20 MEQ PO TBCR
20.0000 meq | EXTENDED_RELEASE_TABLET | Freq: Two times a day (BID) | ORAL | Status: DC
  Filled 2023-11-23: qty 1

## 2023-11-23 MED ORDER — SODIUM CHLORIDE 0.9% FLUSH
3.0000 mL | Freq: Two times a day (BID) | INTRAVENOUS | Status: DC
  Administered 2023-11-23 – 2023-11-24 (×2): 3 mL via INTRAVENOUS

## 2023-11-23 MED ORDER — LORAZEPAM 1 MG PO TABS
1.0000 mg | ORAL_TABLET | Freq: Three times a day (TID) | ORAL | Status: DC
  Administered 2023-11-23 – 2023-11-24 (×4): 1 mg via ORAL
  Filled 2023-11-23 (×4): qty 1

## 2023-11-23 MED ORDER — SERTRALINE HCL 50 MG PO TABS
50.0000 mg | ORAL_TABLET | Freq: Every day | ORAL | Status: DC
  Administered 2023-11-24: 50 mg via ORAL
  Filled 2023-11-23 (×2): qty 1

## 2023-11-23 NOTE — Plan of Care (Signed)
  Problem: Education: Goal: Knowledge of disease or condition will improve Outcome: Progressing Goal: Knowledge of the prescribed therapeutic regimen will improve Outcome: Progressing Goal: Individualized Educational Video(s) Outcome: Progressing   Problem: Clinical Measurements: Goal: Complications related to the disease process, condition or treatment will be avoided or minimized Outcome: Progressing   Problem: Education: Goal: Knowledge of General Education information will improve Description: Including pain rating scale, medication(s)/side effects and non-pharmacologic comfort measures Outcome: Progressing   Problem: Health Behavior/Discharge Planning: Goal: Ability to manage health-related needs will improve Outcome: Progressing   Problem: Clinical Measurements: Goal: Ability to maintain clinical measurements within normal limits will improve Outcome: Progressing Goal: Will remain free from infection Outcome: Progressing Goal: Diagnostic test results will improve Outcome: Progressing Goal: Respiratory complications will improve Outcome: Progressing Goal: Cardiovascular complication will be avoided Outcome: Progressing   Problem: Activity: Goal: Risk for activity intolerance will decrease Outcome: Progressing   Problem: Nutrition: Goal: Adequate nutrition will be maintained Outcome: Progressing   Problem: Coping: Goal: Level of anxiety will decrease Outcome: Progressing   Problem: Elimination: Goal: Will not experience complications related to bowel motility Outcome: Progressing Goal: Will not experience complications related to urinary retention Outcome: Progressing   Problem: Pain Managment: Goal: General experience of comfort will improve and/or be controlled Outcome: Progressing   Problem: Safety: Goal: Ability to remain free from injury will improve Outcome: Progressing   Problem: Skin Integrity: Goal: Risk for impaired skin integrity will  decrease Outcome: Progressing   Problem: Education: Goal: Knowledge of disease or condition will improve Outcome: Progressing Goal: Understanding of discharge needs will improve Outcome: Progressing   Problem: Health Behavior/Discharge Planning: Goal: Ability to identify changes in lifestyle to reduce recurrence of condition will improve Outcome: Progressing Goal: Identification of resources available to assist in meeting health care needs will improve Outcome: Progressing   Problem: Physical Regulation: Goal: Complications related to the disease process, condition or treatment will be avoided or minimized Outcome: Progressing   Problem: Safety: Goal: Ability to remain free from injury will improve Outcome: Progressing

## 2023-11-23 NOTE — Progress Notes (Signed)
 Patient resting when this RN entered room.  Opens eyes when spoken to, but does not engage in conversation.  Pt. Has blank stare.  When asked of she was nauseated she indicated yes with a "thumbs up gesture."  Speech slurred, vital signs stable.  Pt. Also drooling and makes no effort to cleanse self.  Oral care done.

## 2023-11-23 NOTE — Progress Notes (Signed)
 Pt has been minimally responsive throughout the day. Will intermittently stare at the wall and not respond to questions.  Sits with very little movement for long periods of time but can ambulate to the bathroom when she needs to urinate.  Pt responds appropriately to questions with one word statements or gestures, however will either maintain constant wide eye contact or will not look at you. Reports that her jaw is locked and difficult to move.  States this happened last time she withdrew from benzos.  Pupils are wide and respirations and HR are intermittently increased but no fidgeting, shivering, goosebumps, sweating or agitation noted.    MD notified.
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
## 2023-11-23 NOTE — Progress Notes (Signed)
 Patient ID: Gabrielle Nguyen, female   DOB: 1996-04-21, 28 y.o.   MRN: [id number]   Assessment/Plan: Principal Problem:   Opiate withdrawal (HCC) Active Problems:   Prolonged Q-T interval on ECG   Takotsubo cardiomyopathy   Cervical insufficiency during pregnancy in second trimester, antepartum   Anemia complicating pregnancy, second trimester   Supervision of high risk pregnancy, antepartum   Severe benzodiazepine use disorder (HCC)   Vitamin B 12 deficiency  Continue Suboxone 8 mg 3 times daily patient was able to tolerate it with dissolving under her tongue yesterday.  Her current CIWA score is 9.  She is less conversant this am. Will review her meds and decrease them some. Will continue this dose with antiemetics prior to dosing.  Patient was able to tolerate p.o. last evening Continue maintenance IV fluids Benadryl 50 mg p.o. nightly to help with sleep. Continue to monitor for benzo withdrawal Robinul prn IM B12 injection due to significant B12 deficiency Iron levels are normal Thiamine level pending Daily weights  Subjective: Interval History:somnolent today  Objective: Vital signs in last 24 hours: Temp:  [97.7 F (36.5 C)-98.4 F (36.9 C)] 97.9 F (36.6 C) (03/23 0738) Pulse Rate:  [82-122] 92 (03/23 0738) Resp:  [15-18] 18 (03/23 0738) BP: (95-117)/(50-68) 117/68 (03/23 0738) SpO2:  [92 %-96 %] 96 % (03/23 0815)  Intake/Output from previous day: No intake/output data recorded. Intake/Output this shift: No intake/output data recorded.  General appearance: alert, cooperative, and appears stated age Head: Normocephalic, without obvious abnormality, atraumatic Lungs: normal effort Heart: regular rate and rhythm Abdomen: soft, non-tender; bowel sounds normal; no masses,  no organomegaly  Results for orders placed or performed during the hospital encounter of 11/18/23 (from the past 24 hours)  Comprehensive metabolic panel     Status: Abnormal   Collection Time:  11/22/23  2:10 PM  Result Value Ref Range   Sodium 132 (L) 135 - 145 mmol/L   Potassium 3.0 (L) 3.5 - 5.1 mmol/L   Chloride 103 98 - 111 mmol/L   CO2 20 (L) 22 - 32 mmol/L   Glucose, Bld 114 (H) 70 - 99 mg/dL   BUN 6 6 - 20 mg/dL   Creatinine, Ser 4.09 0.44 - 1.00 mg/dL   Calcium 8.3 (L) 8.9 - 10.3 mg/dL   Total Protein 5.7 (L) 6.5 - 8.1 g/dL   Albumin 2.8 (L) 3.5 - 5.0 g/dL   AST 16 15 - 41 U/L   ALT 8 0 - 44 U/L   Alkaline Phosphatase 40 38 - 126 U/L   Total Bilirubin 0.3 0.0 - 1.2 mg/dL   GFR, Estimated >81 >19 mL/min   Anion gap 9 5 - 15    Studies/Results: [place] MFM OB Detail + 14 Weeks Result Date: 11/19/2023 ----------------------------------------------------------------------  OBSTETRICS REPORT                       (Signed Final 11/19/2023 06:03 pm) ---------------------------------------------------------------------- Patient Info  ID #:       [id number]                          D.O.B.:  May 06, 1996 (27 yrs)(F)  Name:       Gabrielle Nguyen                   Visit Date: 11/19/2023 12:20 pm ---------------------------------------------------------------------- Performed By  Attending:        Lin Landsman  Referred By:      [facility]                    MD                                       Care  Performed By:     Percell Boston          [facility] ---------------------------------------------------------------------- Orders  #  Description                           Code        Ordered By  1  [place] MFM OB DETAIL +14 WK               76811.01    MATTHEW ECKSTAT ----------------------------------------------------------------------  #  Order #                     Accession #                Episode #  1  [id number]                   [id number]                 [id number] ---------------------------------------------------------------------- Indications  [redacted] weeks gestation of pregnancy                 Z3A.19  Encounter for antenatal screening,             Z36.9  unspecified  Drug use complicating pregnancy, second        O99.322  trimester  Medical complication of pregnancy (hx of       O26.90  takotsubo cardiomyopathy)  Insufficient Prenatal Care                     O09.30 ---------------------------------------------------------------------- Fetal Evaluation  Num Of Fetuses:         1  Fetal Heart Rate(bpm):  171  Cardiac Activity:       Observed  Presentation:           Transverse, head to maternal left  Placenta:               Posterior  P. Cord Insertion:      Visualized, central  Amniotic Fluid  AFI FV:      Within normal limits                              Largest Pocket(cm)                              3.5 ---------------------------------------------------------------------- Biometry  BPD:      47.2  mm     G. Age:  20w 2d         68  %  CI:        76.87   %    70 - 86                                                          FL/HC:      20.0   %    16.8 - 19.8  HC:      170.5  mm     G. Age:  19w 5d         33  %    HC/AC:      1.11        1.09 - 1.39  AC:      153.5  mm     G. Age:  20w 4d         68  %    FL/BPD:     72.2   %  FL:       34.1  mm     G. Age:  20w 5d         73  %    FL/AC:      22.2   %    20 - 24  HUM:      30.8  mm     G. Age:  20w 2d         62  %  CER:      20.8  mm     G. Age:  19w 6d         69  %  NFT:       2.5  mm  Est. FW:     359  gm    0 lb 13 oz      82  % ---------------------------------------------------------------------- OB History  Gravidity:    5         Term:   3        Prem:   0        SAB:   1  TOP:          0       Ectopic:  0        Living: 3 ---------------------------------------------------------------------- Gestational Age  U/S Today:     20w 2d                                        EDD:   04/05/24  Best:          19w 6d     Det. ByMarcella Dubs         EDD:   04/08/24                                      (08/22/23)  ---------------------------------------------------------------------- Anatomy  Cranium:               Appears normal         LVOT:                   Appears normal  Cavum:                 Appears normal  Aortic Arch:            Appears normal  Ventricles:            Appears normal         Ductal Arch:            Not well visualized  Choroid Plexus:        Appears normal         Diaphragm:              Appears normal  Cerebellum:            Appears normal         Stomach:                Appears normal, left                                                                        sided  Posterior Fossa:       Appears normal         Abdomen:                Appears normal  Nuchal Fold:           Appears normal         Abdominal Wall:         Appears nml (cord                                                                        insert, abd wall)  Face:                  Appears normal         Cord Vessels:           Appears normal (3                         (orbits and profile)                           vessel cord)  Lips:                  Not well visualized    Kidneys:                Appear normal  Palate:                Appears normal         Bladder:                Appears normal  Thoracic:              Appears normal         Spine:                  Appears normal  Heart:  Not well visualized    Upper Extremities:      Appears normal  RVOT:                  Appears normal         Lower Extremities:      Appears normal  Other:  Rt heel visualized. Lt hand visualized. Fetus appears to be female.          Technically difficult due to fetal position. ---------------------------------------------------------------------- Cervix Uterus Adnexa  Cervix  Length:            4.1  cm.  Normal appearance by transabdominal scan  Uterus  No abnormality visualized.  Right Ovary  Not visualized.  Left Ovary  Not visualized.  Cul De Sac  No free fluid seen.  Adnexa  No abnormality visualized  ---------------------------------------------------------------------- Impression  Single intrauterine pregnancy here for a detailed anatomy  due to drug use in pregnancy.  Normal anatomy with measurements consistent with dates  There is good fetal movement and amniotic fluid volume  Suboptimal views of the fetal anatomy were obtained  secondary to fetal position. ---------------------------------------------------------------------- Recommendations  Follow up growth in 4 weeks. ----------------------------------------------------------------------              Lin Landsman, MD Electronically Signed Final Report   11/19/2023 06:03 pm ----------------------------------------------------------------------   ECHOCARDIOGRAM COMPLETE Result Date: 11/19/2023    ECHOCARDIOGRAM REPORT   Patient [facility]:   [facility] Date of Exam: 11/19/2023 Medical Rec #:  [id number]     Height:       66.0 in Accession #:    [id number]    Weight:       123.7 lb Date of Birth:  Sep 20, 1995    BSA:          1.630 m Patient Age:    27 years      BP:           101/64 mmHg Patient Gender: F             HR:           65 bpm. Exam Location:  Inpatient Procedure: 2D Echo, 3D Echo, Cardiac Doppler and Color Doppler (Both Spectral            and Color Flow Doppler were utilized during procedure). 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 CHF. Takotsubo Cardiomyopathy.  Sonographer:    Lucendia Herrlich RCS Referring Phys: [id number] MATTHEW M ECKSTAT IMPRESSIONS  1. Left ventricular ejection fraction, by estimation, is 65 to 70%. Left ventricular ejection fraction by 3D volume is 68 %. The left ventricle has normal function. The left ventricle has no regional wall motion abnormalities. Left ventricular diastolic  parameters were normal.  2. Right ventricular systolic function is normal. The right ventricular size is normal. There  is normal pulmonary artery systolic pressure.  3. Left atrial size was moderately dilated.  4. The mitral valve is normal in structure. Mild mitral valve regurgitation. No evidence of mitral stenosis.  5. The aortic valve is tricuspid. Aortic valve regurgitation is not visualized. No aortic stenosis is present.  6. The inferior vena cava is normal in size with greater than 50% respiratory variability, suggesting right atrial pressure of 3 mmHg. FINDINGS  Left Ventricle: Left ventricular ejection fraction, by  estimation, is 65 to 70%. Left ventricular ejection fraction by 3D volume is 68 %. The left ventricle has normal function. The left ventricle has no regional wall motion abnormalities. The left ventricular internal cavity size was normal in size. There is no left ventricular hypertrophy. Left ventricular diastolic parameters were normal. Normal left ventricular filling pressure. Right Ventricle: The right ventricular size is normal. No increase in right ventricular wall thickness. Right ventricular systolic function is normal. There is normal pulmonary artery systolic pressure. The tricuspid regurgitant velocity is 1.42 m/s, and  with an assumed right atrial pressure of 3 mmHg, the estimated right ventricular systolic pressure is 11.1 mmHg. Left Atrium: Left atrial size was moderately dilated. Right Atrium: Right atrial size was normal in size. Pericardium: There is no evidence of pericardial effusion. Mitral Valve: The mitral valve is normal in structure. Mild mitral valve regurgitation. No evidence of mitral valve stenosis. Tricuspid Valve: The tricuspid valve is normal in structure. Tricuspid valve regurgitation is trivial. No evidence of tricuspid stenosis. Aortic Valve: The aortic valve is tricuspid. Aortic valve regurgitation is not visualized. No aortic stenosis is present. Aortic valve peak gradient measures 10.6 mmHg. Pulmonic Valve: The pulmonic valve was normal in structure. Pulmonic valve  regurgitation is trivial. No evidence of pulmonic stenosis. Aorta: The aortic root is normal in size and structure. Venous: The inferior vena cava is normal in size with greater than 50% respiratory variability, suggesting right atrial pressure of 3 mmHg. IAS/Shunts: No atrial level shunt detected by color flow Doppler. Additional Comments: 3D was performed not requiring image post processing on an independent workstation and was normal.  LEFT VENTRICLE PLAX 2D LVIDd:         5.10 cm         Diastology LVIDs:         3.20 cm         LV e' medial:    12.50 cm/s LV PW:         0.70 cm         LV E/e' medial:  7.4 LV IVS:        0.70 cm         LV e' lateral:   22.20 cm/s LVOT diam:     2.20 cm         LV E/e' lateral: 4.2 LV SV:         77 LV SV Index:   47 LVOT Area:     3.80 cm        3D Volume EF                                LV 3D EF:    Left                                             ventricul                                             ar  ejection                                             fraction                                             by 3D                                             volume is                                             68 %.                                 3D Volume EF:                                3D EF:        68 %                                LV EDV:       163 ml                                LV ESV:       52 ml                                LV SV:        111 ml RIGHT VENTRICLE             IVC RV S prime:     17.90 cm/s  IVC diam: 1.70 cm TAPSE (M-mode): 3.6 cm LEFT ATRIUM             Index        RIGHT ATRIUM           Index LA diam:        3.20 cm 1.96 cm/m   RA Area:     15.10 cm LA Vol (A2C):   46.2 ml 28.31 ml/m  RA Volume:   34.50 ml  21.16 ml/m LA Vol (A4C):   55.6 ml 34.10 ml/m LA Biplane Vol: 56.6 ml 34.71 ml/m  AORTIC VALVE AV Area (Vmax): 2.38 cm AV Vmax:        163.00 cm/s AV Peak Grad:   10.6 mmHg LVOT Vmax:       102.00 cm/s LVOT Vmean:     66.400 cm/s LVOT VTI:       0.202 m  AORTA Ao Root diam: 3.10 cm Ao Asc diam:  2.90 cm MITRAL VALVE               TRICUSPID VALVE MV Area (PHT): 3.48  cm    TR Peak grad:   8.1 mmHg MV Decel Time: 218 msec    TR Vmax:        142.00 cm/s MR Peak grad: 71.6 mmHg MR Vmax:      423.00 cm/s  SHUNTS MV E velocity: 92.40 cm/s  Systemic VTI:  0.20 m MV A velocity: 81.60 cm/s  Systemic Diam: 2.20 cm MV E/A ratio:  1.13 Chilton Si MD Electronically signed by Chilton Si MD Signature Date/Time: 11/19/2023/1:54:59 PM    Final    DG Chest Port 1 View Result Date: 11/18/2023 CLINICAL DATA:  Cough. EXAM: PORTABLE CHEST 1 VIEW COMPARISON:  06/02/2023 FINDINGS: Central peribronchial thickening noted bilaterally. No evidence of pulmonary airspace disease or hyperinflation. No evidence of pleural effusion. Heart size is normal. IMPRESSION: Central peribronchial thickening. No evidence of pneumonia. Electronically Signed   By: Danae Orleans M.D.   On: 11/18/2023 18:03    Scheduled Meds:  buprenorphine-naloxone  1 tablet Sublingual TID   cyanocobalamin  1,000 mcg Intramuscular Once   docusate sodium  100 mg Oral Daily   glycopyrrolate  1 mg Oral TID   Or   glycopyrrolate  0.1 mg Intravenous TID   metoCLOPramide (REGLAN) injection  10 mg Intravenous Q8H   prenatal multivitamin  1 tablet Oral Q1200   prochlorperazine  10 mg Oral Once   scopolamine  1 patch Transdermal Q72H   Continuous Infusions: PRN Meds:acetaminophen, albuterol, calcium carbonate, cyclobenzaprine, diphenhydrAMINE, diphenhydrAMINE, EPINEPHrine, hydrOXYzine, methylPREDNISolone (SOLU-MEDROL) injection, ondansetron (ZOFRAN) IV, prochlorperazine    LOS: 5 days   Reva Bores, MD 11/23/2023 10:20 AM

## 2023-11-23 NOTE — Progress Notes (Signed)
 Pt. Sitting in recliner, laughing and conversing with staff. She is preparing to take a shower, ambulate in hall and go to family room/outside patio.

## 2023-11-24 ENCOUNTER — Other Ambulatory Visit (HOSPITAL_COMMUNITY): Payer: Self-pay

## 2023-11-24 ENCOUNTER — Encounter: Payer: Self-pay | Admitting: Family Medicine

## 2023-11-24 DIAGNOSIS — Z3A20 20 weeks gestation of pregnancy: Secondary | ICD-10-CM

## 2023-11-24 DIAGNOSIS — F1193 Opioid use, unspecified with withdrawal: Secondary | ICD-10-CM

## 2023-11-24 LAB — VITAMIN B1: Vitamin B1 (Thiamine): 84.1 nmol/L (ref 66.5–200.0)

## 2023-11-24 MED ORDER — CYCLOBENZAPRINE HCL 10 MG PO TABS
10.0000 mg | ORAL_TABLET | Freq: Three times a day (TID) | ORAL | 0 refills | Status: DC | PRN
  Filled 2023-11-24: qty 30, 10d supply, fill #0

## 2023-11-24 MED ORDER — POTASSIUM CHLORIDE CRYS ER 20 MEQ PO TBCR
20.0000 meq | EXTENDED_RELEASE_TABLET | Freq: Two times a day (BID) | ORAL | 1 refills | Status: DC
  Filled 2023-11-24: qty 60, 30d supply, fill #0

## 2023-11-24 MED ORDER — SERTRALINE HCL 50 MG PO TABS
50.0000 mg | ORAL_TABLET | Freq: Every day | ORAL | 1 refills | Status: DC
  Filled 2023-11-24: qty 30, 30d supply, fill #0

## 2023-11-24 MED ORDER — DIPHENHYDRAMINE HCL 25 MG PO TABS
50.0000 mg | ORAL_TABLET | Freq: Every evening | ORAL | 0 refills | Status: DC | PRN
  Filled 2023-11-24: qty 60, 30d supply, fill #0
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
## 2023-11-24 NOTE — Progress Notes (Signed)
 Patient was very pleasant with this nurse throughout this shift. She was alert and oriented, held conversations with this nurse; joked and laughed with this nurse. Patient did tell this nurse that she did not take the prescribed Zoloft because she had been prescribed that in the past and she did not like the way it made her feel, stating that it made her feel like a zombie. She did endorse however interest in being prescribed medication for her depression, just not Zoloft.

## 2023-11-25 ENCOUNTER — Inpatient Hospital Stay (HOSPITAL_COMMUNITY)
Admission: AD | Admit: 2023-11-25 | Discharge: 2023-11-25 | Disposition: A | Discharge status: Home / Self Care | Payer: MEDICAID | Attending: Obstetrics & Gynecology | Admitting: Obstetrics & Gynecology

## 2023-11-25 ENCOUNTER — Other Ambulatory Visit: Payer: Self-pay

## 2023-11-25 ENCOUNTER — Telehealth: Payer: Self-pay | Admitting: Pharmacy Technician

## 2023-11-25 ENCOUNTER — Encounter (HOSPITAL_COMMUNITY): Payer: Self-pay | Admitting: Obstetrics & Gynecology

## 2023-11-25 ENCOUNTER — Ambulatory Visit: Payer: Self-pay

## 2023-11-25 DIAGNOSIS — Z8759 Personal history of other complications of pregnancy, childbirth and the puerperium: Secondary | ICD-10-CM | POA: Diagnosis not present

## 2023-11-25 DIAGNOSIS — Z3A20 20 weeks gestation of pregnancy: Secondary | ICD-10-CM | POA: Diagnosis not present

## 2023-11-25 DIAGNOSIS — Z0371 Encounter for suspected problem with amniotic cavity and membrane ruled out: Secondary | ICD-10-CM | POA: Diagnosis present

## 2023-11-25 HISTORY — DX: Post-traumatic stress disorder, unspecified: F43.10

## 2023-11-25 LAB — URINALYSIS, ROUTINE W REFLEX MICROSCOPIC
Bilirubin Urine: NEGATIVE
Glucose, UA: NEGATIVE mg/dL
Hgb urine dipstick: NEGATIVE
Ketones, ur: NEGATIVE mg/dL
Leukocytes,Ua: NEGATIVE
Nitrite: NEGATIVE
Protein, ur: NEGATIVE mg/dL
Specific Gravity, Urine: 1.025 (ref 1.005–1.030)
pH: 6 (ref 5.0–8.0)

## 2023-11-25 LAB — RAPID URINE DRUG SCREEN, HOSP PERFORMED
Amphetamines: NOT DETECTED
Barbiturates: NOT DETECTED
Benzodiazepines: POSITIVE — AB
Cocaine: NOT DETECTED
Opiates: NOT DETECTED
Tetrahydrocannabinol: POSITIVE — AB

## 2023-11-25 LAB — WET PREP, GENITAL
Clue Cells Wet Prep HPF POC: NONE SEEN
Sperm: NONE SEEN
Trich, Wet Prep: NONE SEEN
WBC, Wet Prep HPF POC: >=10 — AB (ref <10)
Yeast Wet Prep HPF POC: NONE SEEN

## 2023-11-25 LAB — RUPTURE OF MEMBRANE (ROM)PLUS: Rom Plus: NEGATIVE

## 2023-11-25 MED ORDER — CLONIDINE HCL 0.1 MG PO TABS: 0.1000 mg | ORAL_TABLET | Freq: Two times a day (BID) | ORAL | 0 refills | Status: DC

## 2023-11-25 MED ORDER — LORAZEPAM 1 MG PO TABS
1.0000 mg | ORAL_TABLET | Freq: Once | ORAL | Status: AC
  Administered 2023-11-25: 1 mg via ORAL
  Filled 2023-11-25: qty 1

## 2023-11-25 NOTE — MAU Note (Addendum)
 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 She presents complaining of "pain and tightness in my jaw, 'tachy' feeling like my heart is racing, muscle tightness, and frequent twitching". She also states that she felt two gushes of fluid that she thought was blood, but was just clear fluid. Reports fluid was thick and mucous, not thin and watery. Reports baby was not moving as much as usual last night, but movement has been normal today. Denies ctx and bleeding. Vitals WNL.  After discussion, pt reports that she left [facility] yesterday after being given ativan TID while inpatient. States that she does not have a prescription for any benzodiazepines right now and she is withdrawing. States "It's not the opiates, this is the benzos". Reports that she is taking suboxone as prescribed. She states that she is experiencing extreme anxiety given her withdrawal symptoms, as they are the same that she experienced when she lost a pregnancy at 20 weeks in 2023 which provider told her was due to substance withdrawal.

## 2023-11-25 NOTE — Progress Notes (Signed)
 The discussion with the patient about goals of care and her current benzodiazepine addiction.  The patient reports using Xanax 4 mg every 4-5 hours.  She has been buying this from a dealer for the last several months.  She reports that she typically feels symptoms of benzodiazepine withdrawal within 5 to 6 hours after her last dose. She is feeling better from an opiate withdrawal standpoint. She remains on Suboxone 8mg  BID.   While she was here in the MAU she received a dose of Ativan 1 mg because of her inability to relax in order to do the appropriate workup for rupture of membranes.  When I am interviewing her she is approximately 4 hours from her dose of Ativan (16:04)  She is resting comfortably in the bed her partner Trinna Post is at bedside. She is in no acute distress  Patient reports her goals: Be opiate and benzodiazepine free.  She reports that she knows that if she goes home without benzodiazepine she will likely relapse and her current dealer often offers her additional or cuts his benzodiazepines with other substances such as fentanyl, amphetamines and methamphetamines.  Patient currently does not have custody of her 2 other children as they live with grandmother and grandfather's and 1 child lives with their father.  Patient continues to be involved in their lives to the best of her ability.  She does acknowledge that her multiple addictions do get in the way of her interactions with them.  Patient appears to have a strong motivation stop benzos.  I discussed clearly with the patient that given her admission to the hospital and her lack of delirium tremens she is medically safe to be off benzodiazepines. I discussed that she is medically safe to not be prescribed benzodiazepines.  I validated that her cravings  are strong and become risky due to seeking benzodiazepines through nonprescribed sources puts her at risk for exposure to  other substances.  I have asked that the patient wait  while I to talk to Dr. Vennie Homans about a plan of care.

## 2023-11-25 NOTE — Telephone Encounter (Signed)
 Dr. Crissie Reese,  Patient is un-insured. Mediciad is pending. Patient will need PAP (free drug assistance.)  The forms will be faxed to you for your signature at fax # [phone]. Please sign forms and return to me.  Thanks Selena Batten
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
## 2023-11-26 ENCOUNTER — Encounter (HOSPITAL_COMMUNITY): Payer: Self-pay | Admitting: Family Medicine

## 2023-11-26 LAB — MISC LABCORP TEST (SEND OUT)
LabCorp test name: 912460
Labcorp test code: 9985

## 2023-11-26 NOTE — Telephone Encounter (Signed)
 Dr. Tamera Punt. Will do.   Thanks Selena Batten

## 2023-12-09 ENCOUNTER — Encounter: Payer: Self-pay | Admitting: Family Medicine

## 2023-12-09 ENCOUNTER — Ambulatory Visit (INDEPENDENT_AMBULATORY_CARE_PROVIDER_SITE_OTHER): Payer: MEDICAID | Admitting: Family Medicine

## 2023-12-09 VITALS — BP 108/68 | HR 114 | Wt 120.1 lb

## 2023-12-09 DIAGNOSIS — E538 Deficiency of other specified B group vitamins: Secondary | ICD-10-CM

## 2023-12-09 DIAGNOSIS — F4310 Post-traumatic stress disorder, unspecified: Secondary | ICD-10-CM | POA: Diagnosis not present

## 2023-12-09 DIAGNOSIS — Z3A22 22 weeks gestation of pregnancy: Secondary | ICD-10-CM

## 2023-12-09 DIAGNOSIS — F1910 Other psychoactive substance abuse, uncomplicated: Secondary | ICD-10-CM | POA: Diagnosis not present

## 2023-12-09 DIAGNOSIS — O0992 Supervision of high risk pregnancy, unspecified, second trimester: Secondary | ICD-10-CM | POA: Diagnosis not present

## 2023-12-09 DIAGNOSIS — I5181 Takotsubo syndrome: Secondary | ICD-10-CM | POA: Diagnosis not present

## 2023-12-09 DIAGNOSIS — F1320 Sedative, hypnotic or anxiolytic dependence, uncomplicated: Secondary | ICD-10-CM

## 2023-12-09 DIAGNOSIS — O0990 Supervision of high risk pregnancy, unspecified, unspecified trimester: Secondary | ICD-10-CM

## 2023-12-09 DIAGNOSIS — R9431 Abnormal electrocardiogram [ECG] [EKG]: Secondary | ICD-10-CM

## 2023-12-09 DIAGNOSIS — M791 Myalgia, unspecified site: Secondary | ICD-10-CM

## 2023-12-09 MED ORDER — CYCLOBENZAPRINE HCL 10 MG PO TABS: 10.0000 mg | ORAL_TABLET | Freq: Three times a day (TID) | ORAL | 1 refills | Status: DC | PRN

## 2023-12-09 MED ORDER — PRAZOSIN HCL 1 MG PO CAPS: 1.0000 mg | ORAL_CAPSULE | Freq: Every day | ORAL | 1 refills | Status: DC

## 2023-12-09 MED ORDER — VITAMIN B-12 1000 MCG PO TABS: 1000.0000 ug | ORAL_TABLET | Freq: Every day | ORAL | 3 refills | Status: DC

## 2023-12-09 MED ORDER — BUPRENORPHINE HCL-NALOXONE HCL 8-2 MG SL FILM: 1.0000 | ORAL_FILM | Freq: Three times a day (TID) | SUBLINGUAL | 0 refills | Status: DC

## 2023-12-09 NOTE — Patient Instructions (Signed)

## 2023-12-09 NOTE — Addendum Note (Signed)
 Addended by: Sharlene Dory on: 12/09/2023 04:06 PM   Modules accepted: Level of Service

## 2023-12-09 NOTE — Progress Notes (Signed)
 Subjective:  Gabrielle Nguyen is a 28 y.o. [phone] at [redacted]w[redacted]d being seen today for ongoing prenatal care.  She is currently monitored for the following issues for this high-risk pregnancy and has MDD (major depressive disorder); Panic disorder; Asthma; Anxiety with depression; Opioid use disorder; Hyperemesis affecting pregnancy, antepartum; Nausea/vomiting in pregnancy; Prolonged Q-T interval on ECG; Elevated troponin; Acute systolic CHF (congestive heart failure) (HCC); Takotsubo cardiomyopathy; PTSD (post-traumatic stress disorder); Red Chart Rounds Patient; Cervical insufficiency during pregnancy in second trimester, antepartum; Generalized anxiety disorder; Anemia complicating pregnancy, second trimester; Polysubstance abuse (HCC); Substance induced mood disorder (HCC); Withdrawal from benzodiazepine (HCC); Hypokalemia; Supervision of high risk pregnancy, antepartum; Severe benzodiazepine use disorder (HCC); Opiate withdrawal (HCC); and Vitamin B 12 deficiency on their problem list.  Patient reports  feeling better than last visit. Needs refill on Flexeril. Requesting something to help w/ episodes of "thousand yard stare"   , also having lots of nightmares.  Contractions: Not present. Vag. Bleeding: None.  Movement: Present. Denies leaking of fluid.   The following portions of the patient's history were reviewed and updated as appropriate: allergies, current medications, past family history, past medical history, past social history, past surgical history and problem list. Problem list updated.  Objective:   Vitals:   12/09/23 1506  BP: 108/68  Pulse: (!) 114  Weight: 120 lb 2 oz (54.5 kg)    Fetal Status: Fetal Heart Rate (bpm): 158   Movement: Present     General:  Alert, oriented and cooperative. Patient is in no acute distress.  Skin: Skin is warm and dry. No rash noted.   Cardiovascular: Normal heart rate noted  Respiratory: Normal respiratory effort, no problems with respiration noted   Abdomen: Soft, gravid, appropriate for gestational age. Pain/Pressure: Absent     Pelvic: Vag. Bleeding: None     Cervical exam deferred        Extremities: Normal range of motion.  Edema: None  Mental Status: Normal mood and affect. Normal behavior. Normal judgment and thought content.   Urinalysis:      PDMP not reviewed this encounter.    Last UDS: Lab Results  Component Value Date   CREATIUR 168 10/07/2023                Assessment and Plan:  Pregnancy: [id number] at [redacted]w[redacted]d  1. Supervision of high risk pregnancy, antepartum (Primary) BP and FHR normal Offered AFP, accepts [facility], accepts  2. Polysubstance abuse (HCC) Recently admitted and did not experience DT's or other severe benzo withdrawal affects Seen recently in MAU and again asked plan regarding this, discussed at that time we can not prescribe her them as there has been a pattern of misuse Also during recent admission restarted on Suboxone due to withdrawal symptoms and frequent presence of fentanyl in her UDS Today reports doing well with suboxone but would like to switch from pills to films, new rx sent UDS collected  3. Takotsubo cardiomyopathy TTE done 11/19/2023 during admission, moderately dilated L atrium, otherwise normal Encouraged to reschedule visit with Dr. Gala Romney  4. PTSD (post-traumatic stress disorder) Referred to psych previously Having frequent nightmares, start prazosin 1 mg QHS  5. Prolonged Q-T interval on ECG Last ECG 11/18/2023, QTc was 470  6. Vitamin B 12 deficiency Diagnosed during last admission, level was 170 On review of chart was not given IM injection Will start high dose oral supplement  Preterm labor symptoms and general obstetric precautions including but not limited to vaginal bleeding, contractions,  leaking of fluid and fetal movement were reviewed in detail with the patient. Please refer to After Visit Summary for other counseling recommendations.   Return in 4 weeks (on 01/06/2024) for REACH clinic, ob visit.   Venora Maples, MD
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
## 2023-12-11 LAB — AFP, SERUM, OPEN SPINA BIFIDA
AFP MoM: 2.04
AFP Value: 187.1 ng/mL
Gest. Age on Collection Date: 22.5 wk
Maternal Age At EDD: 29.6 a
OSBR Risk 1 IN: 737
Test Results:: NEGATIVE
Weight: 120 [lb_av]

## 2023-12-12 LAB — TOXASSURE FLEX 15, UR
6-ACETYLMORPHINE IA: NEGATIVE ng/mL
7-aminoclonazepam: NOT DETECTED ng/mg{creat}
AMPHETAMINES IA: NEGATIVE ng/mL
Alpha-hydroxyalprazolam: NOT DETECTED ng/mg{creat}
Alpha-hydroxymidazolam: NOT DETECTED ng/mg{creat}
Alpha-hydroxytriazolam: NOT DETECTED ng/mg{creat}
Alprazolam: NOT DETECTED ng/mg{creat}
BARBITURATES IA: NEGATIVE ng/mL
BUPRENORPHINE: POSITIVE
Benzodiazepines: NEGATIVE
Buprenorphine: 118 ng/mg{creat}
CANNABINOIDS IA: NEGATIVE ng/mL
COCAINE METABOLITE IA: NEGATIVE ng/mL
Clonazepam: NOT DETECTED ng/mg{creat}
Creatinine: 186 mg/dL
Desalkylflurazepam: NOT DETECTED ng/mg{creat}
Desmethyldiazepam: NOT DETECTED ng/mg{creat}
Desmethylflunitrazepam: NOT DETECTED ng/mg{creat}
Diazepam: NOT DETECTED ng/mg{creat}
ETHYL ALCOHOL Enzymatic: NEGATIVE g/dL
FENTANYL: POSITIVE
Fentanyl: NOT DETECTED ng/mg{creat}
Flunitrazepam: NOT DETECTED ng/mg{creat}
Lorazepam: NOT DETECTED ng/mg{creat}
METHADONE IA: NEGATIVE ng/mL
METHADONE MTB IA: NEGATIVE ng/mL
Midazolam: NOT DETECTED ng/mg{creat}
Norbuprenorphine: 377 ng/mg{creat}
Norfentanyl: 31 ng/mg{creat}
OPIATE CLASS IA: NEGATIVE ng/mL
OXYCODONE CLASS IA: NEGATIVE ng/mL
Oxazepam: NOT DETECTED ng/mg{creat}
PHENCYCLIDINE IA: NEGATIVE ng/mL
TAPENTADOL, IA: NEGATIVE ng/mL
TRAMADOL IA: NEGATIVE ng/mL
Temazepam: NOT DETECTED ng/mg{creat}

## 2023-12-15 LAB — PANORAMA PRENATAL TEST FULL PANEL:PANORAMA TEST PLUS 5 ADDITIONAL MICRODELETIONS: FETAL FRACTION: 16.4

## 2023-12-25 ENCOUNTER — Other Ambulatory Visit (HOSPITAL_BASED_OUTPATIENT_CLINIC_OR_DEPARTMENT_OTHER): Payer: Self-pay

## 2023-12-25 ENCOUNTER — Other Ambulatory Visit: Payer: Self-pay

## 2023-12-25 ENCOUNTER — Inpatient Hospital Stay (HOSPITAL_COMMUNITY)
Admission: AD | Admit: 2023-12-25 | Discharge: 2023-12-26 | Disposition: A | Discharge status: Home / Self Care | Payer: MEDICAID | Attending: Emergency Medicine | Admitting: Emergency Medicine

## 2023-12-25 ENCOUNTER — Inpatient Hospital Stay (HOSPITAL_COMMUNITY): Payer: MEDICAID

## 2023-12-25 ENCOUNTER — Encounter (HOSPITAL_COMMUNITY): Payer: Self-pay | Admitting: Obstetrics and Gynecology

## 2023-12-25 DIAGNOSIS — O99512 Diseases of the respiratory system complicating pregnancy, second trimester: Secondary | ICD-10-CM | POA: Insufficient documentation

## 2023-12-25 DIAGNOSIS — J45909 Unspecified asthma, uncomplicated: Secondary | ICD-10-CM | POA: Insufficient documentation

## 2023-12-25 DIAGNOSIS — Z3A25 25 weeks gestation of pregnancy: Secondary | ICD-10-CM | POA: Insufficient documentation

## 2023-12-25 DIAGNOSIS — O99712 Diseases of the skin and subcutaneous tissue complicating pregnancy, second trimester: Secondary | ICD-10-CM | POA: Insufficient documentation

## 2023-12-25 DIAGNOSIS — B9689 Other specified bacterial agents as the cause of diseases classified elsewhere: Secondary | ICD-10-CM | POA: Insufficient documentation

## 2023-12-25 DIAGNOSIS — I5181 Takotsubo syndrome: Secondary | ICD-10-CM | POA: Insufficient documentation

## 2023-12-25 DIAGNOSIS — M7989 Other specified soft tissue disorders: Secondary | ICD-10-CM | POA: Insufficient documentation

## 2023-12-25 DIAGNOSIS — L089 Local infection of the skin and subcutaneous tissue, unspecified: Secondary | ICD-10-CM | POA: Diagnosis not present

## 2023-12-25 DIAGNOSIS — O26892 Other specified pregnancy related conditions, second trimester: Secondary | ICD-10-CM | POA: Diagnosis present

## 2023-12-25 DIAGNOSIS — O99412 Diseases of the circulatory system complicating pregnancy, second trimester: Secondary | ICD-10-CM | POA: Diagnosis not present

## 2023-12-25 NOTE — MAU Note (Signed)
 Gabrielle Nguyen is a 28 y.o. at [redacted]w[redacted]d here in MAU reporting: she's having pain in her left middle finger that shooting up her left under arm that began 2 days ago.  Reports finger is swollen at the knuckle, is able to bend finger.  States her SO has a friend that's a Risk analyst and informed her she needs antibiotic therapy.   Also c/o frightening nightmares, that's causes inability sleep.  Reports hx of PTSD. Denies VB or LOF.  Reports +FM.  LMP: NA Onset of complaint: 2 days ago Pain score: 6 Vitals:   12/25/23 1749  BP: 106/67  Pulse: (!) 113  Resp: 18  Temp: 97.6 F (36.4 C)  SpO2: 100%     FHT: 142 bpm  Lab orders placed from triage: None

## 2023-12-25 NOTE — ED Triage Notes (Signed)
 Pt was seen in MAU and sent to ED for finger swelling to the left middle finger.  Pt has redness that moves up her arm and c/o pain under her arm.

## 2023-12-25 NOTE — MAU Note (Signed)
 Pt transferred to [facility].Reports called to [facility] ED Charge nurse.
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
## 2023-12-26 LAB — CBC WITH DIFFERENTIAL/PLATELET
Abs Immature Granulocytes: 0.03 10*3/uL (ref 0.00–0.07)
Basophils Absolute: 0 10*3/uL (ref 0.0–0.1)
Basophils Relative: 1 %
Eosinophils Absolute: 0.5 10*3/uL (ref 0.0–0.5)
Eosinophils Relative: 10 %
HCT: 31.9 % — ABNORMAL LOW (ref 36.0–46.0)
Hemoglobin: 9.6 g/dL — ABNORMAL LOW (ref 12.0–15.0)
Immature Granulocytes: 1 %
Lymphocytes Relative: 30 %
Lymphs Abs: 1.6 10*3/uL (ref 0.7–4.0)
MCH: 25.3 pg — ABNORMAL LOW (ref 26.0–34.0)
MCHC: 30.1 g/dL (ref 30.0–36.0)
MCV: 84.2 fL (ref 80.0–100.0)
Monocytes Absolute: 0.4 10*3/uL (ref 0.1–1.0)
Monocytes Relative: 7 %
Neutro Abs: 2.8 10*3/uL (ref 1.7–7.7)
Neutrophils Relative %: 51 %
Platelets: 179 10*3/uL (ref 150–400)
RBC: 3.79 MIL/uL — ABNORMAL LOW (ref 3.87–5.11)
RDW: 22.5 % — ABNORMAL HIGH (ref 11.5–15.5)
WBC: 5.4 10*3/uL (ref 4.0–10.5)
nRBC: 0 % (ref 0.0–0.2)

## 2023-12-26 LAB — COMPREHENSIVE METABOLIC PANEL WITH GFR
ALT: 17 U/L (ref 0–44)
AST: 24 U/L (ref 15–41)
Albumin: 2.8 g/dL — ABNORMAL LOW (ref 3.5–5.0)
Alkaline Phosphatase: 48 U/L (ref 38–126)
Anion gap: 11 (ref 5–15)
BUN: 9 mg/dL (ref 6–20)
CO2: 20 mmol/L — ABNORMAL LOW (ref 22–32)
Calcium: 9.1 mg/dL (ref 8.9–10.3)
Chloride: 105 mmol/L (ref 98–111)
Creatinine, Ser: 0.41 mg/dL — ABNORMAL LOW (ref 0.44–1.00)
GFR, Estimated: >60 mL/min (ref >60)
Glucose, Bld: 69 mg/dL — ABNORMAL LOW (ref 70–99)
Potassium: 3.4 mmol/L — ABNORMAL LOW (ref 3.5–5.1)
Sodium: 136 mmol/L (ref 135–145)
Total Bilirubin: 0.3 mg/dL (ref 0.0–1.2)
Total Protein: 6.2 g/dL — ABNORMAL LOW (ref 6.5–8.1)

## 2023-12-26 MED ORDER — SODIUM CHLORIDE 0.9 % IV SOLN
1.0000 g | Freq: Once | INTRAVENOUS | Status: AC
  Administered 2023-12-26: 1 g via INTRAVENOUS
  Filled 2023-12-26: qty 10

## 2023-12-26 MED ORDER — CEPHALEXIN 500 MG PO CAPS: 500.0000 mg | ORAL_CAPSULE | Freq: Four times a day (QID) | ORAL | 0 refills | Status: DC

## 2023-12-26 MED ORDER — ACETAMINOPHEN 325 MG PO TABS
650.0000 mg | ORAL_TABLET | Freq: Once | ORAL | Status: AC
  Administered 2023-12-26: 650 mg via ORAL
  Filled 2023-12-26: qty 2

## 2023-12-26 NOTE — Discharge Instructions (Signed)
 Take the antibiotic as prescribed. Follow up with your primary care provider in 2-3 days for recheck of infection. Return to the ED with any high fever, severe/worsening pain or for new concern.

## 2023-12-26 NOTE — ED Notes (Signed)
 Nt called CCMD @3 :17AM

## 2023-12-26 NOTE — ED Provider Notes (Signed)
 [place] EMERGENCY DEPARTMENT AT [facility] Provider Note   CSN: [id number] Arrival date & time: 12/25/23  1734     History  Chief Complaint  Patient presents with   Finger Pain    Gabrielle Nguyen is a 28 y.o. female.  Patient to ED for evaluation of what started as pain and swelling to the 3rd left finger at the nail where she cut her nail close to the cuticle 2 days ago. Since that time she reports a red streak away from the finger up the arm to the axilla and reports axilla is quite painful. She had a low grade fever at home yesterday. No nausea, vomiting.   The history is provided by the patient. No language interpreter was used.       Home Medications Prior to Admission medications   Medication Sig Start Date End Date Taking? Authorizing Provider  albuterol  (VENTOLIN  HFA) 108 (90 Base) MCG/ACT inhaler Inhale 2 puffs into the lungs every 6 (six) hours as needed for wheezing or shortness of breath. 01/27/22  Yes Gayla Katz, MD  Buprenorphine  HCl-Naloxone  HCl (SUBOXONE ) 8-2 MG FILM Place 1 Film under the tongue 3 (three) times daily. 12/09/23 01/08/24 Yes Teena Feast, MD  cephALEXin  (KEFLEX ) 500 MG capsule Take 1 capsule (500 mg total) by mouth 4 (four) times daily. 12/26/23  Yes Jerid Catherman, Clovis Dar, PA-C  cyanocobalamin  (VITAMIN B12) 1000 MCG tablet Take 1 tablet (1,000 mcg total) by mouth daily. 12/09/23  Yes Teena Feast, MD  cyclobenzaprine  (FLEXERIL ) 10 MG tablet Take 1 tablet (10 mg total) by mouth 3 (three) times daily as needed for muscle spasms. 12/09/23  Yes Teena Feast, MD  diphenhydrAMINE  (BENADRYL ) 25 MG tablet Take 2 tablets (50 mg total) by mouth at bedtime as needed for sleep. 11/24/23  Yes Wendelyn Halter, MD  fluticasone  (FLOVENT  HFA) 110 MCG/ACT inhaler Inhale 1 puff into the lungs in the morning and at bedtime. 09/25/23  Yes Teena Feast, MD  potassium chloride  SA (KLOR-CON  M) 20 MEQ tablet Take 1 tablet (20 mEq total) by mouth 2 (two)  times daily. Patient taking differently: Take 20 mEq by mouth daily. 11/24/23  Yes Wendelyn Halter, MD  prazosin  (MINIPRESS ) 1 MG capsule Take 1 capsule (1 mg total) by mouth at bedtime. 12/09/23  Yes Teena Feast, MD  Prenatal Vit-Fe Phos-FA-Omega (VITAFOL  GUMMIES) 3.33-0.333-34.8 MG CHEW Chew 3 tablets by mouth daily. 09/25/23  Yes Teena Feast, MD  scopolamine  (TRANSDERM-SCOP) 1 MG/3DAYS Place 1 patch (1.5 mg total) onto the skin every 3 (three) days. 09/26/23  Yes Teena Feast, MD  sertraline  (ZOLOFT ) 50 MG tablet Take 1 tablet (50 mg total) by mouth daily. 11/25/23  Yes Wendelyn Halter, MD  naloxone  (NARCAN ) nasal spray 4 mg/0.1 mL Use as needed to reverse opioid overdose Patient not taking: Reported on 12/09/2023 11/19/23   Teena Feast, MD  prochlorperazine  (COMPAZINE ) 10 MG tablet Take 1 tablet (10 mg total) by mouth every 6 (six) hours as needed for nausea or vomiting. Patient not taking: Reported on 12/09/2023 11/19/23   Teena Feast, MD  promethazine  (PHENERGAN ) 25 MG tablet Take 1 tablet (25 mg total) by mouth every 6 (six) hours as needed for nausea or vomiting. Patient not taking: Reported on 12/26/2023 09/26/23   Teena Feast, MD      Allergies    Patient has no known allergies.    Review of Systems   Review of Systems  Physical Exam Updated Vital Signs BP 102/64 (BP Location: Right Arm)   Pulse 92   Temp 97.9 F (36.6 C) (Oral)   Resp 18   Ht 5\' 6"  (1.676 m)   Wt 59.5 kg   SpO2 100%   BMI 21.16 kg/m  Physical Exam Vitals and nursing note reviewed.  Constitutional:      General: She is not in acute distress.    Appearance: She is well-developed. She is not ill-appearing.  Pulmonary:     Effort: Pulmonary effort is normal.  Musculoskeletal:        General: Normal range of motion.     Cervical back: Normal range of motion.     Comments: 3rd left finger swollen at the radial nail. Entire tip of finger is tender. There is red streaking starting  at the distal finger extending to the axilla. No palpable lymphadenopathy.   Skin:    General: Skin is warm and dry.     Findings: Erythema present.  Neurological:     Mental Status: She is alert and oriented to person, place, and time.     ED Results / Procedures / Treatments   Labs (all labs ordered are listed, but only abnormal results are displayed) Labs Reviewed  CBC WITH DIFFERENTIAL/PLATELET - Abnormal; Notable for the following components:      Result Value   RBC 3.79 (*)    Hemoglobin 9.6 (*)    HCT 31.9 (*)    MCH 25.3 (*)    RDW 22.5 (*)    All other components within normal limits  COMPREHENSIVE METABOLIC PANEL WITH GFR - Abnormal; Notable for the following components:   Potassium 3.4 (*)    CO2 20 (*)    Glucose, Bld 69 (*)    Creatinine, Ser 0.41 (*)    Total Protein 6.2 (*)    Albumin 2.8 (*)    All other components within normal limits    EKG None  Radiology DG Hand Complete Left Result Date: 12/25/2023 CLINICAL DATA:  Infection. EXAM: LEFT HAND - COMPLETE 3+ VIEW COMPARISON:  None Available. FINDINGS: There is no evidence of fracture or dislocation. There is no evidence of arthropathy or other focal bone abnormality. There is soft tissue swelling of the distal third finger. No foreign body identified. IMPRESSION: Soft tissue swelling of the distal third finger. No foreign body identified. Electronically Signed   By: Tyron Gallon M.D.   On: 12/25/2023 23:50    Procedures Procedures    Medications Ordered in ED Medications  cefTRIAXone  (ROCEPHIN ) 1 g in sodium chloride  0.9 % 100 mL IVPB (1 g Intravenous New Bag/Given 12/26/23 0253)  acetaminophen  (TYLENOL ) tablet 650 mg (650 mg Oral Given 12/26/23 0254)    ED Course/ Medical Decision Making/ A&P Clinical Course as of 12/26/23 0355  Fri Dec 26, 2023  0231 Patient feeling very anxious with elevated heart rate during treatment, immediately after receiving rocephin . No wheezing, rash, throat swelling.  Doubt allergic rxn. Symptoms resolve quickly. She reports history of anxiety.  [SU]  [id number] Patient to ED with left 3rd finger pain and swelling x 2 days, now with red streaking from the finger to the left axilla, c/w expanding infection. No fever while in ED - reports low grade fever at home. No leukocytosis. IV rocephin  provided, will plan discharge on Keflex .  [SU]  0352 Pad of finger is soft. No considerable pus pocket adjacent to nail. Will treat as cellulitic infection with close recheck by PCP.  [SU]  Clinical Course User Index [SU] Mandy Second, PA-C                                 Medical Decision Making Risk OTC drugs.           Final Clinical Impression(s) / ED Diagnoses Final diagnoses:  Finger infection  [redacted] weeks gestation of pregnancy    Rx / DC Orders ED Discharge Orders          Ordered    cephALEXin  (KEFLEX ) 500 MG capsule  4 times daily        12/26/23 0354              Mandy Second, PA-C 12/26/23 0355    Kelsey Patricia, MD 12/26/23 [phone]

## 2024-01-03 ENCOUNTER — Inpatient Hospital Stay (HOSPITAL_COMMUNITY)
Admission: AD | Admit: 2024-01-03 | Discharge: 2024-01-04 | Discharge status: Against Medical Advice | Payer: MEDICAID | Attending: Obstetrics & Gynecology | Admitting: Obstetrics & Gynecology

## 2024-01-03 ENCOUNTER — Encounter (HOSPITAL_COMMUNITY): Payer: Self-pay | Admitting: Obstetrics & Gynecology

## 2024-01-03 DIAGNOSIS — O99342 Other mental disorders complicating pregnancy, second trimester: Secondary | ICD-10-CM | POA: Diagnosis not present

## 2024-01-03 DIAGNOSIS — Z7951 Long term (current) use of inhaled steroids: Secondary | ICD-10-CM | POA: Diagnosis not present

## 2024-01-03 DIAGNOSIS — O99512 Diseases of the respiratory system complicating pregnancy, second trimester: Secondary | ICD-10-CM | POA: Insufficient documentation

## 2024-01-03 DIAGNOSIS — F411 Generalized anxiety disorder: Secondary | ICD-10-CM | POA: Diagnosis not present

## 2024-01-03 DIAGNOSIS — Z3A26 26 weeks gestation of pregnancy: Secondary | ICD-10-CM | POA: Insufficient documentation

## 2024-01-03 DIAGNOSIS — N96 Recurrent pregnancy loss: Secondary | ICD-10-CM | POA: Insufficient documentation

## 2024-01-03 DIAGNOSIS — O26892 Other specified pregnancy related conditions, second trimester: Secondary | ICD-10-CM | POA: Diagnosis not present

## 2024-01-03 DIAGNOSIS — F4310 Post-traumatic stress disorder, unspecified: Secondary | ICD-10-CM | POA: Diagnosis not present

## 2024-01-03 DIAGNOSIS — Z79899 Other long term (current) drug therapy: Secondary | ICD-10-CM | POA: Insufficient documentation

## 2024-01-03 DIAGNOSIS — J45909 Unspecified asthma, uncomplicated: Secondary | ICD-10-CM | POA: Insufficient documentation

## 2024-01-03 DIAGNOSIS — F1990 Other psychoactive substance use, unspecified, uncomplicated: Secondary | ICD-10-CM

## 2024-01-03 LAB — COMPREHENSIVE METABOLIC PANEL WITH GFR
ALT: 10 U/L (ref 0–44)
AST: 16 U/L (ref 15–41)
Albumin: 3.1 g/dL — ABNORMAL LOW (ref 3.5–5.0)
Alkaline Phosphatase: 49 U/L (ref 38–126)
Anion gap: 8 (ref 5–15)
BUN: 7 mg/dL (ref 6–20)
CO2: 23 mmol/L (ref 22–32)
Calcium: 9.2 mg/dL (ref 8.9–10.3)
Chloride: 105 mmol/L (ref 98–111)
Creatinine, Ser: 0.5 mg/dL (ref 0.44–1.00)
GFR, Estimated: >60 mL/min (ref >60)
Glucose, Bld: 91 mg/dL (ref 70–99)
Potassium: 3.5 mmol/L (ref 3.5–5.1)
Sodium: 136 mmol/L (ref 135–145)
Total Bilirubin: 0.4 mg/dL (ref 0.0–1.2)
Total Protein: 6.7 g/dL (ref 6.5–8.1)

## 2024-01-03 LAB — CBC WITH DIFFERENTIAL/PLATELET
Abs Immature Granulocytes: 0.03 10*3/uL (ref 0.00–0.07)
Basophils Absolute: 0.1 10*3/uL (ref 0.0–0.1)
Basophils Relative: 1 %
Eosinophils Absolute: 0.4 10*3/uL (ref 0.0–0.5)
Eosinophils Relative: 6 %
HCT: 31.2 % — ABNORMAL LOW (ref 36.0–46.0)
Hemoglobin: 9.7 g/dL — ABNORMAL LOW (ref 12.0–15.0)
Immature Granulocytes: 1 %
Lymphocytes Relative: 25 %
Lymphs Abs: 1.6 10*3/uL (ref 0.7–4.0)
MCH: 25 pg — ABNORMAL LOW (ref 26.0–34.0)
MCHC: 31.1 g/dL (ref 30.0–36.0)
MCV: 80.4 fL (ref 80.0–100.0)
Monocytes Absolute: 0.3 10*3/uL (ref 0.1–1.0)
Monocytes Relative: 5 %
Neutro Abs: 4.2 10*3/uL (ref 1.7–7.7)
Neutrophils Relative %: 62 %
Platelets: 190 10*3/uL (ref 150–400)
RBC: 3.88 MIL/uL (ref 3.87–5.11)
RDW: 21.3 % — ABNORMAL HIGH (ref 11.5–15.5)
Smear Review: NORMAL
WBC: 6.6 10*3/uL (ref 4.0–10.5)
nRBC: 0 % (ref 0.0–0.2)

## 2024-01-03 LAB — TSH: TSH: 6.343 u[IU]/mL — ABNORMAL HIGH (ref 0.350–4.500)

## 2024-01-03 LAB — URINALYSIS, ROUTINE W REFLEX MICROSCOPIC
Bilirubin Urine: NEGATIVE
Glucose, UA: NEGATIVE mg/dL
Hgb urine dipstick: NEGATIVE
Ketones, ur: NEGATIVE mg/dL
Leukocytes,Ua: NEGATIVE
Nitrite: NEGATIVE
Protein, ur: NEGATIVE mg/dL
Specific Gravity, Urine: 1.015 (ref 1.005–1.030)
pH: 7 (ref 5.0–8.0)

## 2024-01-03 LAB — MAGNESIUM: Magnesium: 1.8 mg/dL (ref 1.7–2.4)

## 2024-01-03 MED ORDER — POTASSIUM CHLORIDE CRYS ER 20 MEQ PO TBCR
40.0000 meq | EXTENDED_RELEASE_TABLET | Freq: Once | ORAL | Status: AC
  Administered 2024-01-03: 40 meq via ORAL
  Filled 2024-01-03: qty 2

## 2024-01-03 MED ORDER — MAGNESIUM SULFATE 2 GM/50ML IV SOLN
2.0000 g | Freq: Once | INTRAVENOUS | Status: DC
  Filled 2024-01-03: qty 50

## 2024-01-03 MED ORDER — HYDROXYZINE HCL 50 MG PO TABS
50.0000 mg | ORAL_TABLET | Freq: Once | ORAL | Status: AC
  Administered 2024-01-03: 50 mg via ORAL
  Filled 2024-01-03: qty 1

## 2024-01-03 MED ORDER — BUPRENORPHINE HCL-NALOXONE HCL 8-2 MG SL SUBL: 1.0000 | SUBLINGUAL_TABLET | Freq: Once | SUBLINGUAL | Status: DC

## 2024-01-03 MED ORDER — IPRATROPIUM BROMIDE 0.02 % IN SOLN
0.5000 mg | Freq: Once | RESPIRATORY_TRACT | Status: AC
  Administered 2024-01-03: 0.5 mg via RESPIRATORY_TRACT
  Filled 2024-01-03: qty 2.5

## 2024-01-03 MED ORDER — PRAZOSIN HCL 1 MG PO CAPS
1.0000 mg | ORAL_CAPSULE | Freq: Once | ORAL | Status: DC
  Filled 2024-01-03: qty 1

## 2024-01-03 MED ORDER — LEVALBUTEROL HCL 0.63 MG/3ML IN NEBU
0.6300 mg | INHALATION_SOLUTION | Freq: Once | RESPIRATORY_TRACT | Status: AC
Start: 2024-01-03 — End: 2024-01-03
  Administered 2024-01-03: 0.63 mg via RESPIRATORY_TRACT
  Filled 2024-01-03: qty 3

## 2024-01-03 NOTE — MAU Provider Note (Signed)
 Panic Attack    S Gabrielle Nguyen is a 28 y.o. [phone] pregnant female at [redacted]w[redacted]d who presents to MAU today with complaint of three day panic attack. Pt states began having anxiety like symptoms (inability to rest at night) three days ago and she believes this could be due to having the due date approaching for her prior miscarriage as well as her asthma not being controlled.  She states she's been having coughing that's also been keeping her up, having to use her rescue albuterol  inhaler multiple times a day, and reports feeling wheezy today despite the albuterol  PRN tx's today.  She also endorses that has had muscle clenching w/ inability to calm down.  Reports muscles are aching from the clenching including anterior chest discomfort w/ hx of Takotsubo Cardiomyopathy (nrl EF on last echo) and prolonged QT syndrome (last QT 470).  Taking Zoloft  and her Prazosin  for PTSD nightmares. She states the Prazosin  has helped up until the last 72hrs but hasn't noticed a difference with the Zoloft .  She states it feels like she's having to "hold herself back from a full PTSD panic attack" and that it's almost like sleep paralysis. Denies VB, LOF, pain.  Endorses +FM.   Receives care at [facility]. Prenatal records reviewed. 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  She states she's been sober and hasn't had any use since discharge.  She reports more sweating however doesn't appear to be withdrawing her today.  Of note recently seen for cellulitis of finger, now s/p Keflex  course and one time Rocephin  dosing in [facility].   Pertinent items noted in HPI and remainder of comprehensive ROS otherwise negative.   O BP 114/66 (BP Location: Right Arm)   Pulse 90   Temp 98.1 F (36.7 C) (Oral)   Resp 18   Ht 5\' 6"  (1.676 m)   Wt 61.8 kg   SpO2 100%   BMI 21.98 kg/m  Physical Exam Vitals and nursing note reviewed. Exam conducted with a chaperone present.  Constitutional:      General: She is not in acute  distress.    Appearance: Normal appearance. She is normal weight. She is not ill-appearing.  HENT:     Head: Normocephalic and atraumatic.     Right Ear: External ear normal.     Left Ear: External ear normal.     Nose: Nose normal. No congestion.     Mouth/Throat:     Mouth: Mucous membranes are moist.     Pharynx: Oropharynx is clear.  Eyes:     Extraocular Movements: Extraocular movements intact.     Conjunctiva/sclera: Conjunctivae normal.  Cardiovascular:     Rate and Rhythm: Normal rate.     Heart sounds: No murmur heard.    No friction rub. No gallop.  Pulmonary:     Effort: Pulmonary effort is normal. No respiratory distress.     Breath sounds: No stridor. Wheezing (global but not tight) present. No rhonchi or rales.  Chest:     Chest wall: No tenderness.  Abdominal:     General: There is no distension.     Tenderness: There is no abdominal tenderness.     Comments: gravid  Musculoskeletal:        General: No swelling. Normal range of motion.     Cervical back: Normal range of motion.  Skin:    General: Skin is warm and dry.  Neurological:     Mental Status: She is alert and  oriented to person, place, and time. Mental status is at baseline.     Motor: No weakness.     Gait: Gait normal.  Psychiatric:        Behavior: Behavior normal.     Comments: Mildly anxious on exam, though not pressured   NST: ***  EKG: NSR, slight ?LAD given isoelectric AVF and II, unchanged from prior, Qtc 450s   MDM: MAU Course: 2045 Pt with cardiac hx and asthma so will get EKG as well as breathing treatment (levoalbuterol instead of albuterol ).  Will give atarax  given hx of Benzo use disorder.  Will also get basic labs including Mag and TSH.    2051 EKG NSR with slight LAD unchanged from priors and LAD likely given mild LA enlargement on last echo, Qtc 450s  2130 UA noninfectious CMP K 3.5*, reassuring  CBCdiff w/o leukocytosis, Hgb stable 9.7, Plts 190 Mag 1.8*  Given cardiac  hx will give Mg 2g IV and PO K 40meq to get her closer to K 4 and Mg 2.   2220 Pt feeling better in terms of wheeze following breathing treatment.  Pt states however that the Atarax  isn't helping and she doesn't feel safe going home as she's afraid she'll be tempted to try benzo again.  Agreeable with psych consult.  Pt also politely declines IV magnesium  given fear of being stuck again.  Will switch for oral Mag Ox.   ***   AP #[redacted] weeks gestation #  Discharge from MAU in stable condition with strict/usual precautions Follow up at [facility] as scheduled for ongoing prenatal care  Allergies as of 01/03/2024   No Known Allergies   Med Rec must be completed prior to using this [facility]***       Ebony Goldstein, MD 01/03/2024 8:27 PM

## 2024-01-03 NOTE — MAU Note (Signed)
..  Gabrielle Nguyen is a 28 y.o. at [redacted]w[redacted]d here in MAU reporting: for three days has been having an anxiety attack, to where her muscles are clenched and she can not calm down. Reports that her muscles are starting to ache from how much she is clenching. No longer taking anxiety medication because of pregnancy. Takes Prosozen for PTSD, last dose was last night.  Denies vaginal bleeding and leaking of fluid and pain +FM  Pain score: 0/10 Vitals:   01/03/24 2024  BP: 114/66  Pulse: 90  Resp: 18  Temp: 98.1 F (36.7 C)  SpO2: 100%     FHT:150 Lab orders placed from triage: UA

## 2024-01-04 DIAGNOSIS — Z3A26 26 weeks gestation of pregnancy: Secondary | ICD-10-CM

## 2024-01-04 DIAGNOSIS — O26892 Other specified pregnancy related conditions, second trimester: Secondary | ICD-10-CM

## 2024-01-04 DIAGNOSIS — F411 Generalized anxiety disorder: Secondary | ICD-10-CM

## 2024-01-04 MED ORDER — MAGNESIUM OXIDE -MG SUPPLEMENT 400 MG PO CAPS: 400.0000 mg | ORAL_CAPSULE | Freq: Every day | ORAL | 1 refills | Status: AC

## 2024-01-04 MED ORDER — HYDROXYZINE HCL 25 MG PO TABS: 25.0000 mg | ORAL_TABLET | Freq: Four times a day (QID) | ORAL | 2 refills | Status: DC | PRN

## 2024-01-04 MED ORDER — BUDESONIDE-FORMOTEROL FUMARATE 160-4.5 MCG/ACT IN AERO: 2.0000 | INHALATION_SPRAY | Freq: Two times a day (BID) | RESPIRATORY_TRACT | 12 refills | Status: DC

## 2024-01-04 MED ORDER — SERTRALINE HCL 50 MG PO TABS: 75.0000 mg | ORAL_TABLET | Freq: Every day | ORAL | 1 refills | Status: DC
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
## 2024-01-04 NOTE — MAU Note (Signed)
 Back to room to check on patient. Patient and significant other had left the room.

## 2024-01-06 ENCOUNTER — Other Ambulatory Visit: Payer: Self-pay

## 2024-01-06 ENCOUNTER — Ambulatory Visit: Payer: MEDICAID | Admitting: Family Medicine

## 2024-01-06 VITALS — BP 123/86 | HR 108 | Wt 137.8 lb

## 2024-01-06 DIAGNOSIS — F4310 Post-traumatic stress disorder, unspecified: Secondary | ICD-10-CM

## 2024-01-06 DIAGNOSIS — O26892 Other specified pregnancy related conditions, second trimester: Secondary | ICD-10-CM

## 2024-01-06 DIAGNOSIS — F418 Other specified anxiety disorders: Secondary | ICD-10-CM

## 2024-01-06 DIAGNOSIS — I5181 Takotsubo syndrome: Secondary | ICD-10-CM

## 2024-01-06 DIAGNOSIS — F1910 Other psychoactive substance abuse, uncomplicated: Secondary | ICD-10-CM

## 2024-01-06 DIAGNOSIS — J45909 Unspecified asthma, uncomplicated: Secondary | ICD-10-CM

## 2024-01-06 DIAGNOSIS — I5021 Acute systolic (congestive) heart failure: Secondary | ICD-10-CM

## 2024-01-06 DIAGNOSIS — O0990 Supervision of high risk pregnancy, unspecified, unspecified trimester: Secondary | ICD-10-CM

## 2024-01-06 DIAGNOSIS — Z7189 Other specified counseling: Secondary | ICD-10-CM

## 2024-01-06 DIAGNOSIS — O0992 Supervision of high risk pregnancy, unspecified, second trimester: Secondary | ICD-10-CM | POA: Diagnosis not present

## 2024-01-06 DIAGNOSIS — Z3A26 26 weeks gestation of pregnancy: Secondary | ICD-10-CM

## 2024-01-06 DIAGNOSIS — O99012 Anemia complicating pregnancy, second trimester: Secondary | ICD-10-CM

## 2024-01-06 DIAGNOSIS — N898 Other specified noninflammatory disorders of vagina: Secondary | ICD-10-CM

## 2024-01-06 DIAGNOSIS — F1190 Opioid use, unspecified, uncomplicated: Secondary | ICD-10-CM

## 2024-01-06 MED ORDER — PRAZOSIN HCL 2 MG PO CAPS: 2.0000 mg | ORAL_CAPSULE | Freq: Every day | ORAL | 1 refills | Status: DC

## 2024-01-06 MED ORDER — BUPRENORPHINE HCL-NALOXONE HCL 8-2 MG SL FILM
1.0000 | ORAL_FILM | Freq: Three times a day (TID) | SUBLINGUAL | 0 refills | Status: DC
Start: 2024-01-06 — End: 2024-01-20

## 2024-01-06 MED ORDER — FLUCONAZOLE 150 MG PO TABS: 150.0000 mg | ORAL_TABLET | Freq: Once | ORAL | 0 refills | Status: AC

## 2024-01-06 MED ORDER — HYDROXYZINE HCL 50 MG PO TABS: ORAL_TABLET | ORAL | 1 refills | Status: AC

## 2024-01-06 NOTE — Patient Instructions (Signed)
 TDaP Vaccine Pregnancy Get the Whooping Cough Vaccine While You Are Pregnant (CDC)  It is important for women to get the whooping cough vaccine in the third trimester of each pregnancy. Vaccines are the best way to prevent this disease. There are 2 different whooping cough vaccines. Both vaccines combine protection against whooping cough, tetanus and diphtheria, but they are for different age groups: Tdap: for everyone 11 years or older, including pregnant women  DTaP: for children 2 months through 10 years of age  You need the whooping cough vaccine during each of your pregnancies The recommended time to get the shot is during your 27th through 36th week of pregnancy, preferably during the earlier part of this time period. The Centers for Disease Control and Prevention (CDC) recommends that pregnant women receive the whooping cough vaccine for adolescents and adults (called Tdap vaccine) during the third trimester of each pregnancy. The recommended time to get the shot is during your 27th through 36th week of pregnancy, preferably during the earlier part of this time period. This replaces the original recommendation that pregnant women get the vaccine only if they had not previously received it. The [employer] of Obstetricians and Gynecologists and the [employer] support this recommendation.  You should get the whooping cough vaccine while pregnant to pass protection to your baby frame support disabled and/or not supported in this browser  Learn why Gabrielle Nguyen decided to get the whooping cough vaccine in her 3rd trimester of pregnancy and how her baby girl was born with some protection against the disease. Also available on YouTube. After receiving the whooping cough vaccine, your body will create protective antibodies (proteins produced by the body to fight off diseases) and pass some of them to your baby before birth. These antibodies provide your baby some short-term  protection against whooping cough in early life. These antibodies can also protect your baby from some of the more serious complications that come along with whooping cough. Your protective antibodies are at their highest about 2 weeks after getting the vaccine, but it takes time to pass them to your baby. So the preferred time to get the whooping cough vaccine is early in your third trimester. The amount of whooping cough antibodies in your body decreases over time. That is why CDC recommends you get a whooping cough vaccine during each pregnancy. Doing so allows each of your babies to get the greatest number of protective antibodies from you. This means each of your babies will get the best protection possible against this disease.  Getting the whooping cough vaccine while pregnant is better than getting the vaccine after you give birth Whooping cough vaccination during pregnancy is ideal so your baby will have short-term protection as soon as he is born. This early protection is important because your baby will not start getting his whooping cough vaccines until he is 2 months old. These first few months of life are when your baby is at greatest risk for catching whooping cough. This is also when he's at greatest risk for having severe, potentially life-threating complications from the infection. To avoid that gap in protection, it is best to get a whooping cough vaccine during pregnancy. You will then pass protection to your baby before he is born. To continue protecting your baby, he should get whooping cough vaccines starting at 2 months old. You may never have gotten the Tdap vaccine before and did not get it during this pregnancy. If so, you should make sure  to get the vaccine immediately after you give birth, before leaving the hospital or birthing center. It will take about 2 weeks before your body develops protection (antibodies) in response to the vaccine. Once you have protection from the vaccine,  you are less likely to give whooping cough to your newborn while caring for him. But remember, your baby will still be at risk for catching whooping cough from others. A recent study looked to see how effective Tdap was at preventing whooping cough in babies whose mothers got the vaccine while pregnant or in the hospital after giving birth. The study found that getting Tdap between 27 through 36 weeks of pregnancy is 85% more effective at preventing whooping cough in babies younger than 2 months old. Blood tests cannot tell if you need a whooping cough vaccine There are no blood tests that can tell you if you have enough antibodies in your body to protect yourself or your baby against whooping cough. Even if you have been sick with whooping cough in the past or previously received the vaccine, you still should get the vaccine during each pregnancy. Breastfeeding may pass some protective antibodies onto your baby By breastfeeding, you may pass some antibodies you have made in response to the vaccine to your baby. When you get a whooping cough vaccine during your pregnancy, you will have antibodies in your breast milk that you can share with your baby as soon as your milk comes in. However, your baby will not get protective antibodies immediately if you wait to get the whooping cough vaccine until after delivering your baby. This is because it takes about 2 weeks for your body to create antibodies. Learn more about the health benefits of breastfeeding.

## 2024-01-06 NOTE — Addendum Note (Signed)
 Addended by: Felipe Horton, Armetta Henri  on: 01/06/2024 03:48 PM   Modules accepted: Orders

## 2024-01-06 NOTE — Progress Notes (Signed)
 Subjective:  Gabrielle Nguyen is a 28 y.o. [phone] at [redacted]w[redacted]d being seen today for ongoing prenatal care.  She is currently monitored for the following issues for this high-risk pregnancy and has MDD (major depressive disorder); Panic disorder; Asthma; Anxiety with depression; Opioid use disorder; Hyperemesis affecting pregnancy, antepartum; Nausea/vomiting in pregnancy; Prolonged Q-T interval on ECG; Elevated troponin; Acute systolic CHF (congestive heart failure) (HCC); Takotsubo cardiomyopathy; PTSD (post-traumatic stress disorder); Red Chart Rounds Patient; Cervical insufficiency during pregnancy in second trimester, antepartum; Generalized anxiety disorder; Anemia complicating pregnancy, second trimester; Polysubstance abuse (HCC); Substance induced mood disorder (HCC); Withdrawal from benzodiazepine (HCC); Hypokalemia; Supervision of high risk pregnancy, antepartum; Severe benzodiazepine use disorder (HCC); Opiate withdrawal (HCC); and Vitamin B 12 deficiency on their problem list.  Was seen in [facility] 5/3 for "three day panic attack" and asthma exacerbation (frequent albuterol  use may have exacerbated anxiety Sx.) Rx Symbicort to reduce frequent Albuterol  use (has not picked up Rx yet). Zoloft  was increased to 75 QD. OUD Sx stable on Suboxone  8 TID. Last Tox screen showed Fentanyl .   Patient reports  ongoing anxiety Sx and nightmares on Prazosin  1 mg QHS, Atarax  ~50 Q6 and 75 mg Benadryl  at night for sleep.   .  Contractions: Not present. Vag. Bleeding: None.  Movement: Present. Denies leaking of fluid.   The following portions of the patient's history were reviewed and updated as appropriate: allergies, current medications, past family history, past medical history, past social history, past surgical history and problem list. Problem list updated.  Objective:   Vitals:   01/06/24 1345  BP: 123/86  Pulse: (!) 108  Weight: 137 lb 12.8 oz (62.5 kg)    Fetal Status: Fetal Heart Rate (bpm): 140    Movement: Present     General:  Alert, oriented and cooperative. Patient is in no acute distress.  Skin: Skin is warm and dry. No rash noted.   Cardiovascular: Normal heart rate noted  Respiratory: Normal respiratory effort, no problems with respiration noted  Abdomen: Soft, gravid, appropriate for gestational age. Pain/Pressure: Absent     Pelvic: Vag. Bleeding: None     Cervical exam deferred        Extremities: Normal range of motion.  Edema: None  Mental Status: Normal mood and affect. Normal behavior. Normal judgment and thought content.   Urinalysis:      PDMP reviewed during this encounter.   Last UDS: Lab Results  Component Value Date   CREATIUR 186 12/09/2023     Assessment and Plan:  Pregnancy: [id number] at [redacted]w[redacted]d  1. Takotsubo cardiomyopathy (Primary) - US  MFM OB FOLLOW UP; Future - Last Echo March 2025 65-70% - Encouraged to reschedule visit with Dr. Julane Ny ASAP   2. Acute systolic CHF (congestive heart failure) (HCC) - US  MFM OB FOLLOW UP; Future - See above  3. Uncomplicated asthma, unspecified asthma severity, unspecified whether persistent - Encouraged to Fill and start Symbicort to reduce albuterol  use and improve Sx. Discussed possible relationship w/ worsening anxiety Sx.   4. Supervision of high risk pregnancy, antepartum - US  MFM OB FOLLOW UP; Future  5. [redacted] weeks gestation of pregnancy - US  MFM OB FOLLOW UP; Future - TDaP at [place]. - 28 week labs NV - F/U 2 weeks   6. Anxiety with depression - Increased Atarax . May use 100 mg QHS and stop benadryl . hydrOXYzine  (ATARAX ) 50 MG tablet; 50 mg (1 tablet) every 6 hours as needed and 100 mg (2 tablets) at bedtime  Dispense: 120  tablet; Refill: 1 - Encouraged to make appt w/ BH  7. Opioid use disorder - ToxAssure Flex 15, Ur - Buprenorphine  HCl-Naloxone  HCl (SUBOXONE ) 8-2 MG FILM; Place 1 Film under the tongue 3 (three) times daily.  Dispense: 90 Film; Refill: 0 - US  MFM OB FOLLOW UP; Future  8.  Anemia complicating pregnancy, second trimester - US  MFM OB FOLLOW UP; Future  9. PTSD (post-traumatic stress disorder) - Increase prazosin  (MINIPRESS ) 2 MG capsule; Take 1 capsule (2 mg total) by mouth at bedtime.  Dispense: 30 capsule; Refill: 1  10. Polysubstance abuse (HCC) - Buprenorphine  HCl-Naloxone  HCl (SUBOXONE ) 8-2 MG FILM; Place 1 Film under the tongue 3 (three) times daily.  Dispense: 90 Film; Refill: 0   Preterm labor symptoms and general obstetric precautions including but not limited to vaginal bleeding, contractions, leaking of fluid and fetal movement were reviewed in detail with the patient. Please refer to After Visit Summary for other counseling recommendations.   Future Appointments  Date Time Provider Department Center  01/27/2024  3:00 PM [facility] PROVIDER 1 [facility]  01/27/2024  3:30 PM [facility]6 [facility]     Avett Reineck , CNM

## 2024-01-11 LAB — TOXASSURE FLEX 15, UR
6-ACETYLMORPHINE IA: NEGATIVE ng/mL
7-aminoclonazepam: NOT DETECTED ng/mg{creat}
AMPHETAMINES IA: NEGATIVE ng/mL
Alpha-hydroxyalprazolam: NOT DETECTED ng/mg{creat}
Alpha-hydroxymidazolam: NOT DETECTED ng/mg{creat}
Alpha-hydroxytriazolam: NOT DETECTED ng/mg{creat}
Alprazolam: NOT DETECTED ng/mg{creat}
BARBITURATES IA: NEGATIVE ng/mL
BUPRENORPHINE: POSITIVE
Benzodiazepines: NEGATIVE
Buprenorphine: >446 ng/mg{creat}
COCAINE METABOLITE IA: NEGATIVE ng/mL
Clonazepam: NOT DETECTED ng/mg{creat}
Creatinine: 224 mg/dL (ref >=20)
Desalkylflurazepam: NOT DETECTED ng/mg{creat}
Desmethyldiazepam: NOT DETECTED ng/mg{creat}
Desmethylflunitrazepam: NOT DETECTED ng/mg{creat}
Diazepam: NOT DETECTED ng/mg{creat}
ETHYL ALCOHOL Enzymatic: NEGATIVE g/dL
FENTANYL: NEGATIVE
Fentanyl: NOT DETECTED ng/mg{creat}
Flunitrazepam: NOT DETECTED ng/mg{creat}
Lorazepam: NOT DETECTED ng/mg{creat}
METHADONE IA: NEGATIVE ng/mL
METHADONE MTB IA: NEGATIVE ng/mL
Midazolam: NOT DETECTED ng/mg{creat}
Norbuprenorphine: >446 ng/mg{creat}
Norfentanyl: NOT DETECTED ng/mg{creat}
OPIATE CLASS IA: NEGATIVE ng/mL
OXYCODONE CLASS IA: NEGATIVE ng/mL
Oxazepam: NOT DETECTED ng/mg{creat}
PHENCYCLIDINE IA: NEGATIVE ng/mL
TAPENTADOL, IA: NEGATIVE ng/mL
TRAMADOL IA: NEGATIVE ng/mL
Temazepam: NOT DETECTED ng/mg{creat}

## 2024-01-11 LAB — CANNABINOIDS, MS, UR RFX
Cannabinoids Confirmation: POSITIVE
Carboxy-THC: 8 ng/mg{creat}

## 2024-01-13 ENCOUNTER — Other Ambulatory Visit: Payer: Self-pay

## 2024-01-13 DIAGNOSIS — Z3A27 27 weeks gestation of pregnancy: Secondary | ICD-10-CM

## 2024-01-16 ENCOUNTER — Other Ambulatory Visit: Payer: Self-pay | Admitting: Family Medicine

## 2024-01-16 ENCOUNTER — Other Ambulatory Visit: Payer: MEDICAID

## 2024-01-16 DIAGNOSIS — M791 Myalgia, unspecified site: Secondary | ICD-10-CM

## 2024-01-20 ENCOUNTER — Other Ambulatory Visit: Payer: Self-pay | Admitting: Advanced Practice Midwife

## 2024-01-20 ENCOUNTER — Ambulatory Visit (INDEPENDENT_AMBULATORY_CARE_PROVIDER_SITE_OTHER): Payer: MEDICAID | Admitting: Family Medicine

## 2024-01-20 VITALS — BP 101/67 | HR 95 | Wt 140.8 lb

## 2024-01-20 DIAGNOSIS — F1190 Opioid use, unspecified, uncomplicated: Secondary | ICD-10-CM | POA: Diagnosis not present

## 2024-01-20 DIAGNOSIS — O0993 Supervision of high risk pregnancy, unspecified, third trimester: Secondary | ICD-10-CM | POA: Diagnosis not present

## 2024-01-20 DIAGNOSIS — O210 Mild hyperemesis gravidarum: Secondary | ICD-10-CM

## 2024-01-20 DIAGNOSIS — F1910 Other psychoactive substance abuse, uncomplicated: Secondary | ICD-10-CM

## 2024-01-20 DIAGNOSIS — I5181 Takotsubo syndrome: Secondary | ICD-10-CM

## 2024-01-20 DIAGNOSIS — O0990 Supervision of high risk pregnancy, unspecified, unspecified trimester: Secondary | ICD-10-CM

## 2024-01-20 DIAGNOSIS — R7989 Other specified abnormal findings of blood chemistry: Secondary | ICD-10-CM

## 2024-01-20 DIAGNOSIS — F418 Other specified anxiety disorders: Secondary | ICD-10-CM | POA: Diagnosis not present

## 2024-01-20 DIAGNOSIS — I5021 Acute systolic (congestive) heart failure: Secondary | ICD-10-CM

## 2024-01-20 DIAGNOSIS — E538 Deficiency of other specified B group vitamins: Secondary | ICD-10-CM

## 2024-01-20 DIAGNOSIS — Z3A28 28 weeks gestation of pregnancy: Secondary | ICD-10-CM

## 2024-01-20 MED ORDER — ONDANSETRON 8 MG PO TBDP: 8.0000 mg | ORAL_TABLET | Freq: Three times a day (TID) | ORAL | 1 refills | Status: DC | PRN

## 2024-01-20 MED ORDER — BUPRENORPHINE HCL-NALOXONE HCL 8-2 MG SL FILM: 1.0000 | ORAL_FILM | Freq: Four times a day (QID) | SUBLINGUAL | 0 refills | Status: DC
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
## 2024-01-20 NOTE — Progress Notes (Signed)
 Subjective:  Gabrielle Nguyen is a 28 y.o. [phone] at [redacted]w[redacted]d being seen today for ongoing prenatal care.  She is currently monitored for the following issues for this high-risk pregnancy and has MDD (major depressive disorder); Panic disorder; Asthma; Anxiety with depression; Opioid use disorder; Hyperemesis affecting pregnancy, antepartum; Nausea/vomiting in pregnancy; Prolonged Q-T interval on ECG; Elevated troponin; Acute systolic CHF (congestive heart failure) (HCC); Takotsubo cardiomyopathy; PTSD (post-traumatic stress disorder); Red Chart Rounds Patient; Generalized anxiety disorder; Anemia complicating pregnancy, second trimester; Polysubstance abuse (HCC); Substance induced mood disorder (HCC); Withdrawal from benzodiazepine (HCC); Hypokalemia; Supervision of high risk pregnancy, antepartum; Severe benzodiazepine use disorder (HCC); Opiate withdrawal (HCC); and Vitamin B 12 deficiency on their problem list.  Patient reports no complaints.  Contractions: Not present. Vag. Bleeding: None.  Movement: Present. Denies leaking of fluid.   The following portions of the patient's history were reviewed and updated as appropriate: allergies, current medications, past family history, past medical history, past social history, past surgical history and problem list. Problem list updated.  Objective:   Vitals:   01/20/24 1411  BP: 101/67  Pulse: 95  Weight: 140 lb 12.8 oz (63.9 kg)    Fetal Status: Fetal Heart Rate (bpm): 155   Movement: Present     General:  Alert, oriented and cooperative. Patient is in no acute distress.  Skin: Skin is warm and dry. No rash noted.   Cardiovascular: Normal heart rate noted  Respiratory: Normal respiratory effort, no problems with respiration noted  Abdomen: Soft, gravid, appropriate for gestational age. Pain/Pressure: Absent     Pelvic: Vag. Bleeding: None     Cervical exam deferred        Extremities: Normal range of motion.  Edema: None  Mental Status:  Normal mood and affect. Normal behavior. Normal judgment and thought content.   Urinalysis:      PDMP not reviewed this encounter.   Last UDS: Lab Results  Component Value Date   CREATIUR 224 01/06/2024     Assessment and Plan:  Pregnancy: [id number] at [redacted]w[redacted]d  1. Opioid use disorder (Primary) - ToxAssure Flex 15, Ur - Antenatal testing per MFM - Growth US  5/27 2. Polysubstance abuse (HCC) - ToxAssure Flex 15, Ur  3. Anxiety with depression - Scheduled  - T3 - T4, free - TSH - Anti-TPO Ab (RDL)  4. Supervision of high risk pregnancy, antepartum  5. Vitamin B 12 deficiency  6. Hyperemesis affecting pregnancy, antepartum - TSH  7. Takotsubo cardiomyopathy - Referred to Cards. Will call to reschedule.  8. Acute systolic CHF (congestive heart failure) (HCC) - Referred to Cards 9. [redacted] weeks gestation of pregnancy - Glucose tolerance scheduled 5/22 - RPR - HIV antibody (with reflex)  10. Elevated TSH - T3 - T4, free - TSH - Anti-TPO Ab (RDL)   Preterm labor symptoms and general obstetric precautions including but not limited to vaginal bleeding, contractions, leaking of fluid and fetal movement were reviewed in detail with the patient. Please refer to After Visit Summary for other counseling recommendations.   No follow-ups on file.   Future Appointments  Date Time Provider Department Center  01/22/2024  9:10 AM [facility]  01/27/2024  3:00 PM [facility] PROVIDER 1 [facility]  01/27/2024  3:30 PM [facility] US6 [facility]    Total face-to-face time with patient: 20 minutes.  Over 50% of encounter was spent on counseling and coordination of care.   Atilano Covelli  Benard Brackett 01/20/2024 2:39 PM Center for [employer]  [facility], [facility]

## 2024-01-21 LAB — HEMOGLOBIN A1C
Est. average glucose Bld gHb Est-mCnc: 100 mg/dL
Hgb A1c MFr Bld: 5.1 % (ref 4.8–5.6)

## 2024-01-21 LAB — RPR: RPR Ser Ql: NONREACTIVE

## 2024-01-21 LAB — T4, FREE: Free T4: 0.8 ng/dL — ABNORMAL LOW (ref 0.82–1.77)

## 2024-01-21 LAB — TSH: TSH: 2.18 u[IU]/mL (ref 0.450–4.500)

## 2024-01-21 LAB — T3: T3, Total: 215 ng/dL — ABNORMAL HIGH (ref 71–180)

## 2024-01-21 LAB — HIV ANTIBODY (ROUTINE TESTING W REFLEX): HIV Screen 4th Generation wRfx: NONREACTIVE

## 2024-01-22 ENCOUNTER — Other Ambulatory Visit: Payer: MEDICAID

## 2024-01-22 DIAGNOSIS — Z3A27 27 weeks gestation of pregnancy: Secondary | ICD-10-CM

## 2024-01-24 LAB — TOXASSURE FLEX 15, UR
6-ACETYLMORPHINE IA: NEGATIVE ng/mL
7-aminoclonazepam: NOT DETECTED ng/mg{creat}
AMPHETAMINES IA: NEGATIVE ng/mL
Alpha-hydroxyalprazolam: NOT DETECTED ng/mg{creat}
Alpha-hydroxymidazolam: NOT DETECTED ng/mg{creat}
Alpha-hydroxytriazolam: NOT DETECTED ng/mg{creat}
Alprazolam: NOT DETECTED ng/mg{creat}
BARBITURATES IA: NEGATIVE ng/mL
BUPRENORPHINE: POSITIVE
Benzodiazepines: NEGATIVE
Buprenorphine: 84 ng/mg{creat}
COCAINE METABOLITE IA: NEGATIVE ng/mL
Clonazepam: NOT DETECTED ng/mg{creat}
Creatinine: 141 mg/dL (ref >=20)
Desalkylflurazepam: NOT DETECTED ng/mg{creat}
Desmethyldiazepam: NOT DETECTED ng/mg{creat}
Desmethylflunitrazepam: NOT DETECTED ng/mg{creat}
Diazepam: NOT DETECTED ng/mg{creat}
ETHYL ALCOHOL Enzymatic: NEGATIVE g/dL
FENTANYL: NEGATIVE
Fentanyl: NOT DETECTED ng/mg{creat}
Flunitrazepam: NOT DETECTED ng/mg{creat}
Lorazepam: NOT DETECTED ng/mg{creat}
METHADONE IA: NEGATIVE ng/mL
METHADONE MTB IA: NEGATIVE ng/mL
Midazolam: NOT DETECTED ng/mg{creat}
Norbuprenorphine: >709 ng/mg{creat}
Norfentanyl: NOT DETECTED ng/mg{creat}
OPIATE CLASS IA: NEGATIVE ng/mL
OXYCODONE CLASS IA: NEGATIVE ng/mL
Oxazepam: NOT DETECTED ng/mg{creat}
PHENCYCLIDINE IA: NEGATIVE ng/mL
TAPENTADOL, IA: NEGATIVE ng/mL
TRAMADOL IA: NEGATIVE ng/mL
Temazepam: NOT DETECTED ng/mg{creat}

## 2024-01-24 LAB — CANNABINOIDS, MS, UR RFX
Cannabinoids Confirmation: POSITIVE
Carboxy-THC: 26 ng/mg{creat}

## 2024-01-27 ENCOUNTER — Ambulatory Visit: Payer: MEDICAID

## 2024-01-27 ENCOUNTER — Encounter: Payer: Self-pay | Admitting: Advanced Practice Midwife

## 2024-01-27 ENCOUNTER — Ambulatory Visit: Payer: Self-pay | Admitting: Advanced Practice Midwife

## 2024-01-27 DIAGNOSIS — R7989 Other specified abnormal findings of blood chemistry: Secondary | ICD-10-CM

## 2024-01-27 HISTORY — DX: Other specified abnormal findings of blood chemistry: R79.89

## 2024-01-28 ENCOUNTER — Telehealth: Payer: Self-pay | Admitting: Family Medicine

## 2024-01-28 MED ORDER — BUDESONIDE-FORMOTEROL FUMARATE 160-4.5 MCG/ACT IN AERO: 2.0000 | INHALATION_SPRAY | Freq: Two times a day (BID) | RESPIRATORY_TRACT | 12 refills | Status: AC

## 2024-01-28 MED ORDER — ALBUTEROL SULFATE HFA 108 (90 BASE) MCG/ACT IN AERS: 2.0000 | INHALATION_SPRAY | Freq: Four times a day (QID) | RESPIRATORY_TRACT | 2 refills | Status: AC | PRN

## 2024-01-28 NOTE — Addendum Note (Signed)
 Addended by: Cherylee Rawlinson on: 01/28/2024 01:07 PM   Modules accepted: Orders

## 2024-01-28 NOTE — Telephone Encounter (Signed)
 New rx sent for albuterol  and symbicort  to [facility] on [address]

## 2024-01-28 NOTE — Telephone Encounter (Signed)
 Patient says they were out of town and were not able to pick up her prescription for an emergency inhaler and the pharmacy is now saying that it is not available. The patient wants to know if she can have another one sent in asap because she has been wheezing all night.

## 2024-01-29 ENCOUNTER — Ambulatory Visit: Payer: MEDICAID | Attending: Obstetrics and Gynecology | Admitting: Obstetrics

## 2024-01-29 ENCOUNTER — Ambulatory Visit (HOSPITAL_BASED_OUTPATIENT_CLINIC_OR_DEPARTMENT_OTHER): Payer: MEDICAID

## 2024-01-29 ENCOUNTER — Other Ambulatory Visit: Payer: Self-pay | Admitting: *Deleted

## 2024-01-29 VITALS — BP 108/56 | HR 124

## 2024-01-29 DIAGNOSIS — F1910 Other psychoactive substance abuse, uncomplicated: Secondary | ICD-10-CM

## 2024-01-29 DIAGNOSIS — I5181 Takotsubo syndrome: Secondary | ICD-10-CM | POA: Insufficient documentation

## 2024-01-29 DIAGNOSIS — Z3A30 30 weeks gestation of pregnancy: Secondary | ICD-10-CM | POA: Diagnosis not present

## 2024-01-29 DIAGNOSIS — O99013 Anemia complicating pregnancy, third trimester: Secondary | ICD-10-CM | POA: Insufficient documentation

## 2024-01-29 DIAGNOSIS — O0990 Supervision of high risk pregnancy, unspecified, unspecified trimester: Secondary | ICD-10-CM

## 2024-01-29 DIAGNOSIS — F1120 Opioid dependence, uncomplicated: Secondary | ICD-10-CM

## 2024-01-29 DIAGNOSIS — F1190 Opioid use, unspecified, uncomplicated: Secondary | ICD-10-CM | POA: Diagnosis not present

## 2024-01-29 DIAGNOSIS — J45909 Unspecified asthma, uncomplicated: Secondary | ICD-10-CM | POA: Insufficient documentation

## 2024-01-29 DIAGNOSIS — O99323 Drug use complicating pregnancy, third trimester: Secondary | ICD-10-CM | POA: Insufficient documentation

## 2024-01-29 DIAGNOSIS — O0993 Supervision of high risk pregnancy, unspecified, third trimester: Secondary | ICD-10-CM | POA: Insufficient documentation

## 2024-01-29 DIAGNOSIS — O99413 Diseases of the circulatory system complicating pregnancy, third trimester: Secondary | ICD-10-CM | POA: Insufficient documentation

## 2024-01-29 DIAGNOSIS — O99012 Anemia complicating pregnancy, second trimester: Secondary | ICD-10-CM

## 2024-01-29 DIAGNOSIS — O99513 Diseases of the respiratory system complicating pregnancy, third trimester: Secondary | ICD-10-CM | POA: Insufficient documentation

## 2024-01-29 DIAGNOSIS — I5021 Acute systolic (congestive) heart failure: Secondary | ICD-10-CM | POA: Diagnosis not present

## 2024-01-29 DIAGNOSIS — O0933 Supervision of pregnancy with insufficient antenatal care, third trimester: Secondary | ICD-10-CM | POA: Insufficient documentation

## 2024-01-29 DIAGNOSIS — I429 Cardiomyopathy, unspecified: Secondary | ICD-10-CM

## 2024-01-29 DIAGNOSIS — Z3A26 26 weeks gestation of pregnancy: Secondary | ICD-10-CM

## 2024-01-29 NOTE — Progress Notes (Signed)
 MFM Consult Note  Gabrielle Nguyen is currently at 30 weeks and 0 days.  She was seen due to a history of maternal drug use and history of maternal heart disease (Takotsubo cardiomyopathy).  She is currently treated with Suboxone  replacement therapy.  She had a cell free DNA test drawn earlier in her pregnancy which indicated a low risk for trisomy 71, 6, and 13.  A female fetus is predicted.  On today's exam, the overall EFW of 3 pounds 10 ounces measures at the 68th percentile for her gestational age.    There was normal amniotic fluid noted with a total AFI of 17.35 cm.  The views of the fetal anatomy were limited today due to her advanced gestational age.  What was visualized today appeared within normal limits.    The limitations of ultrasound in the detection of all anomalies was discussed.  The patient reports that she has a follow-up appointment with her cardiologist Dr. Bensimhon soon.  Due to maternal drug use, we will start weekly fetal testing at 34 weeks.    She will return in 4 weeks for a BPP and growth scan.    As the patient reports that she has a history of rapid deliveries, an induction of labor may be considered at around 39 weeks.    The patient stated that all of her questions were answered today.  A total of 30 minutes was spent counseling and coordinating the care for this patient.  Greater than 50% of the time was spent in direct face-to-face contact.

## 2024-01-30 LAB — ANTI-TPO AB (RDL): Anti-TPO Ab (RDL): <9 [IU]/mL (ref <9.0)

## 2024-02-03 ENCOUNTER — Encounter: Payer: MEDICAID | Admitting: Advanced Practice Midwife

## 2024-02-03 ENCOUNTER — Telehealth: Payer: Self-pay

## 2024-02-03 NOTE — Telephone Encounter (Signed)
 Did not keep appt during RE[facility] clinic today. Called to follow-up; VM left.

## 2024-02-09 ENCOUNTER — Other Ambulatory Visit: Payer: MEDICAID

## 2024-02-09 ENCOUNTER — Ambulatory Visit: Payer: MEDICAID

## 2024-02-09 ENCOUNTER — Telehealth (HOSPITAL_COMMUNITY): Payer: Self-pay

## 2024-02-09 NOTE — Telephone Encounter (Signed)
 Called to confirm/remind patient of their appointment at the [facility] on 02/10/2024 2:00.   Appointment:   [] Confirmed  [x] Left mess   [] No answer/No voice mail  [] VM Full/unable to leave message  [] Phone not in service  Patient reminded to bring all medications and/or complete list.  Confirmed patient has transportation. Gave directions, instructed to utilize valet parking.

## 2024-02-10 ENCOUNTER — Ambulatory Visit (HOSPITAL_COMMUNITY)
Admission: RE | Admit: 2024-02-10 | Discharge: 2024-02-10 | Disposition: A | Discharge status: Home / Self Care | Payer: MEDICAID | Source: Ambulatory Visit | Attending: Physician Assistant | Admitting: Physician Assistant

## 2024-02-10 ENCOUNTER — Encounter (HOSPITAL_COMMUNITY): Payer: Self-pay

## 2024-02-10 VITALS — BP 110/60 | HR 102 | Ht 66.0 in | Wt 150.6 lb

## 2024-02-10 DIAGNOSIS — O0993 Supervision of high risk pregnancy, unspecified, third trimester: Secondary | ICD-10-CM | POA: Insufficient documentation

## 2024-02-10 DIAGNOSIS — I5022 Chronic systolic (congestive) heart failure: Secondary | ICD-10-CM | POA: Insufficient documentation

## 2024-02-10 DIAGNOSIS — Z8679 Personal history of other diseases of the circulatory system: Secondary | ICD-10-CM | POA: Diagnosis not present

## 2024-02-10 DIAGNOSIS — Z87898 Personal history of other specified conditions: Secondary | ICD-10-CM

## 2024-02-10 DIAGNOSIS — Z79899 Other long term (current) drug therapy: Secondary | ICD-10-CM | POA: Insufficient documentation

## 2024-02-10 DIAGNOSIS — R9431 Abnormal electrocardiogram [ECG] [EKG]: Secondary | ICD-10-CM | POA: Insufficient documentation

## 2024-02-10 DIAGNOSIS — F419 Anxiety disorder, unspecified: Secondary | ICD-10-CM | POA: Diagnosis not present

## 2024-02-10 DIAGNOSIS — Z3A31 31 weeks gestation of pregnancy: Secondary | ICD-10-CM | POA: Insufficient documentation

## 2024-02-10 DIAGNOSIS — F4310 Post-traumatic stress disorder, unspecified: Secondary | ICD-10-CM

## 2024-02-10 DIAGNOSIS — F1911 Other psychoactive substance abuse, in remission: Secondary | ICD-10-CM | POA: Diagnosis not present

## 2024-02-10 DIAGNOSIS — F32A Depression, unspecified: Secondary | ICD-10-CM | POA: Insufficient documentation

## 2024-02-10 DIAGNOSIS — O0990 Supervision of high risk pregnancy, unspecified, unspecified trimester: Secondary | ICD-10-CM

## 2024-02-10 MED ORDER — METOPROLOL SUCCINATE ER 25 MG PO TB24: 25.0000 mg | ORAL_TABLET | Freq: Every evening | ORAL | 3 refills | Status: DC

## 2024-02-10 NOTE — Patient Instructions (Signed)
 START Toprol  25 mg nightly.  Your physician has requested that you have an echocardiogram. Echocardiography is a painless test that uses sound waves to create images of your heart. It provides your doctor with information about the size and shape of your heart and how well your heart's chambers and valves are working. This procedure takes approximately one hour. There are no restrictions for this procedure. Please do NOT wear cologne, perfume, aftershave, or lotions (deodorant is allowed). Please arrive 15 minutes prior to your appointment time.  Please note: We ask at that you not bring children with you during ultrasound (echo/ vascular) testing. Due to room size and safety concerns, children are not allowed in the ultrasound rooms during exams. Our front office staff cannot provide observation of children in our lobby area while testing is being conducted. An adult accompanying a patient to their appointment will only be allowed in the ultrasound room at the discretion of the ultrasound technician under special circumstances. We apologize for any inconvenience.\  Your physician recommends that you schedule a follow-up appointment in: as scheduled  If you have any questions or concerns before your next appointment please send us  a message through [facility] or call our office at [phone].    TO LEAVE A MESSAGE FOR THE NURSE SELECT OPTION 2, PLEASE LEAVE A MESSAGE INCLUDING: YOUR NAME DATE OF BIRTH CALL BACK NUMBER REASON FOR CALL**this is important as we prioritize the call backs  YOU WILL RECEIVE A CALL BACK THE SAME DAY AS LONG AS YOU CALL BEFORE 4:00 PM  At the [facility], you and your health needs are our priority. As part of our continuing mission to provide you with exceptional heart care, we have created designated Provider Care Teams. These Care Teams include your primary Cardiologist (physician) and Advanced Practice Providers (APPs- Physician Assistants and Nurse  Practitioners) who all work together to provide you with the care you need, when you need it.   You may see any of the following providers on your designated Care Team at your next follow up: Dr Jules Oar Dr Peder Bourdon Dr. Alwin Baars Dr. Arta Lark Amy Marijane Shoulders, NP Ruddy Corral, [place] [facility] [place], [place] Dennise Fitz, NP [place] Lee, NP Shawnee Dellen, NP Luster Salters, PharmD Bevely Brush, PharmD   Please be sure to bring in all your medications bottles to every appointment.    Thank you for choosing [facility] \
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
## 2024-02-12 ENCOUNTER — Other Ambulatory Visit: Payer: Self-pay | Admitting: Family Medicine

## 2024-02-12 DIAGNOSIS — F4310 Post-traumatic stress disorder, unspecified: Secondary | ICD-10-CM

## 2024-02-17 ENCOUNTER — Encounter: Payer: MEDICAID | Admitting: Family Medicine

## 2024-02-19 ENCOUNTER — Other Ambulatory Visit: Payer: Self-pay

## 2024-02-19 ENCOUNTER — Ambulatory Visit (INDEPENDENT_AMBULATORY_CARE_PROVIDER_SITE_OTHER): Payer: MEDICAID | Admitting: Family Medicine

## 2024-02-19 VITALS — BP 105/66 | HR 139 | Wt 146.6 lb

## 2024-02-19 DIAGNOSIS — F4310 Post-traumatic stress disorder, unspecified: Secondary | ICD-10-CM | POA: Diagnosis not present

## 2024-02-19 DIAGNOSIS — Z7189 Other specified counseling: Secondary | ICD-10-CM

## 2024-02-19 DIAGNOSIS — O99013 Anemia complicating pregnancy, third trimester: Secondary | ICD-10-CM

## 2024-02-19 DIAGNOSIS — F1910 Other psychoactive substance abuse, uncomplicated: Secondary | ICD-10-CM

## 2024-02-19 DIAGNOSIS — M791 Myalgia, unspecified site: Secondary | ICD-10-CM

## 2024-02-19 DIAGNOSIS — I5181 Takotsubo syndrome: Secondary | ICD-10-CM | POA: Diagnosis not present

## 2024-02-19 DIAGNOSIS — F1190 Opioid use, unspecified, uncomplicated: Secondary | ICD-10-CM

## 2024-02-19 DIAGNOSIS — Z3A33 33 weeks gestation of pregnancy: Secondary | ICD-10-CM | POA: Diagnosis not present

## 2024-02-19 DIAGNOSIS — O99283 Endocrine, nutritional and metabolic diseases complicating pregnancy, third trimester: Secondary | ICD-10-CM

## 2024-02-19 DIAGNOSIS — D649 Anemia, unspecified: Secondary | ICD-10-CM

## 2024-02-19 DIAGNOSIS — O99343 Other mental disorders complicating pregnancy, third trimester: Secondary | ICD-10-CM | POA: Diagnosis not present

## 2024-02-19 DIAGNOSIS — R9431 Abnormal electrocardiogram [ECG] [EKG]: Secondary | ICD-10-CM

## 2024-02-19 DIAGNOSIS — O0990 Supervision of high risk pregnancy, unspecified, unspecified trimester: Secondary | ICD-10-CM

## 2024-02-19 DIAGNOSIS — O99012 Anemia complicating pregnancy, second trimester: Secondary | ICD-10-CM

## 2024-02-19 DIAGNOSIS — E538 Deficiency of other specified B group vitamins: Secondary | ICD-10-CM

## 2024-02-19 LAB — POCT HEMOGLOBIN-HEMACUE: Hemoglobin: 7.3 g/dL — ABNORMAL LOW (ref 12.0–15.0)

## 2024-02-19 MED ORDER — MAGNESIUM 400 MG PO TABS: 400.0000 mg | ORAL_TABLET | Freq: Every day | ORAL | 3 refills | Status: DC

## 2024-02-19 MED ORDER — DIAZEPAM 10 MG PO TABS: 10.0000 mg | ORAL_TABLET | Freq: Four times a day (QID) | ORAL | 0 refills | Status: DC | PRN

## 2024-02-19 MED ORDER — VITAMIN B-12 1000 MCG SL SUBL: 1.0000 | SUBLINGUAL_TABLET | Freq: Every day | SUBLINGUAL | 1 refills | Status: AC

## 2024-02-19 MED ORDER — CYCLOBENZAPRINE HCL 10 MG PO TABS
10.0000 mg | ORAL_TABLET | Freq: Three times a day (TID) | ORAL | 1 refills | Status: DC | PRN
Start: 2024-02-19 — End: 2024-03-25

## 2024-02-19 MED ORDER — BUPRENORPHINE HCL-NALOXONE HCL 8-2 MG SL FILM: 1.0000 | ORAL_FILM | Freq: Four times a day (QID) | SUBLINGUAL | 0 refills | Status: DC

## 2024-02-19 NOTE — Progress Notes (Signed)
 Subjective:  Gabrielle Nguyen is a 28 y.o. [phone] at [redacted]w[redacted]d being seen today for ongoing prenatal care.  She is currently monitored for the following issues for this high-risk pregnancy and has MDD (major depressive disorder); Panic disorder; Asthma; Anxiety with depression; Opioid use disorder; Hyperemesis affecting pregnancy, antepartum; Nausea/vomiting in pregnancy; Prolonged Q-T interval on ECG; Elevated troponin; Acute systolic CHF (congestive heart failure) (HCC); Takotsubo cardiomyopathy; PTSD (post-traumatic stress disorder); Red Chart Rounds Patient; Generalized anxiety disorder; Anemia complicating pregnancy, second trimester; Polysubstance abuse (HCC); Substance induced mood disorder (HCC); Withdrawal from benzodiazepine (HCC); Hypokalemia; Supervision of high risk pregnancy, antepartum; Severe benzodiazepine use disorder (HCC); Opiate withdrawal (HCC); Vitamin B 12 deficiency; and Elevated TSH on their problem list.  Patient reports no complaints.  Contractions: Not present. Vag. Bleeding: None.  Movement: Present. Denies leaking of fluid.   The following portions of the patient's history were reviewed and updated as appropriate: allergies, current medications, past family history, past medical history, past social history, past surgical history and problem list. Problem list updated.  Objective:   Vitals:   02/19/24 1128  BP: 105/66  Pulse: (!) 139  Weight: 146 lb 9.6 oz (66.5 kg)    Fetal Status: Fetal Heart Rate (bpm): 136   Movement: Present     General:  Alert, oriented and cooperative. Patient is in no acute distress.  Skin: Skin is warm and dry. No rash noted.   Cardiovascular: Normal heart rate noted  Respiratory: Normal respiratory effort, no problems with respiration noted  Abdomen: Soft, gravid, appropriate for gestational age. Pain/Pressure: Absent     Pelvic: Vag. Bleeding: None     Cervical exam deferred        Extremities: Normal range of motion.  Edema: None   Mental Status: Normal mood and affect. Normal behavior. Normal judgment and thought content.   Urinalysis:      PDMP not reviewed this encounter.    Last UDS: Lab Results  Component Value Date   CREATIUR 141 01/20/2024       Assessment and Plan:  Pregnancy: [id number] at [redacted]w[redacted]d  1. Supervision of high risk pregnancy, antepartum (Primary) BP and FHR normal Discussed contraception, still thinking about one more child after this Leaning towards post placental hormonal IUD vs patch Thinking about 39 wk IOL, I'm fine with that  2. Polysubstance abuse (HCC) Primarily benzos and opioids in the past Currently has been in excellent period of stability on suboxone  for past few months, UDS appropriate Reports she has been doing well UDS today with verbal consent Refill sent  3. Takotsubo cardiomyopathy Saw cardiology last on 02/10/2024 Last TTE 11/19/2023, EF 65-70%, no wall motion abnormalities, left atrium mildly dilated Started on toprol  XL 25 mg  4. PTSD (post-traumatic stress disorder) Started to see a psychiatrist at [place], Gabrielle Nguyen Started on prazosin  for nightmares in the past, reports it was helping but needed to increase the dose so still working on that with her psychiatrist Also worried that atarax  turns her into a zombie Has tried abilify in the past and it was horrible Wondering about an emergency prescription for benzo to be used only in rare cases Discussed that given our long history, while I am open to this, I have little trust to give for right now At the same time, she has a lot of trauma related to her miscarriage and the approaching birth of this child Discussed we will need to be very clear about our agreement: only 5 tabs per month, to  be used only in emergency situations, and with close UDS monitoring If we deviate from this we will not be able to continue  Per [name] her psychiatrist is OK with this plan as well but did not want to prescribe at same  time that I am doing controlled substances Rx sent for 10 mg valium  5 tabs, to be used only in case of emergency as trial run  5. Prolonged Q-T interval on ECG ECG at recent cards visit was normal, QTc 459 Suspect QT prolongation related in past to methadone  and large amount of QT prolonging meds  6. Vitamin B 12 deficiency 157 on 11/20/2023, started on PO supplement Reports she is taking it, will check B12 level with third trimester labs  7. Red Chart Rounds Patient  8. Anemia of pregnancy Hemocue 7.3, has had IV iron  earlier in pregnancy, will send again  Preterm labor symptoms and general obstetric precautions including but not limited to vaginal bleeding, contractions, leaking of fluid and fetal movement were reviewed in detail with the patient. Please refer to After Visit Summary for other counseling recommendations.  No follow-ups on file.  Future Appointments  Date Time Provider Department Center  02/25/2024  3:00 PM [facility] ECHO/CH OP [facility]  02/26/2024  8:00 AM [facility] PROVIDER 1 [facility]  02/26/2024  8:30 AM [facility] US6 [facility]  03/04/2024  2:00 PM [facility] PROVIDER 1 [facility]  03/04/2024  2:30 PM [facility] US3 [facility]  03/11/2024  1:00 PM [facility] PROVIDER 1 [facility]  03/11/2024  1:30 PM [facility] US2 [facility]  03/15/2024  3:40 PM Bensimhon, Rheta Celestine, MD [facility] None     Teena Feast, MD

## 2024-02-20 ENCOUNTER — Telehealth: Payer: Self-pay | Admitting: Lactation Services

## 2024-02-20 NOTE — Telephone Encounter (Signed)
 Received PA request for Suboxone  films. Called Pharmacy to clarify. Rx has been filled and will be no cost. PA not required.

## 2024-02-22 LAB — TOXASSURE FLEX 15, UR
6-ACETYLMORPHINE IA: NEGATIVE ng/mL
7-aminoclonazepam: NOT DETECTED ng/mg{creat}
AMPHETAMINES IA: NEGATIVE ng/mL
Alpha-hydroxyalprazolam: NOT DETECTED ng/mg{creat}
Alpha-hydroxymidazolam: NOT DETECTED ng/mg{creat}
Alpha-hydroxytriazolam: NOT DETECTED ng/mg{creat}
Alprazolam: NOT DETECTED ng/mg{creat}
BARBITURATES IA: NEGATIVE ng/mL
BUPRENORPHINE: POSITIVE
Benzodiazepines: NEGATIVE
Buprenorphine: >25000 ng/mg{creat}
CANNABINOIDS IA: NEGATIVE ng/mL
COCAINE METABOLITE IA: NEGATIVE ng/mL
Clonazepam: NOT DETECTED ng/mg{creat}
Creatinine: 4 mg/dL — ABNORMAL LOW (ref >=20)
Desalkylflurazepam: NOT DETECTED ng/mg{creat}
Desmethyldiazepam: NOT DETECTED ng/mg{creat}
Desmethylflunitrazepam: NOT DETECTED ng/mg{creat}
Diazepam: NOT DETECTED ng/mg{creat}
ETHYL ALCOHOL Enzymatic: NEGATIVE g/dL
FENTANYL: NEGATIVE
Fentanyl: NOT DETECTED ng/mg{creat}
Flunitrazepam: NOT DETECTED ng/mg{creat}
Lorazepam: NOT DETECTED ng/mg{creat}
METHADONE IA: NEGATIVE ng/mL
METHADONE MTB IA: NEGATIVE ng/mL
Midazolam: NOT DETECTED ng/mg{creat}
Norbuprenorphine: 350 ng/mg{creat}
Norfentanyl: NOT DETECTED ng/mg{creat}
OPIATE CLASS IA: NEGATIVE ng/mL
OXYCODONE CLASS IA: NEGATIVE ng/mL
Oxazepam: NOT DETECTED ng/mg{creat}
PHENCYCLIDINE IA: NEGATIVE ng/mL
TAPENTADOL, IA: NEGATIVE ng/mL
TRAMADOL IA: NEGATIVE ng/mL
Temazepam: NOT DETECTED ng/mg{creat}

## 2024-02-23 ENCOUNTER — Telehealth: Payer: Self-pay

## 2024-02-23 NOTE — Telephone Encounter (Signed)
 Auth Submission: NO AUTH NEEDED Site of care: Site of care: [facility] Payer: [employer] medicaid Medication & CPT/J Code(s) submitted: Venofer  (Iron  Sucrose) J1756 Diagnosis Code:  Route of submission (phone, fax, portal):  Phone # Fax # Auth type: Buy/Bill PB Units/visits requested: 300mg  x 2 doses Reference number:  Approval from: 02/23/24 to 06/24/24

## 2024-02-24 ENCOUNTER — Ambulatory Visit: Payer: Self-pay | Admitting: Family Medicine

## 2024-02-25 ENCOUNTER — Ambulatory Visit (HOSPITAL_COMMUNITY): Payer: MEDICAID

## 2024-02-26 ENCOUNTER — Other Ambulatory Visit: Payer: MEDICAID

## 2024-02-26 ENCOUNTER — Ambulatory Visit: Payer: MEDICAID | Attending: Obstetrics

## 2024-03-03 ENCOUNTER — Ambulatory Visit: Payer: MEDICAID

## 2024-03-04 ENCOUNTER — Other Ambulatory Visit: Payer: MEDICAID

## 2024-03-06 ENCOUNTER — Encounter (HOSPITAL_COMMUNITY): Payer: Self-pay

## 2024-03-06 ENCOUNTER — Other Ambulatory Visit: Payer: Self-pay

## 2024-03-06 ENCOUNTER — Inpatient Hospital Stay (HOSPITAL_COMMUNITY)
Admission: AD | Admit: 2024-03-06 | Discharge: 2024-03-07 | Disposition: A | Discharge status: Home / Self Care | Payer: MEDICAID | Attending: Obstetrics & Gynecology | Admitting: Obstetrics & Gynecology

## 2024-03-06 DIAGNOSIS — F1110 Opioid abuse, uncomplicated: Secondary | ICD-10-CM | POA: Insufficient documentation

## 2024-03-06 DIAGNOSIS — Z3A35 35 weeks gestation of pregnancy: Secondary | ICD-10-CM | POA: Insufficient documentation

## 2024-03-06 DIAGNOSIS — F1910 Other psychoactive substance abuse, uncomplicated: Secondary | ICD-10-CM

## 2024-03-06 DIAGNOSIS — O99323 Drug use complicating pregnancy, third trimester: Secondary | ICD-10-CM | POA: Insufficient documentation

## 2024-03-06 DIAGNOSIS — F19139 Other psychoactive substance abuse with withdrawal, unspecified: Secondary | ICD-10-CM | POA: Insufficient documentation

## 2024-03-06 DIAGNOSIS — F1190 Opioid use, unspecified, uncomplicated: Secondary | ICD-10-CM

## 2024-03-06 DIAGNOSIS — F19939 Other psychoactive substance use, unspecified with withdrawal, unspecified: Secondary | ICD-10-CM

## 2024-03-06 MED ORDER — BUPRENORPHINE HCL-NALOXONE HCL 8-2 MG SL FILM: 1.0000 | ORAL_FILM | Freq: Four times a day (QID) | SUBLINGUAL | 0 refills | Status: DC

## 2024-03-06 MED ORDER — BUPRENORPHINE HCL-NALOXONE HCL 8-2 MG SL FILM: 1.0000 | ORAL_FILM | Freq: Four times a day (QID) | SUBLINGUAL | 0 refills | Status: AC
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
## 2024-03-07 DIAGNOSIS — F19939 Other psychoactive substance use, unspecified with withdrawal, unspecified: Secondary | ICD-10-CM

## 2024-03-07 DIAGNOSIS — Z3A35 35 weeks gestation of pregnancy: Secondary | ICD-10-CM | POA: Diagnosis not present

## 2024-03-07 DIAGNOSIS — F1190 Opioid use, unspecified, uncomplicated: Secondary | ICD-10-CM | POA: Diagnosis not present

## 2024-03-07 DIAGNOSIS — O26893 Other specified pregnancy related conditions, third trimester: Secondary | ICD-10-CM | POA: Diagnosis not present

## 2024-03-07 DIAGNOSIS — F1110 Opioid abuse, uncomplicated: Secondary | ICD-10-CM | POA: Diagnosis not present

## 2024-03-07 DIAGNOSIS — F19139 Other psychoactive substance abuse with withdrawal, unspecified: Secondary | ICD-10-CM | POA: Diagnosis not present

## 2024-03-07 DIAGNOSIS — O99323 Drug use complicating pregnancy, third trimester: Secondary | ICD-10-CM | POA: Diagnosis present

## 2024-03-07 NOTE — MAU Note (Signed)
 03/07/2024  Gabrielle Nguyen DOB: 1996-07-12 MRN: [id number]   RIDER WAIVER AND RELEASE OF LIABILITY  For the purposes of helping with transportation needs, [facility] partners with outside transportation providers (taxi companies, [place], Catering manager.) to give [facility] patients or other approved people the choice of on-demand rides Public librarian) to our buildings for non-emergency visits.  By using [employer], I, the person signing this document, on behalf of myself and/or any legal minors (in my care using the [employer]), agree:  Science writer given to me are supplied by independent, outside transportation providers who do not work for, or have any affiliation with, [employer]. [place] is not a transportation company. [place] [facility] has no control over the quality or safety of the rides I get using [employer]. [place] has no control over whether any outside ride will happen on time or not. [facility] gives no guarantee on the reliability, quality, safety, or availability on any rides, or that no mistakes will happen. I know and accept that traveling by vehicle (car, truck, SVU, fleeta, bus, taxi, etc.) has risks of serious injuries such as disability, being paralyzed, and death. I know and agree the risk of using [employer] is mine alone, and not [employer]. [employer] are provided as is and as are available. The transportation providers are in charge for all inspections and care of the vehicles used to provide these rides. I agree not to take legal action against [place], its agents, employees, officers, directors, representatives, insurers, attorneys, assigns, successors, subsidiaries, and affiliates at any time for any reasons related directly or indirectly to using [employer]. I also agree not to take legal action against [place] or its affiliates for any injury, death, or damage to property caused by or related to using  [employer]. I have read this Waiver and Release of Liability, and I understand the terms used in it and their legal meaning. This Waiver is freely and voluntarily given with the understanding that my right (or any legal minors) to legal action against [place] relating to [employer] is knowingly given up to use these services.   I attest that I read the Ride Waiver and Release of Liability to [facility], gave Ms. Koenigs the opportunity to ask questions and answered the questions asked (if any). I affirm that [facility] then provided consent for assistance with transportation.
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
## 2024-03-09 ENCOUNTER — Encounter: Payer: MEDICAID | Admitting: Family Medicine

## 2024-03-10 ENCOUNTER — Ambulatory Visit: Payer: MEDICAID

## 2024-03-11 ENCOUNTER — Other Ambulatory Visit: Payer: MEDICAID

## 2024-03-11 ENCOUNTER — Ambulatory Visit: Payer: MEDICAID

## 2024-03-12 ENCOUNTER — Telehealth (HOSPITAL_COMMUNITY): Payer: Self-pay | Admitting: Internal Medicine

## 2024-03-12 NOTE — Telephone Encounter (Signed)
 Called to confirm/remind patient of their appointment at the [facility] on 03/12/24 .   Appointment:   [] Confirmed  [] Left mess   [x] No answer/No voice mail  [] VM Full/unable to leave message  [] Phone not in service  Patient reminded to bring all medications and/or complete list.  Confirmed patient has transportation. Gave directions, instructed to utilize valet parking.

## 2024-03-15 ENCOUNTER — Encounter (HOSPITAL_COMMUNITY): Payer: MEDICAID | Admitting: Internal Medicine
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
## 2024-03-16 ENCOUNTER — Telehealth: Payer: Self-pay

## 2024-03-16 NOTE — Telephone Encounter (Signed)
 A voicemail was left on [facility] Nurse Line from [employer] regarding [employer].  Caller sounded upset.  He listed pt DOB and stated that he urgently needs Dr. Lola to call him because the pt has gone MIA.  He left a callback number of [phone].  He stated that Dr. Lola is her provider.    Report given to Dr. Lola about this voicemail on nurse line.  Dr. Lola familiar with pt and will return call.    [place], RN

## 2024-03-16 NOTE — Telephone Encounter (Signed)
 Returned call to [place], who I have seen and met many times before.  He reports that [place] has not been in contact with him and has run away to a drug dealer's house, that he thinks she is doing fentanyl  again and won't speak to him or anyone in his or her family. He is very worried about the child and that she is missing many appointments. He is also worried about getting custody of the child after delivery.   I told Marolyn that due to legal and ethical obligations I can not tell him anything about Odile's care. We also discussed that even when [place] does come to the hospital to deliver, no one will contact him unless [place] requests it and approves it. I also discussed he may not be able to be involved immediately in the care of the child without being identified as the father which will be up to [place] to do, so he may not receive a call from the pediatrics/NICU team either after delivery.   I encouraged him to try to contact [place] and if does reach her to encourage her to attend appointments.

## 2024-03-17 ENCOUNTER — Ambulatory Visit: Payer: MEDICAID

## 2024-03-22 ENCOUNTER — Encounter (HOSPITAL_COMMUNITY): Payer: Self-pay

## 2024-03-22 ENCOUNTER — Other Ambulatory Visit: Payer: Self-pay

## 2024-03-22 ENCOUNTER — Inpatient Hospital Stay (HOSPITAL_COMMUNITY): Payer: MEDICAID | Admitting: Anesthesiology

## 2024-03-22 ENCOUNTER — Inpatient Hospital Stay (HOSPITAL_COMMUNITY)
Admission: RE | Admit: 2024-03-22 | Discharge: 2024-03-24 | DRG: 806 | Disposition: A | Discharge status: Home / Self Care | Payer: MEDICAID | Attending: Obstetrics and Gynecology | Admitting: Obstetrics and Gynecology

## 2024-03-22 ENCOUNTER — Encounter (HOSPITAL_COMMUNITY): Payer: Self-pay | Admitting: Obstetrics and Gynecology

## 2024-03-22 DIAGNOSIS — Z8249 Family history of ischemic heart disease and other diseases of the circulatory system: Secondary | ICD-10-CM | POA: Diagnosis not present

## 2024-03-22 DIAGNOSIS — O99324 Drug use complicating childbirth: Secondary | ICD-10-CM | POA: Diagnosis present

## 2024-03-22 DIAGNOSIS — F410 Panic disorder [episodic paroxysmal anxiety] without agoraphobia: Secondary | ICD-10-CM | POA: Diagnosis present

## 2024-03-22 DIAGNOSIS — O99824 Streptococcus B carrier state complicating childbirth: Principal | ICD-10-CM | POA: Diagnosis present

## 2024-03-22 DIAGNOSIS — Z79899 Other long term (current) drug therapy: Secondary | ICD-10-CM

## 2024-03-22 DIAGNOSIS — I5181 Takotsubo syndrome: Secondary | ICD-10-CM | POA: Diagnosis present

## 2024-03-22 DIAGNOSIS — F1190 Opioid use, unspecified, uncomplicated: Secondary | ICD-10-CM | POA: Diagnosis present

## 2024-03-22 DIAGNOSIS — O9902 Anemia complicating childbirth: Secondary | ICD-10-CM | POA: Diagnosis present

## 2024-03-22 DIAGNOSIS — O99820 Streptococcus B carrier state complicating pregnancy: Secondary | ICD-10-CM | POA: Diagnosis not present

## 2024-03-22 DIAGNOSIS — F418 Other specified anxiety disorders: Secondary | ICD-10-CM | POA: Diagnosis present

## 2024-03-22 DIAGNOSIS — Z3A37 37 weeks gestation of pregnancy: Secondary | ICD-10-CM | POA: Diagnosis not present

## 2024-03-22 DIAGNOSIS — O99284 Endocrine, nutritional and metabolic diseases complicating childbirth: Secondary | ICD-10-CM | POA: Diagnosis present

## 2024-03-22 DIAGNOSIS — O9952 Diseases of the respiratory system complicating childbirth: Secondary | ICD-10-CM | POA: Diagnosis present

## 2024-03-22 DIAGNOSIS — Z7951 Long term (current) use of inhaled steroids: Secondary | ICD-10-CM

## 2024-03-22 DIAGNOSIS — O99344 Other mental disorders complicating childbirth: Secondary | ICD-10-CM | POA: Diagnosis present

## 2024-03-22 DIAGNOSIS — F411 Generalized anxiety disorder: Secondary | ICD-10-CM | POA: Diagnosis present

## 2024-03-22 DIAGNOSIS — F4310 Post-traumatic stress disorder, unspecified: Secondary | ICD-10-CM | POA: Diagnosis present

## 2024-03-22 DIAGNOSIS — R7989 Other specified abnormal findings of blood chemistry: Secondary | ICD-10-CM | POA: Diagnosis present

## 2024-03-22 DIAGNOSIS — E538 Deficiency of other specified B group vitamins: Secondary | ICD-10-CM | POA: Diagnosis present

## 2024-03-22 DIAGNOSIS — F1110 Opioid abuse, uncomplicated: Secondary | ICD-10-CM | POA: Diagnosis present

## 2024-03-22 DIAGNOSIS — Z7189 Other specified counseling: Secondary | ICD-10-CM

## 2024-03-22 DIAGNOSIS — F329 Major depressive disorder, single episode, unspecified: Secondary | ICD-10-CM | POA: Diagnosis present

## 2024-03-22 DIAGNOSIS — F32A Depression, unspecified: Secondary | ICD-10-CM | POA: Diagnosis present

## 2024-03-22 DIAGNOSIS — O0990 Supervision of high risk pregnancy, unspecified, unspecified trimester: Secondary | ICD-10-CM

## 2024-03-22 DIAGNOSIS — R9431 Abnormal electrocardiogram [ECG] [EKG]: Secondary | ICD-10-CM | POA: Diagnosis present

## 2024-03-22 DIAGNOSIS — F1320 Sedative, hypnotic or anxiolytic dependence, uncomplicated: Secondary | ICD-10-CM | POA: Diagnosis present

## 2024-03-22 DIAGNOSIS — J45909 Unspecified asthma, uncomplicated: Secondary | ICD-10-CM | POA: Diagnosis present

## 2024-03-22 DIAGNOSIS — O99012 Anemia complicating pregnancy, second trimester: Secondary | ICD-10-CM | POA: Diagnosis present

## 2024-03-22 DIAGNOSIS — O9942 Diseases of the circulatory system complicating childbirth: Secondary | ICD-10-CM | POA: Diagnosis present

## 2024-03-22 DIAGNOSIS — O26893 Other specified pregnancy related conditions, third trimester: Secondary | ICD-10-CM | POA: Diagnosis present

## 2024-03-22 DIAGNOSIS — F1910 Other psychoactive substance abuse, uncomplicated: Secondary | ICD-10-CM | POA: Diagnosis present

## 2024-03-22 LAB — TSH: TSH: 1.399 u[IU]/mL (ref 0.350–4.500)

## 2024-03-22 LAB — RAPID URINE DRUG SCREEN, HOSP PERFORMED
Amphetamines: NOT DETECTED
Barbiturates: NOT DETECTED
Benzodiazepines: POSITIVE — AB
Cocaine: NOT DETECTED
Opiates: NOT DETECTED
Tetrahydrocannabinol: POSITIVE — AB

## 2024-03-22 LAB — TYPE AND SCREEN
ABO/RH(D): A POS
Antibody Screen: NEGATIVE

## 2024-03-22 LAB — GROUP B STREP BY PCR: Group B strep by PCR: POSITIVE — AB

## 2024-03-22 LAB — CBC
HCT: 30.2 % — ABNORMAL LOW (ref 36.0–46.0)
Hemoglobin: 8.7 g/dL — ABNORMAL LOW (ref 12.0–15.0)
MCH: 23 pg — ABNORMAL LOW (ref 26.0–34.0)
MCHC: 28.8 g/dL — ABNORMAL LOW (ref 30.0–36.0)
MCV: 79.9 fL — ABNORMAL LOW (ref 80.0–100.0)
Platelets: 148 K/uL — ABNORMAL LOW (ref 150–400)
RBC: 3.78 MIL/uL — ABNORMAL LOW (ref 3.87–5.11)
RDW: 17 % — ABNORMAL HIGH (ref 11.5–15.5)
WBC: 8.2 K/uL (ref 4.0–10.5)
nRBC: 0.4 % — ABNORMAL HIGH (ref 0.0–0.2)

## 2024-03-22 MED ORDER — ACETAMINOPHEN 325 MG PO TABS: 650.0000 mg | ORAL_TABLET | ORAL | Status: DC | PRN

## 2024-03-22 MED ORDER — LACTATED RINGERS IV SOLN
500.0000 mL | Freq: Once | INTRAVENOUS | Status: AC
  Administered 2024-03-22: 500 mL via INTRAVENOUS

## 2024-03-22 MED ORDER — PENICILLIN G POT IN DEXTROSE 60000 UNIT/ML IV SOLN: 3.0000 10*6.[IU] | INTRAVENOUS | Status: DC

## 2024-03-22 MED ORDER — LIDOCAINE HCL (PF) 1 % IJ SOLN: 30.0000 mL | INTRAMUSCULAR | Status: DC | PRN

## 2024-03-22 MED ORDER — OXYTOCIN BOLUS FROM INFUSION: 333.0000 mL | Freq: Once | INTRAVENOUS | Status: DC

## 2024-03-22 MED ORDER — OXYTOCIN-SODIUM CHLORIDE 30-0.9 UT/500ML-% IV SOLN
INTRAVENOUS | Status: AC
  Filled 2024-03-22: qty 500

## 2024-03-22 MED ORDER — FENTANYL-BUPIVACAINE-NACL 0.5-0.125-0.9 MG/250ML-% EP SOLN
EPIDURAL | Status: AC
  Filled 2024-03-22: qty 250

## 2024-03-22 MED ORDER — HYDROXYZINE HCL 50 MG PO TABS: 50.0000 mg | ORAL_TABLET | Freq: Four times a day (QID) | ORAL | Status: DC | PRN

## 2024-03-22 MED ORDER — SOD CITRATE-CITRIC ACID 500-334 MG/5ML PO SOLN: 30.0000 mL | ORAL | Status: DC | PRN

## 2024-03-22 MED ORDER — OXYCODONE-ACETAMINOPHEN 5-325 MG PO TABS: 1.0000 | ORAL_TABLET | ORAL | Status: DC | PRN

## 2024-03-22 MED ORDER — FENTANYL-BUPIVACAINE-NACL 0.5-0.125-0.9 MG/250ML-% EP SOLN
12.0000 mL/h | EPIDURAL | Status: DC | PRN
  Administered 2024-03-22: 12 mL/h via EPIDURAL

## 2024-03-22 MED ORDER — LACTATED RINGERS IV SOLN: 500.0000 mL | INTRAVENOUS | Status: DC | PRN

## 2024-03-22 MED ORDER — EPHEDRINE 5 MG/ML INJ: 10.0000 mg | INTRAVENOUS | Status: DC | PRN

## 2024-03-22 MED ORDER — PENICILLIN G POTASSIUM 5000000 UNITS IJ SOLR: 5.0000 10*6.[IU] | Freq: Once | INTRAMUSCULAR | Status: DC

## 2024-03-22 MED ORDER — LACTATED RINGERS IV SOLN: INTRAVENOUS | Status: DC

## 2024-03-22 MED ORDER — LIDOCAINE HCL (PF) 1 % IJ SOLN
INTRAMUSCULAR | Status: DC | PRN
  Administered 2024-03-22 (×2): 4 mL via EPIDURAL

## 2024-03-22 MED ORDER — FENTANYL CITRATE (PF) 100 MCG/2ML IJ SOLN: 50.0000 ug | INTRAMUSCULAR | Status: DC | PRN

## 2024-03-22 MED ORDER — OXYCODONE-ACETAMINOPHEN 5-325 MG PO TABS: 2.0000 | ORAL_TABLET | ORAL | Status: DC | PRN

## 2024-03-22 MED ORDER — TRANEXAMIC ACID-NACL 1000-0.7 MG/100ML-% IV SOLN
INTRAVENOUS | Status: AC
  Filled 2024-03-22: qty 100

## 2024-03-22 MED ORDER — PHENYLEPHRINE 80 MCG/ML (10ML) SYRINGE FOR IV PUSH (FOR BLOOD PRESSURE SUPPORT): 80.0000 ug | PREFILLED_SYRINGE | INTRAVENOUS | Status: DC | PRN

## 2024-03-22 MED ORDER — DIPHENHYDRAMINE HCL 50 MG/ML IJ SOLN: 12.5000 mg | INTRAMUSCULAR | Status: DC | PRN

## 2024-03-22 MED ORDER — CEFAZOLIN SODIUM-DEXTROSE 2-4 GM/100ML-% IV SOLN
2.0000 g | Freq: Three times a day (TID) | INTRAVENOUS | Status: AC
  Administered 2024-03-23: 2 g via INTRAVENOUS
  Filled 2024-03-22: qty 100

## 2024-03-22 MED ORDER — PHENYLEPHRINE 80 MCG/ML (10ML) SYRINGE FOR IV PUSH (FOR BLOOD PRESSURE SUPPORT)
80.0000 ug | PREFILLED_SYRINGE | INTRAVENOUS | Status: DC | PRN
  Filled 2024-03-22: qty 10

## 2024-03-22 NOTE — Anesthesia Procedure Notes (Signed)
 Epidural Patient location during procedure: OB Start time: 03/22/2024 9:18 PM End time: 03/22/2024 9:21 PM  Staffing Anesthesiologist: Paul Lamarr BRAVO, MD Performed: anesthesiologist   Preanesthetic Checklist Completed: patient identified, IV checked, risks and benefits discussed, monitors and equipment checked, pre-op evaluation and timeout performed  Epidural Patient position: sitting Prep: DuraPrep and site prepped and draped Patient monitoring: continuous pulse ox, blood pressure and heart rate Approach: midline Location: L3-L4 Injection technique: LOR air  Needle:  Needle type: Tuohy  Needle gauge: 17 G Needle length: 9 cm Needle insertion depth: 5 cm Catheter type: closed end flexible Catheter size: 19 Gauge Catheter at skin depth: 10 cm Test dose: negative and Other (1% lidocaine )  Assessment Events: blood not aspirated, no cerebrospinal fluid, injection not painful, no injection resistance, no paresthesia and negative IV test  Additional Notes Patient identified. Risks, benefits, and alternatives discussed with patient including but not limited to bleeding, infection, nerve damage, paralysis, failed block, incomplete pain control, headache, blood pressure changes, nausea, vomiting, reactions to medication, itching, and postpartum back pain. Confirmed with bedside nurse the patient's most recent platelet count. Confirmed with patient that they are not currently taking any anticoagulation, have any bleeding history, or any family history of bleeding disorders. Patient expressed understanding and wished to proceed. All questions were answered. Sterile technique was used throughout the entire procedure. Please see nursing notes for vital signs.   Crisp LOR on first pass. Test dose was given through epidural catheter and negative prior to continuing to dose epidural or start infusion. Warning signs of high block given to the patient including shortness of breath,  tingling/numbness in hands, complete motor block, or any concerning symptoms with instructions to call for help. Patient was given instructions on fall risk and not to get out of bed. All questions and concerns addressed with instructions to call with any issues or inadequate analgesia.  Reason for block:procedure for pain

## 2024-03-22 NOTE — MAU Note (Signed)
 Gabrielle Nguyen is a 27 y.o. at [redacted]w[redacted]d here in MAU reporting: ctx for the past 3 hours - every 3-4 minutes. Denies VB. A little leaking of fluid - not a big gush, but leaking throughout the day. +FM   LMP: NA Onset of complaint: 1700 Pain score: 10 Vitals:   03/22/24 2002  BP: 107/63  Pulse: 88  Resp: 19  Temp: 98.6 F (37 C)  SpO2: 100%     FHT: 127  Lab orders placed from triage: LABOR EVAL
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
## 2024-03-22 NOTE — Anesthesia Preprocedure Evaluation (Addendum)
 Anesthesia Evaluation  Patient identified by MRN, date of birth, ID band Patient awake    Reviewed: Allergy & Precautions, Patient's Chart, lab work & pertinent test results  History of Anesthesia Complications Negative for: history of anesthetic complications  Airway Mallampati: II  TM Distance: >3 FB Neck ROM: Full    Dental no notable dental hx.    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  Reproductive/Obstetrics (+) Pregnancy                              Anesthesia Physical Anesthesia Plan  ASA: 3  Anesthesia Plan: Epidural   Post-op Pain Management:    Induction:   PONV Risk Score and Plan: Treatment may vary due to age or medical condition  Airway Management Planned: Natural Airway  Additional Equipment: Fetal Monitoring  Intra-op Plan:   Post-operative Plan:   Informed Consent: I have reviewed the patients History and Physical, chart, labs and discussed the procedure including the risks, benefits and alternatives for the proposed anesthesia with the patient or authorized representative who has indicated his/her understanding and acceptance.       Plan Discussed with:   Anesthesia Plan Comments:          Anesthesia Quick Evaluation

## 2024-03-23 LAB — RPR: RPR Ser Ql: NONREACTIVE

## 2024-03-23 MED ORDER — PRAZOSIN HCL 2 MG PO CAPS
2.0000 mg | ORAL_CAPSULE | Freq: Every day | ORAL | Status: DC
  Administered 2024-03-23: 2 mg via ORAL
  Filled 2024-03-23 (×2): qty 1

## 2024-03-23 MED ORDER — ACETAMINOPHEN 500 MG PO TABS
1000.0000 mg | ORAL_TABLET | Freq: Three times a day (TID) | ORAL | Status: DC
  Administered 2024-03-23 – 2024-03-24 (×5): 1000 mg via ORAL
  Filled 2024-03-23 (×5): qty 2

## 2024-03-23 MED ORDER — ONDANSETRON HCL 4 MG/2ML IJ SOLN
4.0000 mg | INTRAMUSCULAR | Status: DC | PRN
Start: 2024-03-23 — End: 2024-03-24

## 2024-03-23 MED ORDER — PRENATAL MULTIVITAMIN CH
1.0000 | ORAL_TABLET | Freq: Every day | ORAL | Status: DC
  Administered 2024-03-23 – 2024-03-24 (×2): 1 via ORAL
  Filled 2024-03-23 (×2): qty 1

## 2024-03-23 MED ORDER — METHADONE HCL 10 MG PO TABS
100.0000 mg | ORAL_TABLET | Freq: Every day | ORAL | Status: DC
  Administered 2024-03-23: 100 mg via ORAL
  Filled 2024-03-23 (×2): qty 10

## 2024-03-23 MED ORDER — OXYCODONE HCL 5 MG PO TABS: 5.0000 mg | ORAL_TABLET | Freq: Four times a day (QID) | ORAL | Status: DC | PRN

## 2024-03-23 MED ORDER — DIBUCAINE (PERIANAL) 1 % EX OINT: 1.0000 | TOPICAL_OINTMENT | CUTANEOUS | Status: DC | PRN

## 2024-03-23 MED ORDER — ONDANSETRON HCL 4 MG PO TABS
4.0000 mg | ORAL_TABLET | ORAL | Status: DC | PRN
  Administered 2024-03-23: 4 mg via ORAL
  Filled 2024-03-23: qty 1

## 2024-03-23 MED ORDER — BENZOCAINE-MENTHOL 20-0.5 % EX AERO
1.0000 | INHALATION_SPRAY | CUTANEOUS | Status: DC | PRN
Start: 2024-03-23 — End: 2024-03-24

## 2024-03-23 MED ORDER — FLUTICASONE FUROATE-VILANTEROL 200-25 MCG/ACT IN AEPB
1.0000 | INHALATION_SPRAY | Freq: Every day | RESPIRATORY_TRACT | Status: DC
  Administered 2024-03-23 – 2024-03-24 (×2): 1 via RESPIRATORY_TRACT
  Filled 2024-03-23: qty 28

## 2024-03-23 MED ORDER — COCONUT OIL OIL: 1.0000 | TOPICAL_OIL | Status: DC | PRN

## 2024-03-23 MED ORDER — METOPROLOL SUCCINATE ER 25 MG PO TB24
25.0000 mg | ORAL_TABLET | Freq: Every evening | ORAL | Status: DC
  Administered 2024-03-23: 25 mg via ORAL
  Filled 2024-03-23 (×3): qty 1

## 2024-03-23 MED ORDER — SENNOSIDES-DOCUSATE SODIUM 8.6-50 MG PO TABS
2.0000 | ORAL_TABLET | Freq: Every day | ORAL | Status: DC
  Administered 2024-03-24: 2 via ORAL
  Filled 2024-03-23: qty 2

## 2024-03-23 MED ORDER — IBUPROFEN 800 MG PO TABS
800.0000 mg | ORAL_TABLET | Freq: Three times a day (TID) | ORAL | Status: DC
Start: 2024-03-23 — End: 2024-03-24
  Administered 2024-03-23 – 2024-03-24 (×5): 800 mg via ORAL
  Filled 2024-03-23 (×5): qty 1

## 2024-03-23 MED ORDER — MEDROXYPROGESTERONE ACETATE 150 MG/ML IM SUSP: 150.0000 mg | INTRAMUSCULAR | Status: DC | PRN

## 2024-03-23 MED ORDER — ZOLPIDEM TARTRATE 5 MG PO TABS: 5.0000 mg | ORAL_TABLET | Freq: Every evening | ORAL | Status: DC | PRN

## 2024-03-23 MED ORDER — DIPHENHYDRAMINE HCL 25 MG PO CAPS
25.0000 mg | ORAL_CAPSULE | Freq: Four times a day (QID) | ORAL | Status: DC | PRN
Start: 2024-03-23 — End: 2024-03-24

## 2024-03-23 MED ORDER — OXYCODONE HCL 5 MG PO TABS
10.0000 mg | ORAL_TABLET | Freq: Four times a day (QID) | ORAL | Status: DC | PRN
  Administered 2024-03-23 – 2024-03-24 (×2): 10 mg via ORAL
  Filled 2024-03-23 (×2): qty 2

## 2024-03-23 MED ORDER — SERTRALINE HCL 50 MG PO TABS
75.0000 mg | ORAL_TABLET | Freq: Every day | ORAL | Status: DC
  Administered 2024-03-23 – 2024-03-24 (×2): 75 mg via ORAL
  Filled 2024-03-23 (×2): qty 1

## 2024-03-23 MED ORDER — WITCH HAZEL-GLYCERIN EX PADS: 1.0000 | MEDICATED_PAD | CUTANEOUS | Status: DC | PRN

## 2024-03-23 MED ORDER — SIMETHICONE 80 MG PO CHEW
80.0000 mg | CHEWABLE_TABLET | ORAL | Status: DC | PRN
Start: 2024-03-23 — End: 2024-03-24

## 2024-03-23 NOTE — Progress Notes (Addendum)
 Post Partum Day 1 Subjective:  Gabrielle Nguyen is a 28 y.o. H4E6986 [redacted]w[redacted]d s/p NSVD.  No acute events overnight.  Pt denies problems with ambulating, voiding or po intake.  She denies nausea or vomiting.  Pain is not well controlled, of note she has just received tylenol  and motrin  prior to the visit. She reports pins and needles in her arms and legs, though pain is most prominent at the vaginal area.  She has had flatus.  Lochia Minimal.  Plan for birth control is oral contraceptives (estrogen/progesterone ).  Method of Feeding: breastfeeding  Objective: Blood pressure 116/72, pulse 70, temperature 98 F (36.7 C), temperature source Oral, resp. rate 18, height 5' 6 (1.676 m), weight 69.4 kg, SpO2 100%, unknown if currently breastfeeding.  Physical Exam:  General: alert, cooperative and no distress Lochia:  Chest: normal WOB Heart: Regular rate Abdomen: normal bowel sounds Uterine Fundus: firm DVT Evaluation: no evidence of DVT on exam  Extremities: No peripheral edema  Recent Labs    03/22/24 2042  HGB 8.7*  HCT 30.2*    Assessment/Plan:  ASSESSMENT: Gabrielle Nguyen is a 28 y.o. H4E6986 [redacted]w[redacted]d s/p NSVD  Plan for discharge tomorrow Continue routine PP care Breastfeeding support PRN PO ferrous sulfate  325 every other day   This is a Psychologist, occupational Note.     LOS: 1 day   Charlene Duwaine PARAS, Medical Student 03/23/2024, 6:13 AM

## 2024-03-23 NOTE — Lactation Note (Addendum)
 This note was copied from a baby's chart. Lactation Consultation Note  Patient Name: Gabrielle Nguyen Today's Date: 03/23/2024 Age:30 hours Reason for consult: 1st time breastfeeding;Primapara;Early term 37-38.6wks  P1, 37 wks, @ 12 hrs of life. LC rounds @ expected feeding time, mom holding @ breast- swaddled. Demonstrated unwrapping baby to awaken baby, cross-cradle positioning on left breast. Demonstrated starting with hand expression, multiple drops of colostrum easily expressed. Demonstrated steps of latching- babies jaw very tight- not opening mouth well. Reassured mom - all part of the learning process for baby, continue to work with baby on these jaw opening exercises. Baby is able to latch and work for 5 minutes before turning head sideways on breast to rest. Hand expressed several drops from each breast and fed off spoon to finish feeding. Encouraged mom colostrum very concentrated for short duration feeds with initial low energy of baby. Discussed expectations @ breast- Day 1- sleepy/ feed every 3 hrs/ even 10 minutes is okay, Day 2 more awake/ feeding cues/longer feeds, and cluster feeding overnights brings milk in. Highlighted breast stimulation is tied directly to milk production. Discussed hands on breast and baby, keeping baby awake @ breast. Starting with hand expression & breast compression to get baby working @ breast, and gentle stimulation to keep baby working @ breast. Encouraged EBM for nipple care post feed. LC services and milk storage shared. Encouraged mom to call for assist anytime desired. Hand pump provided, 21 mm now, 24 and 27 provided for @ home. Not eligible for stork- whose out of supply currently anyway. Encouraged mom to continue ordering process through [employer].    Maternal Data Has patient been taught Hand Expression?: Yes Does the patient have breastfeeding experience prior to this delivery?: Yes How long did the patient breastfeed?: 1st child 6 months, 2nd  child 1 yr  Feeding Mother's Current Feeding Choice: Breast Milk  LATCH Score Latch: Grasps breast easily, tongue down, lips flanged, rhythmical sucking.  Audible Swallowing: Spontaneous and intermittent  Type of Nipple: Everted at rest and after stimulation  Comfort (Breast/Nipple): Soft / non-tender  Hold (Positioning): Full assist, staff holds infant at breast  LATCH Score: 8   Lactation Tools Discussed/Used Tools: Pump;Flanges Flange Size: 21 Breast pump type: Manual Pump Education: Milk Storage;Setup, frequency, and cleaning  Interventions Interventions: Breast feeding basics reviewed;Assisted with latch;Hand express;Breast compression;Support pillows;Education;LC Services brochure;CDC milk storage guidelines  Discharge Pump: Manual;Personal  Consult Status Consult Status: Follow-up Date: 03/24/24 Follow-up type: In-patient    [facility] 03/23/2024, 12:12 PM

## 2024-03-23 NOTE — Anesthesia Postprocedure Evaluation (Signed)
 Anesthesia Post Note  Patient: Gabrielle Nguyen  Procedure(s) Performed: AN AD HOC LABOR EPIDURAL     Patient location during evaluation: Mother Baby Anesthesia Type: Epidural Level of consciousness: awake and alert and oriented Pain management: satisfactory to patient Vital Signs Assessment: post-procedure vital signs reviewed and stable Respiratory status: respiratory function stable Cardiovascular status: stable Postop Assessment: no headache, no backache, epidural receding, patient able to bend at knees, no signs of nausea or vomiting, adequate PO intake and able to ambulate Anesthetic complications: no   No notable events documented.  Last Vitals:  Vitals:   03/23/24 0240 03/23/24 0640  BP: 116/72 110/63  Pulse: 70 63  Resp: 18 18  Temp: 36.7 C 36.8 C  SpO2: 100% 100%    Last Pain:  Vitals:   03/23/24 0914  TempSrc:   PainSc: 8    Pain Goal: Patients Stated Pain Goal: 0 (03/23/24 0332)              Epidural/Spinal Function Cutaneous sensation: Normal sensation (03/23/24 0914), Patient able to flex knees: Yes (03/23/24 0914), Patient able to lift hips off bed: Yes (03/23/24 0914), Back pain beyond tenderness at insertion site: No (03/23/24 0914), Progressively worsening motor and/or sensory loss: No (03/23/24 0914), Bowel and/or bladder incontinence post epidural: No (03/23/24 0914)  Gabrielle Nguyen

## 2024-03-23 NOTE — Discharge Summary (Signed)
 Postpartum Discharge Summary  Date of Service updated***     Patient Name: Gabrielle Nguyen DOB: 05-09-1996 MRN: [id number]  Date of admission: 03/22/2024 Delivery date:03/22/2024 Delivering provider: NICHOLAUS NORRIS Date of discharge: 03/23/2024  Admitting diagnosis: Normal labor [O80, Z37.9] Intrauterine pregnancy: [redacted]w[redacted]d     Secondary diagnosis:  Principal Problem:   NSVD (normal spontaneous vaginal delivery) Active Problems:   MDD (major depressive disorder)   Panic disorder   Asthma   Opioid use disorder   Takotsubo cardiomyopathy   Red Chart Rounds Patient   Generalized anxiety disorder   Polysubstance abuse (HCC)   Supervision of high risk pregnancy, antepartum   Severe benzodiazepine use disorder (HCC)   Elevated TSH  Additional problems: ***    Discharge diagnosis: Term Pregnancy Delivered                                              Post partum procedures:{Postpartum procedures:23558} Augmentation: AROM Complications: None  Hospital course: Onset of Labor With Vaginal Delivery      28 y.o. yo H4E6986 at [redacted]w[redacted]d was admitted in Active Labor on 03/22/2024. Labor course was complicated by quick delivery without time to achieve GBS prophylaxis.  Membrane Rupture Time/Date: 10:40 PM,03/22/2024  Delivery Method:Vaginal, Spontaneous Operative Delivery:N/A Episiotomy: None Lacerations:  None Patient had a postpartum course complicated by ***.  She is ambulating, tolerating a regular diet, passing flatus, and urinating well. Patient is discharged home in stable condition on 03/23/24.  Newborn Data: Birth date:03/22/2024 Birth time:11:22 PM Gender:Female Living status:Living Apgars:8 ,9  Weight:3118 g  Magnesium  Sulfate received: No BMZ received: No Rhophylac:No MMR:No T-DaP:Given prenatally Flu: No RSV Vaccine received: No Transfusion:{Transfusion received:30440034}  Immunizations received: Immunization History  Administered Date(s) Administered    Influenza,inj,Quad PF,6+ Mos 10/31/2017   Tdap 05/09/2021    Physical exam  Vitals:   03/23/24 0000 03/23/24 0015 03/23/24 0030 03/23/24 0048  BP: 120/78 120/78 113/78 122/64  Pulse: 71 74 75 70  Resp:      Temp:      TempSrc:      SpO2:      Weight:      Height:       General: {Exam; general:21111117} Lochia: {Desc; appropriate/inappropriate:30686::appropriate} Uterine Fundus: {Desc; firm/soft:30687} Incision: {Exam; incision:21111123} DVT Evaluation: {Exam; dvt:2111122} Labs: Lab Results  Component Value Date   WBC 8.2 03/22/2024   HGB 8.7 (L) 03/22/2024   HCT 30.2 (L) 03/22/2024   MCV 79.9 (L) 03/22/2024   PLT 148 (L) 03/22/2024      Latest Ref Rng & Units 01/03/2024    8:43 PM  CMP  Glucose 70 - 99 mg/dL 91   BUN 6 - 20 mg/dL 7   Creatinine 9.55 - 8.99 mg/dL 9.49   Sodium 864 - 854 mmol/L 136   Potassium 3.5 - 5.1 mmol/L 3.5   Chloride 98 - 111 mmol/L 105   CO2 22 - 32 mmol/L 23   Calcium  8.9 - 10.3 mg/dL 9.2   Total Protein 6.5 - 8.1 g/dL 6.7   Total Bilirubin 0.0 - 1.2 mg/dL 0.4   Alkaline Phos 38 - 126 U/L 49   AST 15 - 41 U/L 16   ALT 0 - 44 U/L 10    Edinburgh Score:    09/18/2021    4:14 PM  Edinburgh Postnatal Depression Scale Screening Tool  I have been  able to laugh and see the funny side of things. 2  I have looked forward with enjoyment to things. 3  I have blamed myself unnecessarily when things went wrong. 3  I have been anxious or worried for no good reason. 3  I have felt scared or panicky for no good reason. 3  Things have been getting on top of me. 3  I have been so unhappy that I have had difficulty sleeping. 3  I have felt sad or miserable. 3  I have been so unhappy that I have been crying. 3  The thought of harming myself has occurred to me. 0  Edinburgh Postnatal Depression Scale Total 26      Data saved with a previous flowsheet row definition   No data recorded  After visit meds:  Allergies as of 03/23/2024   No Known  Allergies   Med Rec must be completed prior to using this [facility]***        Discharge home in stable condition Infant Feeding: {Baby feeding:23562} Infant Disposition:{CHL IP OB HOME WITH FNUYZM:76418} Discharge instruction: per After Visit Summary and Postpartum booklet. Activity: Advance as tolerated. Pelvic rest for 6 weeks.  Diet: {OB ipzu:78888878} Future Appointments:No future appointments. Follow up Visit:  Message sent to [facility] 7/22  Please schedule this patient for a In person postpartum visit in 6 weeks with the following provider: [facility]. Additional Postpartum F/U: Postpartum Depression checkup  High risk pregnancy complicated by: OUD, hx CHF Delivery mode:  Vaginal, Spontaneous Anticipated Birth Control:  OCPs   03/23/2024 Almarie CHRISTELLA Moats, MD

## 2024-03-23 NOTE — Progress Notes (Signed)
 Postpartum progress note:  I called [facility] to elucidate dispense history for Methadone . She was last seen at [facility] in April 2025. This coincides with initiation of care with [facility]. She had been treated with Suboxone  8-2 QID and was stable on this regimen, however was not seen for follow up from 6/19 until her presentation to MAU yesterday for labor, however was seen in MAU on 7/5 for substance withdrawal x 4 days after [place]  [place]; had taken Roxicodone  x 2 tablets to prevent withdrawal, she was given a one week Rx of Suboxone  to bridge to f/up appt. Dr. Lola received a call from FOB on 7/15 noting concern that patient may be be using Fentanyl  again. She was next seen yesterday when she presented for labor.   Patient discussed with Dr. Lola, who recommends discontinuation of Methadone  at this time, will obtain ToxAssure, and Dr. Lola to see in AM to discuss further medication management.    Alain Sor, MD OB Fellow, Faculty Practice [facility], Center for [facility]

## 2024-03-23 NOTE — Lactation Note (Signed)
 This note was copied from a baby's chart. Lactation Consultation Note  Patient Name: Gabrielle Nguyen Today's Date: 03/23/2024 Age:30 hours Reason for consult: Initial assessment;Early term 59-38.6wks Mom and dad was sleeping when LC came in rm. Baby was in bassinet fussing. Dad woke up and LC introduced self and asked if it was time for baby to feed. Dad stated since she is crying I guess it is, we can try. Dad woke mom up. LC check baby's diaper, was dry. Baby cueing and re-positioned mom to more upright position. Baby latched but kept tongue thrusting nipple out. Mom has great everted nipples. Baby didn't appear to be able to hold it in her mouth. Mom tried a couple of positions and baby didn't appear to want to feed but just hold nipple in her mouth. Newborn feeding habits, STS, I&O, support while BF, body alignment reviewed. Mom encouraged to feed baby 8-12 times/24 hours and with feeding cues. Encouraged if baby hasn't cued for feeding in 3 hrs to wake baby and try to feed. Mom stated the baby BF great at last feeding for 20 min. And had no trouble latching. Baby will cue to feed but not open mouth wide. When she cries and open wide mom latched her. Baby would suckle a few times then stop and hold it in her mouth.  Encouraged mom to call for assistance as needed.  Maternal Data Has patient been taught Hand Expression?: Yes Does the patient have breastfeeding experience prior to this delivery?: Yes How long did the patient breastfeed?: 1st child 6 months, 2nd child 1 yr  Feeding    LATCH Score Latch: Repeated attempts needed to sustain latch, nipple held in mouth throughout feeding, stimulation needed to elicit sucking reflex.  Audible Swallowing: None  Type of Nipple: Everted at rest and after stimulation  Comfort (Breast/Nipple): Soft / non-tender  Hold (Positioning): Assistance needed to correctly position infant at breast and maintain latch.  LATCH Score: 6   Lactation  Tools Discussed/Used    Interventions Interventions: Breast feeding basics reviewed;Assisted with latch;Skin to skin;Breast massage;Hand express;Breast compression;Adjust position;Support pillows;Position options;Education;LC Services brochure;CDC milk storage guidelines  Discharge    Consult Status Consult Status: Follow-up Date: 03/23/24 Follow-up type: In-patient    Gabrielle Nguyen 03/23/2024, 3:52 AM

## 2024-03-24 ENCOUNTER — Other Ambulatory Visit (HOSPITAL_COMMUNITY): Payer: Self-pay

## 2024-03-24 LAB — IRON AND TIBC
Iron: 29 ug/dL (ref 28–170)
Saturation Ratios: 6 % — ABNORMAL LOW (ref 10.4–31.8)
TIBC: 480 ug/dL — ABNORMAL HIGH (ref 250–450)
UIBC: 451 ug/dL

## 2024-03-24 LAB — FERRITIN: Ferritin: 5 ng/mL — ABNORMAL LOW (ref 11–307)

## 2024-03-24 LAB — VITAMIN B12: Vitamin B-12: 223 pg/mL (ref 180–914)

## 2024-03-24 MED ORDER — NALOXONE HCL 4 MG/0.1ML NA LIQD
NASAL | 11 refills | Status: AC
  Filled 2024-03-24: qty 2, 30d supply, fill #0

## 2024-03-24 MED ORDER — POLYETHYLENE GLYCOL 3350 17 GM/SCOOP PO POWD
17.0000 g | Freq: Every day | ORAL | 1 refills | Status: DC | PRN
  Filled 2024-03-24: qty 238, 14d supply, fill #0

## 2024-03-24 MED ORDER — METHADONE HCL 10 MG PO TABS
80.0000 mg | ORAL_TABLET | Freq: Every day | ORAL | Status: DC
  Administered 2024-03-24: 80 mg via ORAL
  Filled 2024-03-24: qty 8

## 2024-03-24 MED ORDER — ACETAMINOPHEN 325 MG PO TABS
650.0000 mg | ORAL_TABLET | Freq: Four times a day (QID) | ORAL | 0 refills | Status: AC | PRN
  Filled 2024-03-24: qty 30, 4d supply, fill #0

## 2024-03-24 MED ORDER — IBUPROFEN 800 MG PO TABS
800.0000 mg | ORAL_TABLET | Freq: Three times a day (TID) | ORAL | 0 refills | Status: AC
  Filled 2024-03-24: qty 30, 10d supply, fill #0

## 2024-03-24 MED ORDER — NORGESTIMATE-ETH ESTRADIOL 0.25-35 MG-MCG PO TABS
1.0000 | ORAL_TABLET | Freq: Every day | ORAL | 3 refills | Status: DC
  Filled 2024-03-24: qty 28, 28d supply, fill #0

## 2024-03-24 MED ORDER — METOPROLOL SUCCINATE ER 25 MG PO TB24
25.0000 mg | ORAL_TABLET | Freq: Every evening | ORAL | 3 refills | Status: AC
  Filled 2024-03-24: qty 30, 30d supply, fill #0

## 2024-03-24 MED ORDER — METHADONE HCL 10 MG PO TABS: 80.0000 mg | ORAL_TABLET | Freq: Every day | ORAL | Status: DC

## 2024-03-24 NOTE — Clinical Social Work Maternal (Signed)
 CLINICAL SOCIAL WORK MATERNAL/CHILD NOTE  Patient Details  Name: Gabrielle Nguyen MRN: [id number] Date of Birth: 1995/09/18  Date:  03/24/2024  Clinical Social Worker Initiating Note:  Gabrielle Nguyen Date/Time: Initiated:  03/24/24/1435     Child's Name:  Gabrielle Nguyen   Biological Parents:  Mother, Father Gabrielle Nguyen Oct 29[birth year] 1993, Gabrielle Nguyen 07/22/1993)   Need for Interpreter:  None   Reason for Referral:  Current Substance Use/Substance Use During Pregnancy     Address:  [address] [place] [address]    Phone number:  [phone] (home)     Additional phone number:   Household Members/Support Persons (HM/SP):   Household Member/Support Person 1   HM/SP Name Relationship DOB or Age  HM/SP -1 Gabrielle Nguyen FOB 07/22/1993  HM/SP -2        HM/SP -3        HM/SP -4        HM/SP -5        HM/SP -6        HM/SP -7        HM/SP -8          Natural Supports (not living in the home):  Parent   Professional Supports: None   Employment: Unemployed   Type of Work:     Education:  Some Materials engineer arranged:    Surveyor, quantity Resources:  [employer]   Other Resources:  Sales executive  , [employer]   Cultural/Religious Considerations Which May Impact Care:    Strengths:  Ability to meet basic needs  , Psychotropic Medications, Pediatrician chosen   Psychotropic Medications:  Zoloft       Pediatrician:    Armed forces operational officer area  Optometrist List:   [employer]    [place] [facility]      Pediatrician Fax Number:    Risk Factors/Current Problems:  Substance Use     Cognitive State:  Able to Concentrate  , Alert     Mood/Affect:  Calm  , Comfortable     CSW Assessment: CSW received a consult for hx of substance use and Anxiety. CSW met with [name] to complete assessment and offer support. CSW entered the room and observed [name] resting in bed holding the infant. CSW  introduced self, CSW role and reason for visit, [name] was agreeable to visit. CSW inquired about how [name] was feeling, [name] reported good. CSW confirmed [name] demographic information [name] verified the information on file was correct.   CSW inquired about [name] MH hx, [name] reported she has Anxiety and is currently taking Zoloft  and Diazepam  to manage her symptoms. [name] reported she medication has been beneficial. CSW assessed for safety, [name] denied any SI or HI. CSW provided education regarding the baby blues period vs. perinatal mood disorders, discussed treatment and Nguyen resources for mental health follow up if concerns arise.  CSW recommends self-evaluation during the postpartum time period using the New Mom Checklist from Postpartum Progress and encouraged [name] to contact a medical professional if symptoms are noted at any time.  [name] identified FOB, her mom and FOB's mom as her primary supports.   CSW inquired about [name] substance use, [name] reported she is now being followed by the [facility] and has been clean since February, [name] reported she is taking her suboxone  as prescribed. CSW explained the hospital drug screen policy, [name] verbalized understanding. CSW informed [name]  infants UDS was positive for Benzodiazepines due to prescribed medication and THC. [name] reported she used THC to help with nausea. [name] reported she purchased it from the CBD store. CSW informed [name] CPS was notified of positive drug screen. CSW inquired about CPS hx, [name] reported none and reported her older children are with family and there was no CPS involvement in those decisions.  CSW was notified that a CPS report was made in the community. CPS SW Gabrielle Nguyen visited [name] yesterday and developed a safety plan, the infant has been cleared to discharge with [name].   CSW provided review of Sudden Infant Death Syndrome (SIDS) precautions.  [name] reported she has all necessary items fr the infant including a bassinet, crib and car seat. CSW  identifies no further need for intervention and no barriers to discharge at this time.   CSW Plan/Description:  No Further Intervention Required/No Barriers to Discharge, Sudden Infant Death Syndrome (SIDS) Education, Perinatal Mood and Anxiety Disorder (PMADs) Education, CSW Will Continue to Monitor Umbilical Cord Tissue Drug Screen Results and Make Report if Warranted, Child Protective Service Report  , Hospital Drug Screen Policy Information    Gabrielle Nguyen, [place] 03/24/2024, 2:39 PM

## 2024-03-24 NOTE — Lactation Note (Signed)
 This note was copied from a baby's chart. Lactation Consultation Note  Patient Name: Gabrielle Nguyen Date: 03/24/2024 Age:30 hours Reason for consult: Follow-up assessment;Maternal discharge;Early term 37-38.6wks  P3, 37 wks, @ 33 hrs of life. Infant showing feeding cues, mom recognizes- brings to breast. Infant irritable and takes hand expression, breast compression and a couple latches to settle onto the breast. Reassured mom this is very normal, suggestions offered- mom receptive to teaching. Infant working well with LC exit. Encouraged mom to keep working on big mouth latch with baby and use EBM or coconut oil after each feed. Discussed cluster feeding overnight/ early morning brings in our milk supply, shared expectations of milk coming in. Highlighted risk of engorgement. Discussed hand pump/express to soften breasts, motrin  as anti-inflammatory, and ice packs for 10-20 minutes post feed/pumping if still over-full is the best treatments for inflamed/engorged breasts. Mom provided hand pump, with [redacted] wk gestation had not yet complete insurance process for DEBP.   Maternal Data Has patient been taught Hand Expression?: Yes  Feeding Mother's Current Feeding Choice: Breast Milk  LATCH Score Latch: Grasps breast easily, tongue down, lips flanged, rhythmical sucking. (Baby has irritability, is able to settle onto breast)  Audible Swallowing: Spontaneous and intermittent  Type of Nipple: Everted at rest and after stimulation  Comfort (Breast/Nipple): Soft / non-tender  Hold (Positioning): Assistance needed to correctly position infant at breast and maintain latch.  LATCH Score: 9   Lactation Tools Discussed/Used    Interventions Interventions: Breast feeding basics reviewed;Assisted with latch;Hand express;Breast compression;Coconut oil;Hand pump;Education;LC Services brochure;CDC milk storage guidelines  Discharge Discharge Education: Engorgement and breast care Pump:  Manual;Personal  Consult Status Consult Status: Complete Date: 03/24/24 Follow-up type: In-patient    [facility] 03/24/2024, 8:34 AM

## 2024-03-24 NOTE — Patient Instructions (Signed)
 If interested in an outpatient lactation consult in office or virtually please reach out to us  at [facility] (First Floor) [address], [place] Please call [phone] and press 4 for lactation.   -- Lactation support groups:  [facility], Tuesdays 10:00 am -12:00 pm at [address] on the second floor in the conference room, lactating parents and lap babies welcome.  [email or web address]  [email or web address]     Vermell CINDERELLA Pelt, [facility] Center for [facility]

## 2024-03-25 ENCOUNTER — Inpatient Hospital Stay (HOSPITAL_COMMUNITY)
Admission: AD | Admit: 2024-03-25 | Discharge: 2024-03-25 | Disposition: A | Discharge status: Home / Self Care | Payer: MEDICAID | Attending: Obstetrics & Gynecology | Admitting: Obstetrics & Gynecology

## 2024-03-25 ENCOUNTER — Encounter (HOSPITAL_COMMUNITY): Payer: Self-pay | Admitting: Obstetrics & Gynecology

## 2024-03-25 ENCOUNTER — Other Ambulatory Visit: Payer: Self-pay

## 2024-03-25 ENCOUNTER — Encounter: Payer: Self-pay | Admitting: Family Medicine

## 2024-03-25 ENCOUNTER — Ambulatory Visit: Payer: Self-pay | Admitting: Obstetrics and Gynecology

## 2024-03-25 ENCOUNTER — Ambulatory Visit: Payer: MEDICAID | Admitting: Family Medicine

## 2024-03-25 VITALS — BP 131/89 | HR 102 | Wt 142.2 lb

## 2024-03-25 DIAGNOSIS — F1190 Opioid use, unspecified, uncomplicated: Secondary | ICD-10-CM

## 2024-03-25 DIAGNOSIS — Z87891 Personal history of nicotine dependence: Secondary | ICD-10-CM | POA: Insufficient documentation

## 2024-03-25 LAB — SURGICAL PATHOLOGY

## 2024-03-25 MED ORDER — METHADONE HCL 10 MG PO TABS
90.0000 mg | ORAL_TABLET | Freq: Once | ORAL | Status: AC
  Administered 2024-03-25: 90 mg via ORAL
  Filled 2024-03-25: qty 9

## 2024-03-25 MED ORDER — METHADONE HCL 10 MG PO TABS: 90.0000 mg | ORAL_TABLET | Freq: Every day | ORAL | Status: AC

## 2024-03-25 NOTE — MAU Note (Signed)
 Gabrielle Nguyen is a 27 y.o. at [redacted]w[redacted]d here in MAU reporting: sent to MAU by Dr. Lola to receive Methadone  dose.  Reports usually gets medication @ clinic, unable to receive med until MD available at clinic tomorrow.  S/P NVD 03/22/2024  LMP: NA Onset of complaint: today Pain score: 8 Vitals:   03/25/24 1507  BP: (!) 105/59  Pulse: 91  Resp: 19  Temp: 98.1 F (36.7 C)  SpO2: 100%     FHT: NA  Lab orders placed from triage: None

## 2024-03-25 NOTE — Assessment & Plan Note (Addendum)
 Kameo does not appear to be in any significant withdrawal. Has plan to have intake and dosing at [facility] tomorrow morning. Instructed to go to MAU for dose of methadone , given she seems to be doing well and is not sedated can go up to 90 mg. Spoke with MAU provider and reviewed plan.  Also reviewed rare but life threatening possibility of respiratory depression now that is entering days 3-5, reviewed that should she be unresponsive/not breathing her partner should administer narcan  and call 911.  Will leave UDS prior to leaving clinic Plan for close f/u in 2 weeks.
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
## 2024-03-26 NOTE — Lactation Note (Signed)
 This note was copied from a baby's chart. Lactation Consultation Note  Patient Name: Gabrielle Nguyen Date: 03/26/2024 Age:30 days Reason for consult: Follow-up assessment (Infant is ESC, weight loss -10.37 to -10.54% in past 24 hours.) see [name] MR   CO: 4 day old infant that is ESC, ETI with weight loss greater than 10%, slightly engorged   Per [name], infant is latching well at the breast, LC did not observe latch due to infant recently breastfeeding, infant breastfeed for 25 minutes and afterwards was given 35 mls of donor breast milk, infant was still cuing to feed when LC was in the room less than 1 hour, infant was given additional 20 mls of donor breast milk. [name] was slightly engorged, used ice and [name] expressed 10 mls of EBM that she will offer first at the next feeding and then  supplement remaining amounts with donor breast milk after latching infant at the breast. FOB changed a void diaper while in the room.   Current Feeding Plan -Day 4 1- [name] will limit infant's total feedings to 15 minutes at the breast and then immediately supplement infant with any pumped EBM and then donor breast milk. [name] knows to offer 30-35 mls of EBM/Donor Milk if infant latches at the breast and if infant does not latch [name] will offer 40 -60 mls of EBM/ donor milk. 2- [name] will limit total feedings to 30 minutes or less and not go past 3 hours without feeding infant. 3- [name] will continue to use the DEBP every 3 hours but if [name] is feeling breast fullness and not time to feed infant yet, [name] will pump to soften breast but not empty it and then latch infant at the next feeding.  4- LC discussed engorgement treatment and plan due to infant being 3 days old.   Maternal Data    Feeding Mother's Current Feeding Choice: Breast Milk and Donor Milk Nipple Type: Slow - flow  LATCH Score                    Lactation Tools Discussed/Used Tools: Pump;Flanges Flange Size: 27 Breast pump type:  Double-Electric Breast Pump Reason for Pumping: Infant with high weight loss Pumping frequency: [name] will start pumping every 3 hours for 15 minutes Pumped volume: 10 mL  Interventions    Discharge    Consult Status Consult Status: Follow-up Date: 03/26/24 Follow-up type: In-patient    Gabrielle Nguyen 03/26/2024, 5:02 PM

## 2024-03-27 NOTE — Lactation Note (Signed)
 This note was copied from a baby's chart. Lactation Consultation Note  Patient Name: Gabrielle Nguyen Date: 03/27/2024 Age:30 days  Reason for consult: Follow-up assessment;Other (Comment);Infant weight loss;Early term 37-38.6wks (Eat, Sleep, Console)  P3, [redacted]w[redacted]d, 9.4% weight loss (infant having weight increase form 10.5%), mother taking Methadone   Mother reports her milk volume is increasing. She expressed 35 ml today and her breast are feeling full. Mother is supplemented with formula, as needed. Mother has a manual pump and is getting an electric pump from her insurance.   Discussed management of engorgement, signs and symptom of mastitis and if occurs to report to patient's OB provider. Discussed Lactogenesis II/ supply and demand for maintaining milk supply.    Mother denies any questions or concerns. She has experience with breastfeeding her other children as babies.      Feeding Mother's Current Feeding Choice: Breast Milk and Formula  LATCH Score  Not observed, mostly feeding baby by bottle. Mother reports baby latches some.    Lactation Tools Discussed/Used Pumped volume: 35 mL   Discharge Discharge Education: Engorgement and breast care;Warning signs for feeding baby Pump: Personal;Manual  Consult Status Consult Status: Complete Date: 03/27/24    [name] 03/27/2024, 12:33 PM

## 2024-03-29 ENCOUNTER — Ambulatory Visit: Payer: Self-pay | Admitting: Family Medicine

## 2024-03-29 LAB — TOXASSURE FLEX 15, UR
6-ACETYLMORPHINE IA: NEGATIVE ng/mL
7-aminoclonazepam: NOT DETECTED ng/mg{creat}
AMPHETAMINES IA: NEGATIVE ng/mL
Alpha-hydroxyalprazolam: NOT DETECTED ng/mg{creat}
Alpha-hydroxymidazolam: NOT DETECTED ng/mg{creat}
Alpha-hydroxytriazolam: NOT DETECTED ng/mg{creat}
Alprazolam: NOT DETECTED ng/mg{creat}
BARBITURATES IA: NEGATIVE ng/mL
BUPRENORPHINE: NEGATIVE
Benzodiazepines: NEGATIVE
Buprenorphine: NOT DETECTED ng/mg{creat}
CANNABINOIDS IA: NEGATIVE ng/mL
COCAINE METABOLITE IA: NEGATIVE ng/mL
Clonazepam: NOT DETECTED ng/mg{creat}
Creatinine: 50 mg/dL (ref >=20)
Desalkylflurazepam: NOT DETECTED ng/mg{creat}
Desmethyldiazepam: NOT DETECTED ng/mg{creat}
Desmethylflunitrazepam: NOT DETECTED ng/mg{creat}
Diazepam: NOT DETECTED ng/mg{creat}
ETHYL ALCOHOL Enzymatic: NEGATIVE g/dL
FENTANYL: POSITIVE
Fentanyl: 4 ng/mg{creat}
Flunitrazepam: NOT DETECTED ng/mg{creat}
Lorazepam: NOT DETECTED ng/mg{creat}
Midazolam: NOT DETECTED ng/mg{creat}
Norbuprenorphine: NOT DETECTED ng/mg{creat}
Norfentanyl: 106 ng/mg{creat}
OPIATE CLASS IA: NEGATIVE ng/mL
OXYCODONE CLASS IA: NEGATIVE ng/mL
Oxazepam: NOT DETECTED ng/mg{creat}
PHENCYCLIDINE IA: NEGATIVE ng/mL
TAPENTADOL, IA: NEGATIVE ng/mL
TRAMADOL IA: NEGATIVE ng/mL
Temazepam: NOT DETECTED ng/mg{creat}

## 2024-03-29 LAB — METHADONE, MS, UR RFX
EDDP (Methadone Mtb): 10376 ng/mg{creat}
Methadone Confirmation: POSITIVE
Methadone: 1302 ng/mg{creat}

## 2024-04-08 ENCOUNTER — Telehealth (HOSPITAL_COMMUNITY): Payer: Self-pay | Admitting: *Deleted

## 2024-04-08 NOTE — Telephone Encounter (Signed)
 04/08/2024  Name: Gabrielle Nguyen MRN: [id number] DOB: [birth year]  Reason for Call:  Transition of Care Hospital Discharge Call  Contact Status: Patient Contact Status: Unable to contact (the number you have dialed is not in service)  Language assistant needed:          Follow-Up Questions:    Van Postnatal Depression Scale:  In the Past 7 Days:    PHQ2-9 Depression Scale:     Discharge Follow-up:    Post-discharge interventions: NA  Mliss Sieve, RN 04/08/2024 15:11

## 2024-04-13 ENCOUNTER — Ambulatory Visit: Payer: MEDICAID | Admitting: Family Medicine

## 2024-04-13 ENCOUNTER — Telehealth: Payer: Self-pay

## 2024-04-13 NOTE — Telephone Encounter (Signed)
 Called patient at both listed numbers to follow up on missed visit; neither number is in service. Will send MyChart message.

## 2024-05-04 ENCOUNTER — Encounter: Payer: Self-pay | Admitting: Advanced Practice Midwife

## 2024-05-04 ENCOUNTER — Ambulatory Visit: Payer: MEDICAID | Admitting: Advanced Practice Midwife

## 2024-05-04 VITALS — BP 113/74 | HR 91

## 2024-05-04 DIAGNOSIS — O924 Hypogalactia: Secondary | ICD-10-CM

## 2024-05-04 DIAGNOSIS — F418 Other specified anxiety disorders: Secondary | ICD-10-CM | POA: Diagnosis not present

## 2024-05-04 DIAGNOSIS — F4310 Post-traumatic stress disorder, unspecified: Secondary | ICD-10-CM

## 2024-05-04 DIAGNOSIS — F1190 Opioid use, unspecified, uncomplicated: Secondary | ICD-10-CM

## 2024-05-04 DIAGNOSIS — Z3041 Encounter for surveillance of contraceptive pills: Secondary | ICD-10-CM

## 2024-05-04 DIAGNOSIS — J45909 Unspecified asthma, uncomplicated: Secondary | ICD-10-CM

## 2024-05-04 DIAGNOSIS — O99013 Anemia complicating pregnancy, third trimester: Secondary | ICD-10-CM

## 2024-05-04 DIAGNOSIS — F1120 Opioid dependence, uncomplicated: Secondary | ICD-10-CM

## 2024-05-04 DIAGNOSIS — K5903 Drug induced constipation: Secondary | ICD-10-CM

## 2024-05-04 MED ORDER — PRAZOSIN HCL 2 MG PO CAPS
2.0000 mg | ORAL_CAPSULE | Freq: Every day | ORAL | 1 refills | Status: AC
Start: 2024-05-04 — End: ?

## 2024-05-04 MED ORDER — NORETHINDRONE 0.35 MG PO TABS
1.0000 | ORAL_TABLET | Freq: Every day | ORAL | 12 refills | Status: AC
Start: 2024-05-04 — End: ?

## 2024-05-04 MED ORDER — POLYETHYLENE GLYCOL 3350 17 GM/SCOOP PO POWD
17.0000 g | Freq: Two times a day (BID) | ORAL | 3 refills | Status: AC | PRN
Start: 2024-05-04 — End: ?

## 2024-05-04 MED ORDER — SERTRALINE HCL 100 MG PO TABS
200.0000 mg | ORAL_TABLET | Freq: Every day | ORAL | 1 refills | Status: AC
Start: 2024-05-04 — End: ?

## 2024-05-04 MED ORDER — METAMUCIL SMOOTH TEXTURE 58.6 % PO POWD
1.0000 | Freq: Three times a day (TID) | ORAL | 12 refills | Status: AC
Start: 2024-05-04 — End: ?
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
## 2024-05-04 NOTE — Progress Notes (Signed)
 Post Partum Visit Note  Gabrielle Nguyen is a 28 y.o. [phone] female who presents for a postpartum visit. She is 6 weeks postpartum following a normal spontaneous vaginal delivery.  I have fully reviewed the prenatal and intrapartum course. The delivery was at [redacted]w[redacted]d.  Anesthesia: epidural. Postpartum course has been uncomplicated. Baby is doing well. Baby is feeding by both breast and bottle - Nutramigen. Bleeding no bleeding. Bowel function is abnormal: constipation. Bladder function is normal. Patient is not sexually active. Contraception method is OCP (estrogen/progesterone ). Postpartum depression screening: negative.  Switched from Suboxone  to Methadone  managed by [name] and reports doing very well on 120 mg QD. Mood stable on Zoloft  200 mg QD.    Upstream - 05/04/24 1535       Pregnancy Intention Screening   Does the patient want to become pregnant in the next year? No    Does the patient's partner want to become pregnant in the next year? No    Would the patient like to discuss contraceptive options today? Yes      Contraception Wrap Up   Current Method Oral Contraceptive   OCP   End Method Oral Contraceptive   POP   Contraception Counseling Provided Yes    How was the end contraceptive method provided? Prescription         The pregnancy intention screening data noted above was reviewed. Potential methods of contraception were discussed. The patient elected to proceed with Oral Contraceptive (POP).   Edinburgh Postnatal Depression Scale - 05/04/24 1402       Edinburgh Postnatal Depression Scale:  In the Past 7 Days   I have been able to laugh and see the funny side of things. 0    I have looked forward with enjoyment to things. 0    I have blamed myself unnecessarily when things went wrong. 0    I have been anxious or worried for no good reason. 3    I have felt scared or panicky for no good reason. 3    Things have been getting on top of me. 0    I have been so  unhappy that I have had difficulty sleeping. 0    I have felt sad or miserable. 0    I have been so unhappy that I have been crying. 0    The thought of harming myself has occurred to me. 0    Edinburgh Postnatal Depression Scale Total 6          Health Maintenance Due  Topic Date Due   Pneumococcal Vaccine (1 of 2 - PCV) Never done   Hepatitis B Vaccines 19-59 Average Risk (1 of 3 - 19+ 3-dose series) Never done   Cervical Cancer Screening (Pap smear)  Never done   HPV VACCINES (1 - 3-dose SCDM series) Never done   INFLUENZA VACCINE  04/02/2024   COVID-19 Vaccine (1 - 2024-25 season) Never done    The following portions of the patient's history were reviewed and updated as appropriate: allergies, current medications, past family history, past medical history, past social history, past surgical history, and problem list.    Review of Systems Pertinent items are noted in HPI.  Objective:  BP 113/74   Pulse 91   Breastfeeding Yes    General:  Alert, cooperative, NAD   Breasts:  not indicated  Lungs: Normal rate and effort  Heart:  Normal rate  Abdomen: Soft, NT, Fundus non-palpable  Wound NA  GU exam:  Declined    03/26/24   RECEIVED FENT[facility]L  IN [facility] PAIN.   Assessment:    1. Anxiety with depression (Primary)--Stable on meds - sertraline  (ZOLOFT ) 100 MG tablet; Take 2 tablets (200 mg total) by mouth daily.  Dispense: 60 tablet; Refill: 1  2. Opioid use disorder - ToxAssure Flex 15, Ur  3. PTSD (post-traumatic stress disorder) - prazosin  (MINIPRESS ) 2 MG capsule; Take 1 capsule (2 mg total) by mouth at bedtime.  Dispense: 30 capsule; Refill: 1  4. Uncomplicated asthma, unspecified asthma severity, unspecified whether persistent  5. Drug-induced constipation - polyethylene glycol powder (GLYCOLAX /MIRALAX ) 17 GM/SCOOP powder; Take 17 g by mouth 2 (two) times daily as needed.  Dispense: 510 g; Refill: 3 - psyllium (METAMUCIL SMOOTH TEXTURE) 58.6 %  powder; Take 1 packet by mouth 3 (three) times daily. Start once a day. Increase to three times per day as needed.  Dispense: 283 g; Refill: 12  6. Oral contraceptive pill surveillance - norethindrone  (MICRONOR ) 0.35 MG tablet; Take 1 tablet (0.35 mg total) by mouth daily.  Dispense: 28 tablet; Refill: 12  7. Anemia during pregnancy in third trimester  8. Decreased lactation - Brief LC consult. Resources   9. Methadone  maintenance therapy patient (HCC) - Stable on Methadone . Managed by [name]. - ToxAssure  Nml  postpartum exam.   Plan:   Essential components of care per ACOG recommendations:  1.  Mood and well being: Patient with negative depression screening today. Reviewed local resources for support.  - Patient tobacco use? No.   - hx of drug use? Yes. Continue [facility] clinic until up to 1 year Postpartum. Stable on Methadone  managed by [name].   2. Infant care and feeding:  -Patient currently breastmilk feeding? Yes. Discussed returning to work and pumping. Reviewed importance of draining breast regularly to support lactation. Concern for decreased milk supply.   -Social determinants of health (SDOH) reviewed in EPIC. No concerns.   3. Sexuality, contraception and birth spacing - Patient does not want a pregnancy in the next year.  Desired family size is 3 children.  - Reviewed reproductive life planning. Reviewed contraceptive methods based on pt preferences and effectiveness.  Patient desired Oral Contraceptive today.   - Discussed birth spacing of 18 months  4. Sleep and fatigue -Encouraged family/partner/community support of 4 hrs of uninterrupted sleep to help with mood and fatigue  5. Physical Recovery  - Discussed patients delivery and complications. She describes her labor as good. - Patient had a Vaginal, no problems at delivery. Patient had a none laceration. Perineal healing reviewed. Patient expressed understanding - Patient has urinary incontinence?  No. - Patient is safe to resume physical and sexual activity  6.  Health Maintenance - HM due items addressed No - Overdue for Pap. Requests to return for Pap.  - Last pap smear No results found for: DIAGPAP Pap smear not done at today's visit.  -Breast Cancer screening indicated? No.   7. Chronic Disease/Pregnancy Condition follow up: cardiology, psychiatry  - PCP follow up  Tukker Byrns  Claudene, [facility] for [employer], [facility]

## 2024-05-04 NOTE — Patient Instructions (Signed)
 For Breastfeeding:   NOTHING WILL WORK UNLESS YOU ARE  - Emptying the breast adequately/completely - Emptying the breast regularly  AVOID PEPPERMINT AND SAGE  Supplement/Herb Purpose Dose Side Effects  Vitamin D Increase Vitamin D to infant, recommended for all breastfeeding moms and can use instead of supplementing infant 5,000 IU daily None  Go[place]ue  Can start at [redacted] weeks gestation to increase glandular tissue 500-1,000 mg 3-4 x a day Digestive issues  MilkWorks IR+ Whole Food Moringa, Myo Inositol + D-Chiro-Inositol and Beta Glucans Blend for Optimal Milk Production Whole food based supplement formulated to support optimal milk production by balancing prolacin, oxytocin , glucose, and insulin 1 capsule 3 x a day Digestive issues, headache, rash, low blood pressure, possible interaction with thyroid medications/ thyroid hormone levels  Legendairy Milk Supplements -PumpPrincess -Liquid Gold -Cash Cow -Milkapalooza  Combination of herbal galactogues  Per packaging (Rare) Digestive issues, headache, rash, low blood pressure, possible interaction with thyroid medications/ thyroid hormone levels  Lactation cookies/bars Food based galactogues/increase maternal calories Daily Digestive issues  Flax seeds Food based Galactogue Daily Digestive issues  Medical illustrator based Galactogue Daily Digestive issues  Hemp Hearts Food based Galactogue Daily Digestive issues  Oats Food based Galactogue Daily Digestive issues  Lecithin (Soy or Sunflower) Decreases the viscosity of milk, galactogue, fat emulsifier  Good for oversupply mothers to prevent clogged ducts 1200mg  three times per day Digestive issues  Rehydration drinks (Gatorade, Poweraid, Body Armor, Coconut Water, Liquid IV)   Improves maternal hydration and mammary glands are histologically similar to sweat glands Limit 1 a day None, caution use in patients with diabetes     Fenugreek *Use with extreme caution as this can decrease  milk supply in some especially those with thyroid disorders.   *Do not take if allergic to peanuts, chickpeas, soybeans, or other legumes*  *Do not take if sensitivity to maple syrup or if you have asthma*   Increases prolactin  1,200-1,800 mg 3 x a day Nausea, Loose stools and smelling like maple syrup  Reglan  Increases Prolactin Levels 10 Mg 3 times a day. Must obtain prescription from OB Sleepiness Increased anxiety/depression Tardive Dyskensia

## 2024-05-04 NOTE — Progress Notes (Signed)
 LC to room per [place]  CNM request. Gabrielle Nguyen shared milk production has decreased and wants tips to help boost. Gabrielle Nguyen is 70 weeks old. OP LC reviewed flange fit with pumping, latching tips, dream feeding, supply and demand, hydration, nourishment, rest, milk supply regulation, galactagogues/supplements and foods can take to help increase, and frequency. Measured nipples to size 16 mm and mom states has been using 27 mm flange. Reviewed tips for pumping and milk production and flange fit. CNM requested patient return in 2-3 weeks for follow up and patient Gabrielle Nguyen would like to see CNM and OP Lactation both in the same day. Front desk to book together. Reviewed Gabrielle Nguyen reaching out with any further questions or concerns. Follow up as needed. Vermell Pelt IBCLC
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
## 2024-05-07 ENCOUNTER — Encounter: Payer: Self-pay | Admitting: Advanced Practice Midwife

## 2024-05-07 DIAGNOSIS — F1120 Opioid dependence, uncomplicated: Secondary | ICD-10-CM | POA: Insufficient documentation

## 2024-05-08 LAB — TOXASSURE FLEX 15, UR
6-ACETYLMORPHINE IA: NEGATIVE ng/mL
7-aminoclonazepam: NOT DETECTED ng/mg{creat}
AMPHETAMINES IA: NEGATIVE ng/mL
Alpha-hydroxyalprazolam: NOT DETECTED ng/mg{creat}
Alpha-hydroxymidazolam: NOT DETECTED ng/mg{creat}
Alpha-hydroxytriazolam: NOT DETECTED ng/mg{creat}
Alprazolam: NOT DETECTED ng/mg{creat}
BARBITURATES IA: NEGATIVE ng/mL
BUPRENORPHINE: NEGATIVE
Benzodiazepines: NEGATIVE
Buprenorphine: NOT DETECTED ng/mg{creat}
COCAINE METABOLITE IA: NEGATIVE ng/mL
Clonazepam: NOT DETECTED ng/mg{creat}
Creatinine: 278 mg/dL (ref >=20)
Desalkylflurazepam: NOT DETECTED ng/mg{creat}
Desmethyldiazepam: NOT DETECTED ng/mg{creat}
Desmethylflunitrazepam: NOT DETECTED ng/mg{creat}
Diazepam: NOT DETECTED ng/mg{creat}
ETHYL ALCOHOL Enzymatic: NEGATIVE g/dL
FENTANYL: NEGATIVE
Fentanyl: NOT DETECTED ng/mg{creat}
Flunitrazepam: NOT DETECTED ng/mg{creat}
Lorazepam: NOT DETECTED ng/mg{creat}
Midazolam: NOT DETECTED ng/mg{creat}
Norbuprenorphine: NOT DETECTED ng/mg{creat}
Norfentanyl: NOT DETECTED ng/mg{creat}
OPIATE CLASS IA: NEGATIVE ng/mL
OXYCODONE CLASS IA: NEGATIVE ng/mL
Oxazepam: NOT DETECTED ng/mg{creat}
PHENCYCLIDINE IA: NEGATIVE ng/mL
TAPENTADOL, IA: NEGATIVE ng/mL
TRAMADOL IA: NEGATIVE ng/mL
Temazepam: NOT DETECTED ng/mg{creat}

## 2024-05-08 LAB — METHADONE, MS, UR RFX
EDDP (Methadone Mtb): >3597 ng/mg{creat}
Methadone Confirmation: POSITIVE
Methadone: >3597 ng/mg{creat}

## 2024-05-08 LAB — CANNABINOIDS, MS, UR RFX
Cannabinoids Confirmation: POSITIVE
Carboxy-THC: 8 ng/mg{creat}

## 2024-05-10 ENCOUNTER — Encounter: Payer: Self-pay | Admitting: Advanced Practice Midwife

## 2024-05-14 ENCOUNTER — Ambulatory Visit: Payer: Self-pay | Admitting: Advanced Practice Midwife

## 2024-05-17 ENCOUNTER — Telehealth: Payer: Self-pay | Admitting: Lactation Services

## 2024-05-17 NOTE — Telephone Encounter (Signed)
 LC called to remind lactating parent of baby [place] appointment on   Future Appointments  Date Time Provider Department Center  05/18/2024  2:00 PM [facility] CONSULTANT [facility]  05/18/2024  3:35 PM Lola Donnice HERO, MD [facility]    Parent answered? No Message was left No  Both numbers listed in chart not working. Will Send Mychart message.   LC reminded lactating parent to bring baby hungry, a bottle of milk, their pump, and a support person is always welcome. Reminded parent of contact information to call if anything changes [phone].    Reena GORMAN Forster, RN, [facility] Center for [facility]

## 2024-05-18 ENCOUNTER — Ambulatory Visit: Payer: MEDICAID | Admitting: Family Medicine
